# Patient Record
Sex: Male | Born: 1950
Health system: Southern US, Community
[De-identification: ages and names within clinical notes are randomized; demographics above are authoritative.]

## PROBLEM LIST (undated history)

## (undated) DIAGNOSIS — G709 Myoneural disorder, unspecified: Secondary | ICD-10-CM

## (undated) DIAGNOSIS — I493 Ventricular premature depolarization: Secondary | ICD-10-CM

## (undated) DIAGNOSIS — M1712 Unilateral primary osteoarthritis, left knee: Secondary | ICD-10-CM

## (undated) DIAGNOSIS — I10 Essential (primary) hypertension: Secondary | ICD-10-CM

## (undated) DIAGNOSIS — G43109 Migraine with aura, not intractable, without status migrainosus: Secondary | ICD-10-CM

## (undated) DIAGNOSIS — E785 Hyperlipidemia, unspecified: Secondary | ICD-10-CM

## (undated) DIAGNOSIS — G4730 Sleep apnea, unspecified: Secondary | ICD-10-CM

## (undated) DIAGNOSIS — M1990 Unspecified osteoarthritis, unspecified site: Secondary | ICD-10-CM

## (undated) DIAGNOSIS — D649 Anemia, unspecified: Secondary | ICD-10-CM

## (undated) DIAGNOSIS — I639 Cerebral infarction, unspecified: Secondary | ICD-10-CM

## (undated) DIAGNOSIS — M1612 Unilateral primary osteoarthritis, left hip: Secondary | ICD-10-CM

## (undated) DIAGNOSIS — J189 Pneumonia, unspecified organism: Secondary | ICD-10-CM

## (undated) DIAGNOSIS — M109 Gout, unspecified: Secondary | ICD-10-CM

## (undated) DIAGNOSIS — T7840XA Allergy, unspecified, initial encounter: Secondary | ICD-10-CM

## (undated) HISTORY — DX: Allergy, unspecified, initial encounter: T78.40XA

## (undated) HISTORY — PX: OTHER SURGICAL HISTORY: SHX169

## (undated) HISTORY — PX: LOOP RECORDER REMOVAL: EP1215

## (undated) HISTORY — DX: Essential (primary) hypertension: I10

## (undated) HISTORY — DX: Hyperlipidemia, unspecified: E78.5

## (undated) HISTORY — DX: Anemia, unspecified: D64.9

## (undated) HISTORY — PX: JOINT REPLACEMENT: SHX530

## (undated) HISTORY — PX: TONSILLECTOMY AND ADENOIDECTOMY: SUR1326

## (undated) HISTORY — DX: Cerebral infarction, unspecified: I63.9

## (undated) HISTORY — DX: Unspecified osteoarthritis, unspecified site: M19.90

## (undated) HISTORY — PX: WISDOM TOOTH EXTRACTION: SHX21

---

## 1995-05-03 HISTORY — PX: CHOLECYSTECTOMY: SHX55

## 2004-05-02 HISTORY — PX: OTHER SURGICAL HISTORY: SHX169

## 2007-09-28 ENCOUNTER — Ambulatory Visit (HOSPITAL_COMMUNITY): Admission: RE | Admit: 2007-09-28 | Discharge: 2007-09-28 | Payer: Self-pay | Admitting: Cardiology

## 2010-09-14 NOTE — Cardiovascular Report (Signed)
NAME:  Phillip [name], Phillip [name]                ACCOUNT NO.:  [id number]   MEDICAL RECORD NO.:  [id number]          PATIENT TYPE:  OIB   LOCATION:  2899                         FACILITY:  [facility]   PHYSICIAN:  Georga Hacking, M.D.DATE OF BIRTH:  Apr 10, 1951   DATE OF PROCEDURE:  09/28/2007  DATE OF DISCHARGE:                            CARDIAC CATHETERIZATION   HISTORY:  This is a 60 year old male with cardiac risk factors who has a  Cardiolite stress test without ischemia, but with some wide complex  rhythm at peak exercise.   PROCEDURES:  Left heart catheterization with coronary angiograms and  left ventriculogram.   PROCEDURE:  The patient tolerated the procedure well without  complications.  The right femoral artery was entered using a single  anterior needle wall stick and a 6-French sheath was placed.  The 6-  French catheters were used and a 30 mL ventriculogram was performed.  He  tolerated the procedure well and was returned to the holding area for  sheath removal following the procedure.   HEMODYNAMIC DATA:  Aorta postcontrast 116/72 and LV postcontrast 116/9-  16.   ANGIOGRAPHIC DATA:  Left ventriculogram:  Performed in the 30 degrees  RAO projection.  The aortic valve is normal.  The mitral valve is  normal.  The left ventricle appears normal in size.  Estimated ejection  fraction is 60%.  Coronary arteries arise and distribute normally.  No  significant coronary calcification noted, right dominant.  Left main  coronary artery normal.  Left anterior descending:  Mild aneurysmal dilation proximally with mild  10-20% proximal narrowing.  There are scattered narrowings proximally.  The distal vessel tapers somewhat, but contains no significant  obstructive disease.  Supplies a small diagonal branch.  Circumflex coronary artery:  Supplies several marginal branches and is  free of disease.  Right coronary artery:  Dominant vessel with no significant disease.  There are scattered  irregularities noted.   IMPRESSION:  1. Normal left ventricular function.  2. Mild proximal left anterior descending disease.  Scattered      irregularities in the right coronary artery.  No significant      obstructive coronary disease.   PLAN:  Continue risk factor modification followup and also placed on  beta-blocker therapy.      Georga Hacking, M.D.  Electronically Signed     WST/MEDQ  D:  09/28/2007  T:  09/28/2007  Job:  [id number]   cc:   Brett Canales A. Cleta Alberts, M.D.

## 2011-04-12 ENCOUNTER — Ambulatory Visit (INDEPENDENT_AMBULATORY_CARE_PROVIDER_SITE_OTHER): Payer: Commercial Managed Care - PPO

## 2011-04-12 DIAGNOSIS — J111 Influenza due to unidentified influenza virus with other respiratory manifestations: Secondary | ICD-10-CM

## 2011-06-01 ENCOUNTER — Ambulatory Visit (INDEPENDENT_AMBULATORY_CARE_PROVIDER_SITE_OTHER): Payer: Commercial Managed Care - PPO | Admitting: Physician Assistant

## 2011-06-01 VITALS — BP 162/92 | HR 73 | Temp 97.0°F | Resp 16 | Ht 74.0 in | Wt 242.0 lb

## 2011-06-01 DIAGNOSIS — S61409A Unspecified open wound of unspecified hand, initial encounter: Secondary | ICD-10-CM

## 2011-06-01 DIAGNOSIS — E1140 Type 2 diabetes mellitus with diabetic neuropathy, unspecified: Secondary | ICD-10-CM | POA: Insufficient documentation

## 2011-06-01 DIAGNOSIS — G4733 Obstructive sleep apnea (adult) (pediatric): Secondary | ICD-10-CM

## 2011-06-01 DIAGNOSIS — E119 Type 2 diabetes mellitus without complications: Secondary | ICD-10-CM

## 2011-06-01 DIAGNOSIS — M79609 Pain in unspecified limb: Secondary | ICD-10-CM

## 2011-06-01 DIAGNOSIS — I10 Essential (primary) hypertension: Secondary | ICD-10-CM

## 2011-06-01 MED ORDER — AMLODIPINE BESYLATE 5 MG PO TABS: 5.0000 mg | ORAL_TABLET | Freq: Every day | ORAL | Status: DC

## 2011-06-01 NOTE — Progress Notes (Signed)
  Subjective:    Patient ID: Phillip [name], male    DOB: 02-22-1951, 61 y.o.   MRN: [id number]  HPI 61 yo WM cut his left hand this morning cutting a piece of plastic.  He believes he has cut into the joint capsule, but notes he has full range of motion, good strength.  He irrigated the wound well with running water and notes it bled freely.  He presents with the wound wrapped in a clean washcloth.  He is right hand dominant.  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  Additionally, he notes that his BP has been running a little high and thinks his meds should be adjusted.   Review of Systems Wound and pain of the right hand, dorsal surface, over index finger MCP.    Objective:   Physical Exam  Constitutional: He appears well-developed and well-nourished.  HENT:  Head: Normocephalic and atraumatic.  Skin: Laceration noted.       Verbal consent obtained from the patient.  Local anesthesia with 1cc Lidocaine 2% without epinephrine.  Wound scrubbed with soap and water and rinsed.  Wound closed with 55-0 Ethilon SI sutures.  Wound cleansed and dressed.      Assessment & Plan:   1. Pain in limb   2. Open wound, hand   3. Essential hypertension, benign    Local wound care.  See patient instructions.  Continue current medications.  Will not increase beta blocker due to fatigue.  Start amlodipine 5 mg daily.  Monitor readings at home and RTC if BP consistently >140/90.  Patient aware of the beneficial effects of weight loss on hypertension.

## 2011-06-01 NOTE — Patient Instructions (Signed)

## 2011-06-24 ENCOUNTER — Telehealth: Payer: Self-pay

## 2011-06-24 MED ORDER — METOPROLOL TARTRATE 50 MG PO TABS: 75.0000 mg | ORAL_TABLET | Freq: Two times a day (BID) | ORAL | Status: DC

## 2011-06-24 NOTE — Telephone Encounter (Signed)
Called in attached Rx for Dr Alwyn Ren as written by Dr Cleta Alberts to [facility] outpt pharmacy. Dr Cleta Alberts, please sign order for Rx and close encounter

## 2011-06-29 ENCOUNTER — Other Ambulatory Visit: Payer: Self-pay | Admitting: Physician Assistant

## 2011-08-30 ENCOUNTER — Other Ambulatory Visit: Payer: Self-pay | Admitting: Emergency Medicine

## 2011-08-30 ENCOUNTER — Ambulatory Visit (INDEPENDENT_AMBULATORY_CARE_PROVIDER_SITE_OTHER): Payer: Commercial Managed Care - PPO | Admitting: Emergency Medicine

## 2011-08-30 DIAGNOSIS — I10 Essential (primary) hypertension: Secondary | ICD-10-CM

## 2011-08-30 DIAGNOSIS — E119 Type 2 diabetes mellitus without complications: Secondary | ICD-10-CM

## 2011-08-30 LAB — COMPREHENSIVE METABOLIC PANEL
ALT: 25 U/L (ref 0–53)
CO2: 28 mEq/L (ref 19–32)
Calcium: 9.8 mg/dL (ref 8.4–10.5)
Chloride: 100 mEq/L (ref 96–112)
Glucose, Bld: 132 mg/dL — ABNORMAL HIGH (ref 70–99)
Sodium: 138 mEq/L (ref 135–145)
Total Protein: 6.6 g/dL (ref 6.0–8.3)

## 2011-08-30 LAB — CBC WITH DIFFERENTIAL/PLATELET
Eosinophils Relative: 6 % — ABNORMAL HIGH (ref 0–5)
Hemoglobin: 12.8 g/dL — ABNORMAL LOW (ref 13.0–17.0)
Lymphocytes Relative: 34 % (ref 12–46)
Lymphs Abs: 2.7 10*3/uL (ref 0.7–4.0)
MCV: 92.4 fL (ref 78.0–100.0)
Monocytes Relative: 8 % (ref 3–12)
Platelets: 310 10*3/uL (ref 150–400)
RBC: 4.23 MIL/uL (ref 4.22–5.81)
WBC: 7.8 10*3/uL (ref 4.0–10.5)

## 2011-08-30 LAB — LIPID PANEL: Cholesterol: 184 mg/dL (ref 0–200)

## 2011-08-30 NOTE — Progress Notes (Signed)
  Subjective:    Patient ID: Phillip [name], male    DOB: [birth year], 61 y.o.   MRN: [id number]  HPI patient here for labs    Review of Systems     Objective:   Physical Exam        Assessment & Plan:

## 2011-09-05 ENCOUNTER — Ambulatory Visit: Payer: Commercial Managed Care - PPO

## 2011-09-05 ENCOUNTER — Ambulatory Visit (INDEPENDENT_AMBULATORY_CARE_PROVIDER_SITE_OTHER): Payer: Commercial Managed Care - PPO | Admitting: Emergency Medicine

## 2011-09-05 ENCOUNTER — Encounter: Payer: Self-pay | Admitting: Emergency Medicine

## 2011-09-05 VITALS — BP 150/90 | HR 55

## 2011-09-05 DIAGNOSIS — M549 Dorsalgia, unspecified: Secondary | ICD-10-CM

## 2011-09-05 DIAGNOSIS — Z0000 Encounter for general adult medical examination without abnormal findings: Secondary | ICD-10-CM

## 2011-09-05 DIAGNOSIS — M109 Gout, unspecified: Secondary | ICD-10-CM | POA: Insufficient documentation

## 2011-09-05 DIAGNOSIS — M25559 Pain in unspecified hip: Secondary | ICD-10-CM

## 2011-09-05 DIAGNOSIS — E782 Mixed hyperlipidemia: Secondary | ICD-10-CM

## 2011-09-05 DIAGNOSIS — M1990 Unspecified osteoarthritis, unspecified site: Secondary | ICD-10-CM | POA: Insufficient documentation

## 2011-09-05 DIAGNOSIS — M25569 Pain in unspecified knee: Secondary | ICD-10-CM

## 2011-09-05 DIAGNOSIS — M545 Low back pain: Secondary | ICD-10-CM

## 2011-09-05 DIAGNOSIS — E119 Type 2 diabetes mellitus without complications: Secondary | ICD-10-CM

## 2011-09-05 DIAGNOSIS — Z9989 Dependence on other enabling machines and devices: Secondary | ICD-10-CM

## 2011-09-05 DIAGNOSIS — R0602 Shortness of breath: Secondary | ICD-10-CM

## 2011-09-05 DIAGNOSIS — E781 Pure hyperglyceridemia: Secondary | ICD-10-CM | POA: Insufficient documentation

## 2011-09-05 DIAGNOSIS — R9431 Abnormal electrocardiogram [ECG] [EKG]: Secondary | ICD-10-CM

## 2011-09-05 DIAGNOSIS — E785 Hyperlipidemia, unspecified: Secondary | ICD-10-CM

## 2011-09-05 DIAGNOSIS — I10 Essential (primary) hypertension: Secondary | ICD-10-CM

## 2011-09-05 DIAGNOSIS — I493 Ventricular premature depolarization: Secondary | ICD-10-CM

## 2011-09-05 LAB — URIC ACID: Uric Acid, Serum: 5.5 mg/dL (ref 4.0–7.8)

## 2011-09-05 MED ORDER — FOLIC ACID 1 MG PO TABS: ORAL_TABLET | ORAL | Status: DC

## 2011-09-05 MED ORDER — ZOLPIDEM TARTRATE 5 MG PO TABS: 5.0000 mg | ORAL_TABLET | Freq: Every evening | ORAL | Status: DC | PRN

## 2011-09-05 MED ORDER — LOSARTAN POTASSIUM-HCTZ 100-12.5 MG PO TABS: 1.0000 | ORAL_TABLET | Freq: Every day | ORAL | Status: DC

## 2011-09-05 MED ORDER — ALLOPURINOL 300 MG PO TABS: 300.0000 mg | ORAL_TABLET | Freq: Every day | ORAL | Status: DC

## 2011-09-05 MED ORDER — METFORMIN HCL 1000 MG PO TABS: 1000.0000 mg | ORAL_TABLET | Freq: Two times a day (BID) | ORAL | Status: DC

## 2011-09-05 MED ORDER — METOPROLOL TARTRATE 50 MG PO TABS: 75.0000 mg | ORAL_TABLET | Freq: Two times a day (BID) | ORAL | Status: DC

## 2011-09-05 NOTE — Progress Notes (Signed)
  Subjective:    Patient ID: Phillip [name], male    DOB: August 14, 1950, 61 y.o.   MRN: [id number]  HPI patient enters with a number of issues he would like to address. Sugars have been ranging 120 to 130. To 190 he had an A1c done which was 6.4. He is working on weight loss but has been unable to exercise due to knee problems. His hypertension has been variable maximums 146/86. These have been worse since stopping amlodipine which was stopped due to gynecomastia. His gout has occasional flareups usually associated with dietary changes. He's had some major issues with his joints. He has persistent pain in his back as well as significant limited motion in his hips and left knee. Which has been giving him some discomfort. He has decreased sensation on the bottoms of both feet he is unsure whether this may be due to his back disease or whether this is due to his diabetes. He recently has had difficulty with gynecomastia. This has been presented to be secondary to medications.    Review of Systems are as listed in history of present illness     Objective:   Physical Exam  Constitutional: He is oriented to person, place, and time. He appears well-developed and well-nourished.  HENT:  Head: Normocephalic and atraumatic.  Right Ear: External ear normal.  Left Ear: External ear normal.  Eyes: Pupils are equal, round, and reactive to light.  Neck: Normal range of motion. Neck supple. No thyromegaly present.  Cardiovascular: Normal rate, regular rhythm, normal heart sounds and intact distal pulses.  Exam reveals no gallop and no friction rub.   No murmur heard. Pulmonary/Chest: No respiratory distress. He has no wheezes. He has no rales. He exhibits no tenderness.  Abdominal: Soft. Bowel sounds are normal. He exhibits no distension and no mass. There is no tenderness. There is no rebound and no guarding.  Musculoskeletal:       There is decreased range of motion of both hips. There is significant lack of  internal rotation. There are degenerative changes of the left knee. There is grinding present with McMurray testing but no locking and no instability is noted.  Neurological: He is alert and oriented to person, place, and time. He displays normal reflexes. No cranial nerve deficit. Coordination normal.       Decreased deep tendon reflexes both ankles no reflex right ankle trace left   Examination of feet revealed decreased sensation in the left foot compared to the right. There are hammertoe deformities bilaterally  EKG Q waves 3 and avf. No change [facility] reading (PRIMARY) by  Dr. Cleta Alberts Old comp l1 Deg changes. ? Mild spondylolisthesis L3 L4  Deg. Changes hips and knee. .     Assessment & Plan:  Assessment is diabetes under relatively good control. Blood pressure not under as good control as I would like to recent stoppage of amlodipine due to gynecomastia. We'll try folic acid to see if this helps with the neuropathy. Films ordered of the back hips

## 2011-10-03 ENCOUNTER — Encounter (INDEPENDENT_AMBULATORY_CARE_PROVIDER_SITE_OTHER): Payer: Self-pay | Admitting: General Surgery

## 2011-10-03 ENCOUNTER — Ambulatory Visit (INDEPENDENT_AMBULATORY_CARE_PROVIDER_SITE_OTHER): Payer: Commercial Managed Care - PPO | Admitting: General Surgery

## 2011-10-03 VITALS — BP 160/100 | HR 60 | Temp 97.6°F | Resp 14 | Ht 72.0 in | Wt 241.2 lb

## 2011-10-03 DIAGNOSIS — N644 Mastodynia: Secondary | ICD-10-CM

## 2011-10-03 NOTE — Progress Notes (Signed)
Subjective:     Patient ID: Phillip [name], male   DOB: July 14, 1950, 61 y.o.   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 He was on a trip to [place] where he wore his seatbelt and thought at that point we just related to that. He has a history of gynecomastia right side greater than left side. He was on amlodipine which he stopped about a month ago and there is been no improvement since then. He's had no discharge. He does not really feel a discrete mass. There is a point of tenderness just below his right nipple. This is been getting more tender lately. He comes in for evaluation.  Review of Systems  Constitutional: Negative for fever, chills and unexpected weight change.  HENT: Negative for hearing loss, congestion, sore throat, trouble swallowing and voice change.   Eyes: Negative for visual disturbance.  Respiratory: Negative for cough and wheezing.   Cardiovascular: Negative for chest pain, palpitations and leg swelling.  Gastrointestinal: Negative for nausea, vomiting, abdominal pain, diarrhea, constipation, blood in stool, abdominal distention, anal bleeding and rectal pain.  Genitourinary: Negative for hematuria and difficulty urinating.  Musculoskeletal: Negative for arthralgias.  Skin: Negative for rash and wound.  Neurological: Negative for seizures, syncope, weakness and headaches.  Hematological: Negative for adenopathy. Does not bruise/bleed easily.  Psychiatric/Behavioral: Negative for confusion.       Objective:   Physical Exam  Vitals reviewed. Constitutional: He appears well-developed and well-nourished.  Pulmonary/Chest: Right breast exhibits tenderness. Right breast exhibits no inverted nipple, no mass, no nipple discharge and no skin change. Left breast exhibits no inverted nipple, no mass, no nipple discharge, no skin change and no tenderness.    Lymphadenopathy:    He has no  axillary adenopathy.       Assessment:     Likely gynecomastia    Plan:        I think this is likely gynecomastia. He has however developed some symptoms referable to the right breast recently. There is a point of tenderness as well as a fair amount of breast tissue that is present and is not a subareolar in nature. I think with his symptoms the next best step is going to proceed with an ultrasound and I will follow up with him after that.

## 2011-10-06 ENCOUNTER — Other Ambulatory Visit: Payer: Self-pay | Admitting: Family Medicine

## 2011-10-06 MED ORDER — METOPROLOL TARTRATE 100 MG PO TABS: 100.0000 mg | ORAL_TABLET | Freq: Two times a day (BID) | ORAL | Status: DC

## 2011-10-10 ENCOUNTER — Ambulatory Visit
Admission: RE | Admit: 2011-10-10 | Discharge: 2011-10-10 | Disposition: A | Discharge status: Home / Self Care | Payer: 59 | Source: Ambulatory Visit | Attending: General Surgery | Admitting: General Surgery

## 2011-10-10 DIAGNOSIS — N644 Mastodynia: Secondary | ICD-10-CM

## 2011-10-11 ENCOUNTER — Other Ambulatory Visit: Payer: Self-pay | Admitting: Cardiology

## 2011-10-11 NOTE — Progress Notes (Signed)
Phillip [name]    Date of visit:  10/11/2011 DOB:  1950-12-06    Age:  60 yrs. Medical record number:  [id number]     Account number:  [id number] Primary Care Provider: Lesle Chris A ____________________________ CURRENT DIAGNOSES  1. CAD,Native  2. Obesity(BMI30-40)  3. Hypertension,Essential (Benign)  4. Arrhythmia-Ventricular Tachycardia  5. Sleep apnea  6. Diabetes Mellitus-NIDD ____________________________ ALLERGIES  Sulfonamides ____________________________ MEDICATIONS  1. aspirin 81 mg tablet, effervescent, 1 p.o. daily  2. ibuprofen 200 mg tablet, PRN  3. allopurinol 300 mg tablet, 1 p.o. daily  4. metformin 1,000 mg tablet, BID  5. metoprolol tartrate 100 mg tablet, BID  6. losartan-hydrochlorothiazide 100-12.5 mg tablet, 1 p.o. daily ____________________________ CHIEF COMPLAINTS  Followup of CAD,Native ____________________________ HISTORY OF PRESENT ILLNESS  Patient seen for cardiac followup. He returns after a long absence. When I last saw him he had a run of nonsustained ventricular tachycardia on treadmill testing and his beta blocker was increased. He never returned for a treadmill but has been doing well. 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 He denies angina and tries to walk about 2 miles per day. He denies PND, orthopnea, syncope, or claudication. He has had some mild edema as well as some burning in his feet and was taken off of amlodipine. He also complains of aching all over particularly with his neck as well as his knee and is no longer taking statin therapy because of the aching which he says is definitely worse. His diabetes has been under fair control. Blood pressure is borderline.  He continues to wear CPAP for sleep apnea. He uses ibuprofen intermittently for arthritis. ____________________________ PAST HISTORY  Past Medical Illnesses:  hypertension, DM-non-insulin dependent, obesity, history of migraine headaches, E. Coli  sepsis 1998 with acute cholecystitis, sleep apnea, gout;  Cardiovascular Illnesses:  RBBB, CAD;  Surgical Procedures:  cholecystectomy (lap), knee surgery, left, tonsillectomy, vasectomy;  Cardiology Procedures-Invasive:  no history of prior cardiac procedures;  Cardiology Procedures-Noninvasive:  stress echocardiogram, treadmill cardiolite May 2009, treadmill January 2010;  Cardiac Cath Results:  normal Left main, ecctatic proximal LAD, 20 % stenosis proximal LAD, normal CFX, scattered irregularities RCA;  ____________________________ CARDIO-PULMONARY TEST DATES EKG Date:  10/11/2011;   Cardiac Cath Date:  09/30/2007;  Nuclear Study Date:  09/12/2007;  Chest Xray Date: 09/20/2007;   ____________________________ SOCIAL HISTORY Alcohol Use:  no alcohol use;  Smoking:  never smoked;  Diet:  regular diet;  Lifestyle:  married and 4 children;  Exercise:  walking for approximately 45 minutes 6 days per week;  Occupation:  physician;  Residence:  lives with wife;   ____________________________ REVIEW OF SYSTEMS General:  obesity  Eyes:  wears eye glasses/contact lenses  Respiratory:  denies dyspnea  Cardiovascular:  please review HPI  Abdominal:  denies dyspepsia, GI bleeding, constipation, or diarrhea  Musculoskeletal:  mild arthritis of the knees, aching, neck pain  Neurological:  occasional migraine headaches ____________________________ PHYSICAL EXAMINATION VITAL SIGNS  Blood Pressure:  140/80 Sitting, Right arm, regular cuff  , 136/80 Standing, Right arm and regular cuff   Pulse:  74/min. Weight:  241.00 lbs. Height:  73"BMI: 32  Constitutional:  pleasant white male in no acute distress, mildly obese Skin:  warm and dry to touch, no apparent skin lesions, or masses noted. Head:  normocephalic, normal hair pattern, no masses or tenderness ENT:  ears, nose and throat reveal no gross abnormalities.  Dentition good. Neck:  supple, without massess. No JVD,  thyromegaly or carotid bruits. Carotid  upstroke normal. Chest:  normal symmetry, clear to auscultation and percussion. Cardiac:  regular rhythm, normal S1 and S2, No S3 or S4, no murmurs, gallops or rubs detected. Peripheral Pulses:  the femoral,dorsalis pedis, and posterior tibial pulses are full and equal bilaterally with no bruits auscultated. Extremities & Back:  no deformities, clubbing, cyanosis, erythema or edema observed. Normal muscle strength and tone. Neurological:  no gross motor or sensory deficits noted, affect appropriate, oriented x3. ____________________________ MOST RECENT LIPID PANEL 08/31/11  CHOL TOTL 184 mg/dl, LDL 85 calc, HDL 36 mg/dl, TRIGLYCER 161 mg/dl and CHOL/HDL 5.1 (Calc) ____________________________ IMPRESSIONS/PLAN  1. Mild coronary artery disease 2. History of nonsustained ventricular tachycardia suppressed on beta blocker therapy 3. Hypertension borderline control 4. Obesity with need to lose weight 5. Hyperlipidemia with statin intolerance  Recommendations:  We discussed need for carbohydrate restriction and additional weight loss. Suggested a repeat treadmill test to see whether he still has any evidence of wide complex tachycardia on an increased dose of beta blockers. Also gave samples of Trilipix to see if he would be effective control and hyperlipidemia without causing aching. He will have this followed up after he has beeno n it 6 weeks.EKG was normal today except for a left axis deviation. ____________________________ Christianne Dolin  1. 12 Lead EKG: Today  2. treadmill:  Regular TM At [facility]                       ____________________________ Cardiology Physician:  Darden Palmer MD [facility]

## 2011-10-12 ENCOUNTER — Telehealth (INDEPENDENT_AMBULATORY_CARE_PROVIDER_SITE_OTHER): Payer: Self-pay

## 2011-10-12 NOTE — Telephone Encounter (Signed)
LMOM for pt to call me so I can go over mgm results.

## 2011-10-13 ENCOUNTER — Telehealth (INDEPENDENT_AMBULATORY_CARE_PROVIDER_SITE_OTHER): Payer: Self-pay | Admitting: General Surgery

## 2011-10-13 NOTE — Telephone Encounter (Signed)
Patient aware test showed benign gynecomastia. Requesting a call from Dr Dwain Sarna to discuss. Declined appt.

## 2011-10-13 NOTE — Telephone Encounter (Signed)
Message copied by Liliana Cline on Thu Oct 13, 2011 11:43 AM ------      Message from: [place], [place]      Created: Tue Oct 11, 2011  9:56 PM       Elease Hashimoto,      I am happy to see him back if he wants to go over this or can just all him.  Let me know.  This all looks like benign gynecomastia.      Matt

## 2011-10-17 ENCOUNTER — Ambulatory Visit (INDEPENDENT_AMBULATORY_CARE_PROVIDER_SITE_OTHER): Payer: Commercial Managed Care - PPO | Admitting: Physician Assistant

## 2011-10-17 DIAGNOSIS — J029 Acute pharyngitis, unspecified: Secondary | ICD-10-CM

## 2011-10-17 NOTE — Progress Notes (Signed)
Patient ID: Phillip [name] MRN: [id number], DOB: 10/17/1950, 61 y.o. Date of Encounter: 10/17/2011, 2:29 PM  Primary Physician: Lucilla Edin, MD  Chief Complaint: No chief complaint on file.   HPI: 61 y.o. year old male presents with a one day history of sore throat. Sore throat has developed since being at work today. Feels that there may be some post nasal drip as well contributing to his sore throat. No congestion or cough. Multiple sick contacts at work. Able to swallow saliva, but hurts to do so. Decreased appetite secondary to sore throat.   Past Medical History  Diagnosis Date  . Anemia   . Arthritis   . Diabetes mellitus   . Hyperlipidemia   . Hypertension      Home Meds: Prior to Admission medications   Medication Sig Start Date End Date Taking? Authorizing Provider  acetaminophen (TYLENOL) 500 MG tablet Take 500 mg by mouth 1 day or 1 dose. 06/01/11   Historical Provider, MD  allopurinol (ZYLOPRIM) 300 MG tablet Take 1 tablet (300 mg total) by mouth daily. 09/05/11   Collene Gobble, MD  amLODipine (NORVASC) 5 MG tablet Take 1 tablet (5 mg total) by mouth daily. 06/01/11 05/31/12  Chelle S Jeffery, PA-C  aspirin 81 MG tablet Take 160 mg by mouth daily. 06/01/11   Historical Provider, MD  folic acid (FOLVITE) 1 MG tablet Take 2 tablets daily 09/05/11   Collene Gobble, MD  losartan-hydrochlorothiazide (HYZAAR) 100-12.5 MG per tablet Take 1 tablet by mouth daily. 09/05/11   Collene Gobble, MD  metFORMIN (GLUCOPHAGE) 1000 MG tablet Take 1 tablet (1,000 mg total) by mouth 2 (two) times daily with a meal. 09/05/11   Collene Gobble, MD  metoprolol (LOPRESSOR) 100 MG tablet Take 1 tablet (100 mg total) by mouth 2 (two) times daily. 10/06/11 10/05/12  Pattricia Boss, PA-C  zolpidem (AMBIEN) 5 MG tablet Take 1 tablet (5 mg total) by mouth at bedtime as needed. 09/05/11   Collene Gobble, MD    Allergies:  Allergies  Allergen Reactions  . Sulfa Antibiotics     History   Social History  .  Marital Status: Married    Spouse Name: N/A    Number of Children: N/A  . Years of Education: N/A   Occupational History  . Not on file.   Social History Main Topics  . Smoking status: Never Smoker   . Smokeless tobacco: Not on file  . Alcohol Use: No  . Drug Use: No  . Sexually Active: Not on file   Other Topics Concern  . Not on file   Social History Narrative  . No narrative on file      Physical Exam: There were no vitals taken for this visit., There is no height or weight on file to calculate BMI. General: Well developed, well nourished, in no acute distress. Head: Normocephalic, atraumatic, eyes without discharge, sclera non-icteric, nares are patent. Oral cavity moist, dentition normal. Posterior pharynx with post nasal drip and mild erythema. No peritonsillar abscess or tonsillar exudate. Neck: Supple. No thyromegaly. Full ROM. <2cm submandibular. Lungs: Clear bilaterally to auscultation without wheezes, rales, or rhonchi. Breathing is unlabored. Heart: RRR with S1 S2. No murmurs, rubs, or gallops appreciated. Msk:  Strength and tone normal for age. Extremities: No clubbing or cyanosis. No edema. Neuro: Alert and oriented X 3. Moves all extremities spontaneously. CNII-XII grossly in tact. Psych:  Responds to questions appropriately with a normal affect.  Labs: Results for orders placed in visit on 10/17/11  POCT RAPID STREP A (OFFICE)      Component Value Range   Rapid Strep A Screen Negative  Negative     ASSESSMENT AND PLAN:  61 y.o. year old male with viral pharyngitis -Supportive care -Tylenol/Motrin prn -Rest/fluids -RTC precautions -RTC 3-5 days if no improvement  SignedEula Listen, PA-C 10/17/2011 2:29 PM

## 2011-12-29 ENCOUNTER — Other Ambulatory Visit: Payer: Self-pay | Admitting: Family Medicine

## 2011-12-29 MED ORDER — METFORMIN HCL 1000 MG PO TABS: 1000.0000 mg | ORAL_TABLET | Freq: Two times a day (BID) | ORAL | Status: DC

## 2012-02-01 ENCOUNTER — Ambulatory Visit (INDEPENDENT_AMBULATORY_CARE_PROVIDER_SITE_OTHER): Payer: 59 | Admitting: Emergency Medicine

## 2012-02-01 VITALS — BP 156/93 | HR 64 | Temp 97.8°F | Resp 16

## 2012-02-01 DIAGNOSIS — I10 Essential (primary) hypertension: Secondary | ICD-10-CM

## 2012-02-01 DIAGNOSIS — G43909 Migraine, unspecified, not intractable, without status migrainosus: Secondary | ICD-10-CM

## 2012-02-01 DIAGNOSIS — Z23 Encounter for immunization: Secondary | ICD-10-CM

## 2012-02-01 DIAGNOSIS — E785 Hyperlipidemia, unspecified: Secondary | ICD-10-CM

## 2012-02-01 DIAGNOSIS — N529 Male erectile dysfunction, unspecified: Secondary | ICD-10-CM

## 2012-02-01 DIAGNOSIS — E119 Type 2 diabetes mellitus without complications: Secondary | ICD-10-CM

## 2012-02-01 LAB — COMPREHENSIVE METABOLIC PANEL
CO2: 28 mEq/L (ref 19–32)
Calcium: 10.1 mg/dL (ref 8.4–10.5)
Chloride: 98 mEq/L (ref 96–112)
Creat: 1.14 mg/dL (ref 0.50–1.35)
Glucose, Bld: 129 mg/dL — ABNORMAL HIGH (ref 70–99)
Sodium: 135 mEq/L (ref 135–145)
Total Bilirubin: 0.5 mg/dL (ref 0.3–1.2)
Total Protein: 7.4 g/dL (ref 6.0–8.3)

## 2012-02-01 LAB — LIPID PANEL
HDL: 42 mg/dL (ref >39)
Triglycerides: 171 mg/dL — ABNORMAL HIGH (ref <150)

## 2012-02-01 MED ORDER — CEFDINIR 300 MG PO CAPS: 600.0000 mg | ORAL_CAPSULE | Freq: Every day | ORAL | Status: DC

## 2012-02-01 MED ORDER — FLUTICASONE PROPIONATE 50 MCG/ACT NA SUSP: 2.0000 | Freq: Every day | NASAL | Status: DC

## 2012-02-01 MED ORDER — VARDENAFIL HCL 20 MG PO TABS: 20.0000 mg | ORAL_TABLET | Freq: Every day | ORAL | Status: DC | PRN

## 2012-02-01 NOTE — Progress Notes (Signed)
  Subjective:    Patient ID: Phillip [name], male    DOB: 08-17-1950, 61 y.o.   MRN: [id number]  HPI patient enters with chief complaint of followup of his diabetes. His sugars have been pretty reasonable in the morning. He has not been exercising much but has been trying to stick to his diet. He'll then see Dr. Carmelina Dane undergone a repeat stress test which did not reveal any tachycardia or PVCs when his heart rate increased . He has a history of migraines and has had intermittent visual migraines with all but no severe headaches. He's tried Maxalt with improvement recently. He recently has had some issues with ED and would like to try medications to see if that would help. He has had only recent rare flares of gout and takes an occasional nonsteroidal for this. He has had a dry cough but has had no fevers, no chills, no hemoptysis, no weight loss, no chills.    Review of Systems     Objective:   Physical Exam HEENT exam is unremarkable. Neck is supple. Chest clear to auscultation and percussion. Cardiac exam is unremarkable. Examination of feet revealed normal dorsalis pedis and posterior tibial.. sensation is intact        Results for orders placed in visit on 02/01/12  POCT GLYCOSYLATED HEMOGLOBIN (HGB A1C)      Component Value Range   Hemoglobin A1C 7.1     Assessment & Plan:  Hemoglobin A1c is slightly up from previous. We'll then check his labs including uric acid as well as testosterone levels. He was given his prescription for #3  20 mg Levitra to use as a trial if he continues to have cough with production we'll treat with Omnicef 300 twice a day.

## 2012-02-02 ENCOUNTER — Other Ambulatory Visit: Payer: Self-pay | Admitting: Emergency Medicine

## 2012-02-02 DIAGNOSIS — E781 Pure hyperglyceridemia: Secondary | ICD-10-CM

## 2012-02-02 LAB — MICROALBUMIN, URINE: Microalb, Ur: 0.5 mg/dL (ref 0.00–1.89)

## 2012-02-02 LAB — TESTOSTERONE, FREE, TOTAL, SHBG
Testosterone-% Free: 2.3 % (ref 1.6–2.9)
Testosterone: 225.99 ng/dL — ABNORMAL LOW (ref 300–890)

## 2012-02-02 MED ORDER — CHOLINE FENOFIBRATE 135 MG PO CPDR: 135.0000 mg | DELAYED_RELEASE_CAPSULE | Freq: Every day | ORAL | Status: DC

## 2012-03-08 ENCOUNTER — Telehealth: Payer: Self-pay | Admitting: Family Medicine

## 2012-03-08 DIAGNOSIS — B029 Zoster without complications: Secondary | ICD-10-CM

## 2012-03-08 MED ORDER — VALACYCLOVIR HCL 1 G PO TABS: 1000.0000 mg | ORAL_TABLET | Freq: Three times a day (TID) | ORAL | Status: DC

## 2012-03-08 NOTE — Telephone Encounter (Signed)
Phillip [name] developed shingles while in [place] a few days ago.  He had started an antiviral which was on hand but asked me to call in some valtrex.  Called in valtrex 1 gm TID for one week.

## 2012-04-06 ENCOUNTER — Other Ambulatory Visit: Payer: Self-pay | Admitting: Emergency Medicine

## 2012-04-06 ENCOUNTER — Other Ambulatory Visit: Payer: Self-pay | Admitting: Physician Assistant

## 2012-04-07 NOTE — Telephone Encounter (Signed)
Okay to refill Ambien 5 mg #90 with one refill.

## 2012-04-16 ENCOUNTER — Other Ambulatory Visit: Payer: Self-pay | Admitting: Radiology

## 2012-04-16 NOTE — Telephone Encounter (Signed)
Please advise on patients Ambien refill. Pended Rx

## 2012-04-17 ENCOUNTER — Other Ambulatory Visit: Payer: Self-pay | Admitting: Radiology

## 2012-04-17 MED ORDER — ZOLPIDEM TARTRATE 5 MG PO TABS: 5.0000 mg | ORAL_TABLET | Freq: Every evening | ORAL | Status: DC | PRN

## 2012-04-19 ENCOUNTER — Telehealth: Payer: Self-pay | Admitting: *Deleted

## 2012-04-19 NOTE — Telephone Encounter (Signed)
Pharmacy requesting refill on Ambien 5mg . Last refill on 01/26/2012.

## 2012-04-19 NOTE — Telephone Encounter (Signed)
Okay to refill Ambien for 6 months. We can give him a prescription for #90 with one refill

## 2012-04-19 NOTE — Telephone Encounter (Signed)
This has already been done.

## 2012-04-24 ENCOUNTER — Telehealth: Payer: Self-pay | Admitting: Physician Assistant

## 2012-04-24 MED ORDER — VARDENAFIL HCL 20 MG PO TABS: 20.0000 mg | ORAL_TABLET | Freq: Every day | ORAL | Status: DC | PRN

## 2012-04-24 NOTE — Telephone Encounter (Signed)
Has tried samples of Viagra and would like to try Levitra.  Dr. Cleta Alberts had given him an Rx previously, but he's lost it.  Sent Rx for Levitra 20 mg 1/2-1 PO QD prn, #6, RF x 3 (quantity specified by the patient).

## 2012-05-04 ENCOUNTER — Other Ambulatory Visit: Payer: Self-pay | Admitting: Physician Assistant

## 2012-05-04 MED ORDER — ALLOPURINOL 300 MG PO TABS: 300.0000 mg | ORAL_TABLET | Freq: Every day | ORAL | Status: DC

## 2012-05-25 ENCOUNTER — Ambulatory Visit (INDEPENDENT_AMBULATORY_CARE_PROVIDER_SITE_OTHER): Payer: 59 | Admitting: Emergency Medicine

## 2012-05-25 VITALS — BP 187/98 | HR 60 | Temp 97.7°F | Resp 16 | Ht 72.0 in | Wt 238.0 lb

## 2012-05-25 DIAGNOSIS — M1610 Unilateral primary osteoarthritis, unspecified hip: Secondary | ICD-10-CM

## 2012-05-25 DIAGNOSIS — E7800 Pure hypercholesterolemia, unspecified: Secondary | ICD-10-CM

## 2012-05-25 DIAGNOSIS — E119 Type 2 diabetes mellitus without complications: Secondary | ICD-10-CM

## 2012-05-25 DIAGNOSIS — I10 Essential (primary) hypertension: Secondary | ICD-10-CM

## 2012-05-25 DIAGNOSIS — M25569 Pain in unspecified knee: Secondary | ICD-10-CM

## 2012-05-25 LAB — COMPREHENSIVE METABOLIC PANEL
ALT: 23 U/L (ref 0–53)
AST: 18 U/L (ref 0–37)
BUN: 28 mg/dL — ABNORMAL HIGH (ref 6–23)
CO2: 26 mEq/L (ref 19–32)
Creat: 1.13 mg/dL (ref 0.50–1.35)
Total Bilirubin: 0.5 mg/dL (ref 0.3–1.2)

## 2012-05-25 LAB — POCT CBC
MCH, POC: 29.8 pg (ref 27–31.2)
MID (cbc): 0.5 (ref 0–0.9)
MPV: 8.6 fL (ref 0–99.8)
POC LYMPH PERCENT: 35.3 %L (ref 10–50)
POC MID %: 8.3 %M (ref 0–12)
Platelet Count, POC: 374 10*3/uL (ref 142–424)
RBC: 4.57 M/uL — AB (ref 4.69–6.13)
RDW, POC: 12.7 %
WBC: 6 10*3/uL (ref 4.6–10.2)

## 2012-05-25 LAB — LIPID PANEL
HDL: 40 mg/dL (ref >39)
LDL Cholesterol: 74 mg/dL (ref 0–99)
Triglycerides: 130 mg/dL (ref <150)
VLDL: 26 mg/dL (ref 0–40)

## 2012-05-25 MED ORDER — SAXAGLIPTIN HCL 5 MG PO TABS: 5.0000 mg | ORAL_TABLET | Freq: Every day | ORAL | Status: DC

## 2012-05-25 MED ORDER — LOSARTAN POTASSIUM-HCTZ 100-25 MG PO TABS: 1.0000 | ORAL_TABLET | Freq: Every day | ORAL | Status: DC

## 2012-05-25 NOTE — Progress Notes (Signed)
  Subjective:    Patient ID: NHIA HEAPHY, male    DOB: [birth year], 62 y.o.   MRN: [id number]  HPI patient in for followup of his diabetes. His blood pressure has also been elevated recently. He has had significant pain in his left hip and left knee. He has not followed through with his colonoscopy yet. He has not had any recent cardiac evaluations in the is a current patient of Dr. Tawana Scale    Review of Systems     Objective:   Physical Exam HEENT exam is unremarkable. Neck is supple. There are no carotid bruits. There are prominent bilateral submandibular glands. His chest is clear. Cardiac exam  no murmurs rubs or gallops. He does have 2-3 skip beats per minute. Abdomen is soft nontender. Extremity exam reveals mild decreased sensation to the toes dorsalis pedis posterior tibial pulses are 2+. Musculoskeletal exam reveals limited internal rotation right hip but extremely limited internal rotation of the left hip. There are also significant degenerative changes around the left knee.   Results for orders placed in visit on 05/25/12  POCT GLYCOSYLATED HEMOGLOBIN (HGB A1C)      Component Value Range   Hemoglobin A1C 7.6    POCT CBC      Component Value Range   WBC 6.0  4.6 - 10.2 K/uL   Lymph, poc 2.1  0.6 - 3.4   POC LYMPH PERCENT 35.3  10 - 50 %L   MID (cbc) 0.5  0 - 0.9   POC MID % 8.3  0 - 12 %M   POC Granulocyte 3.4  2 - 6.9   Granulocyte percent 56.4  37 - 80 %G   RBC 4.57 (*) 4.69 - 6.13 M/uL   Hemoglobin 13.6 (*) 14.1 - 18.1 g/dL   HCT, POC 40.9 (*) 81.1 - 53.7 %   MCV 94.1  80 - 97 fL   MCH, POC 29.8  27 - 31.2 pg   MCHC 31.6 (*) 31.8 - 35.4 g/dL   RDW, POC 91.4     Platelet Count, POC 374  142 - 424 K/uL   MPV 8.6  0 - 99.8 fL  GLUCOSE, POCT (MANUAL RESULT ENTRY)      Component Value Range   POC Glucose 164 (*) 70 - 99 mg/dl   Repeat blood pressure 154/92 left arm 156/90 right arm seated    Assessment & Plan:  The patient is not at goal we'll check with Dr. Arlyn Leak  about next step. Referral made to Dr. Thomasena Edis regarding a very limited motion in his left hip with pain in his left knee. He is going to schedule his own appointment for colonoscopy. Head and angularis 05 mg one a day to his metformin to see if we can get better diabetes control.

## 2012-06-24 ENCOUNTER — Other Ambulatory Visit: Payer: Self-pay | Admitting: *Deleted

## 2012-06-24 MED ORDER — SAXAGLIPTIN HCL 5 MG PO TABS: 5.0000 mg | ORAL_TABLET | Freq: Every day | ORAL | Status: DC

## 2012-07-18 ENCOUNTER — Encounter: Payer: Self-pay | Admitting: Emergency Medicine

## 2012-09-12 ENCOUNTER — Telehealth: Payer: Self-pay | Admitting: Family Medicine

## 2012-09-12 MED ORDER — TYPHOID VACCINE PO CPDR: DELAYED_RELEASE_CAPSULE | ORAL | Status: DC

## 2012-09-12 NOTE — Telephone Encounter (Signed)
Per Dr. Milus Glazier send in RX for Vivotif for overseas travel.

## 2012-09-18 ENCOUNTER — Other Ambulatory Visit: Payer: Self-pay | Admitting: Physician Assistant

## 2012-09-18 MED ORDER — DOXYCYCLINE HYCLATE 100 MG PO TABS: 100.0000 mg | ORAL_TABLET | Freq: Two times a day (BID) | ORAL | Status: DC

## 2012-09-18 NOTE — Telephone Encounter (Signed)
Pt called for refill on Metoprolol and Doxycycline for Malaria. Ok to send in per Dr Perrin Maltese.

## 2012-10-15 ENCOUNTER — Other Ambulatory Visit: Payer: Self-pay | Admitting: Family Medicine

## 2012-10-15 DIAGNOSIS — E785 Hyperlipidemia, unspecified: Secondary | ICD-10-CM

## 2012-10-15 DIAGNOSIS — Z79899 Other long term (current) drug therapy: Secondary | ICD-10-CM

## 2012-10-15 DIAGNOSIS — E119 Type 2 diabetes mellitus without complications: Secondary | ICD-10-CM

## 2012-10-16 ENCOUNTER — Encounter: Payer: Self-pay | Admitting: Cardiology

## 2012-10-16 NOTE — Progress Notes (Signed)
Patient ID: Phillip [name], male   DOB: [birth year], 62 y.o.   MRN: [id number]   Farhan, Jean    Date of visit:  10/15/2012 DOB:  [birth year]    Age:  62 yrs. Medical record number:  [id number]     Account number:  [id number] Primary Care Provider: Lesle Chris A ____________________________ CURRENT DIAGNOSES  1. CAD,Native  2. Obesity(BMI30-40)  3. Hypertension,Essential (Benign)  4. Diabetes Mellitus-NIDD  5. Arrhythmia-Ventricular Tachycardia  6. Palpitations  7. Sleep apnea ____________________________ ALLERGIES  Sulfa (Sulfonamides), Intolerance-unknown ____________________________ MEDICATIONS  1. ibuprofen 200 mg tablet, PRN  2. allopurinol 300 mg tablet, 1 p.o. daily  3. metformin 1,000 mg tablet, BID  4. metoprolol tartrate 100 mg tablet, BID  5. losartan-hydrochlorothiazide 100-12.5 mg tablet, 1 p.o. daily  6. Onglyza 5 mg tablet, 1 p.o. daily  7. doxycycline hyclate 100 mg tablet, 1 p.o. daily  8. fenofibrate 160 mg tablet, 1 p.o. daily  9. aspirin 81 mg tablet,chewable, 1 p.o. daily ____________________________ HISTORY OF PRESENT ILLNESS  Patient seen for cardiac followup. He is due to have left hip surgery as well as partial left knee replacement coming up. He has difficulty with range of movement in his hipas well as significant knee pain. He has noted more frequent PVCs recently and is aware of palpitations. He does not have any angina and denies PND, orthopnea, or claudication. He has not had lab work checked recently. He has been able to lose some weight since he was here. He wanted to have preoperative cardiac evaluation. He had a treadmill test last August and went 7 minutes and 40 seconds into Bruce stage III without ischemic changes and occasional PVCs and couplets were noted but no ventricular tachycardia was noted. ____________________________ PAST HISTORY  Past Medical Illnesses:  hypertension, DM-non-insulin dependent, obesity, history of migraine headaches, E. Coli  sepsis 1998 with acute cholecystitis, sleep apnea, gout;  Cardiovascular Illnesses:  RBBB, CAD;  Surgical Procedures:  cholecystectomy (lap), knee surgery, left, tonsillectomy, vasectomy;  Cardiology Procedures-Invasive:  no history of prior cardiac procedures;  Cardiology Procedures-Noninvasive:  stress echocardiogram, treadmill cardiolite May 2009, treadmill January 2010, treadmill August 2013, event monitor June 2014;  Cardiac Cath Results:  normal Left main, ecctatic proximal LAD, 20 % stenosis proximal LAD, normal CFX, scattered irregularities RCA;  60,   ____________________________ CARDIO-PULMONARY TEST DATES EKG Date:  10/15/2012;   Cardiac Cath Date:  09/30/2007;  Holter/Event Monitor Date: 10/15/2012;  Nuclear Study Date:  09/12/2007;  Chest Xray Date: 09/20/2007;   ____________________________ SOCIAL HISTORY Alcohol Use:  no alcohol use;  Smoking:  never smoked;  Diet:  regular diet;  Lifestyle:  married and 4 children;  Exercise:  walking for approximately 45 minutes 6 days per week;  Occupation:  physician;  Residence:  lives with wife;   ____________________________ REVIEW OF SYSTEMS General:  obesity Eyes: wears eye glasses/contact lenses Respiratory: denies dyspnea, cough, wheezing or hemoptysis. Cardiovascular:  please review HPI Abdominal: denies dyspepsia, GI bleeding, constipation, or diarrhea Musculoskeletal:  mild arthritis of the knees, aching, neck pain Neurological:  occasional migraine headaches  ____________________________ PHYSICAL EXAMINATION VITAL SIGNS  Blood Pressure:  132/80 Sitting, Left arm, regular cuff   Pulse:  76/min. Weight:  235.00 lbs. Height:  73"BMI: 31  Constitutional:  pleasant white male in no acute distress, mildly obese Skin:  warm and dry to touch, no apparent skin lesions, or masses noted. Head:  normocephalic, normal hair pattern, no masses or tenderness ENT:  ears, nose and throat reveal no  gross abnormalities.  Dentition good. Neck:  supple,  without massess. No JVD, thyromegaly or carotid bruits. Carotid upstroke normal. Chest:  normal symmetry, clear to auscultation and percussion. Cardiac:  regular rhythm, normal S1 and S2, No S3 or S4, no murmurs, gallops or rubs detected. Peripheral Pulses:  the femoral,dorsalis pedis, and posterior tibial pulses are full and equal bilaterally with no bruits auscultated. Extremities & Back:  no deformities, clubbing, cyanosis, erythema or edema observed. Normal muscle strength and tone. Neurological:  no gross motor or sensory deficits noted, affect appropriate, oriented x3. ____________________________ MOST RECENT LIPID PANEL 05/25/12  CHOL TOTL 140 mg/dl, LDL 74 calc, HDL 40 mg/dl, TRIGLYCER 366 mg/dl and CHOL/HDL 3.5 (Calc) ____________________________ IMPRESSIONS/PLAN  1. History of mild coronary artery disease 2. Premature ventricular contractions and history of mild nonsustained ventricular tachycardia with increased palpitations recently 3. Hypertension 4. Diabetes mellitus type 2 5. Hyperlipidemia with statin intolerance  Recommendations:  From a cardiac viewpoint I think it would be acceptable to proceed with the planned hip and knee surgery. Prior to this I would like him to wear a cardiac event monitor to evaluate the palpitations and the prior arrhythmia. He has stopped drinking ice tea and there are not any other real precipitating issues here. I would also like him to have an echocardiogram to evaluate his left ventricular function. He should continue to have his beta blockers administered during the perioperative period and following surgery. He should have prophylaxis for DVT following the surgery. I would be happy to see him with you at the time of surgery should the need arise. ____________________________ TODAYS ORDERS  1. 2D, color flow, doppler: First Available  2. [facility]: Today  3. 12 Lead EKG: Today                        ____________________________ Cardiology Physician:  Darden Palmer MD [facility]

## 2012-10-24 ENCOUNTER — Other Ambulatory Visit (INDEPENDENT_AMBULATORY_CARE_PROVIDER_SITE_OTHER): Payer: 59

## 2012-10-24 ENCOUNTER — Ambulatory Visit (INDEPENDENT_AMBULATORY_CARE_PROVIDER_SITE_OTHER): Payer: 59 | Admitting: Emergency Medicine

## 2012-10-24 ENCOUNTER — Other Ambulatory Visit: Payer: Self-pay | Admitting: Emergency Medicine

## 2012-10-24 VITALS — BP 159/93 | HR 62 | Temp 97.6°F | Resp 17 | Ht 72.5 in | Wt 238.0 lb

## 2012-10-24 DIAGNOSIS — I4949 Other premature depolarization: Secondary | ICD-10-CM

## 2012-10-24 DIAGNOSIS — G589 Mononeuropathy, unspecified: Secondary | ICD-10-CM

## 2012-10-24 DIAGNOSIS — G629 Polyneuropathy, unspecified: Secondary | ICD-10-CM

## 2012-10-24 DIAGNOSIS — I493 Ventricular premature depolarization: Secondary | ICD-10-CM

## 2012-10-24 DIAGNOSIS — E119 Type 2 diabetes mellitus without complications: Secondary | ICD-10-CM

## 2012-10-24 DIAGNOSIS — N529 Male erectile dysfunction, unspecified: Secondary | ICD-10-CM

## 2012-10-24 DIAGNOSIS — E785 Hyperlipidemia, unspecified: Secondary | ICD-10-CM

## 2012-10-24 DIAGNOSIS — Z79899 Other long term (current) drug therapy: Secondary | ICD-10-CM

## 2012-10-24 LAB — POCT CBC
Hemoglobin: 12.5 g/dL — AB (ref 14.1–18.1)
MCHC: 31.4 g/dL — AB (ref 31.8–35.4)
MID (cbc): 0.4 (ref 0–0.9)
MPV: 8.8 fL (ref 0–99.8)
POC Granulocyte: 3.8 (ref 2–6.9)
POC MID %: 7.6 %M (ref 0–12)
Platelet Count, POC: 320 10*3/uL (ref 142–424)
RBC: 4.19 M/uL — AB (ref 4.69–6.13)

## 2012-10-24 LAB — COMPREHENSIVE METABOLIC PANEL
AST: 17 U/L (ref 0–37)
Albumin: 4.1 g/dL (ref 3.5–5.2)
Alkaline Phosphatase: 33 U/L — ABNORMAL LOW (ref 39–117)
BUN: 27 mg/dL — ABNORMAL HIGH (ref 6–23)
Potassium: 4.1 mEq/L (ref 3.5–5.3)
Sodium: 140 mEq/L (ref 135–145)
Total Bilirubin: 0.4 mg/dL (ref 0.3–1.2)

## 2012-10-24 LAB — LIPID PANEL
HDL: 43 mg/dL (ref >39)
LDL Cholesterol: 63 mg/dL (ref 0–99)
VLDL: 23 mg/dL (ref 0–40)

## 2012-10-24 LAB — GLUCOSE, POCT (MANUAL RESULT ENTRY): POC Glucose: 160 mg/dl — AB (ref 70–99)

## 2012-10-24 MED ORDER — SILDENAFIL CITRATE 100 MG PO TABS: 50.0000 mg | ORAL_TABLET | Freq: Every day | ORAL | Status: DC | PRN

## 2012-10-24 NOTE — Progress Notes (Signed)
PATIENT HERE FOR LABS ONLY.

## 2012-10-24 NOTE — Progress Notes (Signed)
Subjective:    Patient ID: Phillip [name], male    DOB: 1950-06-12, 62 y.o.   MRN: [id number]  HPI patient enters for followup of his diabetes. He also is under the care of Dr. Donnie Aho  for his ventricular arrhythmia. He denies chest pain shortness of breath or any GI symptoms he currently uses binaural her ED. He is currently wearing a Holter monitor because of his problems with PVCs. In July he is scheduled for left total hip replacement as well as partial left knee replacement with Dr. Dion Saucier. Overall he is doing well he is trying to watch his diet. He is limited with exercise 2 to his knee and hip pain.    Review of Systems     Objective:   Physical Exam patient has a 1 by centimeter rough area on the top of the scalp. His pupils are equal and reactive to light. This are flat. Neck supple chest clear heart exam reveals frequent skipped beats runs of trigeminy and bigeminy. Soft liver spleen not enlarged. Extremities reveal 2+ swelling on the left 1+ on the right pulses are 2+ and symmetrical  Results for orders placed in visit on 10/24/12  COMPREHENSIVE METABOLIC PANEL      Result Value Range   Sodium 140  135 - 145 mEq/L   Potassium 4.1  3.5 - 5.3 mEq/L   Chloride 102  96 - 112 mEq/L   CO2 27  19 - 32 mEq/L   Glucose, Bld 142 (*) 70 - 99 mg/dL   BUN 27 (*) 6 - 23 mg/dL   Creat 2.84  1.32 - 4.40 mg/dL   Total Bilirubin 0.4  0.3 - 1.2 mg/dL   Alkaline Phosphatase 33 (*) 39 - 117 U/L   AST 17  0 - 37 U/L   ALT 24  0 - 53 U/L   Total Protein 6.8  6.0 - 8.3 g/dL   Albumin 4.1  3.5 - 5.2 g/dL   Calcium 9.9  8.4 - 10.2 mg/dL  LIPID PANEL      Result Value Range   Cholesterol 129  0 - 200 mg/dL   Triglycerides 725  <366 mg/dL   HDL 43  >44 mg/dL   Total CHOL/HDL Ratio 3.0     VLDL 23  0 - 40 mg/dL   LDL Cholesterol 63  0 - 99 mg/dL  POCT CBC      Result Value Range   WBC 5.9  4.6 - 10.2 K/uL   Lymph, poc 1.7  0.6 - 3.4   POC LYMPH PERCENT 28.7  10 - 50 %L   MID (cbc) 0.4  0 -  0.9   POC MID % 7.6  0 - 12 %M   POC Granulocyte 3.8  2 - 6.9   Granulocyte percent 63.7  37 - 80 %G   RBC 4.19 (*) 4.69 - 6.13 M/uL   Hemoglobin 12.5 (*) 14.1 - 18.1 g/dL   HCT, POC 03.4 (*) 74.2 - 53.7 %   MCV 94.9  80 - 97 fL   MCH, POC 29.8  27 - 31.2 pg   MCHC 31.4 (*) 31.8 - 35.4 g/dL   RDW, POC 59.5     Platelet Count, POC 320  142 - 424 K/uL   MPV 8.8  0 - 99.8 fL  GLUCOSE, POCT (MANUAL RESULT ENTRY)      Result Value Range   POC Glucose 160 (*) 70 - 99 mg/dl  POCT GLYCOSYLATED HEMOGLOBIN (HGB A1C)  Result Value Range   Hemoglobin A1C 7.0          Assessment & Plan:  Patient stable at present. He will need cardiac clearance prior to surgery. He has significant arthritis in the left hip and left knee. I refilled his Viagra other medications to remain the same.He is slightly low possibly from his xyloprim

## 2012-10-25 LAB — URIC ACID: Uric Acid, Serum: 4.7 mg/dL (ref 4.0–7.8)

## 2012-11-06 ENCOUNTER — Ambulatory Visit (HOSPITAL_COMMUNITY)
Admission: RE | Admit: 2012-11-06 | Discharge: 2012-11-06 | Disposition: A | Discharge status: Home / Self Care | Payer: 59 | Source: Ambulatory Visit | Attending: Anesthesiology | Admitting: Anesthesiology

## 2012-11-06 ENCOUNTER — Other Ambulatory Visit: Payer: Self-pay | Admitting: Orthopedic Surgery

## 2012-11-06 ENCOUNTER — Encounter (HOSPITAL_COMMUNITY): Payer: Self-pay | Admitting: Pharmacy Technician

## 2012-11-06 ENCOUNTER — Encounter (HOSPITAL_COMMUNITY): Payer: Self-pay

## 2012-11-06 ENCOUNTER — Encounter (HOSPITAL_COMMUNITY)
Admission: RE | Admit: 2012-11-06 | Discharge: 2012-11-06 | Disposition: A | Discharge status: Home / Self Care | Payer: 59 | Source: Ambulatory Visit | Attending: Orthopedic Surgery | Admitting: Orthopedic Surgery

## 2012-11-06 DIAGNOSIS — M4120 Other idiopathic scoliosis, site unspecified: Secondary | ICD-10-CM | POA: Insufficient documentation

## 2012-11-06 DIAGNOSIS — E119 Type 2 diabetes mellitus without complications: Secondary | ICD-10-CM | POA: Insufficient documentation

## 2012-11-06 DIAGNOSIS — Z01818 Encounter for other preprocedural examination: Secondary | ICD-10-CM | POA: Insufficient documentation

## 2012-11-06 DIAGNOSIS — Z01812 Encounter for preprocedural laboratory examination: Secondary | ICD-10-CM | POA: Insufficient documentation

## 2012-11-06 DIAGNOSIS — I10 Essential (primary) hypertension: Secondary | ICD-10-CM | POA: Insufficient documentation

## 2012-11-06 HISTORY — DX: Sleep apnea, unspecified: G47.30

## 2012-11-06 HISTORY — DX: Gout, unspecified: M10.9

## 2012-11-06 LAB — CBC
MCV: 88 fL (ref 78.0–100.0)
Platelets: 293 10*3/uL (ref 150–400)
RBC: 4.24 MIL/uL (ref 4.22–5.81)
WBC: 6.4 10*3/uL (ref 4.0–10.5)

## 2012-11-06 LAB — SURGICAL PCR SCREEN
MRSA, PCR: NEGATIVE
Staphylococcus aureus: NEGATIVE

## 2012-11-06 LAB — BASIC METABOLIC PANEL
CO2: 26 mEq/L (ref 19–32)
Calcium: 9.9 mg/dL (ref 8.4–10.5)
GFR calc Af Amer: 69 mL/min — ABNORMAL LOW (ref >90)
Sodium: 136 mEq/L (ref 135–145)

## 2012-11-06 LAB — PROTIME-INR
INR: 1.02 (ref 0.00–1.49)
Prothrombin Time: 13.2 seconds (ref 11.6–15.2)

## 2012-11-06 LAB — APTT: aPTT: 28 seconds (ref 24–37)

## 2012-11-06 NOTE — Progress Notes (Signed)
Requested EKG, Stress test from Dr. York Spaniel office.

## 2012-11-06 NOTE — Progress Notes (Signed)
Orders requested from Dr. Shelba Flake office.

## 2012-11-06 NOTE — Progress Notes (Signed)
Pt. Requested that T&S be done the DOS,he is going out of town to a conference and prefers not to wear the blood bracelet.

## 2012-11-06 NOTE — Pre-Procedure Instructions (Signed)
Phillip [name]  11/06/2012   Your procedure is scheduled on:  11-20-2012 @7 :30   Report to [facility] at 5:30 AM.  Call this number if you have problems the morning of surgery: [phone]   Remember:   Do not eat food or drink liquids after midnight.    Take these medicines the morning of surgery with A SIP OF WATER: allopurinol,flonase,fenofibrate,metoprolol              No diabetic medications the morning of surgery   Do not wear jewelry,  Do not wear lotions, powders, or perfumes.   Do not shave 48 hours prior to surgery. Men may shave face and neck.  Do not bring valuables to the hospital.  [facility] is not responsible for any belongings or valuables.  Contacts, dentures or bridgework may not be worn into surgery.  Leave suitcase in the car. After surgery it may be brought to your room.   For patients admitted to the hospital, checkout time is 11:00 AM the day of discharge.   Special Instructions: Shower using CHG 2 nights before surgery and the night before surgery.  If you shower the day of surgery use CHG.  Use special wash - you have one bottle of CHG for all showers.  You should use approximately 1/3 of the bottle for each shower.     Please read over the following fact sheets that you were given: Pain Booklet, Coughing and Deep Breathing, Blood Transfusion Information and Surgical Site Infection Prevention

## 2012-11-09 ENCOUNTER — Other Ambulatory Visit (HOSPITAL_COMMUNITY): Payer: Self-pay

## 2012-11-19 MED ORDER — CEFAZOLIN SODIUM-DEXTROSE 2-3 GM-% IV SOLR
2.0000 g | INTRAVENOUS | Status: AC
  Administered 2012-11-20 (×2): 2 g via INTRAVENOUS
  Filled 2012-11-19: qty 50

## 2012-11-20 ENCOUNTER — Encounter (HOSPITAL_COMMUNITY): Payer: Self-pay | Admitting: *Deleted

## 2012-11-20 ENCOUNTER — Encounter (HOSPITAL_COMMUNITY): Payer: Self-pay | Admitting: Anesthesiology

## 2012-11-20 ENCOUNTER — Encounter (HOSPITAL_COMMUNITY)
Admission: RE | Disposition: A | Discharge status: Home Health | Payer: Self-pay | Source: Ambulatory Visit | Attending: Orthopedic Surgery

## 2012-11-20 ENCOUNTER — Inpatient Hospital Stay (HOSPITAL_COMMUNITY): Payer: 59

## 2012-11-20 ENCOUNTER — Inpatient Hospital Stay (HOSPITAL_COMMUNITY)
Admission: RE | Admit: 2012-11-20 | Discharge: 2012-11-23 | DRG: 462 | Disposition: A | Discharge status: Home Health | Payer: 59 | Source: Ambulatory Visit | Attending: Orthopedic Surgery | Admitting: Orthopedic Surgery

## 2012-11-20 ENCOUNTER — Inpatient Hospital Stay (HOSPITAL_COMMUNITY): Payer: 59 | Admitting: Anesthesiology

## 2012-11-20 DIAGNOSIS — M109 Gout, unspecified: Secondary | ICD-10-CM | POA: Diagnosis present

## 2012-11-20 DIAGNOSIS — G4733 Obstructive sleep apnea (adult) (pediatric): Secondary | ICD-10-CM | POA: Diagnosis present

## 2012-11-20 DIAGNOSIS — I493 Ventricular premature depolarization: Secondary | ICD-10-CM | POA: Diagnosis present

## 2012-11-20 DIAGNOSIS — M1610 Unilateral primary osteoarthritis, unspecified hip: Principal | ICD-10-CM | POA: Diagnosis present

## 2012-11-20 DIAGNOSIS — M1710 Unilateral primary osteoarthritis, unspecified knee: Secondary | ICD-10-CM | POA: Diagnosis present

## 2012-11-20 DIAGNOSIS — I10 Essential (primary) hypertension: Secondary | ICD-10-CM | POA: Diagnosis present

## 2012-11-20 DIAGNOSIS — M169 Osteoarthritis of hip, unspecified: Principal | ICD-10-CM | POA: Diagnosis present

## 2012-11-20 DIAGNOSIS — Z79899 Other long term (current) drug therapy: Secondary | ICD-10-CM

## 2012-11-20 DIAGNOSIS — M1712 Unilateral primary osteoarthritis, left knee: Secondary | ICD-10-CM | POA: Diagnosis present

## 2012-11-20 DIAGNOSIS — E1140 Type 2 diabetes mellitus with diabetic neuropathy, unspecified: Secondary | ICD-10-CM | POA: Diagnosis present

## 2012-11-20 DIAGNOSIS — E119 Type 2 diabetes mellitus without complications: Secondary | ICD-10-CM | POA: Diagnosis present

## 2012-11-20 DIAGNOSIS — Z7982 Long term (current) use of aspirin: Secondary | ICD-10-CM

## 2012-11-20 DIAGNOSIS — M1612 Unilateral primary osteoarthritis, left hip: Secondary | ICD-10-CM

## 2012-11-20 DIAGNOSIS — E785 Hyperlipidemia, unspecified: Secondary | ICD-10-CM | POA: Diagnosis present

## 2012-11-20 HISTORY — DX: Unilateral primary osteoarthritis, left knee: M17.12

## 2012-11-20 HISTORY — PX: PARTIAL KNEE ARTHROPLASTY: SHX2174

## 2012-11-20 HISTORY — DX: Myoneural disorder, unspecified: G70.9

## 2012-11-20 HISTORY — DX: Unilateral primary osteoarthritis, left hip: M16.12

## 2012-11-20 HISTORY — PX: TOTAL HIP ARTHROPLASTY: SHX124

## 2012-11-20 HISTORY — PX: REPLACEMENT UNICONDYLAR JOINT KNEE: SUR1227

## 2012-11-20 LAB — GLUCOSE, CAPILLARY
Glucose-Capillary: 192 mg/dL — ABNORMAL HIGH (ref 70–99)
Glucose-Capillary: 183 mg/dL — ABNORMAL HIGH (ref 70–99)

## 2012-11-20 LAB — CBC
HCT: 33.5 % — ABNORMAL LOW (ref 39.0–52.0)
Hemoglobin: 11.3 g/dL — ABNORMAL LOW (ref 13.0–17.0)
MCHC: 33.7 g/dL (ref 30.0–36.0)
MCV: 89.8 fL (ref 78.0–100.0)
RDW: 12.6 % (ref 11.5–15.5)

## 2012-11-20 LAB — ABO/RH: ABO/RH(D): A POS

## 2012-11-20 SURGERY — ARTHROPLASTY, KNEE, UNICOMPARTMENTAL
Anesthesia: General | Site: Knee | Laterality: Left | Wound class: Clean

## 2012-11-20 MED ORDER — MEPERIDINE HCL 25 MG/ML IJ SOLN: 6.2500 mg | INTRAMUSCULAR | Status: DC | PRN

## 2012-11-20 MED ORDER — ACETAMINOPHEN 650 MG RE SUPP: 650.0000 mg | Freq: Four times a day (QID) | RECTAL | Status: DC | PRN

## 2012-11-20 MED ORDER — PHENOL 1.4 % MT LIQD: 1.0000 | OROMUCOSAL | Status: DC | PRN

## 2012-11-20 MED ORDER — FENTANYL CITRATE 0.05 MG/ML IJ SOLN
25.0000 ug | INTRAMUSCULAR | Status: DC | PRN
  Administered 2012-11-20: 50 ug via INTRAVENOUS

## 2012-11-20 MED ORDER — MIDAZOLAM HCL 5 MG/5ML IJ SOLN
INTRAMUSCULAR | Status: DC | PRN
  Administered 2012-11-20: 2 mg via INTRAVENOUS

## 2012-11-20 MED ORDER — FENTANYL CITRATE 0.05 MG/ML IJ SOLN
INTRAMUSCULAR | Status: AC
  Filled 2012-11-20: qty 2

## 2012-11-20 MED ORDER — LIDOCAINE HCL (CARDIAC) 20 MG/ML IV SOLN
INTRAVENOUS | Status: DC | PRN
  Administered 2012-11-20: 100 mg via INTRAVENOUS

## 2012-11-20 MED ORDER — DOCUSATE SODIUM 100 MG PO CAPS
100.0000 mg | ORAL_CAPSULE | Freq: Two times a day (BID) | ORAL | Status: DC
  Administered 2012-11-20 – 2012-11-23 (×7): 100 mg via ORAL
  Filled 2012-11-20 (×9): qty 1

## 2012-11-20 MED ORDER — FLUTICASONE PROPIONATE 50 MCG/ACT NA SUSP: 2.0000 | Freq: Every day | NASAL | Status: DC | PRN

## 2012-11-20 MED ORDER — POTASSIUM CHLORIDE IN NACL 20-0.45 MEQ/L-% IV SOLN
INTRAVENOUS | Status: DC
  Administered 2012-11-20 – 2012-11-22 (×2): via INTRAVENOUS
  Filled 2012-11-20 (×5): qty 1000

## 2012-11-20 MED ORDER — BUPIVACAINE HCL (PF) 0.25 % IJ SOLN
INTRAMUSCULAR | Status: DC | PRN
  Administered 2012-11-20: 10 mL

## 2012-11-20 MED ORDER — EPHEDRINE SULFATE 50 MG/ML IJ SOLN
INTRAMUSCULAR | Status: DC | PRN
  Administered 2012-11-20 (×3): 5 mg via INTRAVENOUS

## 2012-11-20 MED ORDER — GLYCOPYRROLATE 0.2 MG/ML IJ SOLN
INTRAMUSCULAR | Status: DC | PRN
  Administered 2012-11-20: 0.2 mg via INTRAVENOUS
  Administered 2012-11-20: .4 mg via INTRAVENOUS

## 2012-11-20 MED ORDER — MIDAZOLAM HCL 2 MG/2ML IJ SOLN: 0.5000 mg | Freq: Once | INTRAMUSCULAR | Status: DC | PRN

## 2012-11-20 MED ORDER — MAGNESIUM CITRATE PO SOLN
1.0000 | Freq: Once | ORAL | Status: AC | PRN
  Filled 2012-11-20: qty 296

## 2012-11-20 MED ORDER — ALLOPURINOL 300 MG PO TABS
300.0000 mg | ORAL_TABLET | Freq: Every day | ORAL | Status: DC
  Administered 2012-11-21 – 2012-11-23 (×3): 300 mg via ORAL
  Filled 2012-11-20 (×3): qty 1

## 2012-11-20 MED ORDER — ONDANSETRON HCL 4 MG PO TABS: 4.0000 mg | ORAL_TABLET | Freq: Four times a day (QID) | ORAL | Status: DC | PRN

## 2012-11-20 MED ORDER — OXYCODONE HCL 5 MG PO TABS
5.0000 mg | ORAL_TABLET | ORAL | Status: DC | PRN
  Administered 2012-11-20 – 2012-11-23 (×12): 10 mg via ORAL
  Administered 2012-11-23 (×2): 5 mg via ORAL
  Filled 2012-11-20 (×9): qty 2
  Filled 2012-11-20: qty 1
  Filled 2012-11-20 (×4): qty 2

## 2012-11-20 MED ORDER — BUPIVACAINE HCL (PF) 0.25 % IJ SOLN
INTRAMUSCULAR | Status: AC
  Filled 2012-11-20: qty 60

## 2012-11-20 MED ORDER — SENNA 8.6 MG PO TABS
1.0000 | ORAL_TABLET | Freq: Two times a day (BID) | ORAL | Status: DC
  Administered 2012-11-20 – 2012-11-23 (×5): 8.6 mg via ORAL
  Filled 2012-11-20 (×9): qty 1

## 2012-11-20 MED ORDER — METHOCARBAMOL 500 MG PO TABS
500.0000 mg | ORAL_TABLET | Freq: Four times a day (QID) | ORAL | Status: DC | PRN
  Administered 2012-11-20 – 2012-11-23 (×8): 500 mg via ORAL
  Filled 2012-11-20 (×7): qty 1

## 2012-11-20 MED ORDER — MENTHOL 3 MG MT LOZG: 1.0000 | LOZENGE | OROMUCOSAL | Status: DC | PRN

## 2012-11-20 MED ORDER — NEOSTIGMINE METHYLSULFATE 1 MG/ML IJ SOLN
INTRAMUSCULAR | Status: DC | PRN
  Administered 2012-11-20: 2 mg via INTRAVENOUS

## 2012-11-20 MED ORDER — FENTANYL CITRATE 0.05 MG/ML IJ SOLN
INTRAMUSCULAR | Status: DC | PRN
  Administered 2012-11-20 (×5): 50 ug via INTRAVENOUS
  Administered 2012-11-20 (×2): 100 ug via INTRAVENOUS
  Administered 2012-11-20 (×3): 50 ug via INTRAVENOUS

## 2012-11-20 MED ORDER — HYDROMORPHONE HCL PF 1 MG/ML IJ SOLN
1.0000 mg | INTRAMUSCULAR | Status: DC | PRN
  Administered 2012-11-20 (×3): 1 mg via INTRAVENOUS
  Filled 2012-11-20 (×3): qty 1

## 2012-11-20 MED ORDER — METHOCARBAMOL 500 MG PO TABS
ORAL_TABLET | ORAL | Status: AC
  Administered 2012-11-20: 500 mg
  Filled 2012-11-20: qty 1

## 2012-11-20 MED ORDER — LOSARTAN POTASSIUM 50 MG PO TABS
100.0000 mg | ORAL_TABLET | Freq: Every day | ORAL | Status: DC
  Administered 2012-11-20 – 2012-11-23 (×4): 100 mg via ORAL
  Filled 2012-11-20 (×4): qty 2

## 2012-11-20 MED ORDER — FENOFIBRATE 160 MG PO TABS
160.0000 mg | ORAL_TABLET | Freq: Every day | ORAL | Status: DC
  Administered 2012-11-21 – 2012-11-23 (×3): 160 mg via ORAL
  Filled 2012-11-20 (×3): qty 1

## 2012-11-20 MED ORDER — ENOXAPARIN SODIUM 40 MG/0.4ML ~~LOC~~ SOLN: 40.0000 mg | SUBCUTANEOUS | Status: DC

## 2012-11-20 MED ORDER — INSULIN ASPART 100 UNIT/ML ~~LOC~~ SOLN
0.0000 [IU] | Freq: Three times a day (TID) | SUBCUTANEOUS | Status: DC
  Administered 2012-11-20 – 2012-11-21 (×4): 3 [IU] via SUBCUTANEOUS
  Administered 2012-11-22: 5 [IU] via SUBCUTANEOUS
  Administered 2012-11-22: 2 [IU] via SUBCUTANEOUS
  Administered 2012-11-22: 3 [IU] via SUBCUTANEOUS
  Administered 2012-11-23: 5 [IU] via SUBCUTANEOUS

## 2012-11-20 MED ORDER — LOSARTAN POTASSIUM-HCTZ 100-25 MG PO TABS: 1.0000 | ORAL_TABLET | Freq: Every day | ORAL | Status: DC

## 2012-11-20 MED ORDER — KETOROLAC TROMETHAMINE 30 MG/ML IJ SOLN
INTRAMUSCULAR | Status: AC
  Filled 2012-11-20: qty 1

## 2012-11-20 MED ORDER — PROMETHAZINE HCL 25 MG/ML IJ SOLN: 6.2500 mg | INTRAMUSCULAR | Status: DC | PRN

## 2012-11-20 MED ORDER — BISACODYL 10 MG RE SUPP: 10.0000 mg | Freq: Every day | RECTAL | Status: DC | PRN

## 2012-11-20 MED ORDER — METHOCARBAMOL 100 MG/ML IJ SOLN
500.0000 mg | Freq: Four times a day (QID) | INTRAVENOUS | Status: DC | PRN
  Filled 2012-11-20: qty 5

## 2012-11-20 MED ORDER — ZOLPIDEM TARTRATE 5 MG PO TABS: 5.0000 mg | ORAL_TABLET | Freq: Every evening | ORAL | Status: DC | PRN

## 2012-11-20 MED ORDER — OXYCODONE-ACETAMINOPHEN 10-325 MG PO TABS: 1.0000 | ORAL_TABLET | Freq: Four times a day (QID) | ORAL | Status: DC | PRN

## 2012-11-20 MED ORDER — LINAGLIPTIN 5 MG PO TABS
5.0000 mg | ORAL_TABLET | Freq: Every day | ORAL | Status: DC
  Administered 2012-11-21 – 2012-11-23 (×3): 5 mg via ORAL
  Filled 2012-11-20 (×3): qty 1

## 2012-11-20 MED ORDER — ROCURONIUM BROMIDE 100 MG/10ML IV SOLN
INTRAVENOUS | Status: DC | PRN
  Administered 2012-11-20: 50 mg via INTRAVENOUS

## 2012-11-20 MED ORDER — METHOCARBAMOL 500 MG PO TABS: 500.0000 mg | ORAL_TABLET | Freq: Four times a day (QID) | ORAL | Status: DC

## 2012-11-20 MED ORDER — DIPHENHYDRAMINE HCL 12.5 MG/5ML PO ELIX: 12.5000 mg | ORAL_SOLUTION | ORAL | Status: DC | PRN

## 2012-11-20 MED ORDER — PROPOFOL 10 MG/ML IV BOLUS
INTRAVENOUS | Status: DC | PRN
  Administered 2012-11-20: 200 mg via INTRAVENOUS

## 2012-11-20 MED ORDER — ACETAMINOPHEN 325 MG PO TABS: 650.0000 mg | ORAL_TABLET | Freq: Four times a day (QID) | ORAL | Status: DC | PRN

## 2012-11-20 MED ORDER — KETOROLAC TROMETHAMINE 15 MG/ML IJ SOLN
15.0000 mg | Freq: Four times a day (QID) | INTRAMUSCULAR | Status: AC
  Administered 2012-11-20 – 2012-11-21 (×4): 15 mg via INTRAVENOUS
  Filled 2012-11-20 (×4): qty 1

## 2012-11-20 MED ORDER — 0.9 % SODIUM CHLORIDE (POUR BTL) OPTIME
TOPICAL | Status: DC | PRN
  Administered 2012-11-20: 1000 mL

## 2012-11-20 MED ORDER — ONDANSETRON HCL 4 MG/2ML IJ SOLN
INTRAMUSCULAR | Status: DC | PRN
  Administered 2012-11-20: 4 mg via INTRAVENOUS

## 2012-11-20 MED ORDER — METOCLOPRAMIDE HCL 5 MG/ML IJ SOLN
5.0000 mg | Freq: Three times a day (TID) | INTRAMUSCULAR | Status: DC | PRN
  Administered 2012-11-20: 5 mg via INTRAVENOUS
  Filled 2012-11-20: qty 2

## 2012-11-20 MED ORDER — ALUM & MAG HYDROXIDE-SIMETH 200-200-20 MG/5ML PO SUSP: 30.0000 mL | ORAL | Status: DC | PRN

## 2012-11-20 MED ORDER — ONDANSETRON HCL 4 MG/2ML IJ SOLN
4.0000 mg | Freq: Four times a day (QID) | INTRAMUSCULAR | Status: DC | PRN
  Administered 2012-11-22: 4 mg via INTRAVENOUS
  Filled 2012-11-20: qty 2

## 2012-11-20 MED ORDER — HYDROCHLOROTHIAZIDE 25 MG PO TABS
25.0000 mg | ORAL_TABLET | Freq: Every day | ORAL | Status: DC
  Administered 2012-11-20 – 2012-11-23 (×4): 25 mg via ORAL
  Filled 2012-11-20 (×4): qty 1

## 2012-11-20 MED ORDER — KETOROLAC TROMETHAMINE 30 MG/ML IJ SOLN
15.0000 mg | Freq: Once | INTRAMUSCULAR | Status: AC | PRN
  Administered 2012-11-20: 30 mg via INTRAVENOUS

## 2012-11-20 MED ORDER — METOPROLOL TARTRATE 100 MG PO TABS
100.0000 mg | ORAL_TABLET | Freq: Two times a day (BID) | ORAL | Status: DC
  Administered 2012-11-20 – 2012-11-23 (×6): 100 mg via ORAL
  Filled 2012-11-20 (×7): qty 1

## 2012-11-20 MED ORDER — METOCLOPRAMIDE HCL 10 MG PO TABS: 5.0000 mg | ORAL_TABLET | Freq: Three times a day (TID) | ORAL | Status: DC | PRN

## 2012-11-20 MED ORDER — PROMETHAZINE HCL 25 MG PO TABS: 25.0000 mg | ORAL_TABLET | Freq: Four times a day (QID) | ORAL | Status: DC | PRN

## 2012-11-20 MED ORDER — SODIUM CHLORIDE 0.9 % IR SOLN
Status: DC | PRN
  Administered 2012-11-20: 1000 mL

## 2012-11-20 MED ORDER — ASPIRIN 81 MG PO TABS: 81.0000 mg | ORAL_TABLET | Freq: Every day | ORAL | Status: DC

## 2012-11-20 MED ORDER — CEFAZOLIN SODIUM-DEXTROSE 2-3 GM-% IV SOLR
2.0000 g | Freq: Four times a day (QID) | INTRAVENOUS | Status: AC
  Administered 2012-11-20 (×2): 2 g via INTRAVENOUS
  Filled 2012-11-20 (×2): qty 50

## 2012-11-20 MED ORDER — ASPIRIN EC 81 MG PO TBEC
81.0000 mg | DELAYED_RELEASE_TABLET | Freq: Every day | ORAL | Status: DC
  Administered 2012-11-20 – 2012-11-23 (×4): 81 mg via ORAL
  Filled 2012-11-20 (×4): qty 1

## 2012-11-20 MED ORDER — ONDANSETRON HCL 4 MG/2ML IJ SOLN
INTRAMUSCULAR | Status: AC
  Filled 2012-11-20: qty 2

## 2012-11-20 MED ORDER — ENOXAPARIN SODIUM 40 MG/0.4ML ~~LOC~~ SOLN
40.0000 mg | SUBCUTANEOUS | Status: DC
  Administered 2012-11-21 – 2012-11-23 (×3): 40 mg via SUBCUTANEOUS
  Filled 2012-11-20 (×5): qty 0.4

## 2012-11-20 MED ORDER — ARTIFICIAL TEARS OP OINT
TOPICAL_OINTMENT | OPHTHALMIC | Status: DC | PRN
  Administered 2012-11-20: 1 via OPHTHALMIC

## 2012-11-20 MED ORDER — LACTATED RINGERS IV SOLN
INTRAVENOUS | Status: DC | PRN
  Administered 2012-11-20 (×3): via INTRAVENOUS

## 2012-11-20 MED ORDER — POLYETHYLENE GLYCOL 3350 17 G PO PACK: 17.0000 g | PACK | Freq: Every day | ORAL | Status: DC | PRN

## 2012-11-20 SURGICAL SUPPLY — 90 items
BANDAGE ELASTIC 6 VELCRO ST LF (GAUZE/BANDAGES/DRESSINGS) ×3 IMPLANT
BANDAGE ESMARK 6X9 LF (GAUZE/BANDAGES/DRESSINGS) ×2 IMPLANT
BENZOIN TINCTURE PRP APPL 2/3 (GAUZE/BANDAGES/DRESSINGS) ×3 IMPLANT
BLADE SAW RECIP 87.9 MT (BLADE) ×3 IMPLANT
BLADE SAW SAG 73X25 THK (BLADE) ×1
BLADE SAW SGTL 73X25 THK (BLADE) ×2 IMPLANT
BNDG ESMARK 6X9 LF (GAUZE/BANDAGES/DRESSINGS) ×3
BOOTCOVER CLEANROOM LRG (PROTECTIVE WEAR) ×6 IMPLANT
BOWL SMART MIX CTS (DISPOSABLE) ×6 IMPLANT
BRUSH FEMORAL CANAL (MISCELLANEOUS) IMPLANT
CAPT HIP PF MOP ×3 IMPLANT
CAPT KNEE OXFORD ×3 IMPLANT
CEMENT HV SMART SET (Cement) ×6 IMPLANT
CLOSURE STERI-STRIP 1/4X4 (GAUZE/BANDAGES/DRESSINGS) ×3 IMPLANT
CLOTH BEACON ORANGE TIMEOUT ST (SAFETY) ×3 IMPLANT
CLSR STERI-STRIP ANTIMIC 1/2X4 (GAUZE/BANDAGES/DRESSINGS) ×3 IMPLANT
COVER BACK TABLE 24X17X13 BIG (DRAPES) IMPLANT
COVER SURGICAL LIGHT HANDLE (MISCELLANEOUS) ×3 IMPLANT
CUFF TOURNIQUET SINGLE 34IN LL (TOURNIQUET CUFF) ×3 IMPLANT
DRAPE EXTREMITY T 121X128X90 (DRAPE) ×3 IMPLANT
DRAPE INCISE IOBAN 66X45 STRL (DRAPES) ×6 IMPLANT
DRAPE ORTHO SPLIT 77X108 STRL (DRAPES) ×2
DRAPE PROXIMA HALF (DRAPES) ×3 IMPLANT
DRAPE SURG ORHT 6 SPLT 77X108 (DRAPES) ×4 IMPLANT
DRAPE U-SHAPE 47X51 STRL (DRAPES) ×6 IMPLANT
DRILL BIT 5/64 (BIT) ×3 IMPLANT
DRSG MEPILEX BORDER 4X12 (GAUZE/BANDAGES/DRESSINGS) IMPLANT
DRSG MEPILEX BORDER 4X8 (GAUZE/BANDAGES/DRESSINGS) ×3 IMPLANT
DRSG PAD ABDOMINAL 8X10 ST (GAUZE/BANDAGES/DRESSINGS) ×3 IMPLANT
DURAPREP 26ML APPLICATOR (WOUND CARE) ×6 IMPLANT
ELECT BLADE 4.0 EZ CLEAN MEGAD (MISCELLANEOUS) ×3
ELECT CAUTERY BLADE 6.4 (BLADE) ×3 IMPLANT
ELECT REM PT RETURN 9FT ADLT (ELECTROSURGICAL)
ELECTRODE BLDE 4.0 EZ CLN MEGD (MISCELLANEOUS) ×2 IMPLANT
ELECTRODE REM PT RTRN 9FT ADLT (ELECTROSURGICAL) IMPLANT
EVACUATOR 1/8 PVC DRAIN (DRAIN) IMPLANT
FACESHIELD LNG OPTICON STERILE (SAFETY) ×3 IMPLANT
GLOVE BIOGEL M 7.0 STRL (GLOVE) ×6 IMPLANT
GLOVE BIOGEL PI IND STRL 7.5 (GLOVE) ×4 IMPLANT
GLOVE BIOGEL PI INDICATOR 7.5 (GLOVE) ×2
GLOVE BIOGEL PI ORTHO PRO SZ8 (GLOVE) ×2
GLOVE ORTHO TXT STRL SZ7.5 (GLOVE) ×6 IMPLANT
GLOVE PI ORTHO PRO STRL SZ8 (GLOVE) ×4 IMPLANT
GLOVE SURG ORTHO 8.0 STRL STRW (GLOVE) ×6 IMPLANT
GOWN STRL NON-REIN LRG LVL3 (GOWN DISPOSABLE) IMPLANT
HANDPIECE INTERPULSE COAX TIP (DISPOSABLE) ×1
HOOD PEEL AWAY FACE SHEILD DIS (HOOD) ×6 IMPLANT
KIT BASIN OR (CUSTOM PROCEDURE TRAY) ×3 IMPLANT
KIT ROOM TURNOVER OR (KITS) ×3 IMPLANT
KIT SAW BLADE (KITS) ×3 IMPLANT
MANIFOLD NEPTUNE II (INSTRUMENTS) ×3 IMPLANT
MARKER SKIN DUAL TIP RULER LAB (MISCELLANEOUS) ×3 IMPLANT
NDL SUT 2 .5 CRC MAYO 1.732X (NEEDLE) ×2 IMPLANT
NEEDLE HYPO 25GX1X1/2 BEV (NEEDLE) ×3 IMPLANT
NEEDLE MAYO TAPER (NEEDLE) ×1
NS IRRIG 1000ML POUR BTL (IV SOLUTION) ×3 IMPLANT
PACK TOTAL JOINT (CUSTOM PROCEDURE TRAY) ×3 IMPLANT
PAD ARMBOARD 7.5X6 YLW CONV (MISCELLANEOUS) ×6 IMPLANT
PAD CAST 4YDX4 CTTN HI CHSV (CAST SUPPLIES) IMPLANT
PAD SHARPS MAGNETIC DISPOSAL (MISCELLANEOUS) ×3 IMPLANT
PADDING CAST COTTON 4X4 STRL (CAST SUPPLIES)
PADDING CAST COTTON 6X4 STRL (CAST SUPPLIES) IMPLANT
PILLOW ABDUCTION HIP (SOFTGOODS) ×3 IMPLANT
PRESSURIZER FEMORAL UNIV (MISCELLANEOUS) IMPLANT
RETRIEVER SUT HEWSON (MISCELLANEOUS) ×3 IMPLANT
RUBBERBAND STERILE (MISCELLANEOUS) ×3 IMPLANT
SET HNDPC FAN SPRY TIP SCT (DISPOSABLE) ×2 IMPLANT
SPONGE GAUZE 4X4 12PLY (GAUZE/BANDAGES/DRESSINGS) ×3 IMPLANT
SPONGE LAP 4X18 X RAY DECT (DISPOSABLE) IMPLANT
STAPLER VISISTAT 35W (STAPLE) ×3 IMPLANT
STRIP CLOSURE SKIN 1/2X4 (GAUZE/BANDAGES/DRESSINGS) IMPLANT
SUCTION FRAZIER TIP 10 FR DISP (SUCTIONS) ×3 IMPLANT
SUT FIBERWIRE #2 38 REV NDL BL (SUTURE) ×6
SUT MNCRL AB 4-0 PS2 18 (SUTURE) ×3 IMPLANT
SUT VIC AB 0 CT1 27 (SUTURE) ×1
SUT VIC AB 0 CT1 27XBRD ANBCTR (SUTURE) ×2 IMPLANT
SUT VIC AB 1 CT1 27 (SUTURE) ×1
SUT VIC AB 1 CT1 27XBRD ANBCTR (SUTURE) ×2 IMPLANT
SUT VIC AB 2-0 CT1 18 (SUTURE) IMPLANT
SUT VIC AB 2-0 CT1 27 (SUTURE) ×2
SUT VIC AB 2-0 CT1 TAPERPNT 27 (SUTURE) ×4 IMPLANT
SUT VIC AB 3-0 SH 18 (SUTURE) ×3 IMPLANT
SUTURE FIBERWR#2 38 REV NDL BL (SUTURE) ×4 IMPLANT
SYR 30ML LL (SYRINGE) ×3 IMPLANT
SYR CONTROL 10ML LL (SYRINGE) ×3 IMPLANT
TOWEL OR 17X24 6PK STRL BLUE (TOWEL DISPOSABLE) ×3 IMPLANT
TOWEL OR 17X26 10 PK STRL BLUE (TOWEL DISPOSABLE) ×3 IMPLANT
TOWER CARTRIDGE SMART MIX (DISPOSABLE) IMPLANT
TRAY FOLEY CATH 16FRSI W/METER (SET/KITS/TRAYS/PACK) ×3 IMPLANT
WATER STERILE IRR 1000ML POUR (IV SOLUTION) ×6 IMPLANT

## 2012-11-20 NOTE — Transfer of Care (Signed)
Immediate Anesthesia Transfer of Care Note  Patient: Phillip [name]  Procedure(s) Performed: Procedure(s): UNICOMPARTMENTAL KNEE (Left) TOTAL HIP ARTHROPLASTY (Left)  Patient Location: PACU  Anesthesia Type:General  Level of Consciousness: awake, alert  and oriented  Airway & Oxygen Therapy: Patient Spontanous Breathing and Patient connected to nasal cannula oxygen  Post-op Assessment: Report given to PACU RN, Post -op Vital signs reviewed and stable and Patient moving all extremities X 4  Post vital signs: Reviewed and stable  Complications: No apparent anesthesia complications

## 2012-11-20 NOTE — Anesthesia Preprocedure Evaluation (Signed)
Anesthesia Evaluation  Patient identified by MRN, date of birth, ID band Patient awake    Reviewed: Allergy & Precautions, H&P , Patient's Chart, lab work & pertinent test results, reviewed documented beta blocker date and time   History of Anesthesia Complications Negative for: history of anesthetic complications  Airway Mallampati: II TM Distance: >3 FB Neck ROM: full    Dental no notable dental hx.    Pulmonary neg pulmonary ROS, sleep apnea ,  breath sounds clear to auscultation  Pulmonary exam normal       Cardiovascular Exercise Tolerance: Good hypertension, negative cardio ROS  + dysrhythmias Rhythm:regular Rate:Normal     Neuro/Psych negative neurological ROS  negative psych ROS   GI/Hepatic negative GI ROS, Neg liver ROS,   Endo/Other  negative endocrine ROSdiabetes  Renal/GU negative Renal ROS     Musculoskeletal   Abdominal   Peds  Hematology negative hematology ROS (+) anemia ,   Anesthesia Other Findings Anemia     Arthritis        Diabetes mellitus     Hyperlipidemia        Hypertension     Dysrhythmia        Sleep apnea   cpap Gout    Reproductive/Obstetrics negative OB ROS                           Anesthesia Physical Anesthesia Plan  ASA: III  Anesthesia Plan: General ETT   Post-op Pain Management:    Induction:   Airway Management Planned:   Additional Equipment:   Intra-op Plan:   Post-operative Plan:   Informed Consent: I have reviewed the patients History and Physical, chart, labs and discussed the procedure including the risks, benefits and alternatives for the proposed anesthesia with the patient or authorized representative who has indicated his/her understanding and acceptance.   Dental Advisory Given  Plan Discussed with: CRNA and Surgeon  Anesthesia Plan Comments:         Anesthesia Quick Evaluation

## 2012-11-20 NOTE — Progress Notes (Signed)
Patient now O2 bled in . 2lpm oxygen bled into cpap unit. O2 96% HR68

## 2012-11-20 NOTE — Plan of Care (Signed)
Problem: Consults Goal: Diagnosis- Total Joint Replacement Primary Total Hip Left     

## 2012-11-20 NOTE — Progress Notes (Signed)
Patient set-up with nasal cpap aflex 4-20cm. Does not want O2 bleed in. Patient has his own prongs but is using our machine and tubing. Will place himself on when he feels like napping

## 2012-11-20 NOTE — Progress Notes (Signed)
Received report from Nonah Mattes for patient care

## 2012-11-20 NOTE — Progress Notes (Signed)
Orthopedic Tech Progress Note Patient Details:  Phillip [name] 1950/06/11 [id number]  Patient ID: Phillip [name], male   DOB: 1950/10/04, 62 y.o.   MRN: [id number]   Phillip [name] 11/20/2012, 4:48 PMTrapeze bar

## 2012-11-20 NOTE — Op Note (Signed)
11/20/2012  9:49 AM  PATIENT:  Phillip [name]   MRN: [id number]  PRE-OPERATIVE DIAGNOSIS:  DJD LEFT MEDIAL COMPARTMENT KNEE ARTHRITIS, DJD LEFT HIP  POST-OPERATIVE DIAGNOSIS:  DJD LEFT MEDIAL COMPARTMENT KNEE ARTHRITIS, DJD LEFT HIP  PROCEDURE:  Procedure(s): UNICOMPARTMENTAL KNEE TOTAL HIP ARTHROPLASTY  PREOPERATIVE INDICATIONS:    Phillip [name] is an 62 y.o. male who has a diagnosis of Osteoarthritis of left hip and and medial knee who elected for surgical management after failing conservative treatment.  The risks benefits and alternatives were discussed with the patient including but not limited to the risks of nonoperative treatment, versus surgical intervention including infection, bleeding, nerve injury, periprosthetic fracture, the need for revision surgery, dislocation, leg length discrepancy, blood clots, cardiopulmonary complications, morbidity, mortality, among others, and they were willing to proceed.     OPERATIVE REPORT     SURGEON:  Teryl Lucy, MD    ASSISTANT:  Janace Litten, OPA-C  (Present throughout the entire procedure,  necessary for completion of procedure in a timely manner, assisting with retraction, instrumentation, and closure)     ANESTHESIA:  General    COMPLICATIONS:  None.     COMPONENTS:  [employer] fit femur size 5 high offset with a 36 mm +8.5 head ball and a gription acetabular shell size 54 with a 10 degree lipped polyethylene liner and an apex hole eliminator.  [employer] mobile bearing medial compartment arthroplasty size medium for the femur, D for the tibia, with a 4 mm mobile bearing polyethylene insert.  OPERATIVE FINDINGS: Severe degenerative changes on both the acetabulum and the femoral head on the left hip. Extensive grade 3 and grade 4 medial compartment osteoarthritis. No significant changes in the lateral or patellofemoral joint      PROCEDURE IN DETAIL:   The patient was met in the holding area and  identified.   The appropriate hip was identified and marked at the operative site.  The patient was then transported to the OR  and  placed under general anesthesia.  At that point, the patient was  placed in the lateral decubitus position with the operative side up and  secured to the operating room table and all bony prominences padded.     The operative lower extremity was prepped from the iliac crest to the toes.  Sterile draping was performed.  Time out was performed prior to incision.      A routine posterolateral approach was utilized via sharp dissection  carried down to the subcutaneous tissue.  Gross bleeders were Bovie coagulated.  The iliotibial band was identified and incised along the length of the skin incision.  Self-retaining retractors were  inserted.  With the hip internally rotated, the short external rotators  were identified. The piriformis and capsule was tagged with FiberWire, and the hip capsule released in a T-type fashion.  The femoral neck was exposed, and I resected the femoral neck using the appropriate jig. The head was very large and the neck quite long.  This was performed at approximately a thumb's breadth above the lesser trochanter.    I then exposed the deep acetabulum, cleared out any tissue including the ligamentum teres.  A wing retractor was placed.  After adequate visualization, I excised the labrum, and then sequentially reamed.  I placed the trial acetabulum, which seated nicely, and then impacted the real cup into place.  Appropriate version and inclination was confirmed clinically matching their bony anatomy, and also with the use of the jig.  A trial polyethylene liner was placed and the wing retractor removed.    I then prepared the proximal femur using the cookie-cutter, the lateralizing reamer, and then sequentially reamed and broached.  A trial broach, neck, and head was utilized, and I reduced the hip and it was found to have excellent stability with functional  range of motion. The trial components were then removed, and the real polyethylene liner was placed with the lip directed posteriorly.  I then impacted the real femoral prosthesis into place into the appropriate version, slightly anteverted to the normal anatomy, and I impacted the real head ball into place. The hip was then reduced and taken through functional range of motion and found to have excellent stability. Leg lengths were restored.  I then used a 2 mm drill bits to pass the FiberWire suture from the capsule and piriformis through the greater trochanter, and secured this. Excellent posterior capsular repair was achieved. I also closed the T in the capsule.  I then irrigated the hip copiously again with pulse lavage, and repaired the fascia with Vicryl, followed by Vicryl for the subcutaneous tissue, Monocryl for the skin, Steri-Strips and sterile gauze. The wounds were injected.  We then turned the patient supine, after applying sterile dressings, and positioned him for the partial knee replacement.  The left leg was placed in a leg holder, and the leg was well padded. Care was taken to position the hip in appropriate position. The leg was elevated and exsanguinated and the tourniquet was inflated. Anteromedial incision was performed, and I took care to preserve the MCL. Parapatellar incision was carried out, and the osteophytes were excised, along with the medial meniscus and a small portion of the fat pad.  The extra medullary tibial cutting jig was applied, using the spoon and the 4mm G-Clamp, and I took care to protect the anterior cruciate ligament insertion and the tibial spine. The medial collateral ligament was also protected, and I resected my proximal tibia, matching his anatomic slope.  The proximal tibial bony cut was removed in one piece, and I turned my attention to the femur.  The intramedullary femoral rod was placed using the drill, and then using the appropriate reference, I  assembled the femoral jig, setting my posterior cutting block. I resected my posterior femur, and then measured my gap.   I then used the mill to match the extension gap to the flexion gap. Initially, the flexion gap measured 4 mm, and the extension gap measured 0. I used a 4 mill. I then tested again, and the extension was still tight, so I milled with a 5 The gaps were then measured again with the appropriate feeler gauges. Once I had balanced flexion and extension gaps, I then completed the preparation of the femur.  I milled off the anterior aspect of the distal femur to prevent impingement. I also exposed the tibia, and selected the above-named component, and then used the cutting jig to prepare the keel slot on the tibia. I also used the awl to curette out the bone to complete the preparation of the keel. The back wall was intact.  I then placed trial components, and it was found to have excellent motion, and appropriate balance.  I then cemented the components into place, cementing the tibia first, removing all excess cement, and then cementing the femur.  All loose cement was removed.  The real polyethylene insert was applied manually, and the knee was taken through functional range of motion, and  found to have excellent stability and restoration of joint motion, with excellent balance.  The wounds were irrigated copiously, and the parapatellar tissue closed with Vicryl, followed by Vicryl for the subcutaneous tissue, with routine closure with Steri-Strips and sterile gauze.  The tourniquet was released, and the patient was awakened and extubated and returned to PACU in stable and satisfactory condition. There were no complications.   Teryl Lucy, MD Orthopedic Surgeon [phone]   11/20/2012 9:49 AM

## 2012-11-20 NOTE — Plan of Care (Signed)
Problem: Consults Goal: Diagnosis- Total Joint Replacement Partial/Uniknee: Left     

## 2012-11-20 NOTE — Progress Notes (Signed)
UR COMPLETED  

## 2012-11-20 NOTE — Preoperative (Signed)
Beta Blockers   Reason not to administer Beta Blockers:B blocker taken 11/20/12 @0500 

## 2012-11-20 NOTE — H&P (Signed)
PREOPERATIVE H&P  Chief Complaint: DJD LEFT KNEE, DJD LEFT HIP  HPI: Phillip [name] is a 62 y.o. male who presents for preoperative history and physical with a diagnosis of DJD LEFT KNEE, DJD LEFT HIP. Symptoms are rated as moderate to severe, and have been worsening.  This is significantly impairing activities of daily living.  He has elected for surgical management. He has failed injections, activity modification, nsaids, etc.   Past Medical History  Diagnosis Date  . Anemia   . Arthritis   . Diabetes mellitus   . Hyperlipidemia   . Hypertension   . Dysrhythmia   . Sleep apnea     cpap  . Gout    Past Surgical History  Procedure Laterality Date  . Cholecystectomy  1997  . Knee arthoscopy  2006    Left Knee  . Tonsillectomy and adenoidectomy      Childhood   History   Social History  . Marital Status: Married    Spouse Name: N/A    Number of Children: N/A  . Years of Education: N/A   Social History Main Topics  . Smoking status: Never Smoker   . Smokeless tobacco: None  . Alcohol Use: No  . Drug Use: No  . Sexually Active: None   Other Topics Concern  . None   Social History Narrative  . None   Family History  Problem Relation Age of Onset  . Cancer Mother     Breast   Allergies  Allergen Reactions  . Sulfa Antibiotics Other (See Comments)    Unknown reaction   Prior to Admission medications   Medication Sig Start Date End Date Taking? Authorizing Provider  allopurinol (ZYLOPRIM) 300 MG tablet Take 1 tablet (300 mg total) by mouth daily. 05/04/12  Yes Chelle S Jeffery, PA-C  aspirin 81 MG tablet Take 81 mg by mouth daily.  06/01/11  Yes Historical Provider, MD  Choline Fenofibrate (TRILIPIX) 135 MG capsule Take 1 capsule (135 mg total) by mouth daily. 02/02/12  Yes Collene Gobble, MD  losartan-hydrochlorothiazide (HYZAAR) 100-25 MG per tablet Take 1 tablet by mouth daily. 05/25/12  Yes Collene Gobble, MD  metFORMIN (GLUCOPHAGE) 1000 MG tablet Take 1 tablet  (1,000 mg total) by mouth 2 (two) times daily with a meal. 12/29/11  Yes Collene Gobble, MD  metoprolol (LOPRESSOR) 100 MG tablet Take 100 mg by mouth 2 (two) times daily.   Yes Historical Provider, MD  saxagliptin HCl (ONGLYZA) 5 MG TABS tablet Take 1 tablet (5 mg total) by mouth daily. 06/24/12  Yes Collene Gobble, MD  zolpidem (AMBIEN) 5 MG tablet Take 5 mg by mouth at bedtime as needed. For sleep 04/16/12  Yes Collene Gobble, MD  fluticasone (FLONASE) 50 MCG/ACT nasal spray Place 2 sprays into the nose daily as needed. For nasal congestion 02/01/12   Collene Gobble, MD  typhoid (VIVOTIF BERNA VACCINE) DR capsule Use as directed 09/12/12   Elvina Sidle, MD     Positive ROS: All other systems have been reviewed and were otherwise negative with the exception of those mentioned in the HPI and as above.  Physical Exam: General: Alert, no acute distress Cardiovascular: No pedal edema Respiratory: No cyanosis, no use of accessory musculature GI: No organomegaly, abdomen is soft and non-tender Skin: No lesions in the area of chief complaint Neurologic: Sensation intact distally Psychiatric: Patient is competent for consent with normal mood and affect Lymphatic: No axillary or cervical lymphadenopathy  MUSCULOSKELETAL:  left knee with medial pain, and positive stiffness and loss of motion of the hip.  EHL, [facility] firing.  Assessment: DJD LEFT KNEE, DJD LEFT HIP  Plan: Plan for Procedure(s): UNICOMPARTMENTAL KNEE TOTAL HIP ARTHROPLASTY  The risks benefits and alternatives were discussed with the patient including but not limited to the risks of nonoperative treatment, versus surgical intervention including infection, bleeding, nerve injury,  blood clots, cardiopulmonary complications, morbidity, mortality, among others, and they were willing to proceed.   Eulas Post, MD Cell [phone]   11/20/2012 7:23 AM

## 2012-11-21 ENCOUNTER — Encounter (HOSPITAL_COMMUNITY): Payer: Self-pay | Admitting: Orthopedic Surgery

## 2012-11-21 LAB — CBC
HCT: 31.6 % — ABNORMAL LOW (ref 39.0–52.0)
Hemoglobin: 10.4 g/dL — ABNORMAL LOW (ref 13.0–17.0)
MCHC: 32.9 g/dL (ref 30.0–36.0)
RDW: 12.6 % (ref 11.5–15.5)
WBC: 6.5 10*3/uL (ref 4.0–10.5)

## 2012-11-21 LAB — BASIC METABOLIC PANEL
BUN: 16 mg/dL (ref 6–23)
Chloride: 96 mEq/L (ref 96–112)
GFR calc Af Amer: 80 mL/min — ABNORMAL LOW (ref >90)
GFR calc non Af Amer: 69 mL/min — ABNORMAL LOW (ref >90)
Glucose, Bld: 201 mg/dL — ABNORMAL HIGH (ref 70–99)
Potassium: 3.6 mEq/L (ref 3.5–5.1)
Sodium: 132 mEq/L — ABNORMAL LOW (ref 135–145)

## 2012-11-21 LAB — GLUCOSE, CAPILLARY
Glucose-Capillary: 176 mg/dL — ABNORMAL HIGH (ref 70–99)
Glucose-Capillary: 164 mg/dL — ABNORMAL HIGH (ref 70–99)
Glucose-Capillary: 183 mg/dL — ABNORMAL HIGH (ref 70–99)

## 2012-11-21 NOTE — Progress Notes (Signed)
Met with patient at bedside to explain Link to Wellness program for employees with [facility] insurance. Explained the benefits of the program. Left Link to Wellness packet as well as brochure and contact information. Confirmed best contact number post discharge to call to see if interested in being in the Link to Wellness program. Appreciative of visit.   Raiford Noble, MSN- Ed, RN,BSN - [facility] [phone]

## 2012-11-21 NOTE — Progress Notes (Signed)
Physical Therapy Treatment Patient Details Name: Phillip [name] MRN: [id number] DOB: Jun 19, 1950 Today's Date: 11/21/2012 Time: [phone] PT Time Calculation (min): 30 min  PT Assessment / Plan / Recommendation  History of Present Illness s/p elective L uniknee and L THA posterior approach   Clinical Impression Pt making steady progress with therapy. Able to increase amb distance and participate in theraex this session. Will plan to address stair goal during session tomorrow. Anticipate steady progress. Pt given cues and instruction on HEP to reduce risk of becoming "sore or stiff" tonight; pt verbalized and demo understanding of HEP that was reviewed.    PT Comments   Pt to continue to benefit from skilled PT.   Follow Up Recommendations  Home health PT;Supervision/Assistance - 24 hour     Does the patient have the potential to tolerate intense rehabilitation     Barriers to Discharge        Equipment Recommendations  Rolling walker with 5" wheels;3in1 (PT)    Recommendations for Other Services OT consult  Frequency 7X/week   Progress towards PT Goals Progress towards PT goals: Progressing toward goals  Plan Current plan remains appropriate    Precautions / Restrictions Precautions Precautions: Posterior Hip;Fall Precaution Comments: reviewed posterior hip precautions; pt able to independently recall 2/3  Restrictions Weight Bearing Restrictions: Yes LLE Weight Bearing: Weight bearing as tolerated   Pertinent Vitals/Pain 6/10 primarily in knee; c/o hip pain with amb.     Mobility  Bed Mobility Bed Mobility: Not assessed (pt sitting in chair and returned to chair) Transfers Transfers: Sit to Stand;Stand to Sit Sit to Stand: 4: Min assist;From chair/3-in-1;With armrests;With upper extremity assist Stand to Sit: 4: Min assist;To chair/3-in-1;With armrests Details for Transfer Assistance: (A) to achieve sit to stand from lower surface while maintaining hip precautions;  required min cues fto demo correct form and adhere to hip precautions; pt required (A) to ext Rt LE while sitting to reduce risk of fwd flex more than 90degrees Ambulation/Gait Ambulation/Gait Assistance: 5: Supervision Ambulation Distance (Feet): 100 Feet Assistive device: Rolling walker Ambulation/Gait Assistance Details: vc's for gt sequencing; pt able to progress to step through gt; encouraged to turn Lt to increase adherence to hip precautions; pt required 2 standing rest breaks due to fatigue in UEs; pt given min cues for RW management Gait Pattern: Step-through pattern;Decreased step length - right;Decreased stance time - left Gait velocity: decreased  Stairs: No Wheelchair Mobility Wheelchair Mobility: No    Exercises Total Joint Exercises Ankle Circles/Pumps: 10 reps;Seated Gluteal Sets: 10 reps;Seated Heel Slides: AROM;10 reps;Left;Seated Long Arc Quad: AAROM;Left;10 reps;Seated   PT Diagnosis:    PT Problem List:   PT Treatment Interventions:     PT Goals (current goals can now be found in the care plan section) Acute Rehab PT Goals Patient Stated Goal: to go home  PT Goal Formulation: With patient Time For Goal Achievement: 11/28/12 Potential to Achieve Goals: Good  Visit Information  Last PT Received On: 11/21/12 Assistance Needed: +1 History of Present Illness: s/p elective L uniknee and L THA posterior approach    Subjective Data  Subjective: pt sitting in chair; agreeable to therapy. states "we can walk, i was just loosening up my back so id be ready for you" Patient Stated Goal: to go home    Cognition  Cognition Arousal/Alertness: Awake/alert Behavior During Therapy: WFL for tasks assessed/performed Overall Cognitive Status: Within Functional Limits for tasks assessed    Balance  Balance Balance Assessed: Yes Static Standing  Balance Static Standing - Balance Support: Bilateral upper extremity supported;During functional activity Static Standing -  Level of Assistance: 5: Stand by assistance  End of Session PT - End of Session Equipment Utilized During Treatment: Gait belt Activity Tolerance: Patient tolerated treatment well Patient left: in chair;with bed alarm set;with family/visitor present Nurse Communication: Mobility status   GP     [name], [place] [phone] 11/21/2012, 2:39 PM

## 2012-11-21 NOTE — Evaluation (Addendum)
Physical Therapy Evaluation Patient Details Name: Phillip [name] MRN: [id number] DOB: November 22, 1950 Today's Date: 11/21/2012 Time: 4098-1191 PT Time Calculation (min): 29 min  PT Assessment / Plan / Recommendation History of Present Illness  s/p elective L uniknee and L THA posterior approach  Clinical Impression  Pt is s/p L uniknee and L posterior THA resulting in the deficits listed below (see PT Problem List). Pt will benefit from skilled PT to increase their independence and safety with mobility to allow discharge to the venue listed below. Anticpate steady progress from highly motivated pt and D/C home with HHPT when medically ready and all PT goals have been met.      PT Assessment  Patient needs continued PT services    Follow Up Recommendations  Home health PT;Supervision/Assistance - 24 hour    Does the patient have the potential to tolerate intense rehabilitation      Barriers to Discharge        Equipment Recommendations  Rolling walker with 5" wheels;3in1 (PT)    Recommendations for Other Services OT consult   Frequency 7X/week    Precautions / Restrictions Precautions Precautions: Posterior Hip;Fall Precaution Booklet Issued: Yes (comment) pt given and reviewed handout of posterior hip precautions Restrictions Weight Bearing Restrictions: Yes LLE Weight Bearing: Weight bearing as tolerated   Pertinent Vitals/Pain 6/10; knee pain > hip      Mobility  Bed Mobility Bed Mobility: Supine to Sit;Sitting - Scoot to Edge of Bed Supine to Sit: 4: Min assist;HOB elevated;With rails Sitting - Scoot to Edge of Bed: 4: Min guard Details for Bed Mobility Assistance: (A) to advance L LE to EOB; pt requires increased time; vc's for hand placement and sequencing Transfers Transfers: Sit to Stand;Stand to Sit Sit to Stand: 4: Min assist;From bed;With upper extremity assist Stand to Sit: 4: Min assist;To chair/3-in-1;With armrests Details for Transfer Assistance: (A) to  achieve upright position while maintaining balance; pt with increased pain with activity; verbal cues for hand placement and to maintain hip precautions; pt has difficulty sliding L LE out when sitting to adhere to hip preacutions, requires facilitation Ambulation/Gait Ambulation/Gait Assistance: 4: Min guard Ambulation Distance (Feet): 60 Feet Assistive device: Rolling walker Ambulation/Gait Assistance Details: vc's for gt sequencing and to increase heel strike on L LE; pt has tendency to pick up RW; verbal cues for management of RW and to maintain hip precautions with turns  Gait Pattern: Step-to pattern;Decreased stance time - left;Decreased step length - right Gait velocity: decreased  Stairs: No Wheelchair Mobility Wheelchair Mobility: No    Exercises Total Joint Exercises Ankle Circles/Pumps: AROM;10 reps;Both;Supine Quad Sets: AROM;Left;10 reps;Seated Hip ABduction/ADduction: AAROM;Left;10 reps;Seated Long Arc Quad: AAROM;Left;10 reps;Seated Goniometric ROM: 2 to 50 degrees AAROM in sitting    PT Diagnosis: Difficulty walking;Acute pain  PT Problem List: Decreased strength;Decreased range of motion;Decreased activity tolerance;Decreased balance;Decreased mobility;Decreased knowledge of use of DME;Pain;Decreased knowledge of precautions PT Treatment Interventions: DME instruction;Gait training;Stair training;Functional mobility training;Therapeutic activities;Therapeutic exercise;Balance training;Neuromuscular re-education;Patient/family education     PT Goals(Current goals can be found in the care plan section) Acute Rehab PT Goals Patient Stated Goal: to go home  PT Goal Formulation: With patient Time For Goal Achievement: 11/28/12 Potential to Achieve Goals: Good  Visit Information  Last PT Received On: 11/21/12 Assistance Needed: +1 PT/OT Co-Evaluation/Treatment: Yes History of Present Illness: s/p elective L uniknee and L THA posterior approach       Prior  Functioning  Home Living Family/patient expects to be discharged to::  Private residence Living Arrangements: Spouse/significant other Available Help at Discharge: Available 24 hours/day;Family Type of Home: House Home Access: Stairs to enter [employer] of Steps: 2 Entrance Stairs-Rails: None Home Layout: Two level;Able to live on main level with bedroom/bathroom;Full bath on main level Home Equipment: Shower seat;Hand held shower head (has reacher) Additional Comments: pt will be able to live on main floor; wife is "somewhat" able to (A) pt when he returns home Prior Function Level of Independence: Independent Communication Communication: No difficulties Dominant Hand: Right    Cognition  Cognition Arousal/Alertness: Awake/alert Behavior During Therapy: WFL for tasks assessed/performed Overall Cognitive Status: Within Functional Limits for tasks assessed    Extremity/Trunk Assessment Upper Extremity Assessment Upper Extremity Assessment: Defer to OT evaluation Lower Extremity Assessment Lower Extremity Assessment: LLE deficits/detail LLE Deficits / Details: unable to perform L LAQ;  grossly 2/5; limited by pain LLE: Unable to fully assess due to pain LLE Sensation:  (WFL to light touch) Cervical / Trunk Assessment Cervical / Trunk Assessment: Normal   Balance Balance Balance Assessed: Yes Static Sitting Balance Static Sitting - Balance Support: Bilateral upper extremity supported;Feet supported Static Sitting - Level of Assistance: 5: Stand by assistance  End of Session PT - End of Session Equipment Utilized During Treatment: Gait belt Activity Tolerance: Patient tolerated treatment well Patient left: in chair;with call bell/phone within reach;with family/visitor present Nurse Communication: Mobility status  GP     [name], [place] [phone] 11/21/2012, 10:26 AM

## 2012-11-21 NOTE — Progress Notes (Signed)
Patient ID: Phillip [name], male   DOB: 07/09/1950, 62 y.o.   MRN: [id number]     Subjective:  Patient reports pain as mild to moderate.  Patient denies any CP or SOB sitting up in bed with breakfast.  Objective:   VITALS:   Filed Vitals:   11/20/12 1600 11/20/12 2100 11/21/12 0120 11/21/12 0655  BP:  142/73 115/59 119/78  Pulse:  71 69 62  Temp:  98.2 F (36.8 C) 98.2 F (36.8 C) 99 F (37.2 C)  TempSrc:  Oral Oral Oral  Resp: 18 18 18 18   SpO2:  98% 98% 98%    ABD soft Sensation intact distally Dorsiflexion/Plantar flexion intact Incision: dressing C/D/I and no drainage on either the hip ar the knee wounds.   Lab Results  Component Value Date   WBC 6.5 11/21/2012   HGB 10.4* 11/21/2012   HCT 31.6* 11/21/2012   MCV 89.5 11/21/2012   PLT 247 11/21/2012     Assessment/Plan: 1 Day Post-Op   Principal Problem:   Osteoarthritis of left hip Active Problems:   Localized osteoarthritis of left knee   Advance diet Up with therapy Plan for use of PO pain meds. Okay to DC abduction pillow Plan dressing change tomorrow WBAT left lower ext.    Haskel Khan 11/21/2012, 7:28 AM   Teryl Lucy, MD Cell [phone]

## 2012-11-21 NOTE — Progress Notes (Signed)
11/21/12 Patient set up with HHRN, HHPT and HHOT with [facility] by MD office. Equipment set up with [employer]. Spoke with patient and confirmed no changes in d/c plan. Patient's wife will be availbale to assist patient after d/c. Will continue to follow for d/c needs. Jacquelynn Cree RN,BSN, CCM

## 2012-11-21 NOTE — Evaluation (Signed)
Occupational Therapy Evaluation Patient Details Name: Phillip [name] MRN: [id number] DOB: 09-14-1950 Today's Date: 11/21/2012 Time: 4098-1191 OT Time Calculation (min): 18 min  OT Assessment / Plan / Recommendation History of present illness s/p elective L uniknee and L THA posterior approach   Clinical Impression   Pt s/p L uniknee and L THA posterior approach. Pt will benefit from continued acute OT services to address below problem list in prep for safe return home.    OT Assessment  Patient needs continued OT Services    Follow Up Recommendations  Home health OT;Supervision/Assistance - 24 hour    Barriers to Discharge      Equipment Recommendations  None recommended by OT    Recommendations for Other Services    Frequency  Min 2X/week    Precautions / Restrictions Precautions Precautions: Posterior Hip;Fall Precaution Comments: Pt able to recall 2/3 posterior hip precautions from earlier PT session. Restrictions Weight Bearing Restrictions: Yes LLE Weight Bearing: Weight bearing as tolerated   Pertinent Vitals/Pain See vitals    ADL  Eating/Feeding: Performed;Independent Where Assessed - Eating/Feeding: Chair Grooming: Performed;Wash/dry face;Set up Where Assessed - Grooming: Unsupported sitting Upper Body Bathing: Simulated;Set up Where Assessed - Upper Body Bathing: Unsupported sitting Lower Body Bathing: Simulated;Moderate assistance (without AE) Where Assessed - Lower Body Bathing: Supported sit to stand Upper Body Dressing: Simulated;Set up Where Assessed - Upper Body Dressing: Unsupported sit to stand Lower Body Dressing: Simulated;Maximal assistance (without AE) Where Assessed - Lower Body Dressing: Supported sit to stand Toilet Transfer: Mining engineer Method: Sit to Barista:  (chair) Equipment Used: Rolling walker;Gait belt Transfers/Ambulation Related to ADLs: min assist for sit<>stand. ADL  Comments: Pt fatigued from recently finishing with second PT session.    OT Diagnosis: Generalized weakness;Acute pain  OT Problem List: Decreased strength;Decreased activity tolerance;Impaired balance (sitting and/or standing);Decreased knowledge of use of DME or AE;Decreased knowledge of precautions;Pain OT Treatment Interventions: Self-care/ADL training;DME and/or AE instruction;Therapeutic activities;Patient/family education;Balance training   OT Goals(Current goals can be found in the care plan section) Acute Rehab OT Goals Patient Stated Goal: to go home  OT Goal Formulation: With patient Time For Goal Achievement: 11/28/12 Potential to Achieve Goals: Good  Visit Information  Last OT Received On: 11/21/12 Assistance Needed: +1 History of Present Illness: s/p elective L uniknee and L THA posterior approach       Prior Functioning     Home Living Family/patient expects to be discharged to:: Private residence Living Arrangements: Spouse/significant other Available Help at Discharge: Available 24 hours/day;Family Type of Home: House Home Access: Stairs to enter [employer] of Steps: 2 Entrance Stairs-Rails: None Home Layout: Two level;Able to live on main level with bedroom/bathroom;Full bath on main level Home Equipment: Bedside commode;Shower seat;Hand held Brewing technologist) Additional Comments: pt will be able to live on main floor; wife is "somewhat" able to (A) pt when he returns home Prior Function Level of Independence: Independent Communication Communication: No difficulties Dominant Hand: Right         Vision/Perception     Cognition  Cognition Arousal/Alertness: Awake/alert Behavior During Therapy: WFL for tasks assessed/performed Overall Cognitive Status: Within Functional Limits for tasks assessed    Extremity/Trunk Assessment Upper Extremity Assessment Upper Extremity Assessment: Overall WFL for tasks assessed     Mobility Bed  Mobility Bed Mobility: Not assessed Details for Bed Mobility Assistance: pt up in chair Transfers Transfers: Sit to Stand;Stand to Sit Sit to Stand: 4: Min assist;From chair/3-in-1;With upper  extremity assist;With armrests Stand to Sit: 4: Min assist;To chair/3-in-1;To elevated surface;With upper extremity assist Details for Transfer Assistance: Assist for steadying while transitioning UEs from armrests to RW.  VCs for technique and hand placement.     Exercise    Balance    End of Session OT - End of Session Equipment Utilized During Treatment: Gait belt;Rolling walker Activity Tolerance: Patient limited by fatigue Patient left: in chair;with call bell/phone within reach;with family/visitor present Nurse Communication: Mobility status  GO    11/21/2012 Cipriano Mile OTR/L Pager [phone] Office [phone]  Cipriano Mile 11/21/2012, 4:07 PM

## 2012-11-22 LAB — CBC
HCT: 29.4 % — ABNORMAL LOW (ref 39.0–52.0)
MCH: 30.2 pg (ref 26.0–34.0)
MCHC: 34.4 g/dL (ref 30.0–36.0)
RDW: 12.5 % (ref 11.5–15.5)

## 2012-11-22 LAB — BASIC METABOLIC PANEL
BUN: 13 mg/dL (ref 6–23)
Calcium: 8.9 mg/dL (ref 8.4–10.5)
GFR calc Af Amer: 87 mL/min — ABNORMAL LOW (ref >90)
GFR calc non Af Amer: 75 mL/min — ABNORMAL LOW (ref >90)
Potassium: 4.1 mEq/L (ref 3.5–5.1)
Sodium: 129 mEq/L — ABNORMAL LOW (ref 135–145)

## 2012-11-22 LAB — GLUCOSE, CAPILLARY: Glucose-Capillary: 205 mg/dL — ABNORMAL HIGH (ref 70–99)

## 2012-11-22 NOTE — Progress Notes (Signed)
Physical Therapy Treatment Patient Details Name: Phillip [name] MRN: [id number] DOB: 11-Dec-1950 Today's Date: 11/22/2012 Time: 1478-2956 PT Time Calculation (min): 27 min  PT Assessment / Plan / Recommendation  History of Present Illness s/p elective L uniknee and L THA posterior approach   Clinical Impression Pt making steady progress with PT. Able to attempt step today with min guard and vc's for sequencing. Pt given handout and wife educated on stair amb sequencing. Will need to practice in next session prior to D/C to ensure carryover and safety. Anticipate D/C tomorrow if medically ready.    PT Comments   Pt progressing well; continues to benefit from skilled PT.   Follow Up Recommendations  Home health PT;Supervision/Assistance - 24 hour     Does the patient have the potential to tolerate intense rehabilitation     Barriers to Discharge        Equipment Recommendations  Rolling walker with 5" wheels;3in1 (PT)    Recommendations for Other Services    Frequency 7X/week   Progress towards PT Goals Progress towards PT goals: Progressing toward goals  Plan Current plan remains appropriate    Precautions / Restrictions Precautions Precautions: Posterior Hip;Fall Precaution Comments: reviewed hip precautions; pt able to recall 2/3 independently Restrictions Weight Bearing Restrictions: Yes LLE Weight Bearing: Weight bearing as tolerated   Pertinent Vitals/Pain Pain primarily in knee. Did not rate pain this afternoon.     Mobility  Bed Mobility Bed Mobility: Not assessed Details for Bed Mobility Assistance: pt sitting in chair and returned to chair; states "the bed hurts me" Transfers Transfers: Sit to Stand;Stand to Sit Sit to Stand: 4: Min guard;From chair/3-in-1;With armrests Stand to Sit: 4: Min guard;To chair/3-in-1;With armrests Details for Transfer Assistance: min guard for safety and balance; pt has difficulty standing from lower surfaced recliner chair; vc's to  adhere to hip precautions with stand to sit; pt has difficulty extending L knee sit safely; requires min guard for sequencing and safety  Ambulation/Gait Ambulation/Gait Assistance: 5: Supervision Ambulation Distance (Feet): 150 Feet Assistive device: Rolling walker Ambulation/Gait Assistance Details: min cues for upright posture and gt sequencing; pt more consistent with step through gt sequencing; at times picks RW up, correctable with cues; pt c/o LBP with amb which make him require standing rest breaks   Gait Pattern: Step-to pattern;Decreased stance time - left;Decreased step length - right;Narrow base of support;Step-through pattern Gait velocity: decreased  Stairs: Yes Stairs Assistance: 4: Min guard;5: Supervision Stairs Assistance Details (indicate cue type and reason): verbal cues and demo for proper gt sequencing and safety; gave pt handout of instructions  Stair Management Technique: No rails;Step to pattern;Backwards;With walker Number of Stairs: 1    Exercises Total Joint Exercises Knee Flexion: Standing;10 reps;Left   PT Diagnosis:    PT Problem List:   PT Treatment Interventions:     PT Goals (current goals can now be found in the care plan section) Acute Rehab PT Goals Patient Stated Goal: to go home  PT Goal Formulation: With patient Time For Goal Achievement: 11/28/12 Potential to Achieve Goals: Good  Visit Information  Last PT Received On: 11/22/12 Assistance Needed: +1 History of Present Illness: s/p elective L uniknee and L THA posterior approach    Subjective Data  Subjective: pt sitting in chair; wife present; agreeable to therapy. states "i just got in the chair but i need this. Im going to have to go home to get sleep" Patient Stated Goal: to go home    Cognition  Cognition Arousal/Alertness: Awake/alert Behavior During Therapy: WFL for tasks assessed/performed Overall Cognitive Status: Within Functional Limits for tasks assessed    Balance   Balance Balance Assessed: No  End of Session PT - End of Session Equipment Utilized During Treatment: Gait belt Activity Tolerance: Patient tolerated treatment well Patient left: in chair;with call bell/phone within reach;with family/visitor present Nurse Communication: Mobility status   GP     Phillip [name] , [place] [phone]  11/22/2012, 2:37 PM

## 2012-11-22 NOTE — Progress Notes (Signed)
Patient ID: Phillip [name], male   DOB: [birth year], 62 y.o.   MRN: [id number]     Subjective:  Patient reports pain as mild to moderate.  Patient states that he is having more pain today but over all is doing well  Objective:   VITALS:   Filed Vitals:   11/21/12 1321 11/21/12 1600 11/21/12 2119 11/21/12 2255  BP: 122/75  128/65   Pulse: 72  77 75  Temp: 98.7 F (37.1 C)  99.2 F (37.3 C)   TempSrc: Oral  Oral   Resp: 18 18 18 18   SpO2: 96%  98% 98%    ABD soft Sensation intact distally Dorsiflexion/Plantar flexion intact Incision: dressing C/D/I and no drainage Both hip and knee wounds are clean and dry no sign of infection.    Lab Results  Component Value Date   WBC 8.0 11/22/2012   HGB 10.1* 11/22/2012   HCT 29.4* 11/22/2012   MCV 88.0 11/22/2012   PLT 216 11/22/2012     Assessment/Plan: 2 Days Post-Op   Principal Problem:   Osteoarthritis of left hip Active Problems:   Localized osteoarthritis of left knee   Advance diet Up with therapy Plan for discharge tomorrow WBAT  Dry dressing PRN   Haskel Khan 11/22/2012, 8:18 AM   Teryl Lucy, MD Cell [phone]

## 2012-11-22 NOTE — Progress Notes (Signed)
Physical Therapy Treatment Patient Details Name: Phillip [name] MRN: [id number] DOB: 10/16/1950 Today's Date: 11/22/2012 Time: 7829-5621 PT Time Calculation (min): 30 min  PT Assessment / Plan / Recommendation  History of Present Illness s/p elective L uniknee and L THA posterior approach   Clinical Impression Pt progressing in therapy. Limited today mildly due to pain and nausea but still able to progress amb distance and participate in theraex. Pt highly motivated. Will attempt stair amb during next session while progressing amb distance and independence with mobility.    PT Comments   Pt continues to benefit from Skilled PT.   Follow Up Recommendations  Home health PT;Supervision/Assistance - 24 hour     Does the patient have the potential to tolerate intense rehabilitation     Barriers to Discharge        Equipment Recommendations  Rolling walker with 5" wheels;3in1 (PT)    Recommendations for Other Services    Frequency 7X/week   Progress towards PT Goals Progress towards PT goals: Progressing toward goals  Plan Current plan remains appropriate    Precautions / Restrictions Precautions Precautions: Posterior Hip;Fall Precaution Comments: pt able to recall 2/3 precautions  Restrictions Weight Bearing Restrictions: Yes LLE Weight Bearing: Weight bearing as tolerated   Pertinent Vitals/Pain 6/10 primarily in knee today     Mobility  Bed Mobility Bed Mobility: Not assessed Details for Bed Mobility Assistance: pt sitting in chair and returned to chair Transfers Transfers: Sit to Stand;Stand to Sit Sit to Stand: 4: Min assist;From chair/3-in-1;With armrests;With upper extremity assist Stand to Sit: 4: Min assist;To chair/3-in-1;With armrests;Without upper extremity assist Details for Transfer Assistance: (A) to achieve standing from lower level surfaces; requires (A) to adhere to hip precautions with sitting; min vc's for hand placement and  sequencing Ambulation/Gait Ambulation/Gait Assistance: 5: Supervision Ambulation Distance (Feet): 120 Feet Assistive device: Rolling walker Ambulation/Gait Assistance Details: pt with difficulty maintaining step through gt today; states he is "more sore". verbal cues to increase heel strike on L LE ; pt amb with decreased knee flexion; min cues to maintain hip precautions with turns  Gait Pattern: Step-to pattern;Decreased stance time - left;Decreased step length - right;Narrow base of support Gait velocity: decreased  Stairs: No Wheelchair Mobility Wheelchair Mobility: No    Exercises Total Joint Exercises Ankle Circles/Pumps: 10 reps;Seated Quad Sets: AROM;Left;10 reps;Seated Gluteal Sets: 10 reps;Seated Heel Slides: AROM;10 reps;Left;Seated Hip ABduction/ADduction: AAROM;Left;10 reps;Seated Long Arc Quad: AAROM;Left;10 reps;Seated Goniometric ROM: knee flexion PROM to 70 degrees   PT Diagnosis:    PT Problem List:   PT Treatment Interventions:     PT Goals (current goals can now be found in the care plan section) Acute Rehab PT Goals Patient Stated Goal: to go home  PT Goal Formulation: With patient Time For Goal Achievement: 11/28/12 Potential to Achieve Goals: Good  Visit Information  Last PT Received On: 11/22/12 Assistance Needed: +1 History of Present Illness: s/p elective L uniknee and L THA posterior approach    Subjective Data  Subjective: pt sitting in chair; agreeable to therapy. reports he has been nauseous and very sore this morning.  Patient Stated Goal: to go home    Cognition  Cognition Arousal/Alertness: Awake/alert Behavior During Therapy: WFL for tasks assessed/performed Overall Cognitive Status: Within Functional Limits for tasks assessed    Balance  Balance Balance Assessed: No  End of Session PT - End of Session Equipment Utilized During Treatment: Gait belt Activity Tolerance: Patient tolerated treatment well Patient left: in chair;with  call bell/phone within reach Nurse Communication: Mobility status   GP     [name], [place] [phone] 11/22/2012, 10:11 AM

## 2012-11-22 NOTE — Progress Notes (Signed)
Occupational Therapy Treatment Patient Details Name: Phillip [name] MRN: [id number] DOB: 27-Dec-1950 Today's Date: 11/22/2012 Time: 4098-1191 OT Time Calculation (min): 19 min  OT Assessment / Plan / Recommendation  History of present illness s/p elective L uniknee and L THA posterior approach   Clinical Impression Pt making progress with ADLs and ADL mobility and required decreased assist with these tasks. Pt should continue with acute OT services to maximize level of function and safety to return home   OT comments  Pt pleasant and cooperative  Follow Up Recommendations  Home health OT;Supervision/Assistance - 24 hour    Barriers to Discharge       Equipment Recommendations  None recommended by OT    Recommendations for Other Services    Frequency Min 2X/week   Progress towards OT Goals Progress towards OT goals: Progressing toward goals  Plan Discharge plan remains appropriate    Precautions / Restrictions Precautions Precautions: Posterior Hip;Fall Precaution Comments: pt able to recall 3/3 hip precautions Restrictions Weight Bearing Restrictions: Yes LLE Weight Bearing: Weight bearing as tolerated   Pertinent Vitals/Pain 6/10 L knee/hip surgical areas    ADL  Grooming: Wash/dry hands;Wash/dry face;Set up;Supervision/safety Where Assessed - Grooming: Supported standing Lower Body Dressing: Performed;Moderate assistance Toilet Transfer: Performed;Minimal assistance Toilet Transfer Method: Sit to stand Toilet Transfer Equipment: Raised toilet seat with arms (or 3-in-1 over toilet) Equipment Used: Rolling walker;Gait belt ADL Comments: Pt fatigued from ADLs with nurse tech upon entering room but was able to complete with OT. Pt has A/E, 3 in 1 and shower chair at home from wife's previous knee surgery    OT Diagnosis:    OT Problem List:   OT Treatment Interventions:     OT Goals(current goals can now be found in the care plan section) Acute Rehab OT Goals Patient  Stated Goal: to go home   Visit Information  Last OT Received On: 11/22/12 Assistance Needed: +1 History of Present Illness: s/p elective L uniknee and L THA posterior approach    Subjective Data      Prior Functioning       Cognition  Cognition Arousal/Alertness: Awake/alert Behavior During Therapy: WFL for tasks assessed/performed Overall Cognitive Status: Within Functional Limits for tasks assessed    Mobility  Bed Mobility Bed Mobility: Not assessed Details for Bed Mobility Assistance: pt sitting in chair and returned to chair Transfers Sit to Stand: 4: Min assist;From chair/3-in-1;With armrests;With upper extremity assist Stand to Sit: 4: Min assist;To chair/3-in-1;With armrests;Without upper extremity assist Details for Transfer Assistance: (A) to achieve standing from lower level surfaces; requires (A) to adhere to hip precautions with sitting; min vc's for hand placement and sequencing       Balance Balance Balance Assessed: No   End of Session OT - End of Session Equipment Utilized During Treatment: Gait belt;Rolling walker Activity Tolerance: Patient limited by fatigue Patient left: in chair;with call bell/phone within reach;with family/visitor present  GO     Galen Manila 11/22/2012, 11:59 AM

## 2012-11-22 NOTE — Anesthesia Postprocedure Evaluation (Signed)
  Anesthesia Post-op Note  Patient: Phillip [name]  Procedure(s) Performed: Procedure(s): UNICOMPARTMENTAL KNEE (Left) TOTAL HIP ARTHROPLASTY (Left)  Patient Location: Nursing Unit  Anesthesia Type:General  Level of Consciousness: awake, alert  and oriented  Airway and Oxygen Therapy: Patient Spontanous Breathing  Post-op Pain: mild  Post-op Assessment: Post-op Vital signs reviewed and Patient's Cardiovascular Status Stable  Post-op Vital Signs: Reviewed and stable  Complications: No apparent anesthesia complications

## 2012-11-22 NOTE — Progress Notes (Signed)
Pt. Currently on CPAP via nasal prongs, auto titrate settings (4-20) with 2 lpm O2 bleed in. Pt. Is using mask/tubing from home and hospital machine.  Pt. Is able to place himself on/off when desired.

## 2012-11-23 LAB — CBC
HCT: 28.8 % — ABNORMAL LOW (ref 39.0–52.0)
Hemoglobin: 10 g/dL — ABNORMAL LOW (ref 13.0–17.0)
MCH: 30.6 pg (ref 26.0–34.0)
MCV: 88.1 fL (ref 78.0–100.0)
Platelets: 246 10*3/uL (ref 150–400)
RBC: 3.27 MIL/uL — ABNORMAL LOW (ref 4.22–5.81)
WBC: 7.4 10*3/uL (ref 4.0–10.5)

## 2012-11-23 MED ORDER — METAXALONE 800 MG PO TABS: 800.0000 mg | ORAL_TABLET | Freq: Four times a day (QID) | ORAL | Status: DC | PRN

## 2012-11-23 MED ORDER — METAXALONE 800 MG PO TABS
800.0000 mg | ORAL_TABLET | Freq: Four times a day (QID) | ORAL | Status: DC | PRN
  Filled 2012-11-23: qty 1

## 2012-11-23 NOTE — Progress Notes (Signed)
Physical Therapy Treatment Patient Details Name: Phillip [name] MRN: [id number] DOB: Jul 13, 1950 Today's Date: 11/23/2012 Time: 9147-8295 PT Time Calculation (min): 28 min  PT Assessment / Plan / Recommendation  History of Present Illness s/p elective L uniknee and L THA posterior approach   Clinical Impression Pt is supervision to Mod i for mobility. Requries min guard (A) for stair transfer to steady RW. Pt given handouts and educated on HEP and proper stair amb technique. Pt with questions regarding side sleeping technique and HEP. All questions answered. Pt verbalized understanding and feels comfortable and ready for D/C. Pt safe from PT standpoint to D/C today.    PT Comments   Pt progressing well and ready for D/C from mobility standpoint.   Follow Up Recommendations  Home health PT;Supervision/Assistance - 24 hour     Does the patient have the potential to tolerate intense rehabilitation     Barriers to Discharge        Equipment Recommendations  Rolling walker with 5" wheels;3in1 (PT)    Recommendations for Other Services    Frequency 7X/week   Progress towards PT Goals Progress towards PT goals: Progressing toward goals  Plan Current plan remains appropriate    Precautions / Restrictions Precautions Precautions: Posterior Hip Precaution Comments: reviewed posterior hip precautions; pt able to recall 3/3 precautions Restrictions Weight Bearing Restrictions: Yes LLE Weight Bearing: Weight bearing as tolerated   Pertinent Vitals/Pain "its easing off. Given ice packs to reduce pain"    Mobility  Bed Mobility Bed Mobility: Not assessed Transfers Transfers: Sit to Stand;Stand to Sit Sit to Stand: 5: Supervision;From chair/3-in-1 Stand to Sit: 5: Supervision;To chair/3-in-1 Details for Transfer Assistance: supervision for safety; requires increased time due to pain; able to recall safety and proper technique Ambulation/Gait Ambulation/Gait Assistance: 5:  Supervision Ambulation Distance (Feet): 200 Feet Assistive device: Rolling walker Ambulation/Gait Assistance Details: min cues for upright posture; pt with LBP which prevents upright posture at times; more consistent step through gt; cont to demo decreased knee flex in L LE with gt due to "soreness"; min cues for hip precautions due limit IR with turns   Gait Pattern: Step-through pattern;Decreased weight shift to left;Decreased hip/knee flexion - left;Trunk flexed Gait velocity: decreased  Stairs: Yes Stairs Assistance: 4: Min guard Stairs Assistance Details (indicate cue type and reason): pt able to independently recall gt sequencing; requires min cues for safety and (A) to steady RW Stair Management Technique: No rails;Step to pattern;Backwards;With walker Number of Stairs: 3 Wheelchair Mobility Wheelchair Mobility: No    Exercises Total Joint Exercises Ankle Circles/Pumps: 10 reps;Seated Quad Sets: AROM;Left;10 reps;Standing Heel Slides: AROM;10 reps;Left;Seated Hip ABduction/ADduction: AAROM;Left;10 reps;Seated Long Arc Quad: AAROM;10 reps;Seated;Other (comment) (taught self (A) technique for HEP) Marching in Standing: AROM;5 reps;Standing;Other (comment) (pt limited)   PT Diagnosis:    PT Problem List:   PT Treatment Interventions:     PT Goals (current goals can now be found in the care plan section) Acute Rehab PT Goals Patient Stated Goal: to go home  PT Goal Formulation: With patient Time For Goal Achievement: 11/28/12 Potential to Achieve Goals: Good  Visit Information  Last PT Received On: 11/23/12 Assistance Needed: +1 History of Present Illness: s/p elective L uniknee and L THA posterior approach    Subjective Data  Subjective: pt amb in hallway with PA; agreeable to therapy; "i hope to go home today. i am feeling and moving good" Patient Stated Goal: to go home    Cognition  Cognition Arousal/Alertness: Awake/alert  Behavior During Therapy: WFL for tasks  assessed/performed Overall Cognitive Status: Within Functional Limits for tasks assessed    Balance  Balance Balance Assessed: No  End of Session PT - End of Session Equipment Utilized During Treatment: Gait belt Activity Tolerance: Patient tolerated treatment well Patient left: in chair;with call bell/phone within reach;with family/visitor present Nurse Communication: Mobility status   GP     [name], [place] [phone] 11/23/2012, 9:17 AM

## 2012-11-23 NOTE — Discharge Summary (Signed)
Patient ID: CALIN FANTROY MRN: [id number] DOB/AGE: 64-16-1952 64 y.o.  Admit date: 11/20/2012 Discharge date: 11/23/2012  Admission Diagnoses:  Principal Problem:   Osteoarthritis of left hip Active Problems:   Type II or unspecified type diabetes mellitus without mention of complication, not stated as uncontrolled   Essential hypertension, benign   OSA (obstructive sleep apnea)   Gout   Hyperlipidemia   PVC (premature ventricular contraction)   Localized osteoarthritis of left knee   Discharge Diagnoses:  Same  Past Medical History  Diagnosis Date  . Anemia   . Arthritis   . Diabetes mellitus   . Hyperlipidemia   . Hypertension   . Sleep apnea     cpap  . Gout   . Osteoarthritis of left hip 11/20/2012  . Localized osteoarthritis of left knee 11/20/2012  . Dysrhythmia     pvc's  . Neuromuscular disorder     " mild periferal neauropathy    Surgeries: Procedure(s): UNICOMPARTMENTAL KNEE TOTAL HIP ARTHROPLASTY on 11/20/2012   Consultants:    Discharged Condition: Improved  Hospital Course: JERROLD HASKELL is an 62 y.o. male who was admitted 11/20/2012 for operative treatment ofOsteoarthritis of left hip. Patient has severe unremitting pain that affects sleep, daily activities, and work/hobbies. After pre-op clearance the patient was taken to the operating room on 11/20/2012 and underwent  Procedure(s): UNICOMPARTMENTAL KNEE TOTAL HIP ARTHROPLASTY.    Patient was given perioperative antibiotics: Anti-infectives   Start     Dose/Rate Route Frequency Ordered Stop   11/20/12 1600  ceFAZolin (ANCEF) IVPB 2 g/50 mL premix     2 g 100 mL/hr over 30 Minutes Intravenous Every 6 hours 11/20/12 1500 11/20/12 2235   11/20/12 0600  ceFAZolin (ANCEF) IVPB 2 g/50 mL premix     2 g 100 mL/hr over 30 Minutes Intravenous On call to O.R. 11/19/12 1404 11/20/12 1027       Patient was given sequential compression devices, early ambulation, and chemoprophylaxis to prevent  DVT.  Patient benefited maximally from hospital stay and there were no complications.    Recent vital signs: Patient Vitals for the past 24 hrs:  BP Temp Temp src Pulse Resp SpO2  11/23/12 0559 116/77 mmHg 99.1 F (37.3 C) - 68 18 96 %  11/22/12 2129 132/81 mmHg 98.1 F (36.7 C) - 83 18 93 %  11/22/12 1600 - - - - 17 97 %  11/22/12 1428 137/78 mmHg 99.4 F (37.4 C) Oral 71 18 97 %  11/22/12 1115 - - - - 18 99 %     Recent laboratory studies:  Recent Labs  11/21/12 0500 11/22/12 0510 11/23/12 0620  WBC 6.5 8.0 7.4  HGB 10.4* 10.1* 10.0*  HCT 31.6* 29.4* 28.8*  PLT 247 216 246  NA 132* 129*  --   K 3.6 4.1  --   CL 96 94*  --   CO2 27 27  --   BUN 16 13  --   CREATININE 1.11 1.03  --   GLUCOSE 201* 187*  --   CALCIUM 8.7 8.9  --      Discharge Medications:     Medication List         allopurinol 300 MG tablet  Commonly known as:  ZYLOPRIM  Take 1 tablet (300 mg total) by mouth daily.     aspirin 81 MG tablet  Take 81 mg by mouth daily.     Choline Fenofibrate 135 MG capsule  Commonly known as:  TRILIPIX  Take 1 capsule (135 mg total) by mouth daily.     enoxaparin 40 MG/0.4ML injection  Commonly known as:  LOVENOX  Inject 0.4 mLs (40 mg total) into the skin daily.     fluticasone 50 MCG/ACT nasal spray  Commonly known as:  FLONASE  Place 2 sprays into the nose daily as needed. For nasal congestion     losartan-hydrochlorothiazide 100-25 MG per tablet  Commonly known as:  HYZAAR  Take 1 tablet by mouth daily.     metaxalone 800 MG tablet  Commonly known as:  SKELAXIN  Take 1 tablet (800 mg total) by mouth every 6 (six) hours as needed.     metFORMIN 1000 MG tablet  Commonly known as:  GLUCOPHAGE  Take 1 tablet (1,000 mg total) by mouth 2 (two) times daily with a meal.     methocarbamol 500 MG tablet  Commonly known as:  ROBAXIN  Take 1 tablet (500 mg total) by mouth 4 (four) times daily.     metoprolol 100 MG tablet  Commonly known as:   LOPRESSOR  Take 100 mg by mouth 2 (two) times daily.     oxyCODONE-acetaminophen 10-325 MG per tablet  Commonly known as:  PERCOCET  Take 1-2 tablets by mouth every 6 (six) hours as needed for pain. MAXIMUM TOTAL ACETAMINOPHEN DOSE IS 4000 MG PER DAY     promethazine 25 MG tablet  Commonly known as:  PHENERGAN  Take 1 tablet (25 mg total) by mouth every 6 (six) hours as needed for nausea.     saxagliptin HCl 5 MG Tabs tablet  Commonly known as:  ONGLYZA  Take 1 tablet (5 mg total) by mouth daily.     typhoid DR capsule  Commonly known as:  VIVOTIF BERNA VACCINE  Use as directed     zolpidem 5 MG tablet  Commonly known as:  AMBIEN  Take 5 mg by mouth at bedtime as needed. For sleep        Diagnostic Studies: Dg Chest 2 View  11/06/2012   *RADIOLOGY REPORT*  Clinical Data: Preoperative evaluation for left total hip replacement, history diabetes, hypertension  CHEST - 2 VIEW  Comparison: None  Findings: Normal heart size, mediastinal contours, and pulmonary vascularity. Calcified granuloma left upper lobe. Question vague nodular density 5 mm diameter at lateral left lung base, potentially nipple shadow. No acute infiltrate, pleural effusion or pneumothorax. Thoracolumbar scoliosis with scattered mild degenerative disc disease changes.  IMPRESSION: Questionable left lung base nodule versus nipple shadow; repeat PA chest radiograph with nipple markers recommended to exclude pulmonary nodule.   Original Report Authenticated By: Ulyses Southward, M.D.   Dg Pelvis Portable  11/20/2012   *RADIOLOGY REPORT*  Clinical Data: Postop left hip arthroplasty  PORTABLE PELVIS  Comparison: 09/05/2011  Findings: There are immediate postoperative changes of left total hip arthroplasty with expected gas adjacent to the proximal femur. No hardware complication is identified.  No acute bony abnormality is seen.  IMPRESSION: Left total hip arthroplasty without complicating feature.   Original Report Authenticated By:  Britta Mccreedy, M.D.   Dg Hip Portable 1 View Left  11/20/2012   *RADIOLOGY REPORT*  Clinical Data: Hip replacement.  PORTABLE LEFT HIP - 1 VIEW  Comparison: Plain films of the hips 09/05/2011.  Findings: Study is limited by the patient's body habitus.  Left total hip arthroplasty is in place.  The device appears located and no fracture is identified.  IMPRESSION: Left hip replacement without evidence of complication.  Original Report Authenticated By: Holley Dexter, M.D.   Dg Knee Left Port  11/20/2012   *RADIOLOGY REPORT*  Clinical Data: Status post arthroplasty  PORTABLE LEFT KNEE - 1-2 VIEW  Comparison:  Sep 05, 2011  Findings: Frontal and lateral views were obtained.  The patient has had a medial hemiarthroplasty with prosthetic components appearing well seated.  There is no fracture or dislocation.  Air within the joint is an expected postoperative finding.  IMPRESSION: Postoperative change with prosthetic components well seated.  No fracture or dislocation.   Original Report Authenticated By: Bretta Bang, M.D.    Disposition:       Discharge Orders   Future Orders Complete By Expires     Call MD / Call 911  As directed     Comments:      If you experience chest pain or shortness of breath, CALL 911 and be transported to the hospital emergency room.  If you develope a fever above 101 F, pus (white drainage) or increased drainage or redness at the wound, or calf pain, call your surgeon's office.    Change dressing  As directed     Comments:      Change the dressing daily with sterile 4 x 4 inch gauze dressing and apply TED hose.  You may clean the incision with alcohol prior to redressing.    Constipation Prevention  As directed     Comments:      Drink plenty of fluids.  Prune juice may be helpful.  You may use a stool softener, such as Colace (over the counter) 100 mg twice a day.  Use MiraLax (over the counter) for constipation as needed.    Diet - low sodium heart healthy  As  directed     Discharge instructions  As directed     Comments:      Total hip and Partial Knee Replacement Care After Refer to this sheet in the next few weeks. These discharge instructions provide you with general information on caring for yourself after you leave the hospital. Your caregiver may also give you specific instructions. Your treatment has been planned according to the most current medical practices available, but unavoidable complications sometimes occur. If you have any problems or questions after discharge, please call your caregiver. Regaining a near full range of motion of your knee within the first 3 to 6 weeks after surgery is critical. HOME CARE INSTRUCTIONS  Follow hip precautions as taught by your physical therapist You may resume a normal diet and activities as directed.  Perform exercises as directed.  You will receive physical therapy daily  Take showers instead of baths until informed otherwise.  Change bandages (dressings)daily Do not take over-the-counter or prescription medicines for pain, discomfort, or fever. Eat a well-balanced diet.  Avoid lifting or driving until you are instructed otherwise.  Make an appointment to see your caregiver for stitches (suture) or staple removal as directed.  If you have been sent home with a continuous passive motion machine (CPM machine), 0-90 degrees 6 hrs a day   2 hrs a shift SEEK MEDICAL CARE IF: You have swelling of your calf or leg.  You develop shortness of breath or chest pain.  You have redness, swelling, or increasing pain in the wound.  There is pus or any unusual drainage coming from the surgical site.  You notice a bad smell coming from the surgical site or dressing.  The surgical site breaks open after sutures or staples have  been removed.  There is persistent bleeding from the suture or staple line.  You are getting worse or are not improving.  You have any other questions or concerns.  SEEK IMMEDIATE MEDICAL  CARE IF:  You have a fever.  You develop a rash.  You have difficulty breathing.  You develop any reaction or side effects to medicines given.  Your knee motion is decreasing rather than improving.  MAKE SURE YOU:  Understand these instructions.  Will watch your condition.  Will get help right away if you are not doing well or get worse. .    Do not put a pillow under the knee. Place it under the heel.  As directed     Follow the hip precautions as taught in Physical Therapy  As directed     Increase activity slowly as tolerated  As directed     Posterior total hip precautions  As directed     TED hose  As directed     Comments:      Use stockings (TED hose) for 2 weeks on both leg(s).  You may remove them at night for sleeping.    Weight bearing as tolerated  As directed        Follow-up Information   Follow up with Eulas Post, MD On 12/05/2012. (appt time 9:30 am)    Contact information:   [address] [place] [address] [phone]        Signed: Pascal Lux 11/23/2012, 8:46 AM

## 2012-11-23 NOTE — Progress Notes (Signed)
Occupational Therapy Treatment Patient Details Name: Phillip [name] MRN: [id number] DOB: Apr 27, 1951 Today's Date: 11/23/2012 Time: 7829-5621 OT Time Calculation (min): 27 min  OT Assessment / Plan / Recommendation  History of present illness s/p elective L uniknee and L THA posterior approach      OT comments  Pt. With likely d/c home today.  Deferred shower transfer for hhot and will sponge bathe initially.  States wife available to help with lb dressing as needed.    Follow Up Recommendations  Home health OT;Supervision/Assistance - 24 hour                      Frequency Min 2X/week   Progress towards OT Goals Progress towards OT goals: Progressing toward goals  Plan Discharge plan remains appropriate    Precautions / Restrictions Precautions Precautions: Posterior Hip Precaution Booklet Issued: Yes (comment) Precaution Comments: reviewed posterior hip precautions; pt able to recall 3/3 precautions Restrictions Weight Bearing Restrictions: Yes LLE Weight Bearing: Weight bearing as tolerated   Pertinent Vitals/Pain 2/3 resting, 4 with movement.      ADL  Transfers/Ambulation Related to ADLs: pt. declined ADL Comments: fatigued from previous p.t. session.  declined out of chair.  discussed and provided demo of shower stall transfer.  pt. declined stating he does not think he can get le we for 2 weeks.  declined lb dressing tech. stating wife able to assist at d/c.  had questions regarding a leg lifter.  discussed using a sheet as needed .  pt. concerned with toileting due to difficult for positioning of genitals with 3-n-1.  is standing for urinating and feels he will likely not use 3n1 over commode at home se[place] to discomfort and also has a higher toilet at home that he feels he can sit/stand from easier        OT Goals(current goals can now be found in the care plan section) Acute Rehab OT Goals Patient Stated Goal: to go home   Visit Information  Last OT Received  On: 11/23/12 Assistance Needed: +1 History of Present Illness: s/p elective L uniknee and L THA posterior approach                 Cognition  Cognition Arousal/Alertness: Awake/alert Behavior During Therapy: WFL for tasks assessed/performed Overall Cognitive Status: Within Functional Limits for tasks assessed    Mobility  Bed Mobility Bed Mobility: Not assessed Transfers Sit to Stand: 5: Supervision;From chair/3-in-1 Stand to Sit: 5: Supervision;To chair/3-in-1 Details for Transfer Assistance: supervision for safety; requires increased time due to pain; able to recall safety and proper technique    Exercises  Total Joint Exercises Ankle Circles/Pumps: 10 reps;Seated Quad Sets: AROM;Left;10 reps;Standing Heel Slides: AROM;10 reps;Left;Seated Hip ABduction/ADduction: AAROM;Left;10 reps;Seated Long Arc Quad: AAROM;10 reps;Seated;Other (comment) (taught self (A) technique for HEP) Marching in Standing: AROM;5 reps;Standing;Other (comment) (pt limited)   Balance Balance Balance Assessed: No   End of Session OT - End of Session Activity Tolerance: Patient limited by pain Patient left: in chair;with call bell/phone within reach       Robet Leu, COTA/L 11/23/2012, 9:55 AM

## 2012-11-24 NOTE — Care Management Note (Signed)
    Page 1 of 2   11/24/2012     6:08:09 AM   CARE MANAGEMENT NOTE 11/24/2012  Patient:  Phillip [name], Phillip [name]   Account Number:  [id number]  Date Initiated:  11/20/2012  Documentation initiated by:  [facility]  Subjective/Objective Assessment:   admitted postop left total hip arthroplasty     Action/Plan:   plan return home with HHPT   Anticipated DC Date:  11/23/2012   Anticipated DC Plan:  HOME W HOME HEALTH SERVICES      DC Planning Services  CM consult      Choice offered to / List presented to:  C-1 Patient   DME arranged  3-N-1  [facility]      DME agency  [employer]     HH arranged  HH-1 RN  HH-2 PT  HH-3 OT      [facility] agency  [facility]   Status of service:  Completed, signed off Medicare Important Message given?   (If response is "NO", the following Medicare IM given date fields will be blank) Date Medicare IM given:   Date Additional Medicare IM given:    Discharge Disposition:  HOME W HOME HEALTH SERVICES  Per UR Regulation:  Reviewed for med. necessity/level of care/duration of stay  If discussed at Long Length of Stay Meetings, dates discussed:    Comments:  11/21/12 Patient set up with HHRN, HHPT and HHOT with [facility] by MD office. Equipment set up with [employer]. Spoke with patient and confirmed no changes in d/c plan. Patient's wife will be availbale to assist patient after d/c. Will continue to follow for d/c needs. Jacquelynn Cree RN,BSN, CCM

## 2012-12-06 ENCOUNTER — Other Ambulatory Visit: Payer: Self-pay | Admitting: Radiology

## 2012-12-06 NOTE — Telephone Encounter (Signed)
Please advise on refill request for patients Ambien.

## 2012-12-07 MED ORDER — ZOLPIDEM TARTRATE 5 MG PO TABS: 5.0000 mg | ORAL_TABLET | Freq: Every evening | ORAL | Status: DC | PRN

## 2012-12-07 NOTE — Telephone Encounter (Signed)
Forward to Dr. Daub 

## 2012-12-10 ENCOUNTER — Ambulatory Visit: Payer: 59 | Attending: Orthopedic Surgery | Admitting: Physical Therapy

## 2012-12-10 DIAGNOSIS — M25569 Pain in unspecified knee: Secondary | ICD-10-CM | POA: Insufficient documentation

## 2012-12-10 DIAGNOSIS — M7989 Other specified soft tissue disorders: Secondary | ICD-10-CM | POA: Insufficient documentation

## 2012-12-10 DIAGNOSIS — IMO0001 Reserved for inherently not codable concepts without codable children: Secondary | ICD-10-CM | POA: Insufficient documentation

## 2012-12-10 DIAGNOSIS — M25559 Pain in unspecified hip: Secondary | ICD-10-CM | POA: Insufficient documentation

## 2012-12-10 DIAGNOSIS — M25669 Stiffness of unspecified knee, not elsewhere classified: Secondary | ICD-10-CM | POA: Insufficient documentation

## 2012-12-11 ENCOUNTER — Ambulatory Visit: Payer: 59 | Admitting: Physical Therapy

## 2012-12-12 ENCOUNTER — Ambulatory Visit: Payer: 59 | Admitting: Physical Therapy

## 2012-12-14 ENCOUNTER — Ambulatory Visit: Payer: 59 | Admitting: Physical Therapy

## 2012-12-17 ENCOUNTER — Ambulatory Visit: Payer: 59 | Admitting: Physical Therapy

## 2012-12-20 ENCOUNTER — Ambulatory Visit (INDEPENDENT_AMBULATORY_CARE_PROVIDER_SITE_OTHER): Payer: 59 | Admitting: Internal Medicine

## 2012-12-20 ENCOUNTER — Ambulatory Visit: Payer: 59

## 2012-12-20 ENCOUNTER — Other Ambulatory Visit: Payer: Self-pay | Admitting: Emergency Medicine

## 2012-12-20 VITALS — BP 140/80 | HR 79 | Temp 97.8°F | Resp 16 | Ht 72.5 in | Wt 230.0 lb

## 2012-12-20 DIAGNOSIS — R9389 Abnormal findings on diagnostic imaging of other specified body structures: Secondary | ICD-10-CM

## 2012-12-20 DIAGNOSIS — R918 Other nonspecific abnormal finding of lung field: Secondary | ICD-10-CM

## 2012-12-20 NOTE — Patient Instructions (Signed)
[place] Spotted Fever [place] Sp[place] Fever (RMSF) is the oldest known tick-borne disease of people in the [place]. This disease was named because it was first described among people in the [place] area who had an illness characterized by a rash with red-purple-black spots. This disease is caused by a rickettsia (Rickettsia rickettsii), a bacteria carried by the tick. The [place] wood tick and the [place] dog tick, acquire and transmit the RMSF bacteria (pictures NOT actual size). When a larval, nymphal or adult tick feeds on an infected rodent or larger animal, the tick can become infected. Infected adult ticks then feed on people who may then get RMSF. The tick transmits the disease to humans during a prolonged period of feeding that lasts many hours, days or even a couple weeks. The bite is painless and frequently goes unnoticed. An infected male tick may also pass the rickettsial bacteria to her eggs that then may mature to be infected adult ticks. The rickettsia that causes RMSF can also get into a person's body through damaged skin. A tick bite is not necessary. People can get RMSF if they crush a tick and get it's blood or body fluids on their skin through a small cut or sore.  DIAGNOSIS Diagnosis is made by laboratory tests.  TREATMENT Treatment is with antibiotics (medications that kill rickettsia and other bacteria). Immediate treatment usually prevents death. GEOGRAPHIC RANGE This disease was reported only in the [place] until 1931. RMSF has more recently been described among individuals in all states except [place], [place] and [place]. The highest reported incidences of RMSF now occur among residents of [place], [place], [place] and the [place]. TIME OF YEAR  Most cases are diagnosed during late spring and summer when ticks are most active. However, especially in the warmer southern states, a few cases occur during the winter. SYMPTOMS    Symptoms of RMSF begin from 2 to 14 days after a tick bite. The most common early symptoms are fever, muscle aches and headache followed by nausea (feeling sick to your stomach) or vomiting.  The RMSF rash is typically delayed until 3 or more days after symptom onset, and eventually develops in 9 of 10 infected patients by the 5th day of illness. If the disease is not treated it can cause death. If you get a fever, headache, muscle aches, rash, nausea or vomiting within 2 weeks of a possible tick bite or exposure you should see your caregiver immediately. PREVENTION Ticks prefer to hide in shady, moist ground litter. They can often be found above the ground clinging to tall grass, brush, shrubs and low tree branches. They also inhabit lawns and gardens, especially at the edges of woodlands and around old stone walls. Within the areas where ticks generally live, no naturally vegetated area can be considered completely free of infected ticks. The best precaution against RMSF is to avoid contact with soil, leaf litter and vegetation as much as possible in tick infested areas. For those who enjoy gardening or walking in their yards, clear brush and mow tall grass around houses and at the edges of gardens. This may help reduce the tick population in the immediate area. Applications of chemical insecticides by a licensed professional in the spring (late May) and Fall (September) will also control ticks, especially in heavily infested areas. Treatment will never get rid of all the ticks. Getting rid of small animal populations that host ticks will also decrease the tick population. When working in the garden,   pruning shrubs, or handling soil and vegetation, wear light-colored protective clothing and gloves. Spot-check often to prevent ticks from reaching the skin. Ticks cannot jump or fly. They will not drop from an above-ground perch onto a passing animal. Once a tick gains access to human skin it climbs upward  until it reaches a more protected area. For example, the back of the knee, groin, navel, armpit, ears or nape of the neck. It then begins the slow process of embedding itself in the skin. Campers, hikers, field workers, and others who spend time in wooded, brushy or tall grassy areas can avoid exposure to ticks by using the following precautions:  Wear light-colored clothing with a tight weave to spot ticks more easily and prevent contact with the skin.  Wear long pants tucked into socks, long-sleeved shirts tucked into pants and enclosed shoes or boots along with insect repellent.  Spray clothes with insect repellent containing either DEET or Permethrin. Only DEET can be used on exposed skin. Follow the manufacturer's directions carefully.  Wear a hat and keep long hair pulled back.  Stay on cleared, well-worn trails whenever possible.  Spot-check yourself and others often for the presence of ticks on clothes. If you find one, there are likely to be others. Check thoroughly.  Remove clothes after leaving tick-infested areas. If possible, wash them to eliminate any unseen ticks. Check yourself, your children and any pets from head to toe for the presence of ticks.  Shower and shampoo. You can greatly reduce your chances of contracting RMSF if you remove attached ticks as soon as possible. Regular checks of the body, including all body sites covered by hair (head, armpits, genitals), allow removal of the tick before rickettsial transmission. To remove an attached tick, use a forceps or tweezers to detach the intact tick without leaving mouth parts in the skin. The tick bite wound should be cleansed after tick removal. Remember the most common symptoms of RMSF are fever, muscle aches, headache and nausea or vomiting with a later onset of rash. If you get these symptoms after a tick bite and while living in an area where RMSF is found, RMSF should be suspected. If the disease is not treated, it can  cause death. See your caregiver immediately if you get these symptoms. Do this even if not aware of a tick bite. Document Released: 07/31/2000 Document Revised: 07/11/2011 Document Reviewed: 03/23/2009 [facility] Patient Information 2014 [place], [place]. Ehrlichiosis and Anaplasmosis Ehrlichiosis and anaplasmosis are diseases caused by bacteria and carried by ticks. Other names for these infections are:  Human monocytic ehrlichiosis (HME).  Human granulocytotropic anaplasmosis (HGA). HME mostly occurs in the [place] and [place], where the [place] star tick lives. However, infections have occurred in 47 states. HGA infections are limited to fewer geographic locations. Most cases are reported from [place], [place], [place], [place], and [place]. This distribution is almost identical to that of Lyme disease because of the shared species of ixodid ticks (wood ticks, deer ticks). CAUSES   HME is caused by Ehrlichia chaffeensis and other closely related ehrlichia bacteria.  HGA is caused by the bacteria Anaplasma phagocytophilum. An infected adult tick transmits the infection by biting a human. Once a tick gains access to human skin, it generally climbs upward until it reaches a more protected area. This is often the back of the knee, groin, navel, armpit, ears, or nape of the neck. It then begins the slow process of embedding itself in the skin.  Adult ticks are active during warmer times of the year. For this reason, most infections occur between late spring and early fall. SYMPTOMS  Many infected people have no symptoms. For those with symptoms, HME and HGA cause similar illnesses. Symptoms typically begin 1 week or more after a tick bite and may include:  Fever.  Headache.  Chills or shaking.  Fatigue.  Muscle pain.  Nausea.  Loss of appetite.  Vomiting.  Diarrhea. Symptoms commonly last for 1 to 3 weeks if a patient is not diagnosed or not  treated with an antibiotic. Extremely severe disease is rare, but occasional deaths from infection have been reported. DIAGNOSIS  Diagnosis is suggested by a history of tick bites or potential exposure to ticks. Blood tests may show abnormalities of liver function and low counts of white blood cells and platelets. To confirm the diagnosis, the bacteria must be found in a smear of blood on a microscope slide or during testing of the liquid part of your blood (serum). TREATMENT  Treatment with an antibiotic is almost always effective in eliminating symptoms within a couple days and curing the infection.  PREVENTION Ticks prefer to hide in shady, moist ground. However, they can often be found above the ground clinging to tall grass, brush, shrubs, and low tree branches. They also inhabit lawns and gardens, especially at the edges of woodlands and around old stone walls. Within the normal geographic areas where HME and HGA occur, no vegetated area can be considered completely free of infected ticks. In tick-infested areas, the best precaution against infection is to avoid contact with soil, leaf litter, and vegetation as much as possible. Campers, hikers, field workers, and others who spend time in wooded, brushy, or tall grassy areas can avoid exposure to ticks by using the following precautions:  Wear light-colored clothing with a tight weave to spot ticks more easily and prevent contact with the skin.  Wear long pants tucked into socks, long sleeve shirts tucked into pants, and enclosed shoes or boots.  Use insect repellent. Spray clothes with insect repellent containing either DEET or permethrin. Only DEET can be used on exposed skin. Make sure to follow the manufacturer's directions carefully.  Wear a hat and keep long hair pulled back.  Stay on cleared, well-worn trails whenever possible.  Check yourself and others frequently for the presence of ticks on clothes. If you find one tick, there may  be more. Check thoroughly.  Remove clothes after leaving tick-infested areas and, if possible, wash them to eliminate any unseen ticks. Check yourself, your children, and any pets from head to toe for the presence of ticks.  When attached ticks are found, you can greatly reduce your chances of getting HME and HGA if you remove them as soon as possible. Use a tweezer to grab hold of the tick by its mouth parts and pull it off.  Shower and shampoo after possible exposure to ticks. HOME CARE INSTRUCTIONS Take your antibiotics as directed. Finish them even if you start to feel better. SEEK MEDICAL CARE IF:   You have a fever.  You develop a headache.  You develop fatigue.  You develop muscle pain.  You develop nausea, vomiting, or diarrhea. MAKE SURE YOU:  Understand these instructions.  Will watch your condition.  Will get help right away if you are not doing well or get worse. Document Released: 04/15/2000 Document Revised: 07/11/2011 Document Reviewed: 10/22/2010 [facility] Patient Information 2014 [place], [place].

## 2012-12-20 NOTE — Progress Notes (Signed)
  Subjective:    Patient ID: Phillip [name], male    DOB: 06/06/1950, 62 y.o.   MRN: [id number]  HPI Recovering well from hip and knee replacement left. Had abnormal cxr and needs repeat today with nipple marker. No chest sxs.   Review of Systems See list    Objective:   Physical Exam  Vitals reviewed. Constitutional: He is oriented to person, place, and time. He appears well-developed and well-nourished. No distress.  Eyes: EOM are normal. No scleral icterus.  Pulmonary/Chest: Effort normal.  Musculoskeletal: He exhibits edema and tenderness.  Neurological: He is alert and oriented to person, place, and time. No cranial nerve deficit. Gait abnormal. Coordination normal.  Skin: No rash noted.  Psychiatric: He has a normal mood and affect.    [facility] reading (PRIMARY) by  Dr Raelene Bott nodule seen, appears normal.        Assessment & Plan:  Recovering nicely

## 2012-12-21 ENCOUNTER — Ambulatory Visit: Payer: 59 | Admitting: Physical Therapy

## 2012-12-24 ENCOUNTER — Ambulatory Visit: Payer: 59 | Admitting: Physical Therapy

## 2012-12-27 ENCOUNTER — Ambulatory Visit: Payer: 59 | Admitting: Physical Therapy

## 2013-01-01 ENCOUNTER — Ambulatory Visit: Payer: 59 | Attending: Orthopedic Surgery | Admitting: Physical Therapy

## 2013-01-01 DIAGNOSIS — IMO0001 Reserved for inherently not codable concepts without codable children: Secondary | ICD-10-CM | POA: Insufficient documentation

## 2013-01-01 DIAGNOSIS — M25569 Pain in unspecified knee: Secondary | ICD-10-CM | POA: Insufficient documentation

## 2013-01-01 DIAGNOSIS — M25559 Pain in unspecified hip: Secondary | ICD-10-CM | POA: Insufficient documentation

## 2013-01-01 DIAGNOSIS — M7989 Other specified soft tissue disorders: Secondary | ICD-10-CM | POA: Insufficient documentation

## 2013-01-01 DIAGNOSIS — M25669 Stiffness of unspecified knee, not elsewhere classified: Secondary | ICD-10-CM | POA: Insufficient documentation

## 2013-01-04 ENCOUNTER — Ambulatory Visit: Payer: 59 | Admitting: Physical Therapy

## 2013-01-07 ENCOUNTER — Ambulatory Visit: Payer: 59 | Admitting: Physical Therapy

## 2013-01-10 ENCOUNTER — Ambulatory Visit: Payer: 59 | Admitting: Physical Therapy

## 2013-01-14 ENCOUNTER — Ambulatory Visit: Payer: 59 | Admitting: Physical Therapy

## 2013-01-21 ENCOUNTER — Ambulatory Visit: Payer: 59 | Admitting: Physical Therapy

## 2013-01-24 ENCOUNTER — Other Ambulatory Visit: Payer: Self-pay | Admitting: Emergency Medicine

## 2013-01-30 ENCOUNTER — Ambulatory Visit: Payer: 59 | Attending: Orthopedic Surgery | Admitting: Physical Therapy

## 2013-01-30 DIAGNOSIS — IMO0001 Reserved for inherently not codable concepts without codable children: Secondary | ICD-10-CM | POA: Insufficient documentation

## 2013-01-30 DIAGNOSIS — M7989 Other specified soft tissue disorders: Secondary | ICD-10-CM | POA: Insufficient documentation

## 2013-01-30 DIAGNOSIS — M25559 Pain in unspecified hip: Secondary | ICD-10-CM | POA: Insufficient documentation

## 2013-01-30 DIAGNOSIS — M25569 Pain in unspecified knee: Secondary | ICD-10-CM | POA: Insufficient documentation

## 2013-01-30 DIAGNOSIS — M25669 Stiffness of unspecified knee, not elsewhere classified: Secondary | ICD-10-CM | POA: Insufficient documentation

## 2013-01-31 ENCOUNTER — Ambulatory Visit: Payer: 59 | Admitting: Physical Therapy

## 2013-02-01 ENCOUNTER — Other Ambulatory Visit: Payer: Self-pay | Admitting: Family Medicine

## 2013-02-01 DIAGNOSIS — D62 Acute posthemorrhagic anemia: Secondary | ICD-10-CM

## 2013-02-01 DIAGNOSIS — E119 Type 2 diabetes mellitus without complications: Secondary | ICD-10-CM

## 2013-02-01 DIAGNOSIS — E785 Hyperlipidemia, unspecified: Secondary | ICD-10-CM

## 2013-02-02 ENCOUNTER — Other Ambulatory Visit (INDEPENDENT_AMBULATORY_CARE_PROVIDER_SITE_OTHER): Payer: 59 | Admitting: Family Medicine

## 2013-02-02 DIAGNOSIS — E785 Hyperlipidemia, unspecified: Secondary | ICD-10-CM

## 2013-02-02 DIAGNOSIS — E119 Type 2 diabetes mellitus without complications: Secondary | ICD-10-CM

## 2013-02-02 DIAGNOSIS — D62 Acute posthemorrhagic anemia: Secondary | ICD-10-CM

## 2013-02-02 LAB — POCT CBC
Granulocyte percent: 64.1 %G (ref 37–80)
MCH, POC: 29.8 pg (ref 27–31.2)
MID (cbc): 0.5 (ref 0–0.9)
MPV: 8.2 fL (ref 0–99.8)
POC LYMPH PERCENT: 27.5 %L (ref 10–50)
POC MID %: 8.4 %M (ref 0–12)
Platelet Count, POC: 373 10*3/uL (ref 142–424)
RBC: 4.33 M/uL — AB (ref 4.69–6.13)
RDW, POC: 13.6 %

## 2013-02-02 LAB — COMPREHENSIVE METABOLIC PANEL
Albumin: 4.2 g/dL (ref 3.5–5.2)
BUN: 21 mg/dL (ref 6–23)
CO2: 28 mEq/L (ref 19–32)
Calcium: 10.2 mg/dL (ref 8.4–10.5)
Chloride: 101 mEq/L (ref 96–112)
Glucose, Bld: 134 mg/dL — ABNORMAL HIGH (ref 70–99)
Potassium: 4.1 mEq/L (ref 3.5–5.3)
Total Protein: 6.8 g/dL (ref 6.0–8.3)

## 2013-02-02 LAB — LIPID PANEL
HDL: 39 mg/dL — ABNORMAL LOW (ref >39)
Triglycerides: 133 mg/dL (ref <150)

## 2013-02-03 ENCOUNTER — Encounter: Payer: Self-pay | Admitting: Family Medicine

## 2013-02-05 ENCOUNTER — Ambulatory Visit: Payer: 59 | Admitting: Physical Therapy

## 2013-02-22 ENCOUNTER — Other Ambulatory Visit: Payer: Self-pay

## 2013-02-22 DIAGNOSIS — E781 Pure hyperglyceridemia: Secondary | ICD-10-CM

## 2013-02-22 MED ORDER — CHOLINE FENOFIBRATE 135 MG PO CPDR: 135.0000 mg | DELAYED_RELEASE_CAPSULE | Freq: Every day | ORAL | Status: DC

## 2013-03-01 ENCOUNTER — Ambulatory Visit: Payer: 59 | Admitting: Physical Therapy

## 2013-03-07 ENCOUNTER — Other Ambulatory Visit: Payer: Self-pay

## 2013-03-08 ENCOUNTER — Ambulatory Visit: Payer: 59 | Attending: Orthopedic Surgery | Admitting: Physical Therapy

## 2013-03-08 DIAGNOSIS — M25559 Pain in unspecified hip: Secondary | ICD-10-CM | POA: Insufficient documentation

## 2013-03-08 DIAGNOSIS — M7989 Other specified soft tissue disorders: Secondary | ICD-10-CM | POA: Insufficient documentation

## 2013-03-08 DIAGNOSIS — IMO0001 Reserved for inherently not codable concepts without codable children: Secondary | ICD-10-CM | POA: Insufficient documentation

## 2013-03-08 DIAGNOSIS — M25669 Stiffness of unspecified knee, not elsewhere classified: Secondary | ICD-10-CM | POA: Insufficient documentation

## 2013-03-08 DIAGNOSIS — M25569 Pain in unspecified knee: Secondary | ICD-10-CM | POA: Insufficient documentation

## 2013-04-10 ENCOUNTER — Encounter: Payer: Self-pay | Admitting: Emergency Medicine

## 2013-04-10 ENCOUNTER — Ambulatory Visit (INDEPENDENT_AMBULATORY_CARE_PROVIDER_SITE_OTHER): Payer: 59 | Admitting: Emergency Medicine

## 2013-04-10 VITALS — BP 147/98 | HR 67 | Temp 97.5°F | Resp 18

## 2013-04-10 DIAGNOSIS — J029 Acute pharyngitis, unspecified: Secondary | ICD-10-CM

## 2013-04-10 NOTE — Progress Notes (Signed)
   Subjective:    Patient ID: Phillip [name], male    DOB: Jun 14, 1950, 62 y.o.   MRN: [id number]  HPI there is mild redness the left side of the throat. He has postnasal drip. He was concerned about possible strep because of his strep exposure this week. Patient also states he was having increasing difficulty with numbness in his hands and feet so he decreased his Lopressor to 50 mg twice a day    Review of Systems     Objective:   Physical Exam there is redness of the posterior pharynx. TMs are clear. Nose is normal. Chest is clear. Cardiac reveals occasional skipped beat  Results for orders placed in visit on 04/10/13  POCT RAPID STREP A (OFFICE)      Result Value Range   Rapid Strep A Screen Negative  Negative        Assessment & Plan:  Patient states he will contact Dr. Arlyn Leak about his change in medications. Throat culture was sent since his strep screen was negative

## 2013-04-12 LAB — CULTURE, GROUP A STREP: Organism ID, Bacteria: NORMAL

## 2013-04-17 ENCOUNTER — Inpatient Hospital Stay (HOSPITAL_COMMUNITY): Payer: 59

## 2013-04-17 ENCOUNTER — Telehealth: Payer: Self-pay | Admitting: Emergency Medicine

## 2013-04-17 ENCOUNTER — Encounter (HOSPITAL_COMMUNITY): Payer: Self-pay | Admitting: Emergency Medicine

## 2013-04-17 ENCOUNTER — Ambulatory Visit (HOSPITAL_COMMUNITY)
Admission: RE | Admit: 2013-04-17 | Discharge: 2013-04-17 | Disposition: A | Discharge status: Home / Self Care | Payer: 59 | Source: Ambulatory Visit | Attending: Emergency Medicine | Admitting: Emergency Medicine

## 2013-04-17 ENCOUNTER — Other Ambulatory Visit: Payer: Self-pay | Admitting: Emergency Medicine

## 2013-04-17 ENCOUNTER — Inpatient Hospital Stay (HOSPITAL_COMMUNITY)
Admission: EM | Admit: 2013-04-17 | Discharge: 2013-04-19 | DRG: 041 | Disposition: A | Discharge status: Home / Self Care | Payer: 59 | Attending: Family Medicine | Admitting: Family Medicine

## 2013-04-17 DIAGNOSIS — I493 Ventricular premature depolarization: Secondary | ICD-10-CM

## 2013-04-17 DIAGNOSIS — Z7982 Long term (current) use of aspirin: Secondary | ICD-10-CM

## 2013-04-17 DIAGNOSIS — Z79899 Other long term (current) drug therapy: Secondary | ICD-10-CM

## 2013-04-17 DIAGNOSIS — I472 Ventricular tachycardia, unspecified: Secondary | ICD-10-CM | POA: Diagnosis present

## 2013-04-17 DIAGNOSIS — M109 Gout, unspecified: Secondary | ICD-10-CM | POA: Diagnosis present

## 2013-04-17 DIAGNOSIS — E119 Type 2 diabetes mellitus without complications: Secondary | ICD-10-CM

## 2013-04-17 DIAGNOSIS — H5347 Heteronymous bilateral field defects: Secondary | ICD-10-CM

## 2013-04-17 DIAGNOSIS — Z96649 Presence of unspecified artificial hip joint: Secondary | ICD-10-CM

## 2013-04-17 DIAGNOSIS — I10 Essential (primary) hypertension: Secondary | ICD-10-CM | POA: Diagnosis present

## 2013-04-17 DIAGNOSIS — E781 Pure hyperglyceridemia: Secondary | ICD-10-CM | POA: Diagnosis present

## 2013-04-17 DIAGNOSIS — G4733 Obstructive sleep apnea (adult) (pediatric): Secondary | ICD-10-CM | POA: Diagnosis present

## 2013-04-17 DIAGNOSIS — I4949 Other premature depolarization: Secondary | ICD-10-CM | POA: Diagnosis present

## 2013-04-17 DIAGNOSIS — I2510 Atherosclerotic heart disease of native coronary artery without angina pectoris: Secondary | ICD-10-CM | POA: Diagnosis present

## 2013-04-17 DIAGNOSIS — I4729 Other ventricular tachycardia: Secondary | ICD-10-CM | POA: Diagnosis present

## 2013-04-17 DIAGNOSIS — Z7902 Long term (current) use of antithrombotics/antiplatelets: Secondary | ICD-10-CM

## 2013-04-17 DIAGNOSIS — Z8673 Personal history of transient ischemic attack (TIA), and cerebral infarction without residual deficits: Secondary | ICD-10-CM | POA: Diagnosis present

## 2013-04-17 DIAGNOSIS — E785 Hyperlipidemia, unspecified: Secondary | ICD-10-CM | POA: Diagnosis present

## 2013-04-17 DIAGNOSIS — G43109 Migraine with aura, not intractable, without status migrainosus: Secondary | ICD-10-CM

## 2013-04-17 DIAGNOSIS — I6340 Cerebral infarction due to embolism of unspecified cerebral artery: Principal | ICD-10-CM | POA: Diagnosis present

## 2013-04-17 DIAGNOSIS — I639 Cerebral infarction, unspecified: Secondary | ICD-10-CM | POA: Diagnosis present

## 2013-04-17 HISTORY — DX: Ventricular premature depolarization: I49.3

## 2013-04-17 HISTORY — DX: Migraine with aura, not intractable, without status migrainosus: G43.109

## 2013-04-17 LAB — COMPREHENSIVE METABOLIC PANEL
ALT: 17 U/L (ref 0–53)
Albumin: 4.4 g/dL (ref 3.5–5.2)
Alkaline Phosphatase: 44 U/L (ref 39–117)
CO2: 25 mEq/L (ref 19–32)
Calcium: 10.1 mg/dL (ref 8.4–10.5)
Chloride: 101 mEq/L (ref 96–112)
GFR calc Af Amer: 78 mL/min — ABNORMAL LOW (ref >90)
GFR calc non Af Amer: 68 mL/min — ABNORMAL LOW (ref >90)
Glucose, Bld: 124 mg/dL — ABNORMAL HIGH (ref 70–99)
Potassium: 4 mEq/L (ref 3.5–5.1)
Sodium: 136 mEq/L (ref 135–145)
Total Bilirubin: 0.2 mg/dL — ABNORMAL LOW (ref 0.3–1.2)
Total Protein: 7.1 g/dL (ref 6.0–8.3)

## 2013-04-17 LAB — RAPID URINE DRUG SCREEN, HOSP PERFORMED
Barbiturates: NOT DETECTED
Benzodiazepines: NOT DETECTED
Cocaine: NOT DETECTED
Tetrahydrocannabinol: NOT DETECTED

## 2013-04-17 LAB — DIFFERENTIAL
Basophils Absolute: 0 10*3/uL (ref 0.0–0.1)
Eosinophils Absolute: 0.1 10*3/uL (ref 0.0–0.7)
Lymphocytes Relative: 24 % (ref 12–46)
Lymphs Abs: 1.4 10*3/uL (ref 0.7–4.0)
Neutrophils Relative %: 67 % (ref 43–77)

## 2013-04-17 LAB — CREATININE, SERUM
GFR calc Af Amer: 78 mL/min — ABNORMAL LOW (ref >90)
GFR calc non Af Amer: 67 mL/min — ABNORMAL LOW (ref >90)

## 2013-04-17 LAB — POCT I-STAT, CHEM 8
BUN: 25 mg/dL — ABNORMAL HIGH (ref 6–23)
Calcium, Ion: 1.26 mmol/L (ref 1.13–1.30)
Chloride: 102 mEq/L (ref 96–112)
Creatinine, Ser: 1.2 mg/dL (ref 0.50–1.35)
Glucose, Bld: 121 mg/dL — ABNORMAL HIGH (ref 70–99)
Hemoglobin: 14.6 g/dL (ref 13.0–17.0)
Potassium: 4 mEq/L (ref 3.5–5.1)
TCO2: 25 mmol/L (ref 0–100)

## 2013-04-17 LAB — URINALYSIS, ROUTINE W REFLEX MICROSCOPIC
Ketones, ur: NEGATIVE mg/dL
Leukocytes, UA: NEGATIVE
Nitrite: NEGATIVE
Specific Gravity, Urine: 1.014 (ref 1.005–1.030)
pH: 6.5 (ref 5.0–8.0)

## 2013-04-17 LAB — GLUCOSE, CAPILLARY: Glucose-Capillary: 126 mg/dL — ABNORMAL HIGH (ref 70–99)

## 2013-04-17 LAB — CBC
HCT: 40.3 % (ref 39.0–52.0)
Hemoglobin: 13.2 g/dL (ref 13.0–17.0)
Hemoglobin: 13.6 g/dL (ref 13.0–17.0)
MCH: 30 pg (ref 26.0–34.0)
MCV: 89 fL (ref 78.0–100.0)
Platelets: 325 10*3/uL (ref 150–400)
Platelets: 334 10*3/uL (ref 150–400)
RBC: 4.47 MIL/uL (ref 4.22–5.81)
RBC: 4.53 MIL/uL (ref 4.22–5.81)
RDW: 12.5 % (ref 11.5–15.5)
RDW: 12.6 % (ref 11.5–15.5)
WBC: 5.6 10*3/uL (ref 4.0–10.5)
WBC: 6.8 10*3/uL (ref 4.0–10.5)

## 2013-04-17 LAB — PROTIME-INR
INR: 0.92 (ref 0.00–1.49)
Prothrombin Time: 12.2 seconds (ref 11.6–15.2)

## 2013-04-17 MED ORDER — ZOLPIDEM TARTRATE 5 MG PO TABS
5.0000 mg | ORAL_TABLET | Freq: Every evening | ORAL | Status: DC | PRN
  Administered 2013-04-17 – 2013-04-18 (×2): 5 mg via ORAL
  Filled 2013-04-17 (×2): qty 1

## 2013-04-17 MED ORDER — ACETAMINOPHEN 325 MG PO TABS
650.0000 mg | ORAL_TABLET | Freq: Four times a day (QID) | ORAL | Status: DC | PRN
  Administered 2013-04-17: 650 mg via ORAL
  Filled 2013-04-17: qty 2

## 2013-04-17 MED ORDER — ALLOPURINOL 300 MG PO TABS
300.0000 mg | ORAL_TABLET | Freq: Every day | ORAL | Status: DC
  Administered 2013-04-17 – 2013-04-19 (×3): 300 mg via ORAL
  Filled 2013-04-17 (×3): qty 1

## 2013-04-17 MED ORDER — INSULIN ASPART 100 UNIT/ML ~~LOC~~ SOLN
0.0000 [IU] | Freq: Three times a day (TID) | SUBCUTANEOUS | Status: DC
  Administered 2013-04-18 (×2): 3 [IU] via SUBCUTANEOUS
  Administered 2013-04-18: 2 [IU] via SUBCUTANEOUS
  Administered 2013-04-19: 3 [IU] via SUBCUTANEOUS

## 2013-04-17 MED ORDER — SODIUM CHLORIDE 0.9 % IJ SOLN: 3.0000 mL | INTRAMUSCULAR | Status: DC | PRN

## 2013-04-17 MED ORDER — SENNOSIDES-DOCUSATE SODIUM 8.6-50 MG PO TABS: 1.0000 | ORAL_TABLET | Freq: Every evening | ORAL | Status: DC | PRN

## 2013-04-17 MED ORDER — ASPIRIN EC 81 MG PO TBEC
81.0000 mg | DELAYED_RELEASE_TABLET | Freq: Every day | ORAL | Status: DC
  Administered 2013-04-17 – 2013-04-19 (×3): 81 mg via ORAL
  Filled 2013-04-17 (×4): qty 1

## 2013-04-17 MED ORDER — ENOXAPARIN SODIUM 40 MG/0.4ML ~~LOC~~ SOLN
40.0000 mg | SUBCUTANEOUS | Status: DC
  Administered 2013-04-17 – 2013-04-18 (×2): 40 mg via SUBCUTANEOUS
  Filled 2013-04-17 (×3): qty 0.4

## 2013-04-17 MED ORDER — SODIUM CHLORIDE 0.9 % IV SOLN: 250.0000 mL | INTRAVENOUS | Status: DC | PRN

## 2013-04-17 MED ORDER — CLOPIDOGREL BISULFATE 75 MG PO TABS
75.0000 mg | ORAL_TABLET | Freq: Every day | ORAL | Status: DC
  Administered 2013-04-18 – 2013-04-19 (×2): 75 mg via ORAL
  Filled 2013-04-17 (×3): qty 1

## 2013-04-17 MED ORDER — SODIUM CHLORIDE 0.9 % IJ SOLN
3.0000 mL | Freq: Two times a day (BID) | INTRAMUSCULAR | Status: DC
  Administered 2013-04-17 – 2013-04-19 (×4): 3 mL via INTRAVENOUS

## 2013-04-17 NOTE — ED Provider Notes (Signed)
CSN: [id number]     Arrival date & time 04/17/13  1144 History   First MD Initiated Contact with Patient 04/17/13 1148     Chief Complaint  Patient presents with  . Cerebrovascular Accident   HPI Pt was seen at 1155. Per pt, c/o gradual onset and persistence of constant visual changes that began yesterday approx 1515. Pt states he was seeing "squiggly lines" and right sided visual deficit and thought he was starting to get one of his usual migraine headaches. Pt took his triptan without relief. Pt states approx 4 to 5 hours later the "squiggly lines" resolved but he continued to have right sided visual field cut. Pt was seen by his PMD and Eye MD this morning, and was then sent to [facility] for an outpatient MRI. Pt was then told to come to the ED for further evaluation and admission for acute stroke seen on MRI. Pt denies any new symptoms. Pt feels his visual deficit is still present, but improved since onset yesterday. Denies focal motor weakness, no tingling/numbness in extremities, no facial droop, no slurred speech, no dysphagia, no CP/palpitations, no SOB/cough, no abd pain, no N/V/D, no fevers.    Past Medical History  Diagnosis Date  . Anemia   . Arthritis   . Diabetes mellitus   . Hyperlipidemia   . Hypertension   . Sleep apnea     cpap  . Gout   . Osteoarthritis of left hip 11/20/2012  . Localized osteoarthritis of left knee 11/20/2012  . Neuromuscular disorder     " mild periferal neauropathy  . PVC (premature ventricular contraction)   . Migraine headache with aura    Past Surgical History  Procedure Laterality Date  . Cholecystectomy  1997  . Knee arthoscopy  2006    Left Knee  . Tonsillectomy and adenoidectomy      Childhood  . Total hip arthroplasty Left 11/20/2012    Dr Dion Saucier  . Replacement unicondylar joint knee Left 11/20/2012    Dr Dion Saucier  . Partial knee arthroplasty Left 11/20/2012    Procedure: UNICOMPARTMENTAL KNEE;  Surgeon: Eulas Post, MD;  Location: [facility]  OR;  Service: Orthopedics;  Laterality: Left;  . Total hip arthroplasty Left 11/20/2012    Procedure: TOTAL HIP ARTHROPLASTY;  Surgeon: Eulas Post, MD;  Location: [facility] OR;  Service: Orthopedics;  Laterality: Left;   Family History  Problem Relation Age of Onset  . Cancer Mother     Breast   History  Substance Use Topics  . Smoking status: Never Smoker   . Smokeless tobacco: Never Used  . Alcohol Use: No    Review of Systems ROS: Statement: All systems negative except as marked or noted in the HPI; Constitutional: Negative for fever and chills. ; ; Eyes: +visual changes. Negative for eye pain, redness and discharge. ; ; ENMT: Negative for ear pain, hoarseness, nasal congestion, sinus pressure and sore throat. ; ; Cardiovascular: Negative for chest pain, palpitations, diaphoresis, dyspnea and peripheral edema. ; ; Respiratory: Negative for cough, wheezing and stridor. ; ; Gastrointestinal: Negative for nausea, vomiting, diarrhea, abdominal pain, blood in stool, hematemesis, jaundice and rectal bleeding. . ; ; Genitourinary: Negative for dysuria, flank pain and hematuria. ; ; Musculoskeletal: Negative for back pain and neck pain. Negative for swelling and trauma.; ; Skin: Negative for pruritus, rash, abrasions, blisters, bruising and skin lesion.; ; Neuro: Negative for lightheadedness and neck stiffness. Negative for weakness, altered level of consciousness , altered mental status, extremity  weakness, paresthesias, involuntary movement, seizure and syncope.     Allergies  Sulfa antibiotics  Home Medications   Current Outpatient Rx  Name  Route  Sig  Dispense  Refill  . acetaminophen (TYLENOL) 650 MG CR tablet   Oral   Take 1,300 mg by mouth every 8 (eight) hours as needed for pain.         [name] allopurinol (ZYLOPRIM) 300 MG tablet   Oral   Take 1 tablet (300 mg total) by mouth daily.   90 tablet   3   . aspirin 81 MG tablet   Oral   Take 81 mg by mouth daily.          [name]  losartan-hydrochlorothiazide (HYZAAR) 100-25 MG per tablet   Oral   Take 1 tablet by mouth daily.   90 tablet   3   . metaxalone (SKELAXIN) 800 MG tablet   Oral   Take 1 tablet (800 mg total) by mouth every 6 (six) hours as needed.   60 tablet   1   . metFORMIN (GLUMETZA) 1000 MG (MOD) 24 hr tablet   Oral   Take 1,000 mg by mouth 2 (two) times daily with a meal.         . metoprolol succinate (TOPROL-XL) 50 MG 24 hr tablet   Oral   Take 50 mg by mouth daily. Take with or immediately following a meal.         . naproxen sodium (ANAPROX) 220 MG tablet   Oral   Take 440 mg by mouth daily as needed (pain).         . saxagliptin HCl (ONGLYZA) 5 MG TABS tablet   Oral   Take 1 tablet (5 mg total) by mouth daily.   90 tablet   3   . SM LANCETS 33G MISC      USE TO TEST BLOOD SUGAR TWICE DAILY   400 each   PRN     Dx code 250.00   . TRUETEST TEST test strip      USE TO TEST TWICE DAILY   400 each   PRN     Dispense as written.    Dx code 250.00   . zolpidem (AMBIEN) 5 MG tablet   Oral   Take 5 mg by mouth at bedtime as needed for sleep.          BP 158/89  Pulse 69  Temp(Src) 97.9 F (36.6 C) (Oral)  Resp 23  SpO2 99% Physical Exam 1200: Physical examination:  Nursing notes reviewed; Vital signs and O2 SAT reviewed;  Constitutional: Well developed, Well nourished, Well hydrated, In no acute distress; Head:  Normocephalic, atraumatic; Eyes: EOMI, PERRL, No scleral icterus; ENMT: TM's clear bilat. +edemetous nasal turbinates bilat with clear rhinorrhea. Mouth and pharynx normal, Mucous membranes moist; Neck: Supple, Full range of motion, No lymphadenopathy; Cardiovascular: Regular rate and rhythm, No murmur, rub, or gallop; Respiratory: Breath sounds clear & equal bilaterally, No rales, rhonchi, wheezes.  Speaking full sentences with ease, Normal respiratory effort/excursion; Chest: Nontender, Movement normal; Abdomen: Soft, Nontender, Nondistended, Normal  bowel sounds; Genitourinary: No CVA tenderness; Extremities: Pulses normal, No tenderness, No edema, No calf edema or asymmetry.; Neuro: AA&Ox3, Major CN grossly intact.Speech clear.  No facial droop.  No nystagmus. Grips equal. Strength 5/5 equal bilat UE's and LE's.  DTR 1/4 equal bilat UE's and LE's.  No gross sensory deficits.  Normal cerebellar testing bilat UE's (finger-nose) and LE's (heel-shin).;; Skin: Color  normal, Warm, Dry.   ED Course  Procedures   EKG Interpretation   None       MDM  MDM Reviewed: previous chart, vitals and nursing note Reviewed previous: labs and ECG Interpretation: labs, ECG and MRI    Date: 04/17/2013  Rate: 63  Rhythm: normal sinus rhythm  QRS Axis: left  Intervals: normal  ST/T Wave abnormalities: normal  Conduction Disutrbances:nonspecific intraventricular conduction delay  Narrative Interpretation:   Old EKG Reviewed: unchanged; no significant changes from previous EKG dated 09/05/2011.   Results for orders placed during the hospital encounter of 04/17/13  ETHANOL      Result Value Range   Alcohol, Ethyl (B) <11  0 - 11 mg/dL  PROTIME-INR      Result Value Range   Prothrombin Time 12.2  11.6 - 15.2 seconds   INR 0.92  0.00 - 1.49  APTT      Result Value Range   aPTT 26  24 - 37 seconds  CBC      Result Value Range   WBC 5.6  4.0 - 10.5 K/uL   RBC 4.47  4.22 - 5.81 MIL/uL   Hemoglobin 13.2  13.0 - 17.0 g/dL   HCT 16.1  09.6 - 04.5 %   MCV 88.6  78.0 - 100.0 fL   MCH 29.5  26.0 - 34.0 pg   MCHC 33.3  30.0 - 36.0 g/dL   RDW 40.9  81.1 - 91.4 %   Platelets 325  150 - 400 K/uL  DIFFERENTIAL      Result Value Range   Neutrophils Relative % 67  43 - 77 %   Neutro Abs 3.7  1.7 - 7.7 K/uL   Lymphocytes Relative 24  12 - 46 %   Lymphs Abs 1.4  0.7 - 4.0 K/uL   Monocytes Relative 8  3 - 12 %   Monocytes Absolute 0.4  0.1 - 1.0 K/uL   Eosinophils Relative 1  0 - 5 %   Eosinophils Absolute 0.1  0.0 - 0.7 K/uL   Basophils Relative 0   0 - 1 %   Basophils Absolute 0.0  0.0 - 0.1 K/uL  COMPREHENSIVE METABOLIC PANEL      Result Value Range   Sodium 136  135 - 145 mEq/L   Potassium 4.0  3.5 - 5.1 mEq/L   Chloride 101  96 - 112 mEq/L   CO2 25  19 - 32 mEq/L   Glucose, Bld 124 (*) 70 - 99 mg/dL   BUN 25 (*) 6 - 23 mg/dL   Creatinine, Ser 7.82  0.50 - 1.35 mg/dL   Calcium 95.6  8.4 - 21.3 mg/dL   Total Protein 7.1  6.0 - 8.3 g/dL   Albumin 4.4  3.5 - 5.2 g/dL   AST 14  0 - 37 U/L   ALT 17  0 - 53 U/L   Alkaline Phosphatase 44  39 - 117 U/L   Total Bilirubin 0.2 (*) 0.3 - 1.2 mg/dL   GFR calc non Af Amer 68 (*) >90 mL/min   GFR calc Af Amer 78 (*) >90 mL/min  TROPONIN I      Result Value Range   Troponin I <0.30  <0.30 ng/mL  URINE RAPID DRUG SCREEN (HOSP PERFORMED)      Result Value Range   Opiates NONE DETECTED  NONE DETECTED   Cocaine NONE DETECTED  NONE DETECTED   Benzodiazepines NONE DETECTED  NONE DETECTED   Amphetamines NONE DETECTED  NONE DETECTED   Tetrahydrocannabinol NONE DETECTED  NONE DETECTED   Barbiturates NONE DETECTED  NONE DETECTED  URINALYSIS, ROUTINE W REFLEX MICROSCOPIC      Result Value Range   Color, Urine YELLOW  YELLOW   APPearance CLEAR  CLEAR   Specific Gravity, Urine 1.014  1.005 - 1.030   pH 6.5  5.0 - 8.0   Glucose, UA NEGATIVE  NEGATIVE mg/dL   Hgb urine dipstick NEGATIVE  NEGATIVE   Bilirubin Urine NEGATIVE  NEGATIVE   Ketones, ur NEGATIVE  NEGATIVE mg/dL   Protein, ur NEGATIVE  NEGATIVE mg/dL   Urobilinogen, UA 1.0  0.0 - 1.0 mg/dL   Nitrite NEGATIVE  NEGATIVE   Leukocytes, UA NEGATIVE  NEGATIVE  POCT I-STAT TROPONIN I      Result Value Range   Troponin i, poc 0.00  0.00 - 0.08 ng/mL   Comment 3           POCT I-STAT, CHEM 8      Result Value Range   Sodium 140  135 - 145 mEq/L   Potassium 4.0  3.5 - 5.1 mEq/L   Chloride 102  96 - 112 mEq/L   BUN 25 (*) 6 - 23 mg/dL   Creatinine, Ser 1.61  0.50 - 1.35 mg/dL   Glucose, Bld 096 (*) 70 - 99 mg/dL   Calcium, Ion 0.45   4.09 - 1.30 mmol/L   TCO2 25  0 - 100 mmol/L   Hemoglobin 14.6  13.0 - 17.0 g/dL   HCT 81.1  91.4 - 78.2 %   Mr Brain Wo Contrast 04/17/2013   CLINICAL DATA:  Headache with visual disturbance.  EXAM: MRI HEAD WITHOUT CONTRAST  TECHNIQUE: Multiplanar, multiecho pulse sequences of the brain and surrounding structures were obtained without intravenous contrast.  COMPARISON:  None.  FINDINGS: Diffusion imaging shows a 1.5 cm acute infarction in the left occipital lobe with an adjacent 3 mm focus of infarction. No evidence of swelling or hemorrhage. No other acute insult.  The brainstem is normal. No cerebellar abnormality. Cerebral hemispheres are otherwise normal except for a very few punctate white matter foci in the frontal white matter, not likely significant. There is not a pattern of widespread small vessel disease. No mass lesion, hydrocephalus or extra-axial collection. Flow appears to be present in the major vessels at the base of the brain. No pituitary mass. No inflammatory sinus disease. No skull or skullbase lesion.  IMPRESSION: 1.5 cm acute infarction in the left occipital cortical and subcortical brain with an adjacent 3 mm infarction. Findings are consistent with embolic infarction. No hemorrhage or mass effect.  These results were called by telephone at the time of interpretation on 04/17/2013 at 11:35 AM to Dr. Lesle Chris , who verbally acknowledged these results. I also discussed the case directly with the patient.   Electronically Signed   By: Paulina Fusi M.D.   On: 04/17/2013 11:36    1230:  Pt not TPA or code stroke due to delayed presentation (symptom onset yesterday). T/C to Neuro Dr. Thad Ranger, case discussed, including:  HPI, pertinent PM/SHx, VS/PE, dx testing, ED course and treatment:  Agreeable to consult, requests to transfer pt to [facility] to medicine service.  1425:  Neuro exam without change. Dx and testing d/w pt and family.  Questions answered.  Verb understanding, agreeable to  transfer/admit.  T/C to [facility] Resident, case discussed, including:  HPI, pertinent PM/SHx, VS/PE, dx testing, ED course and treatment:  Agreeable to accept transfer/admit,  requests to write temporary orders, obtain tele bed to Dr. Marinell Blight service.       Laray Anger, DO 04/18/13 [phone]

## 2013-04-17 NOTE — H&P (Signed)
Family Medicine Teaching [facility] Admission History and Physical Service Pager: [phone]  Patient name: Phillip [name] Medical record number: [id number] Date of birth: [birth year] Age: 62 y.o. Gender: male  Primary Care Provider: Lucilla Edin, MD Consultants: Neurology Stroke Service Code Status: Full  Chief Complaint: Vision Problems  Assessment and Plan: Phillip [name] is a 62 y.o. male presenting with acute occipital infarct. PMH is significant for diabetes (well controlled), hypertension and gout.   # Stroke, Acute - Presenting symptom was visual field deficit. Located in left occipital cortex and subcortical area, 1.5 cm; presented too late for Code Stroke;  - Start plavix at 75 mg daily with daily aspirin for 30 days, followed by daily plavix - Encourage starting statin, pt previously intolerant due to myalgias  - Standard work up with MRA, carotid doppler, echo, lipid panel, TSH, A1C - Allow permissive hypertension to 220/110 in acute stroke setting - Appreciate the recommendations from the neuro stroke team  # HTN - Hold home arb, HCTZ, and beta blocker  # Diabetes - Last A1C 6.6 - Check A1C in AM - Hold metformin and onlgyza in inpatient setting, substitute moderate sliding scale novolog  # Obstructive Seep Apnea - Cont CPAP  # Gout - Cont home allopurinol  # Osteoarthritis - S/p left hip replacement and partial left knee - Avoid NSAIDs given pro-thrombotic characters     FEN/GI: diabetic diet, SLIV Prophylaxis: Lovenox  Disposition: Admit to inpatient  History of Present Illness: Phillip [name] is a 62 y.o. male presenting with MRI findings consistent with embolic stroke of left occipital lobe. This is in the setting of a visual field deficit that started yesterday afternoon. It was similar to previous migraines with aura. Therefore, the patient took a triptan, ibuprofen, and tylenol. Symptoms were persistent overnight. Therefore, he presented to this  ophthalmologist, Dr. Dione Booze, who did not feel that this was an ophthalmologic problem, but rather a possible stroke. The patient then had an MRI performed at [facility] that demonstrated the above findings. Currently, the patient denies any visual field deficits. He denies weakness, dysphagia, aphasia, or other deficits. He denies a history of stroke.    Review Of Systems: Per HPI with the following additions: denies chest pain and SOB Otherwise 12 point review of systems was performed and was unremarkable.  Patient Active Problem List   Diagnosis Date Noted  . Acute embolic stroke 04/17/2013  . Osteoarthritis of left hip 11/20/2012  . Localized osteoarthritis of left knee 11/20/2012  . ED (erectile dysfunction) 02/01/2012  . Gout 09/05/2011  . Hyperlipidemia 09/05/2011  . Hypertriglyceridemia 09/05/2011  . Arthritis 09/05/2011  . PVC (premature ventricular contraction) 09/05/2011  . Type II or unspecified type diabetes mellitus without mention of complication, not stated as uncontrolled 06/01/2011  . Essential hypertension, benign 06/01/2011  . OSA (obstructive sleep apnea) 06/01/2011   Past Medical History: Past Medical History  Diagnosis Date  . Anemia   . Arthritis   . Diabetes mellitus   . Hyperlipidemia   . Hypertension   . Sleep apnea     cpap  . Gout   . Osteoarthritis of left hip 11/20/2012  . Localized osteoarthritis of left knee 11/20/2012  . Neuromuscular disorder     " mild periferal neauropathy  . PVC (premature ventricular contraction)   . Migraine headache with aura    Past Surgical History: Past Surgical History  Procedure Laterality Date  . Cholecystectomy  1997  . Knee arthoscopy  2006  Left Knee  . Tonsillectomy and adenoidectomy      Childhood  . Total hip arthroplasty Left 11/20/2012    Dr Dion Saucier  . Replacement unicondylar joint knee Left 11/20/2012    Dr Dion Saucier  . Partial knee arthroplasty Left 11/20/2012    Procedure: UNICOMPARTMENTAL KNEE;   Surgeon: Eulas Post, MD;  Location: [facility] OR;  Service: Orthopedics;  Laterality: Left;  . Total hip arthroplasty Left 11/20/2012    Procedure: TOTAL HIP ARTHROPLASTY;  Surgeon: Eulas Post, MD;  Location: [facility] OR;  Service: Orthopedics;  Laterality: Left;   Social History: History  Substance Use Topics  . Smoking status: Never Smoker   . Smokeless tobacco: Never Used  . 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  Family History: Family History  Problem Relation Age of Onset  . Cancer Mother     Breast   Allergies and Medications: Allergies  Allergen Reactions  . Sulfa Antibiotics Other (See Comments)    Unknown reaction   No current facility-administered medications on file prior to encounter.   Current Outpatient Prescriptions on File Prior to Encounter  Medication Sig Dispense Refill  . allopurinol (ZYLOPRIM) 300 MG tablet Take 1 tablet (300 mg total) by mouth daily.  90 tablet  3  . aspirin 81 MG tablet Take 81 mg by mouth daily.       [name] losartan-hydrochlorothiazide (HYZAAR) 100-25 MG per tablet Take 1 tablet by mouth daily.  90 tablet  3  . metaxalone (SKELAXIN) 800 MG tablet Take 1 tablet (800 mg total) by mouth every 6 (six) hours as needed.  60 tablet  1  . saxagliptin HCl (ONGLYZA) 5 MG TABS tablet Take 1 tablet (5 mg total) by mouth daily.  90 tablet  3  . SM LANCETS 33G MISC USE TO TEST BLOOD SUGAR TWICE DAILY  400 each  PRN  . TRUETEST TEST test strip USE TO TEST TWICE DAILY  400 each  PRN    Objective: BP 134/90  Pulse 57  Temp(Src) 97.9 F (36.6 C) (Oral)  Resp 16  Ht 6\' 1"  (1.854 m)  Wt 234 lb 11.2 oz (106.459 kg)  BMI 30.97 kg/m2  SpO2 98% Exam: General: middle age WM, normal appearing, obese HEENT: NCAT, PERRLA, EOMI, OP clear and moist Cardiovascular: RRR, no murmurs Respiratory: CTA-B, normal WOB Abdomen: soft, NDNT Extremities: no tenderness or  swelling Skin: warm, dry, no rashes  Neuro Exam MENTAL STATUS Orientation: intact Memory: Recent  memory is intact and remote memory is intact Attention: Attention span is normal Language: speech is normal Fund of Knowledge: normal  CRANIAL NERVES: 2 (Optic): visual fields are intact to confrontation; funduscopic exam demonstrates 3,4,6 (EOM): pupils equal round and reactive; extra ocular movement intact 5 (Trigeminal):  no facial numbness; muscles of mastication are intact 7 (Facial): no facial weakness or asymmetry  8 (Auditory): no asymmetry  9,10 (Glossopharyngeal/Vagus): uvula is midline; palate elevate symmetrically 11 (Spinal Accessory): normal sternocleidomastoid and trapezius strength 12 (Hypoglossal) tongue is midline; no fasciculations or atrophy  MOTOR: Muscle Strength: Upper Extremity: 5/5, Lower Extremity: 5/5, Grip: 5/5 Muscle Tone: normal  COORDINATION:  intact rapid alternating movement, intact heel-to-shin,   SENSATION: Romberg intact; temperature/crude touch: intact  GAIT: normal   Labs and Imaging: CBC BMET   Recent Labs Lab 04/17/13 1240 04/17/13 1256  WBC 5.6  --   HGB 13.2 14.6  HCT 39.6 43.0  PLT 325  --     Recent Labs Lab 04/17/13 1240 04/17/13 1256  NA 136 140  K 4.0 4.0  CL 101 102  CO2 25  --   BUN 25* 25*  CREATININE 1.12 1.20  GLUCOSE 124* 121*  CALCIUM 10.1  --      Mr Brain Wo Contrast  04/17/2013   IMPRESSION: 1.5 cm acute infarction in the left occipital cortical and subcortical brain with an adjacent 3 mm infarction. Findings are consistent with embolic infarction. No hemorrhage or mass effect.      Garnetta Buddy, MD 04/17/2013, 6:40 PM PGY-3, [facility] FPTS Intern pager: [phone], text pages welcome

## 2013-04-17 NOTE — Consult Note (Addendum)
NEURO HOSPITALIST CONSULT NOTE    Reason for Consult: Acute onset HA and visual disturbances, left occipital infarct on MRI brain.  HPI:                                                                                                                                          Phillip [name] is an 62 y.o. 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 He stated that for the past 5 years he had been getting mainly visual auras, rather sporadically, but yesterday after 3 pm he was in his office seeing a patient and suddenly noticed that he was not able to see half of the face of his patient, then everything started going " squiggly and whiteness in my visual fields".  Dr. Alwyn Ren indicated that this was subsequently followed by a left sided HA mainly around the periorbital region, but the HA subsided and the visual disturbances lasted for several hours which is quite unusual. Of importance, he tells me that he took Relpax at the time of this migraine attack.  Decided to go to bed but when he woke up this morning he was still having trouble with his vision and then was asked by his colleague to have a brain MRI and evaluation by opthalmology. 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 MRI brain revealed an acute 1.5 cm area of acute infarction left occipital lobe. Takes aspirin 81 mg daily. At this moment, he complains of " a tiny impairment of my central vision".  Past Medical History  Diagnosis Date  . Anemia   . Arthritis   . Diabetes mellitus   . Hyperlipidemia   . Hypertension   . Sleep apnea     cpap  . Gout   .  Osteoarthritis of left hip 11/20/2012  . Localized osteoarthritis of left knee 11/20/2012  . Neuromuscular disorder     " mild periferal neauropathy  . PVC (premature ventricular contraction)   . Migraine headache with aura     Past Surgical History  Procedure Laterality Date  . Cholecystectomy  1997  . Knee arthoscopy  2006    Left Knee  . Tonsillectomy and adenoidectomy      Childhood  . Total hip arthroplasty Left 11/20/2012    Dr Dion Saucier  . Replacement unicondylar joint knee Left 11/20/2012    Dr Dion Saucier  . Partial knee arthroplasty Left 11/20/2012    Procedure: UNICOMPARTMENTAL KNEE;  Surgeon: Eulas Post, MD;  Location: [facility] OR;  Service: Orthopedics;  Laterality: Left;  . Total hip arthroplasty Left 11/20/2012    Procedure: TOTAL HIP ARTHROPLASTY;  Surgeon: Eulas Post, MD;  Location: [facility] OR;  Service: Orthopedics;  Laterality: Left;    Family History  Problem Relation Age of Onset  . Cancer Mother     Breast    Social History:  reports that he has never smoked. He has never used smokeless tobacco. He reports that he does not drink alcohol or use illicit drugs.  Allergies  Allergen Reactions  . Sulfa Antibiotics Other (See Comments)    Unknown reaction    MEDICATIONS:                                                                                                                     I have reviewed the patient's current medications.   ROS:                                                                                                                                       History obtained from the patient and chart review.  General ROS: negative for - chills, fatigue, fever, night sweats, weight gain or weight loss Psychological ROS: negative for - behavioral disorder, hallucinations, memory difficulties, mood swings or suicidal ideation Ophthalmic ROS: negative for - double vision or eye pain  ENT ROS: negative for - epistaxis, nasal discharge, oral lesions, sore  throat, tinnitus or vertigo Allergy and Immunology ROS: negative for - hives or itchy/watery eyes Hematological and Lymphatic ROS: negative for - bleeding problems, bruising or swollen lymph nodes Endocrine ROS: negative for - galactorrhea, hair pattern changes, polydipsia/polyuria or temperature intolerance Respiratory ROS: negative for - cough, hemoptysis, shortness of breath or wheezing Cardiovascular ROS: negative for - chest pain, dyspnea on exertion. Frequent palpitations. Gastrointestinal ROS: negative for - abdominal pain, diarrhea, hematemesis, nausea/vomiting or stool incontinence Genito-Urinary ROS: negative for - dysuria, hematuria, incontinence or urinary frequency/urgency Musculoskeletal ROS: negative for - joint swelling or muscular weakness Neurological ROS: as noted in HPI Dermatological ROS: negative for rash and skin lesion changes   Physical exam: pleasant male in no apparent distress. Blood pressure 134/90, pulse 57, temperature 97.9 F (36.6 C), temperature source Oral, resp. rate 16, height 6\' 1"  (1.854 m), weight 106.459 kg (234 lb 11.2 oz), SpO2 98.00%. Head: normocephalic. Neck: supple, no bruits, no JVD. Cardiac: no murmurs. Lungs: clear. Abdomen: soft, no tender, no mass. Extremities:  no edema.  Neurologic Examination:                                                                                                      Mental Status: Alert, oriented, thought content appropriate.  Speech fluent without evidence of aphasia.  Able to follow 3 step commands without difficulty. Cranial Nerves: II: Discs flat bilaterally; Visual fields grossly normal, pupils equal, round, reactive to light and accommodation III,IV, VI: ptosis not present, extra-ocular motions intact bilaterally V,VII: smile symmetric, facial light touch sensation normal bilaterally VIII: hearing normal bilaterally IX,X: gag reflex present XI: bilateral shoulder shrug XII: midline tongue extension  without atrophy or fasciculations  Motor: Right : Upper extremity   5/5    Left:     Upper extremity   5/5  Lower extremity   5/5     Lower extremity   5/5 Tone and bulk:normal tone throughout; no atrophy noted Sensory: Pinprick and light touch intact throughout, bilaterally Deep Tendon Reflexes:  Right: Upper Extremity   Left: Upper extremity   biceps (C-5 to C-6) 2/4   biceps (C-5 to C-6) 2/4 tricep (C7) 2/4    triceps (C7) 2/4 Brachioradialis (C6) 2/4  Brachioradialis (C6) 2/4  Lower Extremity Lower Extremity  quadriceps (L-2 to L-4) 2/4   quadriceps (L-2 to L-4) 2/4 Achilles (S1) 2/4   Achilles (S1) 2/4  Plantars: Right: downgoing   Left: downgoing Cerebellar: normal finger-to-nose,  normal heel-to-shin test Gait:  No ataxia. CV: pulses palpable throughout      Lab Results  Component Value Date/Time   CHOL 135 02/02/2013  8:00 AM    Results for orders placed during the hospital encounter of 04/17/13 (from the past 48 hour(s))  ETHANOL     Status: None   Collection Time    04/17/13 12:40 PM      Result Value Range   Alcohol, Ethyl (B) <11  0 - 11 mg/dL   Comment:            LOWEST DETECTABLE LIMIT FOR     SERUM ALCOHOL IS 11 mg/dL     FOR MEDICAL PURPOSES ONLY  PROTIME-INR     Status: None   Collection Time    04/17/13 12:40 PM      Result Value Range   Prothrombin Time 12.2  11.6 - 15.2 seconds   INR 0.92  0.00 - 1.49  APTT     Status: None   Collection Time    04/17/13 12:40 PM      Result Value Range   aPTT 26  24 - 37 seconds  CBC     Status: None   Collection Time    04/17/13 12:40 PM      Result Value Range   WBC 5.6  4.0 - 10.5 K/uL   RBC 4.47  4.22 - 5.81 MIL/uL   Hemoglobin 13.2  13.0 - 17.0 g/dL   HCT 45.4  09.8 - 11.9 %   MCV 88.6  78.0 - 100.0 fL   MCH 29.5  26.0 - 34.0 pg  MCHC 33.3  30.0 - 36.0 g/dL   RDW 16.1  09.6 - 04.5 %   Platelets 325  150 - 400 K/uL  DIFFERENTIAL     Status: None   Collection Time    04/17/13 12:40 PM       Result Value Range   Neutrophils Relative % 67  43 - 77 %   Neutro Abs 3.7  1.7 - 7.7 K/uL   Lymphocytes Relative 24  12 - 46 %   Lymphs Abs 1.4  0.7 - 4.0 K/uL   Monocytes Relative 8  3 - 12 %   Monocytes Absolute 0.4  0.1 - 1.0 K/uL   Eosinophils Relative 1  0 - 5 %   Eosinophils Absolute 0.1  0.0 - 0.7 K/uL   Basophils Relative 0  0 - 1 %   Basophils Absolute 0.0  0.0 - 0.1 K/uL  COMPREHENSIVE METABOLIC PANEL     Status: Abnormal   Collection Time    04/17/13 12:40 PM      Result Value Range   Sodium 136  135 - 145 mEq/L   Potassium 4.0  3.5 - 5.1 mEq/L   Chloride 101  96 - 112 mEq/L   CO2 25  19 - 32 mEq/L   Glucose, Bld 124 (*) 70 - 99 mg/dL   BUN 25 (*) 6 - 23 mg/dL   Creatinine, Ser 4.09  0.50 - 1.35 mg/dL   Calcium 81.1  8.4 - 91.4 mg/dL   Total Protein 7.1  6.0 - 8.3 g/dL   Albumin 4.4  3.5 - 5.2 g/dL   AST 14  0 - 37 U/L   ALT 17  0 - 53 U/L   Alkaline Phosphatase 44  39 - 117 U/L   Total Bilirubin 0.2 (*) 0.3 - 1.2 mg/dL   GFR calc non Af Amer 68 (*) >90 mL/min   GFR calc Af Amer 78 (*) >90 mL/min   Comment: (NOTE)     The eGFR has been calculated using the CKD EPI equation.     This calculation has not been validated in all clinical situations.     eGFR's persistently <90 mL/min signify possible Chronic Kidney     Disease.  TROPONIN I     Status: None   Collection Time    04/17/13 12:40 PM      Result Value Range   Troponin I <0.30  <0.30 ng/mL   Comment:            Due to the release kinetics of cTnI,     a negative result within the first hours     of the onset of symptoms does not rule out     myocardial infarction with certainty.     If myocardial infarction is still suspected,     repeat the test at appropriate intervals.  POCT I-STAT TROPONIN I     Status: None   Collection Time    04/17/13 12:55 PM      Result Value Range   Troponin i, poc 0.00  0.00 - 0.08 ng/mL   Comment 3            Comment: Due to the release kinetics of cTnI,     a  negative result within the first hours     of the onset of symptoms does not rule out     myocardial infarction with certainty.     If myocardial infarction is still suspected,  repeat the test at appropriate intervals.  POCT I-STAT, CHEM 8     Status: Abnormal   Collection Time    04/17/13 12:56 PM      Result Value Range   Sodium 140  135 - 145 mEq/L   Potassium 4.0  3.5 - 5.1 mEq/L   Chloride 102  96 - 112 mEq/L   BUN 25 (*) 6 - 23 mg/dL   Creatinine, Ser 7.84  0.50 - 1.35 mg/dL   Glucose, Bld 696 (*) 70 - 99 mg/dL   Calcium, Ion 2.95  2.84 - 1.30 mmol/L   TCO2 25  0 - 100 mmol/L   Hemoglobin 14.6  13.0 - 17.0 g/dL   HCT 13.2  44.0 - 10.2 %  URINE RAPID DRUG SCREEN (HOSP PERFORMED)     Status: None   Collection Time    04/17/13  1:06 PM      Result Value Range   Opiates NONE DETECTED  NONE DETECTED   Cocaine NONE DETECTED  NONE DETECTED   Benzodiazepines NONE DETECTED  NONE DETECTED   Amphetamines NONE DETECTED  NONE DETECTED   Tetrahydrocannabinol NONE DETECTED  NONE DETECTED   Barbiturates NONE DETECTED  NONE DETECTED   Comment:            DRUG SCREEN FOR MEDICAL PURPOSES     ONLY.  IF CONFIRMATION IS NEEDED     FOR ANY PURPOSE, NOTIFY LAB     WITHIN 5 DAYS.                LOWEST DETECTABLE LIMITS     FOR URINE DRUG SCREEN     Drug Class       Cutoff (ng/mL)     Amphetamine      1000     Barbiturate      200     Benzodiazepine   200     Tricyclics       300     Opiates          300     Cocaine          300     THC              50  URINALYSIS, ROUTINE W REFLEX MICROSCOPIC     Status: None   Collection Time    04/17/13  1:06 PM      Result Value Range   Color, Urine YELLOW  YELLOW   APPearance CLEAR  CLEAR   Specific Gravity, Urine 1.014  1.005 - 1.030   pH 6.5  5.0 - 8.0   Glucose, UA NEGATIVE  NEGATIVE mg/dL   Hgb urine dipstick NEGATIVE  NEGATIVE   Bilirubin Urine NEGATIVE  NEGATIVE   Ketones, ur NEGATIVE  NEGATIVE mg/dL   Protein, ur NEGATIVE   NEGATIVE mg/dL   Urobilinogen, UA 1.0  0.0 - 1.0 mg/dL   Nitrite NEGATIVE  NEGATIVE   Leukocytes, UA NEGATIVE  NEGATIVE   Comment: MICROSCOPIC NOT DONE ON URINES WITH NEGATIVE PROTEIN, BLOOD, LEUKOCYTES, NITRITE, OR GLUCOSE <1000 mg/dL.  GLUCOSE, CAPILLARY     Status: Abnormal   Collection Time    04/17/13  1:36 PM      Result Value Range   Glucose-Capillary 126 (*) 70 - 99 mg/dL   Comment 1 Notify RN      Mr Brain Wo Contrast  04/17/2013   CLINICAL DATA:  Headache with visual disturbance.  EXAM: MRI HEAD WITHOUT CONTRAST  TECHNIQUE: Multiplanar, multiecho  pulse sequences of the brain and surrounding structures were obtained without intravenous contrast.  COMPARISON:  None.  FINDINGS: Diffusion imaging shows a 1.5 cm acute infarction in the left occipital lobe with an adjacent 3 mm focus of infarction. No evidence of swelling or hemorrhage. No other acute insult.  The brainstem is normal. No cerebellar abnormality. Cerebral hemispheres are otherwise normal except for a very few punctate white matter foci in the frontal white matter, not likely significant. There is not a pattern of widespread small vessel disease. No mass lesion, hydrocephalus or extra-axial collection. Flow appears to be present in the major vessels at the base of the brain. No pituitary mass. No inflammatory sinus disease. No skull or skullbase lesion.  IMPRESSION: 1.5 cm acute infarction in the left occipital cortical and subcortical brain with an adjacent 3 mm infarction. Findings are consistent with embolic infarction. No hemorrhage or mass effect.  These results were called by telephone at the time of interpretation on 04/17/2013 at 11:35 AM to Dr. Lesle Chris , who verbally acknowledged these results. I also discussed the case directly with the patient.   Electronically Signed   By: Paulina Fusi M.D.   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 He has history of episodic migraine with and without aura but also other risks factors for stroke and it is unclear if his current stroke is migraine with aura-related or embolism to the left PCA territory arising from an unknown source associat He said that he had had several unremarkable TTEs and will let the stroke team decide regarding the need for TEE (migraine and PFO association)  He will need to complete stroke work. Plavix was already added to aspirin 81 mg daily. Patient can not take Triptans for abortive migraine treatment due to history of migraine with aura and new stroke. Stroke team will resume care in am.  Wyatt Portela, MD 04/17/2013, 8:44 PM [facility].

## 2013-04-17 NOTE — ED Notes (Addendum)
Pt brought to ED after receiving MRI. Pstates he felt like he was having a migraine yesterday at 3:15, stating that he felt a migraine aura with "everything going squiggly and whiteness in his visual fields". Pt came in for MRI at 10:30am, where pt states they saw a "1.5cm infarction in his occipital lobe". Pt's pupils are dilated, due to him getting his eyes dilated at the opthalmologic this morning.

## 2013-04-17 NOTE — Telephone Encounter (Signed)
Phone call last night with patient having a severe left periorbital headache. He had an aura prior to the headache. He has a persistent visual field defect which involves both thighs. We'll schedule an MRI of the brain emergently. We'll also see if Dr. Dione Booze can do a visual field test on him.

## 2013-04-17 NOTE — ED Notes (Signed)
Bed: WA09 Expected date:  Expected time:  Means of arrival:  Comments: Stroke- D. Alwyn Ren

## 2013-04-18 ENCOUNTER — Inpatient Hospital Stay (HOSPITAL_COMMUNITY): Payer: 59

## 2013-04-18 ENCOUNTER — Encounter (HOSPITAL_COMMUNITY): Payer: Self-pay | Admitting: Nurse Practitioner

## 2013-04-18 DIAGNOSIS — E119 Type 2 diabetes mellitus without complications: Secondary | ICD-10-CM

## 2013-04-18 DIAGNOSIS — G43109 Migraine with aura, not intractable, without status migrainosus: Secondary | ICD-10-CM

## 2013-04-18 DIAGNOSIS — E781 Pure hyperglyceridemia: Secondary | ICD-10-CM

## 2013-04-18 DIAGNOSIS — I10 Essential (primary) hypertension: Secondary | ICD-10-CM

## 2013-04-18 DIAGNOSIS — I4949 Other premature depolarization: Secondary | ICD-10-CM

## 2013-04-18 DIAGNOSIS — E785 Hyperlipidemia, unspecified: Secondary | ICD-10-CM

## 2013-04-18 DIAGNOSIS — G4733 Obstructive sleep apnea (adult) (pediatric): Secondary | ICD-10-CM

## 2013-04-18 LAB — LIPID PANEL
Cholesterol: 121 mg/dL (ref 0–200)
HDL: 37 mg/dL — ABNORMAL LOW (ref >39)
Total CHOL/HDL Ratio: 3.3 RATIO
Triglycerides: 179 mg/dL — ABNORMAL HIGH (ref <150)
VLDL: 36 mg/dL (ref 0–40)

## 2013-04-18 LAB — GLUCOSE, CAPILLARY
Glucose-Capillary: 183 mg/dL — ABNORMAL HIGH (ref 70–99)
Glucose-Capillary: 172 mg/dL — ABNORMAL HIGH (ref 70–99)

## 2013-04-18 LAB — HEMOGLOBIN A1C
Hgb A1c MFr Bld: 7.7 % — ABNORMAL HIGH
Mean Plasma Glucose: 174 mg/dL — ABNORMAL HIGH

## 2013-04-18 MED ORDER — HYDRALAZINE HCL 10 MG PO TABS
10.0000 mg | ORAL_TABLET | Freq: Four times a day (QID) | ORAL | Status: DC | PRN
Start: 2013-04-18 — End: 2013-04-19
  Filled 2013-04-18: qty 1

## 2013-04-18 MED ORDER — HYDROCHLOROTHIAZIDE 25 MG PO TABS
25.0000 mg | ORAL_TABLET | Freq: Every day | ORAL | Status: DC
  Administered 2013-04-19: 25 mg via ORAL
  Filled 2013-04-18 (×2): qty 1

## 2013-04-18 MED ORDER — METOPROLOL SUCCINATE ER 50 MG PO TB24
50.0000 mg | ORAL_TABLET | Freq: Every day | ORAL | Status: DC
  Administered 2013-04-18 – 2013-04-19 (×2): 50 mg via ORAL
  Filled 2013-04-18 (×2): qty 1

## 2013-04-18 MED ORDER — LOSARTAN POTASSIUM 50 MG PO TABS
100.0000 mg | ORAL_TABLET | Freq: Every day | ORAL | Status: DC
  Administered 2013-04-18 – 2013-04-19 (×2): 100 mg via ORAL
  Filled 2013-04-18 (×2): qty 2

## 2013-04-18 NOTE — Progress Notes (Signed)
FMTS Attending Note Patient seen and examined by me this morning; discussed with resident team and I agree with Dr Nyra Capes assessment and plan.  Appreciate Stroke Team's consultation and plan.  I would appreciate their weighing in on benefit of initiating statin therapy for secondary stroke prevention; patient expresses reservations about this.  Paula Compton, MD

## 2013-04-18 NOTE — Progress Notes (Signed)
Family Medicine Teaching Service Daily Progress Note Intern Pager: [phone]  Patient name: Phillip [name] Medical record number: [id number] Date of birth: Aug 06, 1950 Age: 62 y.o. Gender: male  Primary Care Provider: Lucilla Edin, MD Consultants: Neuro Code Status: Full  Pt Overview and Major Events to Date:   Assessment and Plan: Phillip [name] is a 62 y.o. male presenting with acute occipital infarct. PMH is significant for diabetes (well controlled), hypertension and gout.   # Stroke, Acute - Presenting symptom was visual field deficit. Located in left occipital cortex and subcortical area, 1.5 cm; presented too late for Code Stroke; has long standing hx of migraine with/w/o aura. Has hx of several unremarkable TTEs. Question of whether this could be migrainous? (am unaware of relationship btw migraine and persistent mri findings)  - Plavix at 75 mg daily with daily aspirin for 30 days, followed by daily plavix  - Encourage starting statin, pt previously intolerant due to myalgias, although LDL 48 currently, would not treat to goal - Standard work up with MRA, carotid doppler, echo, lipid panel, TSH, A1C  - Allow permissive hypertension to 220/110 in acute stroke setting  - neuro stroke team to assess this morning  # HTN - BP 110-120s this morning - Hold home arb, HCTZ, and beta blocker   # Diabetes - Last A1C 6.6  - Check A1C in AM  - Hold metformin and onlgyza in inpatient setting, substitute moderate sliding scale novolog   # Obstructive Sleep Apnea  - Cont CPAP   # Gout  - Cont home allopurinol   # Osteoarthritis - S/p left hip replacement and partial left knee  - Avoid NSAIDs given pro-thrombotic characters   FEN/GI: DD, SLIV PPx: lovenox  Disposition: completion of stroke w/up  Subjective: Feels mildly improved this morning, small changes in visual field, attests to some dragging sensation when reading from left to right  Objective: Temp:  [97.4 F (36.3 C)-98.1  F (36.7 C)] 97.4 F (36.3 C) (12/18 0538) Pulse Rate:  [55-69] 55 (12/18 0538) Resp:  [16-23] 18 (12/18 0538) BP: (115-158)/(70-90) 115/74 mmHg (12/18 0538) SpO2:  [94 %-100 %] 100 % (12/18 0538) Weight:  [234 lb 11.2 oz (106.459 kg)] 234 lb 11.2 oz (106.459 kg) (12/17 1800) Physical Exam: General: NAD, well appearing, sitting up in chair Cardiovascular: RRR, no m/r/g Respiratory: CTAB, no wheezing Abdomen: soft, nt/nd Extremities: WWP, trace edema in left leg (chronic per pt report) Neuro: CNII-XII intact apart from some deficits with visual field testing by confrontation in right field, sensation equal and symmetric, strength 5/5 in bilat hand grip and hip flexion  Laboratory:  Recent Labs Lab 04/17/13 1240 04/17/13 1256 04/17/13 2135  WBC 5.6  --  6.8  HGB 13.2 14.6 13.6  HCT 39.6 43.0 40.3  PLT 325  --  334    Recent Labs Lab 04/17/13 1240 04/17/13 1256 04/17/13 2135  NA 136 140  --   K 4.0 4.0  --   CL 101 102  --   CO2 25  --   --   BUN 25* 25*  --   CREATININE 1.12 1.20 1.13  CALCIUM 10.1  --   --   PROT 7.1  --   --   BILITOT 0.2*  --   --   ALKPHOS 44  --   --   ALT 17  --   --   AST 14  --   --   GLUCOSE 124* 121*  --  Imaging/Diagnostic Tests: Mr [name] Contrast  04/17/2013 IMPRESSION: 1.5 cm acute infarction in the left occipital cortical and subcortical brain with an adjacent 3 mm infarction. Findings are consistent with embolic infarction. No hemorrhage or mass effect.    Anselm Lis, MD 04/18/2013, 7:20 AM PGY-1, [facility] Family Medicine FPTS Intern pager: [phone], text pages welcome

## 2013-04-18 NOTE — H&P (Signed)
FMTS Attending Admission Note: Renold Don MD Personal pager:  [phone] FPTS Service Pager:  [phone]  I  have seen and examined this patient, reviewed their chart. I have discussed this patient with the resident. I agree with the resident's findings, assessment and care plan.  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  Aura described as visual impairment (inability to see Left visual field) and "squiggly lines" which is his usual aura.  Accompanied by significant headache which did not resolve as day progressed.  Due to concern, called PCP who ordered stat MRI, which took place yesterday morning.  Revealed left sided cortical lesion.  Patient diagnosed with cortical stroke and admitted for further workup.  Patient on ASA and not on statin due to myalgias.   Exam: Gen:  Caucasian male sitting on side of bed, typing on his computer.  Conversant and cooperative Heart:  RRR Lungs:  Clear Ext:  No edema/redness Neuro:  Alert and oriented.  No focal deficits noted, although patient does look ever so slightly to the left when focusing on something.    Imp/Plan: 1.  Cortical lesion on MRI: - Appears to be stroke, would likely be embolic in nature. - sudden onset of visual symptoms rather than gradual development favors stroke over persistent migraine - however, unclear etiology of stroke.  No palpitations, no history of a fib or CAD.  No PFO seen on prior TTE's.   - Appreciate neuro involvement.  Question for stroke team would be whether truly a stroke or if prolonged migraine mightshow similar cortical lesion on MRI.  Would change management drastically if not stroke - Otherwise continue stroke wu  2.  Other chronic medical issues: - as per resident note.    Tobey Grim, MD 04/18/2013 8:32 AM

## 2013-04-18 NOTE — Evaluation (Signed)
Physical Therapy Evaluation Patient Details Name: Phillip [name] MRN: [id number] DOB: January 08, 1951 Today's Date: 04/18/2013 Time: 1191-4782 PT Time Calculation (min): 15 min  PT Assessment / Plan / Recommendation History of Present Illness  Phillip [name] is a 62 y.o. male presenting with acute occipital infarct.  Clinical Impression  Patient is independent with all aspects of mobility. No acute needs, PT will sign off.    PT Assessment  Patent does not need any further PT services    Follow Up Recommendations  No PT follow up    Does the patient have the potential to tolerate intense rehabilitation      Barriers to Discharge        Equipment Recommendations  None recommended by PT    Recommendations for Other Services     Frequency      Precautions / Restrictions Precautions Precautions: None   Pertinent Vitals/Pain No pain at this time      Mobility  Bed Mobility Bed Mobility: Not assessed Transfers Transfers: Sit to Stand;Stand to Sit Sit to Stand: 7: Independent Stand to Sit: 7: Independent Ambulation/Gait Ambulation/Gait Assistance: 7: Independent Assistive device: None Ambulation/Gait Assistance Details: WFL Modified Rankin (Stroke Patients Only) Pre-Morbid Rankin Score: No symptoms Modified Rankin: No significant disability       PT Goals(Current goals can be found in the care plan section) Acute Rehab PT Goals PT Goal Formulation: No goals set, d/c therapy  Visit Information  Last PT Received On: 04/18/13 Assistance Needed: +1 History of Present Illness: Phillip [name] is a 62 y.o. male presenting with acute occipital infarct.       Prior Functioning  Home Living Family/patient expects to be discharged to:: Private residence Living Arrangements: Spouse/significant other Available Help at Discharge: Available 24 hours/day;Family Type of Home: House Home Access: Stairs to enter [employer] of Steps: 3 Entrance Stairs-Rails:  Right Home Layout: Two level;Able to live on main level with bedroom/bathroom;Full bath on main level Home Equipment: Bedside commode;Shower seat;Hand held shower head Prior Function Level of Independence: Independent Communication Communication: No difficulties Dominant Hand: Right    Cognition  Cognition Arousal/Alertness: Awake/alert Behavior During Therapy: WFL for tasks assessed/performed Overall Cognitive Status: Within Functional Limits for tasks assessed    Extremity/Trunk Assessment Upper Extremity Assessment Upper Extremity Assessment: Overall WFL for tasks assessed Lower Extremity Assessment Lower Extremity Assessment: Overall WFL for tasks assessed   Balance Balance Balance Assessed: Yes Standardized Balance Assessment Standardized Balance Assessment: Dynamic Gait Index Dynamic Gait Index Level Surface: Normal Change in Gait Speed: Normal Gait with Horizontal Head Turns: Normal Gait with Vertical Head Turns: Normal Gait and Pivot Turn: Normal Step Over Obstacle: Normal Step Around Obstacles: Normal Steps: Normal Total Score: 24 High Level Balance High Level Balance Activites: Side stepping;Backward walking;Direction changes;Turns;Sudden stops;Head turns High Level Balance Comments: independent  End of Session PT - End of Session Activity Tolerance: Patient tolerated treatment well Patient left: in chair Nurse Communication: Mobility status  GP     Fabio Asa 04/18/2013, 4:04 PM Charlotte Crumb, PT DPT  [phone]

## 2013-04-18 NOTE — Progress Notes (Signed)
Utilization review completed. Jamarcus Laduke, RN, BSN. 

## 2013-04-18 NOTE — Progress Notes (Signed)
VASCULAR LAB PRELIMINARY  PRELIMINARY  PRELIMINARY  PRELIMINARY  Carotid duplex completed.    Preliminary report:  Bilateral:  1-39% ICA stenosis lowest end of scale.  Vertebral artery flow is antegrade.     Najmo Pardue, RVS 04/18/2013, 1:36 PM

## 2013-04-18 NOTE — Progress Notes (Signed)
Stroke Team Progress Note  HISTORY Phillip [name] is an 62 y.o. 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  He stated that for the past 5 years he had been getting mainly visual auras, rather sporadically, but yesterday 04/16/2013 after 3 pm he was in his office seeing a patient and suddenly noticed that he was not able to see half of the face of his patient, then everything started going " squiggly and whiteness in my visual fields". Dr. Alwyn Ren indicated that this was subsequently followed by a left sided HA mainly around the periorbital region, but the HA subsided and the visual disturbances lasted for several hours which is quite unusual. Of importance, he tells me that he took Relpax at the time of this migraine attack. Decided to go to bed but when he woke up this morning 04/17/2013 he was still having trouble with his vision and then was asked by his colleague to have a brain MRI and evaluation by opthalmology. 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 MRI brain revealed an acute 1.5 cm area of acute infarction left occipital lobe. Takes aspirin 81 mg daily.  At this moment, he complains of " a tiny impairment of my central vision".  Patient was not a TPA candidate secondary to delay in arrival. He was admitted for further evaluation and treatment.  SUBJECTIVE His wife is at the bedside (she is/was a PICU RN).  Overall he feels his condition is gradually improving, but not resolved.   OBJECTIVE Most recent Vital Signs: Filed Vitals:   04/18/13 0132 04/18/13 0401 04/18/13 0538 04/18/13 0927   BP: 130/74 115/73 115/74 146/97  Pulse: 59 60 55 78  Temp:  97.8 F (36.6 C) 97.4 F (36.3 C) 98 F (36.7 C)  TempSrc:  Oral Oral Oral  Resp: 16 18 18 20   Height:      Weight:      SpO2: 95% 99% 100% 96%   CBG (last 3)   Recent Labs  04/17/13 1336 04/17/13 2234 04/18/13 0647  GLUCAP 126* 185* 144*    IV Fluid Intake:     MEDICATIONS  . allopurinol  300 mg Oral Daily  . aspirin EC  81 mg Oral Daily  . clopidogrel  75 mg Oral Q breakfast  . enoxaparin (LOVENOX) injection  40 mg Subcutaneous Q24H  . insulin aspart  0-15 Units Subcutaneous TID WC  . sodium chloride  3 mL Intravenous Q12H   PRN:  sodium chloride, acetaminophen, senna-docusate, sodium chloride, zolpidem  Diet:  Carb Control thin liquids Activity:  As tolerated DVT Prophylaxis:  Lovenox 40 mg sq daily   CLINICALLY SIGNIFICANT STUDIES Basic Metabolic Panel:   Recent Labs Lab 04/17/13 1240 04/17/13 1256 04/17/13 2135  NA 136 140  --   K 4.0 4.0  --   CL 101 102  --   CO2 25  --   --   GLUCOSE 124* 121*  --   BUN 25* 25*  --   CREATININE 1.12 1.20 1.13  CALCIUM 10.1  --   --    Liver Function Tests:   Recent Labs Lab 04/17/13 1240  AST 14  ALT 17  ALKPHOS  44  BILITOT 0.2*  PROT 7.1  ALBUMIN 4.4   CBC:   Recent Labs Lab 04/17/13 1240 04/17/13 1256 04/17/13 2135  WBC 5.6  --  6.8  NEUTROABS 3.7  --   --   HGB 13.2 14.6 13.6  HCT 39.6 43.0 40.3  MCV 88.6  --  89.0  PLT 325  --  334   Coagulation:   Recent Labs Lab 04/17/13 1240  LABPROT 12.2  INR 0.92   Cardiac Enzymes:   Recent Labs Lab 04/17/13 1240  TROPONINI <0.30   Urinalysis:   Recent Labs Lab 04/17/13 1306  COLORURINE YELLOW  LABSPEC 1.014  PHURINE 6.5  GLUCOSEU NEGATIVE  HGBUR NEGATIVE  BILIRUBINUR NEGATIVE  KETONESUR NEGATIVE  PROTEINUR NEGATIVE  UROBILINOGEN 1.0  NITRITE NEGATIVE  LEUKOCYTESUR NEGATIVE   Lipid Panel    Component Value Date/Time   CHOL 121 04/18/2013 0533   TRIG 179*  04/18/2013 0533   HDL 37* 04/18/2013 0533   CHOLHDL 3.3 04/18/2013 0533   VLDL 36 04/18/2013 0533   LDLCALC 48 04/18/2013 0533   HgbA1C  Lab Results  Component Value Date   HGBA1C 6.6 02/02/2013    Urine Drug Screen:     Component Value Date/Time   LABOPIA NONE DETECTED 04/17/2013 1306   COCAINSCRNUR NONE DETECTED 04/17/2013 1306   LABBENZ NONE DETECTED 04/17/2013 1306   AMPHETMU NONE DETECTED 04/17/2013 1306   THCU NONE DETECTED 04/17/2013 1306   LABBARB NONE DETECTED 04/17/2013 1306    Alcohol Level:   Recent Labs Lab 04/17/13 1240  ETH <11    CT of the brain  04/18/2013    Old granulomatous disease.  Minimal left basilar atelectasis.   MRI of the brain  04/17/2013   1.5 cm acute infarction in the left occipital cortical and subcortical brain with an adjacent 3 mm infarction. Findings are consistent with embolic infarction. No hemorrhage or mass effect.   MRA of the brain    2D Echocardiogram    Carotid Doppler    CXR    EKG  normal sinus rhythm, IVCD, consider atypical RBBB, old inferior infarct.   Therapy Recommendations   Physical Exam   Pleasant middle aged Caucasian male not in distress.Awake alert. Afebrile. Head is nontraumatic. Neck is supple without bruit. Hearing is normal. Cardiac exam no murmur or gallop. Lungs are clear to auscultation. Distal pulses are well felt. Neurological Exam ;  Awake  Alert oriented x 3. Normal speech and language.eye movements full without nystagmus.fundi were not visualized. Vision acuity and fields appear normal. Hearing is normal. Palatal movements are normal. Face symmetric. Tongue midline. Normal strength, tone, reflexes and coordination. Normal sensation. Gait deferred. ASSESSMENT [name] is a 62 y.o. male presenting with acute onset HA and visual disturbances. Headache was atypical with visual disturbances not resolving. Imaging confirms a left occipital infarct. Infarct felt to be embolic secondary to   unknown etiology.  It could be associated with migraine, however, rare to see a migrainous infarct, but it has been reported in migraines with extended aura. On aspirin 81 mg orally every day prior to admission. Now on clopidogrel 75 mg orally every day for secondary stroke prevention. Patient with resultant right homonymous hemianopsia, do not expect long-term vision loss given stroke location. Work up underway.  Hypertension Hyperlipidemia, LDL 48, on no statin PTA, now on no statin, at goal LDL < 70 for diabetics Diabetes, HgbA1c 6.04 Feb 2013, at goal < 7.0 OSA, uses CPAP at home Hx  migraines with aura Family history of stoke (grandmother, mother, aunt, uncle)  Hospital day # 1  TREATMENT/PLAN  Continue clopidogrel 75 mg orally every day for secondary stroke prevention.  Complete stroke workup:  MRA, 2D, carotid doppler  Therapy evals TEE to look for embolic source. Arranged with [facility] for tomorrow.  If positive for PFO (patent foramen ovale), check bilateral lower extremity venous dopplers to rule out DVT as possible source of stroke. (I have made patient NPO after midnight tonight). If TEE negative, a [facility] electrophysiologist will consult and place implantable loop recorder to evaluate for atrial fibrillation as etiology of stroke. This has been explained to patient/family by Dr. Pearlean Brownie and they are agreeable.  Will discontinue 2D as one done July 2014.  Annie Main, MSN, RN, ANVP-BC, ANP-BC, [facility] Pager: [phone] 04/18/2013 10:21 AM  I have personally obtained a history, examined the patient, evaluated imaging results, and formulated the assessment and plan of care. I agree with the above. Delia Heady, MD

## 2013-04-19 ENCOUNTER — Encounter (HOSPITAL_COMMUNITY): Payer: Self-pay | Admitting: *Deleted

## 2013-04-19 ENCOUNTER — Encounter (HOSPITAL_COMMUNITY)
Admission: EM | Disposition: A | Discharge status: Home / Self Care | Payer: Self-pay | Source: Home / Self Care | Attending: Family Medicine

## 2013-04-19 DIAGNOSIS — I6789 Other cerebrovascular disease: Secondary | ICD-10-CM

## 2013-04-19 DIAGNOSIS — I6350 Cerebral infarction due to unspecified occlusion or stenosis of unspecified cerebral artery: Secondary | ICD-10-CM

## 2013-04-19 HISTORY — PX: LOOP RECORDER IMPLANT: SHX5477

## 2013-04-19 HISTORY — PX: TEE WITHOUT CARDIOVERSION: SHX5443

## 2013-04-19 LAB — CREATININE, SERUM
Creatinine, Ser: 0.91 mg/dL (ref 0.50–1.35)
GFR calc Af Amer: >90 mL/min (ref >90)

## 2013-04-19 LAB — CBC
Hemoglobin: 11.9 g/dL — ABNORMAL LOW (ref 13.0–17.0)
MCH: 30.3 pg (ref 26.0–34.0)
MCHC: 34.3 g/dL (ref 30.0–36.0)
RDW: 12.5 % (ref 11.5–15.5)

## 2013-04-19 LAB — GLUCOSE, CAPILLARY: Glucose-Capillary: 150 mg/dL — ABNORMAL HIGH (ref 70–99)

## 2013-04-19 SURGERY — ECHOCARDIOGRAM, TRANSESOPHAGEAL
Anesthesia: Moderate Sedation

## 2013-04-19 SURGERY — LOOP RECORDER IMPLANT
Anesthesia: LOCAL

## 2013-04-19 MED ORDER — SODIUM CHLORIDE 0.9 % IV SOLN
INTRAVENOUS | Status: DC
  Administered 2013-04-19: 09:00:00 via INTRAVENOUS

## 2013-04-19 MED ORDER — MIDAZOLAM HCL 10 MG/2ML IJ SOLN
INTRAMUSCULAR | Status: DC | PRN
  Administered 2013-04-19 (×2): 2 mg via INTRAVENOUS

## 2013-04-19 MED ORDER — FENTANYL CITRATE 0.05 MG/ML IJ SOLN
INTRAMUSCULAR | Status: DC | PRN
  Administered 2013-04-19 (×2): 25 ug via INTRAVENOUS

## 2013-04-19 MED ORDER — ATORVASTATIN CALCIUM 40 MG PO TABS: 40.0000 mg | ORAL_TABLET | Freq: Every day | ORAL | Status: DC

## 2013-04-19 MED ORDER — ATORVASTATIN CALCIUM 20 MG PO TABS: 20.0000 mg | ORAL_TABLET | Freq: Every day | ORAL | Status: DC

## 2013-04-19 MED ORDER — HEPARIN SODIUM (PORCINE) 5000 UNIT/ML IJ SOLN
5000.0000 [IU] | Freq: Three times a day (TID) | INTRAMUSCULAR | Status: DC
  Filled 2013-04-19 (×2): qty 1

## 2013-04-19 MED ORDER — LIDOCAINE-EPINEPHRINE 1 %-1:100000 IJ SOLN
INTRAMUSCULAR | Status: AC
  Filled 2013-04-19: qty 1

## 2013-04-19 MED ORDER — FENTANYL CITRATE 0.05 MG/ML IJ SOLN
INTRAMUSCULAR | Status: AC
  Filled 2013-04-19: qty 2

## 2013-04-19 MED ORDER — ONDANSETRON HCL 4 MG/2ML IJ SOLN: 4.0000 mg | Freq: Four times a day (QID) | INTRAMUSCULAR | Status: DC | PRN

## 2013-04-19 MED ORDER — BUTAMBEN-TETRACAINE-BENZOCAINE 2-2-14 % EX AERO
INHALATION_SPRAY | CUTANEOUS | Status: DC | PRN
  Administered 2013-04-19: 2 via TOPICAL

## 2013-04-19 MED ORDER — CARVEDILOL 6.25 MG PO TABS: 6.2500 mg | ORAL_TABLET | Freq: Two times a day (BID) | ORAL | Status: DC

## 2013-04-19 MED ORDER — MIDAZOLAM HCL 5 MG/ML IJ SOLN
INTRAMUSCULAR | Status: AC
  Filled 2013-04-19: qty 2

## 2013-04-19 MED ORDER — ACETAMINOPHEN 325 MG PO TABS: 325.0000 mg | ORAL_TABLET | ORAL | Status: DC | PRN

## 2013-04-19 MED ORDER — CLOPIDOGREL BISULFATE 75 MG PO TABS: 75.0000 mg | ORAL_TABLET | Freq: Every day | ORAL | Status: DC

## 2013-04-19 MED ORDER — CHLORHEXIDINE GLUCONATE 4 % EX LIQD
60.0000 mL | Freq: Once | CUTANEOUS | Status: DC
  Filled 2013-04-19: qty 60

## 2013-04-19 NOTE — CV Procedure (Signed)
Procedure: TEE  Indication: CVA  Sedation: Versed 4 mg IV, Fentanyl 50 mcg IV  Findings: Please see echo section for full report.  Normal LV size and systolic function, EF 55%.  Normal RV size and systolic function.  Normal atrial sizes.  No significant valvular abnormalities.  No LAA thrombus.  Mild plaque in descending thoracic aorta.  Bubble study negative for PFO.   No source of embolus noted.   Marca Ancona 04/19/2013 11:02 AM

## 2013-04-19 NOTE — Progress Notes (Signed)
Came to visit patient at bedside on behalf of [facility] Care Management/ Link to Wellness program for [facility] employees with [facility] insurance. Patient declines having any Link to Wellness needs at this time. States that he has plenty of close follow up. Appreciative of the visit. Left contact information to call in future if needed.  Raiford Noble, MSN- Ed, RN,BSN- [facility] Liaison-[phone]

## 2013-04-19 NOTE — Progress Notes (Signed)
Discharge instructions given. Pt verbalized understanding

## 2013-04-19 NOTE — Progress Notes (Signed)
Family Medicine Teaching Service Daily Progress Note Intern Pager: [phone]  Patient name: Phillip [name] Medical record number: [id number] Date of birth: 1950/07/09 Age: 62 y.o. Gender: male  Primary Care Provider: Lucilla Edin, MD Consultants: Neuro Code Status: Full  Pt Overview and Major Events to Date:   Assessment and Plan: Phillip [name] is a 62 y.o. male presenting with acute occipital infarct. PMH is significant for diabetes (well controlled), hypertension and gout.   # Stroke, Acute - Presenting symptom was visual field deficit. Located in left occipital cortex and subcortical area, 1.5 cm; presented too late for Code Stroke; has long standing hx of migraine with/w/o aura. Has hx of several unremarkable TTEs. Question of whether this could be migrainous? (am unaware of relationship btw migraine and persistent mri findings)  - Plavix at 75 mg daily with daily aspirin for 30 days, followed by daily plavix  - Encourage starting statin, pt previously intolerant due to myalgias, although LDL 48 currently, would not treat to goal - Standard work up with MRA, carotid doppler, echo, lipid panel, TSH, A1C  - Allow permissive hypertension to 220/110 in acute stroke setting  - neuro stroke team consulting, appreciate recs - TEE today, LE dopplers if PFO found, if negative will get loop recorder to evaluate for afib  # HTN - BP 110-120s this morning - Hold home arb, HCTZ, and beta blocker   # Diabetes - Last A1C 6.6  - Check A1C in AM  - Hold metformin and onlgyza in inpatient setting, substitute moderate sliding scale novolog   # Obstructive Sleep Apnea  - Cont CPAP   # Gout  - Cont home allopurinol   # Osteoarthritis - S/p left hip replacement and partial left knee  - Avoid NSAIDs given pro-thrombotic characters   FEN/GI: DD, SLIV PPx: lovenox  Disposition: home after completion of stroke w/up, this pm  Subjective: No new complaints, NAEON  Objective: Temp:  [97.6 F  (36.4 C)-98.8 F (37.1 C)] 97.6 F (36.4 C) (12/19 0500) Pulse Rate:  [54-78] 59 (12/19 0500) Resp:  [18-20] 20 (12/19 0500) BP: (133-154)/(76-104) 141/88 mmHg (12/19 0500) SpO2:  [94 %-100 %] 94 % (12/19 0500) Physical Exam: General: NAD, well appearing, sitting up in chair Cardiovascular: RRR, no m/r/g Respiratory: CTAB, no wheezing Abdomen: soft, nt/nd Extremities: WWP, trace edema in left leg (chronic per pt report) Neuro: CNII-XII intact apart from some deficits with visual field testing by confrontation in right field, sensation equal and symmetric, strength 5/5 in bilat hand grip and hip flexion  Laboratory:  Recent Labs Lab 04/17/13 1240 04/17/13 1256 04/17/13 2135  WBC 5.6  --  6.8  HGB 13.2 14.6 13.6  HCT 39.6 43.0 40.3  PLT 325  --  334    Recent Labs Lab 04/17/13 1240 04/17/13 1256 04/17/13 2135  NA 136 140  --   K 4.0 4.0  --   CL 101 102  --   CO2 25  --   --   BUN 25* 25*  --   CREATININE 1.12 1.20 1.13  CALCIUM 10.1  --   --   PROT 7.1  --   --   BILITOT 0.2*  --   --   ALKPHOS 44  --   --   ALT 17  --   --   AST 14  --   --   GLUCOSE 124* 121*  --    TSH 2.534 A1C 7.7  Imaging/Diagnostic Tests: Mr Brain Ilda Basset  Contrast: 1.5 cm acute infarction in the left occipital cortical and subcortical brain with an adjacent 3 mm infarction. Findings are consistent with embolic infarction. No hemorrhage or mass effect.   MRA brain: Negative except for arterial dolichoectasia, most pronounced at the left ICA.  Carotid dopplers: Bilateral: 1-39% ICA stenosis lowest end of scale. Vertebral artery flow is antegrade.  TEE: Normal LV size and systolic function, EF 55%. Normal RV size and systolic function. Normal atrial sizes. No significant valvular abnormalities. No LAA thrombus. Mild plaque in descending thoracic aorta. Bubble study negative for PFO.    Beverely Low, MD 04/19/2013, 7:28 AM PGY-1, [facility] Family Medicine FPTS Intern pager: [phone], text  pages welcome

## 2013-04-19 NOTE — Progress Notes (Signed)
  Echocardiogram Echocardiogram Transesophageal has been performed.  Phillip [name] 04/19/2013, 3:03 PM

## 2013-04-19 NOTE — Interval H&P Note (Signed)
History and Physical Interval Note:  04/19/2013 12:28 PM  Phillip [name]  has presented today for surgery, with the diagnosis of Syncope  The various methods of treatment have been discussed with the patient and family. After consideration of risks, benefits and other options for treatment, the patient has consented to  Procedure(s): LOOP RECORDER IMPLANT (N/A) as a surgical intervention .  The patient's history has been reviewed, patient examined, no change in status, stable for surgery.  I have reviewed the patient's chart and labs.  Questions were answered to the patient's satisfaction.     Lewayne Bunting ,M.D.

## 2013-04-19 NOTE — Progress Notes (Signed)
OT NOTE  Spoke directly with PT Phillip [name] and patient is near baseline for mobility and function. Pt working on computer and recognizes some visual changes. Pt with resolving visual changes and completing compensatory strategies at this time.  OT to sign off acutely   [name]   OTR/L Pager: [phone] Office: [phone] .

## 2013-04-19 NOTE — Interval H&P Note (Signed)
History and Physical Interval Note:  04/19/2013 10:47 AM  Phillip [name]  has presented today for surgery, with the diagnosis of STROKE  The various methods of treatment have been discussed with the patient and family. After consideration of risks, benefits and other options for treatment, the patient has consented to  Procedure(s): TRANSESOPHAGEAL ECHOCARDIOGRAM (TEE) (N/A) as a surgical intervention .  The patient's history has been reviewed, patient examined, no change in status, stable for surgery.  I have reviewed the patient's chart and labs.  Questions were answered to the patient's satisfaction.     Oveda Dadamo [employer]

## 2013-04-19 NOTE — Progress Notes (Signed)
Stroke Team Progress Note  HISTORY Phillip [name] is an 62 y.o. 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  He stated that for the past 5 years he had been getting mainly visual auras, rather sporadically, but yesterday 04/16/2013 after 3 pm he was in his office seeing a patient and suddenly noticed that he was not able to see half of the face of his patient, then everything started going " squiggly and whiteness in my visual fields". Dr. Alwyn Ren indicated that this was subsequently followed by a left sided HA mainly around the periorbital region, but the HA subsided and the visual disturbances lasted for several hours which is quite unusual. Of importance, he tells me that he took Relpax at the time of this migraine attack. Decided to go to bed but when he woke up this morning 04/17/2013 he was still having trouble with his vision and then was asked by his colleague to have a brain MRI and evaluation by opthalmology. 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 MRI brain revealed an acute 1.5 cm area of acute infarction left occipital lobe. Takes aspirin 81 mg daily.  At this moment, he complains of " a tiny impairment of my central vision".  Patient was not a TPA candidate secondary to delay in arrival. He was admitted for further evaluation and treatment.  SUBJECTIVE No family members present. The patient was seen in endoscopy awaiting TEE. He feels he is improving. He still has some mild reading difficulties. Dr. Pearlean Brownie discussed the location and significance of the patient's stroke as well as the possible  risks of further strokes in the future.  OBJECTIVE Most recent Vital Signs: Filed Vitals:   04/19/13 1055 04/19/13 1100 04/19/13 1109 04/19/13 1110  BP: 146/73 135/82  144/88  Pulse: 55 54 57 58  Temp:    97.8 F (36.6 C)  TempSrc:    Oral  Resp: 12 14 16 12   Height:      Weight:      SpO2: 96% 97% 93% 96%   CBG (last 3)   Recent Labs  04/18/13 1640 04/18/13 2211 04/19/13 0647  GLUCAP 183* 172* 150*    IV Fluid Intake:   . sodium chloride 20 mL/hr at 04/19/13 0908    MEDICATIONS  . allopurinol  300 mg Oral Daily  . aspirin EC  81 mg Oral Daily  . chlorhexidine  60 mL Topical Once  . clopidogrel  75 mg Oral Q breakfast  . enoxaparin (LOVENOX) injection  40 mg Subcutaneous Q24H  . hydrochlorothiazide  25 mg Oral Daily  . insulin aspart  0-15 Units Subcutaneous TID WC  . losartan  100 mg Oral Daily  . metoprolol succinate  50 mg Oral Daily  . sodium chloride  3 mL Intravenous Q12H   PRN:  sodium chloride, acetaminophen, hydrALAZINE, senna-docusate, sodium chloride, zolpidem  Diet:  NPO thin liquids Activity:  As tolerated DVT Prophylaxis:  Lovenox 40 mg sq daily   CLINICALLY SIGNIFICANT STUDIES Basic Metabolic Panel:   Recent Labs Lab 04/17/13 1240 04/17/13 1256 04/17/13 2135  NA 136 140  --   K 4.0 4.0  --   CL 101 102  --  CO2 25  --   --   GLUCOSE 124* 121*  --   BUN 25* 25*  --   CREATININE 1.12 1.20 1.13  CALCIUM 10.1  --   --    Liver Function Tests:   Recent Labs Lab 04/17/13 1240  AST 14  ALT 17  ALKPHOS 44  BILITOT 0.2*  PROT 7.1  ALBUMIN 4.4   CBC:   Recent Labs Lab 04/17/13 1240 04/17/13 1256 04/17/13 2135  WBC 5.6  --  6.8  NEUTROABS 3.7  --   --   HGB 13.2 14.6 13.6  HCT 39.6 43.0 40.3  MCV 88.6  --  89.0  PLT 325  --  334   Coagulation:   Recent Labs Lab 04/17/13 1240  LABPROT 12.2  INR 0.92   Cardiac Enzymes:   Recent Labs Lab 04/17/13 1240  TROPONINI <0.30   Urinalysis:   Recent Labs Lab  04/17/13 1306  COLORURINE YELLOW  LABSPEC 1.014  PHURINE 6.5  GLUCOSEU NEGATIVE  HGBUR NEGATIVE  BILIRUBINUR NEGATIVE  KETONESUR NEGATIVE  PROTEINUR NEGATIVE  UROBILINOGEN 1.0  NITRITE NEGATIVE  LEUKOCYTESUR NEGATIVE   Lipid Panel    Component Value Date/Time   CHOL 121 04/18/2013 0533   TRIG 179* 04/18/2013 0533   HDL 37* 04/18/2013 0533   CHOLHDL 3.3 04/18/2013 0533   VLDL 36 04/18/2013 0533   LDLCALC 48 04/18/2013 0533   HgbA1C  Lab Results  Component Value Date   HGBA1C 7.7* 04/18/2013    Urine Drug Screen:     Component Value Date/Time   LABOPIA NONE DETECTED 04/17/2013 1306   COCAINSCRNUR NONE DETECTED 04/17/2013 1306   LABBENZ NONE DETECTED 04/17/2013 1306   AMPHETMU NONE DETECTED 04/17/2013 1306   THCU NONE DETECTED 04/17/2013 1306   LABBARB NONE DETECTED 04/17/2013 1306    Alcohol Level:   Recent Labs Lab 04/17/13 1240  ETH <11    CT of the brain  04/18/2013    Old granulomatous disease.  Minimal left basilar atelectasis.   MRI of the brain  04/17/2013   1.5 cm acute infarction in the left occipital cortical and subcortical brain with an adjacent 3 mm infarction. Findings are consistent with embolic infarction. No hemorrhage or mass effect.   MRA of the brain  - Negative except for arterial dolichoectasia, most pronounced at the left ICA.  2D Echocardiogram  - canceled - TEE ordered instead. Patient had a 2-D echo in July 2014.  TEE 04/19/2013- results pending  Carotid Doppler  Mild technical difficulty in the right mid to distal ICA due to tortuosity - Bilateral - 1% to 39% ICA stenosis lowest end of scale. Vertebral artery flow is antegrade.   CXR  Old granulomatous disease. Minimal left basilar atelectasis.   EKG  normal sinus rhythm, IVCD, consider atypical RBBB, old inferior infarct.   Therapy Recommendations - no followup therapies.  Physical Exam   Pleasant middle aged Caucasian male not in distress.Awake alert. Afebrile. Head is  nontraumatic. Neck is supple without bruit. Hearing is normal. Cardiac exam no murmur or gallop. Lungs are clear to auscultation. Distal pulses are well felt. Neurological Exam ;  Awake  Alert oriented x 3. Normal speech and language.eye movements full without nystagmus.fundi were not visualized. Vision acuity and fields appear normal. Hearing is normal. Palatal movements are normal. Face symmetric. Tongue midline. Normal strength, tone, reflexes and coordination. Normal sensation. Gait deferred.  ASSESSMENT [name] is a 62 y.o. male presenting with acute onset HA  and visual disturbances. Headache was atypical with visual disturbances not resolving. Imaging confirms a left occipital infarct. Infarct felt to be embolic secondary to  unknown etiology.  It could be associated with migraine, however, rare to see a migrainous infarct, but it has been reported in migraines with extended aura. On aspirin 81 mg orally every day prior to admission. Now on clopidogrel 75 mg orally every day and aspirin 81 mg daily for secondary stroke prevention. Patient with resultant right homonymous hemianopsia, do not expect long-term vision loss given stroke location. Work up underway.  Hypertension Hyperlipidemia, LDL 48, on no statin PTA, now on no statin, at goal LDL < 70 for diabetics Diabetes, HgbA1c 6.04 Feb 2013, at goal < 7.0 OSA, uses CPAP at home Hx migraines with aura Family history of stoke (grandmother, mother, aunt, uncle)  Hospital day # 2  TREATMENT/PLAN  Continue clopidogrel 75 mg orally every day for secondary stroke prevention. Also on aspirin 81 mg daily. Plan to D/C.  Complete stroke workup:  TEE pending  Therapy evals - no followup therapy recommended. TEE to look for embolic source. Arranged with [facility] for today.  If positive for PFO (patent foramen ovale), check bilateral lower extremity venous dopplers to rule out DVT as possible source of  stroke. If TEE negative, a [facility] electrophysiologist will consult and place implantable loop recorder to evaluate for atrial fibrillation as etiology of stroke. This has been explained to patient/family by Dr. Pearlean Brownie and they are agreeable.   2D was not performed as one done July 2014.  Delton See PA-C [facility] Pager [phone] 04/19/2013, 11:10 AM  I have personally obtained a history, examined the patient, evaluated imaging results, and formulated the assessment and plan of care. I agree with the above. Delia Heady, MD

## 2013-04-19 NOTE — Consult Note (Signed)
 ELECTROPHYSIOLOGY CONSULT NOTE  Patient ID: Phillip [name] MRN: [id number], DOB/AGE: 07/02/1950   Admit date: 04/17/2013 Date of Consult: 04/19/2013  Primary Physician: Steve Daub, MD Primary Cardiologist: Spencer Tilley, MD Reason for Consultation: Cryptogenic stroke; recommendations regarding Implantable Loop Recorder  History of Present Illness Dr. Solorio is a 62 year old man with nonob CAD s/p cath May 2009, normal LV function, NSVT / freq PVCs, HTN, DM and OSA was admitted on 04/17/2013 with acute left occipital CVA. He has been monitored on telemetry which has demonstrated no arrhythmias. No cause has been identified. Inpatient stroke work-up is to be completed with a TEE. EP has been asked to evaluate for placement of an implantable loop recorder to monitor for atrial fibrillation.  Past Medical History Past Medical History  Diagnosis Date  . Anemia   . Arthritis   . Diabetes mellitus   . Hyperlipidemia   . Hypertension   . Sleep apnea     cpap  . Gout   . Osteoarthritis of left hip 11/20/2012  . Localized osteoarthritis of left knee 11/20/2012  . Neuromuscular disorder     " mild periferal neauropathy  . PVC (premature ventricular contraction)   . Migraine headache with aura     Past Surgical History Past Surgical History  Procedure Laterality Date  . Cholecystectomy  1997  . Knee arthoscopy  2006    Left Knee  . Tonsillectomy and adenoidectomy      Childhood  . Total hip arthroplasty Left 11/20/2012    Dr Landau  . Replacement unicondylar joint knee Left 11/20/2012    Dr Landau  . Partial knee arthroplasty Left 11/20/2012    Procedure: UNICOMPARTMENTAL KNEE;  Surgeon: Joshua P Landau, MD;  Location: [facility] OR;  Service: Orthopedics;  Laterality: Left;  . Total hip arthroplasty Left 11/20/2012    Procedure: TOTAL HIP ARTHROPLASTY;  Surgeon: Joshua P Landau, MD;  Location: [facility] OR;  Service: Orthopedics;  Laterality: Left;    Allergies/Intolerances Allergies    Allergen Reactions  . Sulfa Antibiotics Other (See Comments)    Unknown reaction    Inpatient Medications . allopurinol  300 mg Oral Daily  . aspirin EC  81 mg Oral Daily  . clopidogrel  75 mg Oral Q breakfast  . enoxaparin (LOVENOX) injection  40 mg Subcutaneous Q24H  . hydrochlorothiazide  25 mg Oral Daily  . insulin aspart  0-15 Units Subcutaneous TID WC  . losartan  100 mg Oral Daily  . metoprolol succinate  50 mg Oral Daily  . sodium chloride  3 mL Intravenous Q12H    Social History History   Social History  . Marital Status: Married    Spouse Name: N/A    Number of Children: N/A  . Years of Education: N/A   Occupational History  . Not on file.   Social History Main Topics  . Smoking status: Never Smoker   . Smokeless tobacco: Never Used  . Alcohol Use: No  . Drug Use: No  . Sexual Activity: Not on file   Other Topics Concern  . Not on file   Social History Narrative  . No narrative on file    Review of Systems General: No chills, fever, night sweats or weight changes  Cardiovascular:  No chest pain, dyspnea on exertion, edema, orthopnea, palpitations, paroxysmal nocturnal dyspnea Dermatological: No rash, lesions or masses Respiratory: No cough, dyspnea Urologic: No hematuria, dysuria Abdominal: No nausea, vomiting, diarrhea, bright red blood per rectum, melena, or hematemesis   Neurologic: No visual changes, weakness, changes in mental status All other systems reviewed and are otherwise negative except as noted above.  Physical Exam Blood pressure 141/88, pulse 59, temperature 97.6 F (36.4 C), temperature source Oral, resp. rate 20, height 6' 1" (1.854 m), weight 234 lb 11.2 oz (106.459 kg), SpO2 94.00%.  General: Well developed, well appearing 62 y.o. male in no acute distress. HEENT: Normocephalic, atraumatic. EOMs intact. Sclera nonicteric. Oropharynx clear.  Neck: Supple. No JVD. Lungs: Respirations regular and unlabored, CTA bilaterally. No  wheezes, rales or rhonchi. Heart: RRR. S1, S2 present. No murmurs, rub, S3 or S4. Abdomen: Soft, non-distended.  Extremities: No clubbing, cyanosis or edema. DP/PT/Radials 2+ and equal bilaterally. Psych: Normal affect. Neuro: Alert and oriented X 3. Moves all extremities spontaneously. Musculoskeletal: No kyphosis. Skin: Intact. Warm and dry. No rashes or petechiae in exposed areas.   Labs Lab Results  Component Value Date   WBC 6.8 04/17/2013   HGB 13.6 04/17/2013   HCT 40.3 04/17/2013   MCV 89.0 04/17/2013   PLT 334 04/17/2013    Recent Labs Lab 04/17/13 1240 04/17/13 1256 04/17/13 2135  NA 136 140  --   K 4.0 4.0  --   CL 101 102  --   CO2 25  --   --   BUN 25* 25*  --   CREATININE 1.12 1.20 1.13  CALCIUM 10.1  --   --   PROT 7.1  --   --   BILITOT 0.2*  --   --   ALKPHOS 44  --   --   ALT 17  --   --   AST 14  --   --   GLUCOSE 124* 121*  --     Recent Labs  04/17/13 1240  INR 0.92    Radiology/Studies Dg Chest 2 View 04/18/2013   FINDINGS: Upper normal heart size.  Normal mediastinal contours and pulmonary vascularity.  Calcified granuloma right upper lobe with tiny calcified lymph nodes at AP window.  Minimal atelectasis left base.  Lungs otherwise clear.  No pleural effusion or pneumothorax.  No acute osseous findings.  IMPRESSION: Old granulomatous disease.  Minimal left basilar atelectasis.   Electronically Signed   By: Mark  Boles M.D.   On: 04/18/2013 01:38   Mr Brain Wo Contrast 04/17/2013    IMPRESSION: 1.5 cm acute infarction in the left occipital cortical and subcortical brain with an adjacent 3 mm infarction. Findings are consistent with embolic infarction. No hemorrhage or mass effect.   Electronically Signed   By: Mark  Shogry M.D.   On: 04/17/2013 11:36   Mr Mra Head/brain Wo Cm 04/18/2013   IMPRESSION: Negative except for arterial dolichoectasia, most pronounced at the left ICA.    Electronically Signed   By: Lee  Hall M.D.   On: 04/18/2013  11:48   Echocardiogram  Mild concentric LVH. Normal LVEF, 60%. Grade 1 diastolic dysfunction. Mild LAE (3.9 cm) with mild aneurysmal atrial septal motion.  12-lead ECG on admission - SR with IVCD; QRS 125 msec Telemetry reviewed - SR with occasional PVCs  Assessment and Plan 1. Cryptogenic stroke 2. Nonob CAD s/p cath 2009 3. NSVT / frequent PVCs 4. Normal LV function 5. OSA  If the TEE is negative, we recommend loop recorder insertion to monitor for AF. The indication for loop recorder insertion / monitoring for AF in setting of cryptogenic stroke was discussed with Dr. Emry. The loop recorder insertion procedure was reviewed in detail including risks   and benefits. These risks include but are not limited to bleeding and infection. Dr. Schiffman expressed verbal understanding and agrees to proceed. He was also counseled regarding wound care and device follow-up.  Dr. Emilie Carp to see Signed, EDMISTEN, BROOKE, PA-C 04/19/2013, 8:11 AM    EP Attending  Patient seen and examined. Agree with above exam, assessment and plan. For ILR insertion today.  Shamara Soza,M.D. 

## 2013-04-19 NOTE — H&P (View-Only) (Signed)
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  He stated that for the past 5 years he had been getting mainly visual auras, rather sporadically, but yesterday 04/16/2013 after 3 pm he was in his office seeing a patient and suddenly noticed that he was not able to see half of the face of his patient, then everything started going " squiggly and whiteness in my visual fields". Dr. Wurzel indicated that this was subsequently followed by a left sided HA mainly around the periorbital region, but the HA subsided and the visual disturbances lasted for several hours which is quite unusual. Of importance, he tells me that he took Relpax at the time of this migraine attack. Decided to go to bed but when he woke up this morning 04/17/2013 he was still having trouble with his vision and then was asked by his colleague to have a brain MRI and evaluation by opthalmology. Dr. Hooper denies associated vertigo, double vision, difficulty swallowing, focal weakness or numbness, unsteadiness, slurred speech, confusion, or language impairment. MRI brain revealed an acute 1.5 cm area of acute infarction left occipital lobe. Takes aspirin 81 mg daily.  At this moment, he complains of " a tiny impairment of my central vision".  Patient was not a TPA candidate secondary to delay in arrival. He was admitted for further evaluation and treatment.  SUBJECTIVE His wife is at the bedside (she is/was a PICU RN).  Overall he feels his condition is gradually improving, but not resolved.   OBJECTIVE Most recent Vital Signs: Filed Vitals:   04/18/13 0132 04/18/13 0401 04/18/13 0538 04/18/13 0927   BP: 130/74 115/73 115/74 146/97  Pulse: 59 60 55 78  Temp:  97.8 F (36.6 C) 97.4 F (36.3 C) 98 F (36.7 C)  TempSrc:  Oral Oral Oral  Resp: 16 18 18 20  Height:      Weight:      SpO2: 95% 99% 100% 96%   CBG (last 3)   Recent Labs  04/17/13 1336 04/17/13 2234 04/18/13 0647  GLUCAP 126* 185* 144*    IV Fluid Intake:     MEDICATIONS  . allopurinol  300 mg Oral Daily  . aspirin EC  81 mg Oral Daily  . clopidogrel  75 mg Oral Q breakfast  . enoxaparin (LOVENOX) injection  40 mg Subcutaneous Q24H  . insulin aspart  0-15 Units Subcutaneous TID WC  . sodium chloride  3 mL Intravenous Q12H   PRN:  sodium chloride, acetaminophen, senna-docusate, sodium chloride, zolpidem  Diet:  Carb Control thin liquids Activity:  As tolerated DVT Prophylaxis:  Lovenox 40 mg sq daily   CLINICALLY SIGNIFICANT STUDIES Basic Metabolic Panel:   Recent Labs Lab 04/17/13 1240 04/17/13 1256 04/17/13 2135  NA 136 140  --   K 4.0 4.0  --   CL 101 102  --   CO2 25  --   --   GLUCOSE 124* 121*  --   BUN 25* 25*  --   CREATININE 1.12 1.20 1.13  CALCIUM 10.1  --   --    Liver Function Tests:   Recent Labs Lab 04/17/13 1240  AST 14  ALT 17  ALKPHOS   44  BILITOT 0.2*  PROT 7.1  ALBUMIN 4.4   CBC:   Recent Labs Lab 04/17/13 1240 04/17/13 1256 04/17/13 2135  WBC 5.6  --  6.8  NEUTROABS 3.7  --   --   HGB 13.2 14.6 13.6  HCT 39.6 43.0 40.3  MCV 88.6  --  89.0  PLT 325  --  334   Coagulation:   Recent Labs Lab 04/17/13 1240  LABPROT 12.2  INR 0.92   Cardiac Enzymes:   Recent Labs Lab 04/17/13 1240  TROPONINI <0.30   Urinalysis:   Recent Labs Lab 04/17/13 1306  COLORURINE YELLOW  LABSPEC 1.014  PHURINE 6.5  GLUCOSEU NEGATIVE  HGBUR NEGATIVE  BILIRUBINUR NEGATIVE  KETONESUR NEGATIVE  PROTEINUR NEGATIVE  UROBILINOGEN 1.0  NITRITE NEGATIVE  LEUKOCYTESUR NEGATIVE   Lipid Panel    Component Value Date/Time   CHOL 121 04/18/2013 0533   TRIG 179*  04/18/2013 0533   HDL 37* 04/18/2013 0533   CHOLHDL 3.3 04/18/2013 0533   VLDL 36 04/18/2013 0533   LDLCALC 48 04/18/2013 0533   HgbA1C  Lab Results  Component Value Date   HGBA1C 6.6 02/02/2013    Urine Drug Screen:     Component Value Date/Time   LABOPIA NONE DETECTED 04/17/2013 1306   COCAINSCRNUR NONE DETECTED 04/17/2013 1306   LABBENZ NONE DETECTED 04/17/2013 1306   AMPHETMU NONE DETECTED 04/17/2013 1306   THCU NONE DETECTED 04/17/2013 1306   LABBARB NONE DETECTED 04/17/2013 1306    Alcohol Level:   Recent Labs Lab 04/17/13 1240  ETH <11    CT of the brain  04/18/2013    Old granulomatous disease.  Minimal left basilar atelectasis.   MRI of the brain  04/17/2013   1.5 cm acute infarction in the left occipital cortical and subcortical brain with an adjacent 3 mm infarction. Findings are consistent with embolic infarction. No hemorrhage or mass effect.   MRA of the brain    2D Echocardiogram    Carotid Doppler    CXR    EKG  normal sinus rhythm, IVCD, consider atypical RBBB, old inferior infarct.   Therapy Recommendations   Physical Exam   Pleasant middle aged Caucasian male not in distress.Awake alert. Afebrile. Head is nontraumatic. Neck is supple without bruit. Hearing is normal. Cardiac exam no murmur or gallop. Lungs are clear to auscultation. Distal pulses are well felt. Neurological Exam ;  Awake  Alert oriented x 3. Normal speech and language.eye movements full without nystagmus.fundi were not visualized. Vision acuity and fields appear normal. Hearing is normal. Palatal movements are normal. Face symmetric. Tongue midline. Normal strength, tone, reflexes and coordination. Normal sensation. Gait deferred. ASSESSMENT [name] is a 62 y.o. male presenting with acute onset HA and visual disturbances. Headache was atypical with visual disturbances not resolving. Imaging confirms a left occipital infarct. Infarct felt to be embolic secondary to   unknown etiology.  It could be associated with migraine, however, rare to see a migrainous infarct, but it has been reported in migraines with extended aura. On aspirin 81 mg orally every day prior to admission. Now on clopidogrel 75 mg orally every day for secondary stroke prevention. Patient with resultant right homonymous hemianopsia, do not expect long-term vision loss given stroke location. Work up underway.  Hypertension Hyperlipidemia, LDL 48, on no statin PTA, now on no statin, at goal LDL < 70 for diabetics Diabetes, HgbA1c 6.04 Feb 2013, at goal < 7.0 OSA, uses CPAP at home Hx   migraines with aura Family history of stoke (grandmother, mother, aunt, uncle)  Hospital day # 1  TREATMENT/PLAN  Continue clopidogrel 75 mg orally every day for secondary stroke prevention.  Complete stroke workup:  MRA, 2D, carotid doppler  Therapy evals TEE to look for embolic source. Arranged with [facility] for tomorrow.  If positive for PFO (patent foramen ovale), check bilateral lower extremity venous dopplers to rule out DVT as possible source of stroke. (I have made patient NPO after midnight tonight). If TEE negative, a [facility] electrophysiologist will consult and place implantable loop recorder to evaluate for atrial fibrillation as etiology of stroke. This has been explained to patient/family by Dr. Lucita Montoya and they are agreeable.  Will discontinue 2D as one done July 2014.  SHARON BIBY, MSN, RN, ANVP-BC, ANP-BC, GNP-BC [facility] Pager: [phone] 04/18/2013 10:21 AM  I have personally obtained a history, examined the patient, evaluated imaging results, and formulated the assessment and plan of care. I agree with the above. Hagop Mccollam, MD  

## 2013-04-19 NOTE — CV Procedure (Signed)
ILR insertion via the left pectoral region without immediate complication. [id number].

## 2013-04-19 NOTE — Discharge Summary (Signed)
Family Medicine Teaching [facility] Discharge Summary  Patient name: Phillip [name] Medical record number: [id number] Date of birth: [birth year] Age: 62 y.o. Gender: male Date of Admission: 04/17/2013  Date of Discharge: 04/19/2013  Admitting Physician: Barbaraann Barthel, MD  Primary Care Provider: Lucilla Edin, MD Consultants: neuro  Indication for Hospitalization: occipital stroke  Discharge Diagnoses/Problem List:  Patient Active Problem List   Diagnosis Date Noted  . Acute embolic stroke 04/17/2013  . Occipital stroke 04/17/2013  . Osteoarthritis of left hip 11/20/2012  . Localized osteoarthritis of left knee 11/20/2012  . ED (erectile dysfunction) 02/01/2012  . Gout 09/05/2011  . Hyperlipidemia 09/05/2011  . Hypertriglyceridemia 09/05/2011  . Arthritis 09/05/2011  . PVC (premature ventricular contraction) 09/05/2011  . Type II or unspecified type diabetes mellitus without mention of complication, not stated as uncontrolled 06/01/2011  . Essential hypertension, benign 06/01/2011  . OSA (obstructive sleep apnea) 06/01/2011    Disposition: home  Discharge Condition: stable  Discharge Exam:  General: NAD, well appearing, sitting up in chair  Cardiovascular: RRR, no m/r/g  Respiratory: CTAB, no wheezing  Abdomen: soft, nt/nd  Extremities: WWP, trace edema in left leg (chronic per pt report)  Neuro: CNII-XII intact apart from some deficits with visual field testing by confrontation in right field, sensation equal and symmetric, strength 5/5 in bilat hand grip and hip flexion  Brief Hospital Course: Pt was admitted with a new occipital infarct and a visual field defect and no other deficits. Carotid doppler and echo were normal. PT/OT evaluations recommended no further rehab. An implantable loop recorder was placed to evaluate for atrial fibrillation. He was taken off aspirin and placed on plavix per neurology recomendations  Issues for Follow Up:  # Patient resistant to  statin given low LDL, would encourage this for repeat stroke prevention and plaque stabilization regardless of lipid measurements.  Significant Procedures: implantation of loop recorder  Significant Labs and Imaging:   Recent Labs Lab 04/17/13 1240 04/17/13 1256 04/17/13 2135 04/19/13 1415  WBC 5.6  --  6.8 5.1  HGB 13.2 14.6 13.6 11.9*  HCT 39.6 43.0 40.3 34.7*  PLT 325  --  334 304    Recent Labs Lab 04/17/13 1240 04/17/13 1256 04/17/13 2135 04/19/13 1415  NA 136 140  --   --   K 4.0 4.0  --   --   CL 101 102  --   --   CO2 25  --   --   --   GLUCOSE 124* 121*  --   --   BUN 25* 25*  --   --   CREATININE 1.12 1.20 1.13 0.91  CALCIUM 10.1  --   --   --   ALKPHOS 44  --   --   --   AST 14  --   --   --   ALT 17  --   --   --   ALBUMIN 4.4  --   --   --    Mr Brain Wo Contrast: 1.5 cm acute infarction in the left occipital cortical and subcortical brain with an adjacent 3 mm infarction. Findings are consistent with embolic infarction. No hemorrhage or mass effect.   MRA brain: Negative except for arterial dolichoectasia, most pronounced at the left ICA.   Carotid dopplers: Bilateral: 1-39% ICA stenosis lowest end of scale. Vertebral artery flow is antegrade.   TEE: Normal LV size and systolic function, EF 55%. Normal RV size and systolic function. Normal  atrial sizes. No significant valvular abnormalities. No LAA thrombus. Mild plaque in descending thoracic aorta. Bubble study negative for PFO.   Results/Tests Pending at Time of Discharge: none  Discharge Medications:    Medication List    STOP taking these medications       aspirin 81 MG tablet      TAKE these medications       acetaminophen 650 MG CR tablet  Commonly known as:  TYLENOL  Take 1,300 mg by mouth every 8 (eight) hours as needed for pain.     allopurinol 300 MG tablet  Commonly known as:  ZYLOPRIM  Take 1 tablet (300 mg total) by mouth daily.     atorvastatin 40 MG tablet  Commonly known  as:  LIPITOR  Take 1 tablet (40 mg total) by mouth daily.     clopidogrel 75 MG tablet  Commonly known as:  PLAVIX  Take 1 tablet (75 mg total) by mouth daily with breakfast.     losartan-hydrochlorothiazide 100-25 MG per tablet  Commonly known as:  HYZAAR  Take 1 tablet by mouth daily.     metaxalone 800 MG tablet  Commonly known as:  SKELAXIN  Take 1 tablet (800 mg total) by mouth every 6 (six) hours as needed.     metFORMIN 1000 MG (MOD) 24 hr tablet  Commonly known as:  GLUMETZA  Take 1,000 mg by mouth 2 (two) times daily with a meal.     metoprolol succinate 50 MG 24 hr tablet  Commonly known as:  TOPROL-XL  Take 50 mg by mouth daily. Take with or immediately following a meal.     naproxen sodium 220 MG tablet  Commonly known as:  ANAPROX  Take 440 mg by mouth daily as needed (pain).     saxagliptin HCl 5 MG Tabs tablet  Commonly known as:  ONGLYZA  Take 1 tablet (5 mg total) by mouth daily.     SM LANCETS 33G Misc  USE TO TEST BLOOD SUGAR TWICE DAILY     TRUETEST TEST test strip  Generic drug:  glucose blood  USE TO TEST TWICE DAILY     zolpidem 5 MG tablet  Commonly known as:  AMBIEN  Take 5 mg by mouth at bedtime as needed for sleep.        Discharge Instructions: Please refer to Patient Instructions section of EMR for full details.  Patient was counseled important signs and symptoms that should prompt return to medical care, changes in medications, dietary instructions, activity restrictions, and follow up appointments.   Follow-Up Appointments:     Follow-up Information   Follow up with DAUB, STEVE A, MD In 1 week.   Specialty:  Family Medicine   Contact information:   [address] [place] [address] [phone]       Follow up with [facility] On 04/30/2013. (At 10:00 AM for wound check)    Specialty:  Cardiology   Contact information:   [address], [address] [place] [address] [phone]      Beverely Low, MD 04/19/2013, 6:22 PM PGY-1, [facility]

## 2013-04-19 NOTE — Progress Notes (Signed)
FMTS Attending Note Patient's care discussed with resident team, patient off the floor at time of my intended visit.  I agree with Dr Cherre Huger assessment and plan as documented in this note.  Paula Compton, MD

## 2013-04-19 NOTE — Progress Notes (Signed)
Pt education on loop recorder  Pt is watching video # 128, [facility] patient monitor.

## 2013-04-19 NOTE — H&P (View-Only) (Signed)
ELECTROPHYSIOLOGY CONSULT NOTE  Patient ID: Phillip [name] MRN: [id number], DOB/AGE: 12-10-1950   Admit date: 04/17/2013 Date of Consult: 04/19/2013  Primary Physician: Earl Lites, MD Primary Cardiologist: Viann Fish, MD Reason for Consultation: Cryptogenic stroke; recommendations regarding Implantable Loop Recorder  History of Present Illness Dr. Munyon is a 62 year old man with nonob CAD s/p cath May 2009, normal LV function, NSVT / freq PVCs, HTN, DM and OSA was admitted on 04/17/2013 with acute left occipital CVA. He has been monitored on telemetry which has demonstrated no arrhythmias. No cause has been identified. Inpatient stroke work-up is to be completed with a TEE. EP has been asked to evaluate for placement of an implantable loop recorder to monitor for atrial fibrillation.  Past Medical History Past Medical History  Diagnosis Date  . Anemia   . Arthritis   . Diabetes mellitus   . Hyperlipidemia   . Hypertension   . Sleep apnea     cpap  . Gout   . Osteoarthritis of left hip 11/20/2012  . Localized osteoarthritis of left knee 11/20/2012  . Neuromuscular disorder     " mild periferal neauropathy  . PVC (premature ventricular contraction)   . Migraine headache with aura     Past Surgical History Past Surgical History  Procedure Laterality Date  . Cholecystectomy  1997  . Knee arthoscopy  2006    Left Knee  . Tonsillectomy and adenoidectomy      Childhood  . Total hip arthroplasty Left 11/20/2012    Dr Dion Saucier  . Replacement unicondylar joint knee Left 11/20/2012    Dr Dion Saucier  . Partial knee arthroplasty Left 11/20/2012    Procedure: UNICOMPARTMENTAL KNEE;  Surgeon: Eulas Post, MD;  [facility];  Service: Orthopedics;  Laterality: Left;  . Total hip arthroplasty Left 11/20/2012    Procedure: TOTAL HIP ARTHROPLASTY;  Surgeon: Eulas Post, MD;  Location: [facility] OR;  Service: Orthopedics;  Laterality: Left;    Allergies/Intolerances Allergies    Allergen Reactions  . Sulfa Antibiotics Other (See Comments)    Unknown reaction    Inpatient Medications . allopurinol  300 mg Oral Daily  . aspirin EC  81 mg Oral Daily  . clopidogrel  75 mg Oral Q breakfast  . enoxaparin (LOVENOX) injection  40 mg Subcutaneous Q24H  . hydrochlorothiazide  25 mg Oral Daily  . insulin aspart  0-15 Units Subcutaneous TID WC  . losartan  100 mg Oral Daily  . metoprolol succinate  50 mg Oral Daily  . sodium chloride  3 mL Intravenous Q12H    Social History History   Social History  . Marital Status: Married    Spouse Name: N/A    Number of Children: N/A  . Years of Education: N/A   Occupational History  . Not on file.   Social History Main Topics  . Smoking status: Never Smoker   . Smokeless tobacco: Never Used  . Alcohol Use: No  . Drug Use: No  . Sexual Activity: Not on file   Other Topics Concern  . Not on file   Social History Narrative  . No narrative on file    Review of Systems General: No chills, fever, night sweats or weight changes  Cardiovascular:  No chest pain, dyspnea on exertion, edema, orthopnea, palpitations, paroxysmal nocturnal dyspnea Dermatological: No rash, lesions or masses Respiratory: No cough, dyspnea Urologic: No hematuria, dysuria Abdominal: No nausea, vomiting, diarrhea, bright red blood per rectum, melena, or hematemesis  Neurologic: No visual changes, weakness, changes in mental status All other systems reviewed and are otherwise negative except as noted above.  Physical Exam Blood pressure 141/88, pulse 59, temperature 97.6 F (36.4 C), temperature source Oral, resp. rate 20, height 6\' 1"  (1.854 m), weight 234 lb 11.2 oz (106.459 kg), SpO2 94.00%.  General: Well developed, well appearing 62 y.o. male in no acute distress. HEENT: Normocephalic, atraumatic. EOMs intact. Sclera nonicteric. Oropharynx clear.  Neck: Supple. No JVD. Lungs: Respirations regular and unlabored, CTA bilaterally. No  wheezes, rales or rhonchi. Heart: RRR. S1, S2 present. No murmurs, rub, S3 or S4. Abdomen: Soft, non-distended.  Extremities: No clubbing, cyanosis or edema. DP/PT/Radials 2+ and equal bilaterally. Psych: Normal affect. Neuro: Alert and oriented X 3. Moves all extremities spontaneously. Musculoskeletal: No kyphosis. Skin: Intact. Warm and dry. No rashes or petechiae in exposed areas.   Labs Lab Results  Component Value Date   WBC 6.8 04/17/2013   HGB 13.6 04/17/2013   HCT 40.3 04/17/2013   MCV 89.0 04/17/2013   PLT 334 04/17/2013    Recent Labs Lab 04/17/13 1240 04/17/13 1256 04/17/13 2135  NA 136 140  --   K 4.0 4.0  --   CL 101 102  --   CO2 25  --   --   BUN 25* 25*  --   CREATININE 1.12 1.20 1.13  CALCIUM 10.1  --   --   PROT 7.1  --   --   BILITOT 0.2*  --   --   ALKPHOS 44  --   --   ALT 17  --   --   AST 14  --   --   GLUCOSE 124* 121*  --     Recent Labs  04/17/13 1240  INR 0.92    Radiology/Studies Dg Chest 2 View 04/18/2013   FINDINGS: Upper normal heart size.  Normal mediastinal contours and pulmonary vascularity.  Calcified granuloma right upper lobe with tiny calcified lymph nodes at AP window.  Minimal atelectasis left base.  Lungs otherwise clear.  No pleural effusion or pneumothorax.  No acute osseous findings.  IMPRESSION: Old granulomatous disease.  Minimal left basilar atelectasis.   Electronically Signed   By: Ulyses Southward M.D.   On: 04/18/2013 01:38   Mr Brain Wo Contrast 04/17/2013    IMPRESSION: 1.5 cm acute infarction in the left occipital cortical and subcortical brain with an adjacent 3 mm infarction. Findings are consistent with embolic infarction. No hemorrhage or mass effect.   Electronically Signed   By: Paulina Fusi M.D.   On: 04/17/2013 11:36   Mr Maxine Glenn Head/brain Wo Cm 04/18/2013   IMPRESSION: Negative except for arterial dolichoectasia, most pronounced at the left ICA.    Electronically Signed   By: Augusto Gamble M.D.   On: 04/18/2013  11:48   Echocardiogram  Mild concentric LVH. Normal LVEF, 60%. Grade 1 diastolic dysfunction. Mild LAE (3.9 cm) with mild aneurysmal atrial septal motion.  12-lead ECG on admission - SR with IVCD; QRS 125 msec Telemetry reviewed - SR with occasional PVCs  Assessment and Plan 1. Cryptogenic stroke 2. Nonob CAD s/p cath 2009 3. NSVT / frequent PVCs 4. Normal LV function 5. OSA  If the TEE is negative, we recommend loop recorder insertion to monitor for AF. The indication for loop recorder insertion / monitoring for AF in setting of cryptogenic stroke was discussed with Dr. Alwyn Ren. The loop recorder insertion procedure was reviewed in detail including risks  and benefits. These risks include but are not limited to bleeding and infection. Dr. Alwyn Ren expressed verbal understanding and agrees to proceed. He was also counseled regarding wound care and device follow-up.  Dr. Ladona Ridgel to see Signed, Rick Duff, PA-C 04/19/2013, 8:11 AM    EP Attending  Patient seen and examined. Agree with above exam, assessment and plan. For ILR insertion today.  Leonia Reeves.D.

## 2013-04-20 NOTE — Discharge Summary (Signed)
Discussed patient's care with resident team, reviewed chart/notes and I agree with plan for discharge as documented.  Paula Compton, MD

## 2013-04-20 NOTE — Op Note (Signed)
NAME:  Phillip [name], Phillip [name]                ACCOUNT NO.:  [id number]  MEDICAL RECORD NO.:  [id number]  LOCATION:  [id number]                        FACILITY:  [facility]  PHYSICIAN:  Doylene Canning. Ladona Ridgel, MD    DATE OF BIRTH:  11-29-1950  DATE OF PROCEDURE:  04/19/2013 DATE OF DISCHARGE:  04/19/2013                              OPERATIVE REPORT   PROCEDURE PERFORMED:  Insertion of a Medtronic implantable loop recorder.  INDICATION:  Cryptogenic stroke.  INTRODUCTION:  The patient is a 62 year old man who is admitted to the hospital with a stroke of unclear etiology.  Subsequent evaluation was unrevealing.  He is now referred for insertion of an implantable loop recorder.  PROCEDURE:  After informed was obtained, the patient was taken to the GI endoscopy laboratory in the fasting state.  After usual preparation and draping, 30 mL of lidocaine was infiltrated into the left pectoral region.  A stab incision was made and the Medtronic implantable loop recorder (link) was inserted subcutaneously under the skin.  Benzoin and Steri-Strips were painted on the skin and Steri-Strips were applied and pressure was held.  The loop recorder was evaluated and found to have satisfactory R-waves at 0.3.  A bandage was placed over the small incision and the patient was returned to his room in satisfactory condition.  COMPLICATIONS:  There were no immediate procedure complications.  RESULTS:  This demonstrates successful insertion of a Medtronic implantable loop recorder without immediate procedure complication.     Doylene Canning. Ladona Ridgel, MD     GWT/MEDQ  D:  04/19/2013  T:  04/20/2013  Job:  [id number]

## 2013-04-22 ENCOUNTER — Encounter (HOSPITAL_COMMUNITY): Payer: Self-pay | Admitting: Cardiology

## 2013-04-26 ENCOUNTER — Other Ambulatory Visit: Payer: Self-pay | Admitting: *Deleted

## 2013-04-26 MED ORDER — CLOPIDOGREL BISULFATE 75 MG PO TABS: 75.0000 mg | ORAL_TABLET | Freq: Every day | ORAL | Status: DC

## 2013-04-26 NOTE — Telephone Encounter (Signed)
Confirmed Rx with Dr. Dareen Piano.

## 2013-04-26 NOTE — Telephone Encounter (Signed)
Pt called asking for a 90 day supply generic Plavix to be sent to [facility].

## 2013-04-27 ENCOUNTER — Ambulatory Visit: Payer: 59

## 2013-04-27 ENCOUNTER — Ambulatory Visit (INDEPENDENT_AMBULATORY_CARE_PROVIDER_SITE_OTHER): Payer: 59 | Admitting: Family Medicine

## 2013-04-27 VITALS — BP 130/84 | HR 70 | Temp 98.0°F | Resp 14 | Ht 72.0 in | Wt 233.0 lb

## 2013-04-27 DIAGNOSIS — M79642 Pain in left hand: Secondary | ICD-10-CM

## 2013-04-27 DIAGNOSIS — M79609 Pain in unspecified limb: Secondary | ICD-10-CM

## 2013-04-27 NOTE — Progress Notes (Signed)
Subjective:    Patient ID: Phillip [name], male    DOB: May 11, 1950, 62 y.o.   MRN: [id number]   Chief Complaint  Patient presents with  . Hand Pain    HPI  Phillip [name] fell yesterday - he was walking on a curb and tripped onto his left foot which is weaker due to his recent left hip replacement so he was unable to catch and stabilize himself.  He fell onto the ulnar aspect of his left hand and has some bruising and abrasions over his 5th metacarpal base and 5th MCP w/ come continuing pain there.  He has normal ROM in the hand but it is still quite ttp.  Is still having tenderness over his left greater trochanter w/ a slight limp today - where he had recent hip replacement.  Past Medical History  Diagnosis Date  . Anemia   . Arthritis   . Diabetes mellitus   . Hyperlipidemia   . Hypertension   . Sleep apnea     cpap  . Gout   . Osteoarthritis of left hip 11/20/2012  . Localized osteoarthritis of left knee 11/20/2012  . Neuromuscular disorder     " mild periferal neauropathy  . PVC (premature ventricular contraction)   . Migraine headache with aura    Current Outpatient Prescriptions on File Prior to Visit  Medication Sig Dispense Refill  . acetaminophen (TYLENOL) 650 MG CR tablet Take 1,300 mg by mouth every 8 (eight) hours as needed for pain.      [name] allopurinol (ZYLOPRIM) 300 MG tablet Take 1 tablet (300 mg total) by mouth daily.  90 tablet  3  . atorvastatin (LIPITOR) 40 MG tablet Take 1 tablet (40 mg total) by mouth daily.  30 tablet  0  . clopidogrel (PLAVIX) 75 MG tablet Take 1 tablet (75 mg total) by mouth daily with breakfast.  90 tablet  3  . losartan-hydrochlorothiazide (HYZAAR) 100-25 MG per tablet Take 1 tablet by mouth daily.  90 tablet  3  . metaxalone (SKELAXIN) 800 MG tablet Take 1 tablet (800 mg total) by mouth every 6 (six) hours as needed.  60 tablet  1  . metFORMIN (GLUMETZA) 1000 MG (MOD) 24 hr tablet Take 1,000 mg by mouth 2 (two) times daily with a meal.      .  metoprolol succinate (TOPROL-XL) 50 MG 24 hr tablet Take 50 mg by mouth daily. Take with or immediately following a meal.      . naproxen sodium (ANAPROX) 220 MG tablet Take 440 mg by mouth daily as needed (pain).      . saxagliptin HCl (ONGLYZA) 5 MG TABS tablet Take 1 tablet (5 mg total) by mouth daily.  90 tablet  3  . SM LANCETS 33G MISC USE TO TEST BLOOD SUGAR TWICE DAILY  400 each  PRN  . TRUETEST TEST test strip USE TO TEST TWICE DAILY  400 each  PRN  . zolpidem (AMBIEN) 5 MG tablet Take 5 mg by mouth at bedtime as needed for sleep.       No current facility-administered medications on file prior to visit.   Allergies  Allergen Reactions  . Sulfa Antibiotics Other (See Comments)    Unknown reaction    Review of Systems  Constitutional: Negative for fever, chills, diaphoresis, activity change and appetite change.  Musculoskeletal: Positive for arthralgias. Negative for gait problem.  Skin: Positive for color change. Negative for rash.  Neurological: Negative for weakness and numbness.  Hematological:  Negative for adenopathy. Does not bruise/bleed easily.      BP 130/84  Pulse 70  Temp(Src) 98 F (36.7 C)  Resp 14  Ht 6' (1.829 m)  Wt 233 lb (105.688 kg)  BMI 31.59 kg/m2  SpO2 97% Objective:   Physical Exam  Constitutional: He is oriented to person, place, and time. He appears well-developed and well-nourished. No distress.  HENT:  Head: Normocephalic and atraumatic.  Eyes: No scleral icterus.  Pulmonary/Chest: Effort normal.  Musculoskeletal:       Left hand: He exhibits tenderness and bony tenderness. He exhibits normal range of motion, no deformity and no swelling.  ttp over 5th proximal and distal metacarpal with subtle bruising at both areas and small abrasions over MCP joint. Norm ROM.  Neurological: He is alert and oriented to person, place, and time.  Skin: Skin is warm and dry. He is not diaphoretic.  Psychiatric: He has a normal mood and affect. His behavior  is normal.      [facility] reading (PRIMARY) by  Dr. Clelia Croft.  Lt 5th metacarpal and hand xray: No acute abnormality, no fracture seen. Assessment & Plan:  Left hand pain - Plan: DG Hand Complete Left No fracture seen, watchful waiting with freq icing.

## 2013-04-30 ENCOUNTER — Ambulatory Visit: Payer: 59

## 2013-05-03 ENCOUNTER — Encounter: Payer: Self-pay | Admitting: Internal Medicine

## 2013-05-16 ENCOUNTER — Ambulatory Visit: Payer: 59

## 2013-05-16 ENCOUNTER — Ambulatory Visit (INDEPENDENT_AMBULATORY_CARE_PROVIDER_SITE_OTHER): Payer: 59 | Admitting: Emergency Medicine

## 2013-05-16 VITALS — BP 142/84 | HR 66 | Temp 98.0°F | Resp 16 | Ht 72.0 in | Wt 238.0 lb

## 2013-05-16 DIAGNOSIS — I639 Cerebral infarction, unspecified: Secondary | ICD-10-CM

## 2013-05-16 DIAGNOSIS — E119 Type 2 diabetes mellitus without complications: Secondary | ICD-10-CM

## 2013-05-16 DIAGNOSIS — I10 Essential (primary) hypertension: Secondary | ICD-10-CM

## 2013-05-16 DIAGNOSIS — I6350 Cerebral infarction due to unspecified occlusion or stenosis of unspecified cerebral artery: Secondary | ICD-10-CM

## 2013-05-16 DIAGNOSIS — D582 Other hemoglobinopathies: Secondary | ICD-10-CM

## 2013-05-16 LAB — POCT CBC
Granulocyte percent: 74.3 % (ref 37–80)
HCT, POC: 42.3 % — AB (ref 43.5–53.7)
Hemoglobin: 12.9 g/dL — AB (ref 14.1–18.1)
Lymph, poc: 1.2 (ref 0.6–3.4)
MCH, POC: 29.2 pg (ref 27–31.2)
MCHC: 30.5 g/dL — AB (ref 31.8–35.4)
MCV: 95.7 fL (ref 80–97)
MID (cbc): 0.4 (ref 0–0.9)
MPV: 8.6 fL (ref 0–99.8)
POC Granulocyte: 4.6 (ref 2–6.9)
POC LYMPH PERCENT: 19.8 % (ref 10–50)
POC MID %: 5.9 % (ref 0–12)
Platelet Count, POC: 362 10*3/uL (ref 142–424)
RBC: 4.42 M/uL — AB (ref 4.69–6.13)
RDW, POC: 13.6 %
WBC: 6.2 10*3/uL (ref 4.6–10.2)

## 2013-05-16 LAB — RETICULOCYTES
ABS Retic: 72.8 10*3/uL (ref 19.0–186.0)
RBC.: 4.28 MIL/uL (ref 4.22–5.81)
Retic Ct Pct: 1.7 % (ref 0.4–2.3)

## 2013-05-16 MED ORDER — ZOLPIDEM TARTRATE 5 MG PO TABS: 5.0000 mg | ORAL_TABLET | Freq: Every evening | ORAL | Status: DC | PRN

## 2013-05-16 MED ORDER — CLOPIDOGREL BISULFATE 75 MG PO TABS: 75.0000 mg | ORAL_TABLET | Freq: Every day | ORAL | Status: DC

## 2013-05-16 MED ORDER — SAXAGLIPTIN HCL 5 MG PO TABS: 5.0000 mg | ORAL_TABLET | Freq: Every day | ORAL | Status: DC

## 2013-05-16 MED ORDER — LOSARTAN POTASSIUM-HCTZ 100-25 MG PO TABS: 1.0000 | ORAL_TABLET | Freq: Every day | ORAL | Status: DC

## 2013-05-16 MED ORDER — TRAMADOL HCL 50 MG PO TABS: 50.0000 mg | ORAL_TABLET | Freq: Three times a day (TID) | ORAL | Status: DC | PRN

## 2013-05-16 NOTE — Progress Notes (Signed)
   Subjective:    Patient ID: Phillip [name], male    DOB: 02/20/1951, 63 y.o.   MRN: [id number]  HPI patient here to followup his embolic stroke. He has done well since his hospitalization. No source of the emboli has been found. He is due to have his event monitor checked in the near future. He continues to have some visual disturbance but otherwise has no residual from his stroke.    Review of Systems     Objective:   Physical Exam patient is alert and cooperative. Chest was clear heart regular rate no murmurs carotids are without bruits. Neurological is without focal signs .  Results for orders placed in visit on 05/16/13  POCT CBC      Result Value Range   WBC 6.2  4.6 - 10.2 K/uL   Lymph, poc 1.2  0.6 - 3.4   POC LYMPH PERCENT 19.8  10 - 50 %L   MID (cbc) 0.4  0 - 0.9   POC MID % 5.9  0 - 12 %M   POC Granulocyte 4.6  2 - 6.9   Granulocyte percent 74.3  37 - 80 %G   RBC 4.42 (*) 4.69 - 6.13 M/uL   Hemoglobin 12.9 (*) 14.1 - 18.1 g/dL   HCT, POC 42.3 (*) 43.5 - 53.7 %   MCV 95.7  80 - 97 fL   MCH, POC 29.2  27 - 31.2 pg   MCHC 30.5 (*) 31.8 - 35.4 g/dL   RDW, POC 13.6     Platelet Count, POC 362  142 - 424 K/uL   MPV 8.6  0 - 99.8 fL        Assessment & Plan:  Appointment and will be made with Dr. Leonie Man for his input. Patient will need some form of imaging in the next couple months. Issues still unresolved as to whether this was an acute embolic stroke or related to migraine and migraine medications. I did write prescriptions for Ultram to have for pain. His hemoglobin is borderline low so I adde will follow this up in one month.

## 2013-05-17 ENCOUNTER — Ambulatory Visit: Payer: 59

## 2013-05-23 ENCOUNTER — Ambulatory Visit (INDEPENDENT_AMBULATORY_CARE_PROVIDER_SITE_OTHER): Payer: 59 | Admitting: *Deleted

## 2013-05-23 ENCOUNTER — Encounter: Payer: Self-pay | Admitting: Internal Medicine

## 2013-05-23 DIAGNOSIS — I6350 Cerebral infarction due to unspecified occlusion or stenosis of unspecified cerebral artery: Secondary | ICD-10-CM

## 2013-05-23 DIAGNOSIS — I639 Cerebral infarction, unspecified: Secondary | ICD-10-CM

## 2013-05-23 LAB — MDC_IDC_ENUM_SESS_TYPE_INCLINIC
MDC IDC SESS DTM: 20150122145608
Zone Setting Detection Interval: 2000 ms
Zone Setting Detection Interval: 3000 ms
Zone Setting Detection Interval: 360 ms

## 2013-05-23 NOTE — Progress Notes (Signed)
Loop check in clinic.  Pt with 0 tachy episodes; 3 brady episodes; 0 asystole.  Episodes were appropriate with rates of 45 and PVC's noted.  Wound well healed

## 2013-06-20 ENCOUNTER — Ambulatory Visit (INDEPENDENT_AMBULATORY_CARE_PROVIDER_SITE_OTHER): Payer: 59 | Admitting: *Deleted

## 2013-06-20 DIAGNOSIS — I639 Cerebral infarction, unspecified: Secondary | ICD-10-CM

## 2013-06-20 DIAGNOSIS — I6340 Cerebral infarction due to embolism of unspecified cerebral artery: Secondary | ICD-10-CM

## 2013-06-20 LAB — MDC_IDC_ENUM_SESS_TYPE_REMOTE

## 2013-06-27 ENCOUNTER — Other Ambulatory Visit: Payer: Self-pay | Admitting: Physician Assistant

## 2013-06-28 ENCOUNTER — Other Ambulatory Visit: Payer: Self-pay | Admitting: Internal Medicine

## 2013-06-28 DIAGNOSIS — M109 Gout, unspecified: Secondary | ICD-10-CM

## 2013-06-28 MED ORDER — ALLOPURINOL 300 MG PO TABS: 300.0000 mg | ORAL_TABLET | Freq: Every day | ORAL | Status: DC

## 2013-07-22 ENCOUNTER — Ambulatory Visit (INDEPENDENT_AMBULATORY_CARE_PROVIDER_SITE_OTHER): Payer: 59 | Admitting: *Deleted

## 2013-07-22 DIAGNOSIS — I6350 Cerebral infarction due to unspecified occlusion or stenosis of unspecified cerebral artery: Secondary | ICD-10-CM

## 2013-07-22 DIAGNOSIS — I639 Cerebral infarction, unspecified: Secondary | ICD-10-CM

## 2013-07-25 ENCOUNTER — Encounter: Payer: Self-pay | Admitting: Internal Medicine

## 2013-08-14 ENCOUNTER — Encounter: Payer: Self-pay | Admitting: Neurology

## 2013-08-14 ENCOUNTER — Ambulatory Visit (INDEPENDENT_AMBULATORY_CARE_PROVIDER_SITE_OTHER): Payer: 59 | Admitting: Neurology

## 2013-08-14 VITALS — BP 157/83 | HR 69 | Ht 72.5 in | Wt 237.0 lb

## 2013-08-14 DIAGNOSIS — G43809 Other migraine, not intractable, without status migrainosus: Secondary | ICD-10-CM | POA: Insufficient documentation

## 2013-08-14 DIAGNOSIS — H53412 Scotoma involving central area, left eye: Secondary | ICD-10-CM | POA: Insufficient documentation

## 2013-08-14 DIAGNOSIS — H53419 Scotoma involving central area, unspecified eye: Secondary | ICD-10-CM

## 2013-08-14 NOTE — Patient Instructions (Signed)
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 Continue Plavix for stroke prevention and strict control of hypertension with blood pressure goal below 130/90, lipids with LDL cholesterol goal below 70 mg percent, diabetes regimen with an A1c goal below 7. He was also advised to use CPAP every night. She was offered a chance to participate in the RESPECT ESUS stroke prevention trial but declined. She will return for followup in 6 months or call earlier if necessary.  Stroke Prevention Some medical conditions and behaviors are associated with an increased chance of having a stroke. You may prevent a stroke by making healthy choices and managing medical conditions. HOW CAN I REDUCE MY RISK OF HAVING A STROKE?   Stay physically active. Get at least 30 minutes of activity on most or all days.  Do not smoke. It may also be helpful to avoid exposure to secondhand smoke.  Limit alcohol use. Moderate alcohol use is considered to be:  No more than 2 drinks per day for men.  No more than 1 drink per day for nonpregnant women.  Eat healthy foods. This involves  Eating 5 or more servings of fruits and vegetables a day.  Following a diet that addresses high blood pressure (hypertension), high cholesterol, diabetes, or obesity.  Manage your cholesterol levels.  A diet low in saturated fat, trans fat, and cholesterol and high in fiber may control cholesterol levels.  Take any prescribed medicines to control cholesterol as directed by your health care provider.  Manage your diabetes.  A controlled-carbohydrate, controlled-sugar diet is recommended to manage diabetes.  Take any prescribed medicines to control diabetes as directed by your health care provider.  Control your hypertension.  A low-salt (sodium),  low-saturated fat, low-trans fat, and low-cholesterol diet is recommended to manage hypertension.  Take any prescribed medicines to control hypertension as directed by your health care provider.  Maintain a healthy weight.  A reduced-calorie, low-sodium, low-saturated fat, low-trans fat, low-cholesterol diet is recommended to manage weight.  Stop drug abuse.  Avoid taking birth control pills.  Talk to your health care provider about the risks of taking birth control pills if you are over 50 years old, smoke, get migraines, or have ever had a blood clot.  Get evaluated for sleep disorders (sleep apnea).  Talk to your health care provider about getting a sleep evaluation if you snore a lot or have excessive sleepiness.  Take medicines as directed by your health care provider.  For some people, aspirin or blood thinners (anticoagulants) are helpful in reducing the risk of forming abnormal blood clots that can lead to stroke. If you have the irregular heart rhythm of atrial fibrillation, you should be on a blood thinner unless there is a good reason you cannot take them.  Understand all your medicine instructions.  Make sure that other other conditions (such as anemia or atherosclerosis) are addressed. SEEK IMMEDIATE MEDICAL CARE IF:   You have sudden weakness or numbness of the face, arm, or leg, especially on one side of the body.  Your face or eyelid droops to one side.  You have sudden confusion.  You have trouble speaking (aphasia) or understanding.  You have sudden trouble seeing in one or both eyes.  You have sudden trouble walking.  You have dizziness.  You have a loss of balance or coordination.  You have a sudden, severe headache with no known cause.  You have new chest pain or an irregular heartbeat. Any of these symptoms may represent a serious problem that is an emergency. Do not wait to see if the symptoms will go away. Get medical help at once. Call your local  emergency services  (911 in [place] Do not drive yourself to the hospital. Document Released: 05/26/2004 Document Revised: 02/06/2013 Document Reviewed: 10/19/2012 [facility] Patient Information 2014 [place].

## 2013-08-14 NOTE — Progress Notes (Signed)
[facility] [address] [place] [address] [phone]       OFFICE FOLLOW-UP NOTE  Mr. 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 He developed vision difficulties on 04/16/13 in the afternoon while seeing the patient and noticed that he was not able to see half of the face of the patient and everything started going squiggly and whiteness in his vision field on the right. He also noticed a left hemicranial headache which began around the eyes but spread. The headache subsided but the vision disturbances lasted several hours which was quite unusual for him. She took Relpax at the time of his headache which seemed to help the headache. Next day when he woke up is still had vision difficulties and was referred directly to see the ophthalmologist. He had no associated vertigo, diplopia, difficulty swallowing, focal weakness, numbness, unsteady gait, slurred speech or confusion or language impairment. MRI scan of the brain done as  outpatient showed an acute 1.5 cm left occipital lobe infarct. The patient's vision symptoms were improving but he still had some vision loss in the central field of the right eye. Telemetry monitoring did not reveal atrial fibrillation. Transthoracic echo and transesophageal echo both unremarkable. Carotid ultrasound showed no significant extra 10 stenosis. MRI of the brain showed no large vessel occlusion. Lipid profile was normal hemoglobin A1c was elevated at 7.7. Urine drug screen was negative. Patient was changed from aspirin to Plavix for secondary stroke prevention and underwent a loop recorder implantation for protection of process vital fibrillation. He states his done well since discharge his total Plavix well without any significant side  effects. He has noted improvement in his vision difficulties though he still has some difficulty reading at times when her daughter was at the number the right side appears to be slightly tilted and he is to move his head to read clearly. Is back to work and working full-time without any restrictions or difficulties. He was unable to tolerate Lipitor which has been switched to Crestor 5 mg every other day. He states his blood pressure has been running well at home in the 140s but it is slightly elevated in office today. He does exercise regularly and watches his diet. He does use his CPAP every night. He has not had any recurrent episodes of migraine blood is anxious about it happening again. He has had multiple prior episodes of visual migraine symptoms with or without headaches but they usually last only 15-20 minutes and have never lasted longer than half an hour until this episode in December last year. He has not had any recurrent migraine episodes since then.  ROS:   14 system review of systems is positive for ringing in the ears, vision difficulty and all other systems negative PMH:  Past Medical History  Diagnosis Date  . Anemia   . Arthritis   . Diabetes mellitus   . Hyperlipidemia   . Hypertension   . Sleep apnea     cpap  . Gout   . Osteoarthritis of left hip 11/20/2012  . Localized osteoarthritis of left knee 11/20/2012  . Neuromuscular disorder     " mild periferal neauropathy  . PVC (premature ventricular contraction)   . Migraine headache with aura     Social History:  History   Social History  . Marital Status: Married    Spouse Name: Learta Codding    Number of Children: 4  . Years of Education: 23   Occupational History  . MD [employer]   Social History Main Topics  . Smoking status: Never Smoker   . Smokeless tobacco: Never Used  . Alcohol Use: No  . Drug Use: No  . Sexual Activity: Not on file   Other Topics Concern  . Not on file   Social History Narrative    Patient lives at home with family.   Caffeine Use: occasionally tea    Medications:   Current Outpatient Prescriptions on File Prior to Visit  Medication Sig Dispense Refill  . acetaminophen (TYLENOL) 650 MG CR tablet Take 1,300 mg by mouth every 8 (eight) hours as needed for pain.      [name] allopurinol (ZYLOPRIM) 300 MG tablet Take 1 tablet (300 mg total) by mouth daily.  90 tablet  3  . atorvastatin (LIPITOR) 40 MG tablet Take 1 tablet (40 mg total) by mouth daily.  30 tablet  0  . clopidogrel (PLAVIX) 75 MG tablet Take 1 tablet (75 mg total) by mouth daily with breakfast.  90 tablet  3  . fenofibrate micronized (LOFIBRA) 134 MG capsule Take 134 mg by mouth daily before breakfast.      . losartan-hydrochlorothiazide (HYZAAR) 100-25 MG per tablet Take 1 tablet by mouth daily.  90 tablet  3  . metaxalone (SKELAXIN) 800 MG tablet Take 1 tablet (800 mg total) by mouth every 6 (six) hours as needed.  60 tablet  1  . metFORMIN (GLUMETZA) 1000 MG (MOD) 24 hr tablet Take 1,000 mg by mouth 2 (two) times daily with a meal.      . metoprolol succinate (TOPROL-XL) 50 MG 24 hr tablet Take 50 mg by mouth 2 (two) times daily. Take with or immediately following a meal.      . naproxen sodium (ANAPROX) 220 MG tablet Take 440 mg by mouth daily as needed (pain).      . saxagliptin HCl (ONGLYZA) 5 MG TABS tablet Take 1 tablet (5 mg total) by mouth daily.  90 tablet  3  . SM LANCETS 33G MISC USE TO TEST BLOOD SUGAR TWICE DAILY  400 each  PRN  . traMADol (ULTRAM) 50 MG tablet Take 1 tablet (50 mg total) by mouth every 8 (eight) hours as needed.  90 tablet  0  . TRUETEST TEST test strip USE TO TEST TWICE DAILY  400 each  PRN  . zolpidem (AMBIEN) 5 MG tablet Take 1 tablet (5 mg total) by mouth at bedtime as needed for sleep.  90 tablet  1   No current facility-administered medications on file prior to visit.    Allergies:   Allergies  Allergen Reactions  . Sulfa Antibiotics Other (See Comments)    Unknown  reaction    Physical Exam General: well developed, well nourished, seated, in no evident distress Head: head normocephalic and atraumatic. Orohparynx benign   Musculoskeletal: no deformity Skin:  no rash/petichiae    Filed Vitals:   08/14/13 1533  BP: 157/83  Pulse: 69   Neurologic Exam Mental Status: Awake and fully alert. Oriented to place and time. Recent and remote memory intact. Attention span, concentration and fund of knowledge appropriate. Mood and affect appropriate.  Cranial Nerves: Fundoscopic exam not done  . Pupils equal, briskly reactive to light. Extraocular movements full without nystagmus. Visual fields full to  confrontation. Hearing intact. Facial sensation intact. Face, tongue, palate moves normally and symmetrically.  Motor: Normal bulk and tone. Normal strength in all tested extremity muscles. Sensory.: intact to touch and pinprick and vibratory sensation.  Coordination: Rapid alternating movements normal in all extremities. Finger-to-nose and heel-to-shin performed accurately bilaterally. Gait and Station: Arises from chair without difficulty. Stance is normal. Gait demonstrates normal stride length and balance . Able to heel, toe and tandem walk without difficulty.  Reflexes: 1+ and symmetric. Toes downgoing.   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 Vascular risk factors of hypertension,  diabetes, hyperlipidemia and sleep apnea.    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 Continue Plavix for stroke prevention and strict control of hypertension with blood pressure goal below 130/90, lipids with LDL cholesterol goal below  70 mg percent, diabetes regimen with an A1c goal below 7. He was also advised to use CPAP every night. She was offered a chance to participate in the RESPECT ESUS stroke prevention trial but declined. She will return for followup in 6 months or call earlier if necessary.     Note: This document was prepared with digital dictation and possible smart phrase technology. Any transcriptional errors that result from this process are unintentional

## 2013-08-20 LAB — MDC_IDC_ENUM_SESS_TYPE_REMOTE

## 2013-08-21 ENCOUNTER — Ambulatory Visit (INDEPENDENT_AMBULATORY_CARE_PROVIDER_SITE_OTHER): Payer: 59 | Admitting: *Deleted

## 2013-08-21 DIAGNOSIS — I6350 Cerebral infarction due to unspecified occlusion or stenosis of unspecified cerebral artery: Secondary | ICD-10-CM

## 2013-08-21 DIAGNOSIS — I639 Cerebral infarction, unspecified: Secondary | ICD-10-CM

## 2013-08-21 LAB — MDC_IDC_ENUM_SESS_TYPE_REMOTE

## 2013-08-27 ENCOUNTER — Other Ambulatory Visit: Payer: Self-pay | Admitting: Emergency Medicine

## 2013-08-28 NOTE — Telephone Encounter (Signed)
Dr Everlene Farrier, I don't see where you have Rxd this for pt in EPIC. Do you want to RX?

## 2013-09-03 ENCOUNTER — Ambulatory Visit (INDEPENDENT_AMBULATORY_CARE_PROVIDER_SITE_OTHER): Payer: 59

## 2013-09-03 DIAGNOSIS — I639 Cerebral infarction, unspecified: Secondary | ICD-10-CM

## 2013-09-03 DIAGNOSIS — I6350 Cerebral infarction due to unspecified occlusion or stenosis of unspecified cerebral artery: Secondary | ICD-10-CM

## 2013-09-03 LAB — MDC_IDC_ENUM_SESS_TYPE_REMOTE

## 2013-09-05 ENCOUNTER — Encounter: Payer: Self-pay | Admitting: Internal Medicine

## 2013-10-22 ENCOUNTER — Encounter: Payer: Self-pay | Admitting: Neurology

## 2013-10-22 ENCOUNTER — Ambulatory Visit (INDEPENDENT_AMBULATORY_CARE_PROVIDER_SITE_OTHER): Payer: 59 | Admitting: *Deleted

## 2013-10-22 DIAGNOSIS — I639 Cerebral infarction, unspecified: Secondary | ICD-10-CM

## 2013-10-22 DIAGNOSIS — I6350 Cerebral infarction due to unspecified occlusion or stenosis of unspecified cerebral artery: Secondary | ICD-10-CM

## 2013-10-22 LAB — MDC_IDC_ENUM_SESS_TYPE_REMOTE

## 2013-10-24 ENCOUNTER — Ambulatory Visit (INDEPENDENT_AMBULATORY_CARE_PROVIDER_SITE_OTHER): Payer: 59 | Admitting: Emergency Medicine

## 2013-10-24 ENCOUNTER — Encounter: Payer: Self-pay | Admitting: Emergency Medicine

## 2013-10-24 VITALS — BP 158/91 | HR 98 | Temp 97.6°F | Resp 16 | Ht 72.0 in | Wt 225.4 lb

## 2013-10-24 DIAGNOSIS — E119 Type 2 diabetes mellitus without complications: Secondary | ICD-10-CM

## 2013-10-24 DIAGNOSIS — I10 Essential (primary) hypertension: Secondary | ICD-10-CM

## 2013-10-24 DIAGNOSIS — E785 Hyperlipidemia, unspecified: Secondary | ICD-10-CM

## 2013-10-24 LAB — LIPID PANEL
Cholesterol: 175 mg/dL (ref 0–200)
HDL: 38 mg/dL — AB (ref >39)
LDL Cholesterol: 94 mg/dL (ref 0–99)
Total CHOL/HDL Ratio: 4.6 Ratio
Triglycerides: 216 mg/dL — ABNORMAL HIGH (ref <150)
VLDL: 43 mg/dL — ABNORMAL HIGH (ref 0–40)

## 2013-10-24 LAB — COMPLETE METABOLIC PANEL WITH GFR
ALT: 18 U/L (ref 0–53)
AST: 13 U/L (ref 0–37)
Albumin: 4.1 g/dL (ref 3.5–5.2)
Alkaline Phosphatase: 48 U/L (ref 39–117)
BILIRUBIN TOTAL: 0.6 mg/dL (ref 0.2–1.2)
BUN: 22 mg/dL (ref 6–23)
CO2: 24 mEq/L (ref 19–32)
CREATININE: 0.91 mg/dL (ref 0.50–1.35)
Calcium: 9.6 mg/dL (ref 8.4–10.5)
Chloride: 101 mEq/L (ref 96–112)
GFR, Est African American: >89 mL/min
GFR, Est Non African American: 89 mL/min
Glucose, Bld: 185 mg/dL — ABNORMAL HIGH (ref 70–99)
Potassium: 4 mEq/L (ref 3.5–5.3)
Sodium: 135 mEq/L (ref 135–145)
Total Protein: 6.5 g/dL (ref 6.0–8.3)

## 2013-10-24 LAB — CBC WITH DIFFERENTIAL/PLATELET
BASOS ABS: 0 10*3/uL (ref 0.0–0.1)
BASOS PCT: 0 % (ref 0–1)
Eosinophils Absolute: 0.1 10*3/uL (ref 0.0–0.7)
Eosinophils Relative: 2 % (ref 0–5)
HCT: 37.8 % — ABNORMAL LOW (ref 39.0–52.0)
Hemoglobin: 12.6 g/dL — ABNORMAL LOW (ref 13.0–17.0)
LYMPHS PCT: 30 % (ref 12–46)
Lymphs Abs: 1.5 10*3/uL (ref 0.7–4.0)
MCH: 30.1 pg (ref 26.0–34.0)
MCHC: 33.3 g/dL (ref 30.0–36.0)
MCV: 90.4 fL (ref 78.0–100.0)
Monocytes Absolute: 0.4 10*3/uL (ref 0.1–1.0)
Monocytes Relative: 8 % (ref 3–12)
Neutro Abs: 3 10*3/uL (ref 1.7–7.7)
Neutrophils Relative %: 60 % (ref 43–77)
PLATELETS: 285 10*3/uL (ref 150–400)
RBC: 4.18 MIL/uL — ABNORMAL LOW (ref 4.22–5.81)
RDW: 13.4 % (ref 11.5–15.5)
WBC: 5 10*3/uL (ref 4.0–10.5)

## 2013-10-24 LAB — POCT GLYCOSYLATED HEMOGLOBIN (HGB A1C): Hemoglobin A1C: 8.4

## 2013-10-24 MED ORDER — ROSUVASTATIN CALCIUM 5 MG PO TABS: ORAL_TABLET | ORAL | Status: DC

## 2013-10-24 NOTE — Progress Notes (Signed)
   Subjective:    Patient ID: Phillip [name], male    DOB: [birth year], 63 y.o.   MRN: [id number]  HPI patient enters for followup of diabetes and hypertension. He is concerned because his sugars have been running high. He is currently on max dose of metformin and onglyza. He is not having chest pain or shortness of breath he's been working in the garden without any difficulty.    Review of Systems     Objective:   Physical Exam blood pressure initially elevated on the left 150/86 better on the right 140/86. Chest was clear heart regular rate and no extra beats were heard        Assessment & Plan:  Patient is not having good control of his diabetes at the present time. His blood pressure is up slightly he is interested in possibly Hytrin or Cardura for this. We'll wait on his blood work results he will make an appointment to see Dr. Wynonia Lawman for evaluation. I also will discuss his situation with Dr. Chalmers Cater.

## 2013-10-25 NOTE — Progress Notes (Signed)
Loop recorder 

## 2013-11-11 ENCOUNTER — Encounter: Payer: Self-pay | Admitting: Cardiology

## 2013-11-11 NOTE — Progress Notes (Signed)
Patient ID: Phillip [name], male   DOB: 12-Oct-1950, 63 y.o.   MRN: [id number]   Phillip, [name]    Date of visit:  11/11/2013 DOB:  1951-03-09    Age:  63 yrs. Medical record number:  [id number]     Account number:  [id number] Primary Care Provider: Arlyss Queen A ____________________________ CURRENT DIAGNOSES  1. CAD,Native  2. Obesity(BMI30-40)  3. Hypertension,Essential (Benign)  4. Diabetes Mellitus-NIDD  5. Arrhythmia-Ventricular Tachycardia  6. Sleep apnea  7. Palpitations ____________________________ ALLERGIES  Sulfa (Sulfonamides), Intolerance-unknown ____________________________ MEDICATIONS  1. ibuprofen 200 mg tablet, PRN  2. allopurinol 300 mg tablet, 1 p.o. daily  3. metformin 1,000 mg tablet, BID  4. losartan-hydrochlorothiazide 100-12.5 mg tablet, 1 p.o. daily  5. Onglyza 5 mg tablet, 1 p.o. daily  6. clopidogrel 75 mg tablet, 1 p.o. daily  7. Crestor 5 mg tablet, 1 p.o. daily  8. Ambien 5 mg tablet, QHS  9. Bystolic 10 mg tablet, 1 p.o. 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 He has had an eventful year. He had Pap and knee surgery he tolerated well. In December patient had a stroke when he began to have some visual problems. At that time it was thought to possibly be a prolonged migraine and his workup was essentially negative. Pain loop monitor implanted that has not shown any atrial fibrillation. At that time he was placed on Plavix and was tried on Lipitor but did not tolerate this and was placed on Crestor 5 mg every other day. He has been able to take a mission's trip to the [place]. He denies angina and has not had a recurrence of ventricular tachycardia. He notes that his diabetes has not been as well-controlled as to whether he can change from metoprolol to Bystolic for blood pressure  control. He only has a little complaints reading numbers up to the right side but otherwise has no neurologic deficits and tolerated his overseas trip fine. He denies other cardiac symptoms. He has been able to titrate his Crestor up to 5 mg daily. ____________________________ PAST HISTORY  Past Medical Illnesses:  hypertension, DM-non-insulin dependent, obesity, history of migraine headaches, E. Coli sepsis 1998 with acute cholecystitis, sleep apnea, gout, prior CVA 12/14;  Cardiovascular Illnesses:  RBBB, CAD;  Surgical Procedures:  cholecystectomy (lap), knee surgery, left, tonsillectomy, vasectomy, hip surgery;  Cardiology Procedures-Invasive:  no history of prior cardiac procedures;  Cardiology Procedures-Noninvasive:  stress echocardiogram, treadmill cardiolite May 2009, treadmill January 2010, treadmill August 2013, event monitor June 2014, echocardiogram July 2014;  Cardiac Cath Results:  normal Left main, ecctatic proximal LAD, 20 % stenosis proximal LAD, normal CFX, scattered irregularities RCA;  60,   ____________________________ CARDIO-PULMONARY TEST DATES EKG Date:  10/15/2012;   Cardiac Cath Date:  09/30/2007;  Holter/Event Monitor Date: 10/15/2012;  Nuclear Study Date:  09/12/2007;  Echocardiography Date: 10/31/2012;  Chest Xray Date: 09/20/2007;   ____________________________ FAMILY HISTORY Brother -- Brother alive with problem, Diabetes mellitus, Hypertension Father -- Father dead, Pulmonary embolism Mother -- Mother alive with problem, Diabetes mellitus Sister -- Sister alive and well Sister -- Diabetes mellitus, Sister alive with problem ____________________________ SOCIAL HISTORY Alcohol Use:  no alcohol use;  Smoking:  never smoked;  Diet:  regular diet;  Lifestyle:  married and 4 children;  Exercise:  walking for approximately 45 minutes 6 days per week;  Occupation:  physician;  Residence:  lives with wife;   ____________________________ REVIEW OF SYSTEMS General:  obesity,  weight loss of 6 pounds Eyes: wears eye glasses/contact lenses mld visual field disturbance  Respiratory: denies dyspnea, cough, wheezing or hemoptysis. Cardiovascular:  please review HPI Abdominal: denies dyspepsia, GI bleeding, constipation, or diarrhea Musculoskeletal:  mild arthritis of the knees, aching, neck pain Neurological:  see HPI  ____________________________ PHYSICAL EXAMINATION VITAL SIGNS  Blood Pressure:  160/72 Sitting, Right arm, regular cuff  , 164/70 Standing, Right arm and regular cuff   Pulse:  66/min. Weight:  229.00 lbs. Height:  73"BMI: 30  Constitutional:  pleasant white male in no acute distress, mildly obese Skin:  warm and dry to touch, no apparent skin lesions, or masses noted. Head:  normocephalic, normal hair pattern, no masses or tenderness ENT:  ears, nose and throat reveal no gross abnormalities.  Dentition good. Neck:  supple, without massess. No JVD, thyromegaly or carotid bruits. Carotid upstroke normal. Chest:  normal symmetry, clear to auscultation and percussion. Cardiac:  regular rhythm, normal S1 and S2, No S3 or S4, no murmurs, gallops or rubs detected. Peripheral Pulses:  the femoral,dorsalis pedis, and posterior tibial pulses are full and equal bilaterally with no bruits auscultated. Extremities & Back:  no deformities, clubbing, cyanosis, erythema or edema observed. Normal muscle strength and tone. Neurological:  no gross motor or sensory deficits noted, affect appropriate, oriented x3. ____________________________ MOST RECENT LIPID PANEL 10/24/12  CHOL TOTL 129 mg/dl, LDL 63 calc, HDL 43 mg/dl, TRIGLYCER 115 mg/dl and CHOL/HDL 3.0 (Calc) ____________________________ IMPRESSIONS/PLAN  1. Coronary artery disease which is stable 2. Hypertension currently not well controlled patient's blood pressure was 140/80 when I retook it 3. Hyperlipidemia under treatment 4. Prior stroke versus migraine equivalent 5. Obesity with need to lose additional  weight  Recommendations:  We changed from metoprolol 50 twice a day to Bystolic 10 mg daily. He is point to monitor his blood pressure and we may increase the Bystolic to 10 mg twice a day. Recheck lipid panel. See back in 6 months but call if blood pressure remains elevated. Continue to monitor the loop monitor transtelephonic. ____________________________ TODAYS ORDERS  1. Return Visit: 6 months                       ____________________________ Cardiology Physician:  Kerry Hough MD [facility]

## 2013-11-26 ENCOUNTER — Other Ambulatory Visit: Payer: Self-pay | Admitting: Emergency Medicine

## 2013-11-26 MED ORDER — ZOLPIDEM TARTRATE 5 MG PO TABS: 5.0000 mg | ORAL_TABLET | Freq: Every evening | ORAL | Status: DC | PRN

## 2013-12-24 ENCOUNTER — Telehealth: Payer: Self-pay | Admitting: Radiology

## 2013-12-24 DIAGNOSIS — E119 Type 2 diabetes mellitus without complications: Secondary | ICD-10-CM

## 2013-12-24 NOTE — Telephone Encounter (Signed)
Have spoken to Dr Dockter/ Dr Everlene Farrier wants him to see Dr Chalmers Cater, am resending referral

## 2014-01-22 ENCOUNTER — Telehealth: Payer: Self-pay | Admitting: Family Medicine

## 2014-01-22 ENCOUNTER — Other Ambulatory Visit: Payer: Self-pay | Admitting: Family Medicine

## 2014-01-22 MED ORDER — METFORMIN HCL ER (MOD) 1000 MG PO TB24: 1000.0000 mg | ORAL_TABLET | Freq: Two times a day (BID) | ORAL | Status: DC

## 2014-01-22 NOTE — Telephone Encounter (Signed)
Spoke with pharm and verified that he does want the generic Metformin.

## 2014-01-22 NOTE — Telephone Encounter (Signed)
Phillip [name] states that they do not have the Glumetza, only the Metformin.

## 2014-01-23 ENCOUNTER — Encounter: Payer: Self-pay | Admitting: Internal Medicine

## 2014-01-24 ENCOUNTER — Encounter: Payer: Self-pay | Admitting: Internal Medicine

## 2014-01-30 ENCOUNTER — Other Ambulatory Visit: Payer: Self-pay | Admitting: Radiology

## 2014-01-30 DIAGNOSIS — Z0000 Encounter for general adult medical examination without abnormal findings: Secondary | ICD-10-CM

## 2014-01-31 ENCOUNTER — Ambulatory Visit (INDEPENDENT_AMBULATORY_CARE_PROVIDER_SITE_OTHER): Payer: 59 | Admitting: Emergency Medicine

## 2014-01-31 ENCOUNTER — Encounter: Payer: Self-pay | Admitting: Emergency Medicine

## 2014-01-31 VITALS — BP 152/90 | HR 58 | Temp 97.8°F | Resp 16 | Ht 72.0 in | Wt 226.0 lb

## 2014-01-31 DIAGNOSIS — E119 Type 2 diabetes mellitus without complications: Secondary | ICD-10-CM

## 2014-01-31 DIAGNOSIS — Z0000 Encounter for general adult medical examination without abnormal findings: Secondary | ICD-10-CM

## 2014-01-31 DIAGNOSIS — I10 Essential (primary) hypertension: Secondary | ICD-10-CM

## 2014-01-31 DIAGNOSIS — M1A1790 Lead-induced chronic gout, unspecified ankle and foot, without tophus (tophi): Secondary | ICD-10-CM

## 2014-01-31 DIAGNOSIS — Z111 Encounter for screening for respiratory tuberculosis: Secondary | ICD-10-CM

## 2014-01-31 DIAGNOSIS — T560X1D Toxic effect of lead and its compounds, accidental (unintentional), subsequent encounter: Secondary | ICD-10-CM

## 2014-01-31 LAB — POCT CBC
GRANULOCYTE PERCENT: 67.6 % (ref 37–80)
HEMATOCRIT: 47.1 % (ref 43.5–53.7)
HEMOGLOBIN: 15.2 g/dL (ref 14.1–18.1)
Lymph, poc: 1.5 (ref 0.6–3.4)
MCH, POC: 30.3 pg (ref 27–31.2)
MCHC: 32.3 g/dL (ref 31.8–35.4)
MCV: 93.6 fL (ref 80–97)
MID (cbc): 0.4 (ref 0–0.9)
MPV: 7.4 fL (ref 0–99.8)
POC GRANULOCYTE: 4 (ref 2–6.9)
POC LYMPH %: 25.1 % (ref 10–50)
POC MID %: 7.3 %M (ref 0–12)
Platelet Count, POC: 212 10*3/uL (ref 142–424)
RBC: 5.03 M/uL (ref 4.69–6.13)
RDW, POC: 13.5 %
WBC: 5.9 10*3/uL (ref 4.6–10.2)

## 2014-01-31 LAB — COMPLETE METABOLIC PANEL WITH GFR
ALK PHOS: 49 U/L (ref 39–117)
ALT: 17 U/L (ref 0–53)
AST: 13 U/L (ref 0–37)
Albumin: 4.5 g/dL (ref 3.5–5.2)
BILIRUBIN TOTAL: 0.6 mg/dL (ref 0.2–1.2)
BUN: 22 mg/dL (ref 6–23)
CO2: 29 meq/L (ref 19–32)
CREATININE: 0.94 mg/dL (ref 0.50–1.35)
Calcium: 9.9 mg/dL (ref 8.4–10.5)
Chloride: 100 mEq/L (ref 96–112)
GFR, Est African American: >89 mL/min
GFR, Est Non African American: 85 mL/min
GLUCOSE: 186 mg/dL — AB (ref 70–99)
Potassium: 4.5 mEq/L (ref 3.5–5.3)
Sodium: 137 mEq/L (ref 135–145)
TOTAL PROTEIN: 6.7 g/dL (ref 6.0–8.3)

## 2014-01-31 LAB — LIPID PANEL
CHOL/HDL RATIO: 2.5 ratio
CHOLESTEROL: 107 mg/dL (ref 0–200)
HDL: 43 mg/dL (ref >39)
LDL Cholesterol: 42 mg/dL (ref 0–99)
TRIGLYCERIDES: 109 mg/dL (ref <150)
VLDL: 22 mg/dL (ref 0–40)

## 2014-01-31 LAB — GLUCOSE, POCT (MANUAL RESULT ENTRY): POC GLUCOSE: 217 mg/dL — AB (ref 70–99)

## 2014-01-31 LAB — URIC ACID: Uric Acid, Serum: 5.4 mg/dL (ref 4.0–7.8)

## 2014-01-31 LAB — POCT GLYCOSYLATED HEMOGLOBIN (HGB A1C): HEMOGLOBIN A1C: 8.1

## 2014-01-31 MED ORDER — DOXYCYCLINE HYCLATE 100 MG PO TABS: ORAL_TABLET | ORAL | Status: DC

## 2014-01-31 NOTE — Progress Notes (Signed)
Subjective:    Patient ID: ISSIAC JAMAR, male    DOB: Sep 16, 1950, 63 y.o.   MRN: [id number]  HPI patient here for followup of diabetes hypertension hyperlipidemia. He has had no recurrent symptoms of his stroke. He is doing well regarding his headaches. He is in today for followup prior to going [place] on [place]. He was continued on all his medications. He is frustrated because sugars are. He is getting up 2 times per night. Patient has also not had appropriate followup of his tuberculosisexposure in the office to    Review of Systems     Objective:   Physical Exam  Constitutional: He appears well-developed and well-nourished.  HENT:  Head: Normocephalic.  Eyes: Pupils are equal, round, and reactive to light.  Neck: Normal range of motion. No thyromegaly present.  Cardiovascular: Normal rate and regular rhythm.  Exam reveals no gallop and no friction rub.   No murmur heard. Pulmonary/Chest: Effort normal and breath sounds normal. No respiratory distress. He has no wheezes.  Abdominal: Bowel sounds are normal. There is no tenderness. There is no rebound.  EXT patient has decreased sensation of the toes with significant hammertoe deformity.    Results for orders placed in visit on 01/31/14  POCT CBC      Result Value Ref Range   WBC 5.9  4.6 - 10.2 K/uL   Lymph, poc 1.5  0.6 - 3.4   POC LYMPH PERCENT 25.1  10 - 50 %L   MID (cbc) 0.4  0 - 0.9   POC MID % 7.3  0 - 12 %M   POC Granulocyte 4.0  2 - 6.9   Granulocyte percent 67.6  37 - 80 %G   RBC 5.03  4.69 - 6.13 M/uL   Hemoglobin 15.2  14.1 - 18.1 g/dL   HCT, POC 47.1  43.5 - 53.7 %   MCV 93.6  80 - 97 fL   MCH, POC 30.3  27 - 31.2 pg   MCHC 32.3  31.8 - 35.4 g/dL   RDW, POC 13.5     Platelet Count, POC 212  142 - 424 K/uL   MPV 7.4  0 - 99.8 fL  GLUCOSE, POCT (MANUAL RESULT ENTRY)      Result Value Ref Range   POC Glucose 217 (*) 70 - 99 mg/dl  POCT GLYCOSYLATED HEMOGLOBIN (HGB A1C)      Result Value Ref  Range   Hemoglobin A1C 8.1        Assessment & Plan:  Routine labs done today. On return from his mission trip we'll consider ivokana. Tuberculosis PPD screening was due to  recent exposure. I spoke to infectious disease. They follow exposures to the health Department .    Do you currently have any of the following symptoms?  1. Unexplained cough lasting more than 3 weeks? no  Unexplained fever lasting more than 3 weeks. No   Tuberculosis Risk Questionnaire  1. No Were you born outside the [place] in one of the following parts of the world: [place], [place], [place], [place] or [place]?    2. Yes  Have you traveled outside the [place] and lived for more than one month in one of the following parts of the world: [place], [place], [place], [place] or [place]?    3. No Do you have a compromised immune system such as from any of the following conditions:HIV/AIDS, organ or bone marrow transplantation, diabetes, immunosuppressive medicines (e.g.  Prednisone, Remicaide), leukemia, lymphoma, cancer of the head or neck, gastrectomy or jejunal bypass, end-stage renal disease (on dialysis), or silicosis?     4. Yes  Have you ever or do you plan on working in: a residential care center, a health care facility, a jail or prison or homeless shelter?    5. No Have you ever: injected illegal drugs, used crack cocaine, lived in a homeless shelter  or been in jail or prison?     6. Yes  Have you ever been exposed to anyone with infectious tuberculosis?    Tuberculosis Symptom Questionnaire  Do you currently have any of the following symptoms?  1. No Unexplained cough lasting more than 3 weeks?   2. No Unexplained fever lasting more than 3 weeks.    3. Night Sweats (sweating that leaves the bedclothes and sheets wet)   No  4. Shortness of Breath No  5. Chest Pain No  6. Unintentional weight loss  No  7. Unexplained fatigue (very tired for no reason) No

## 2014-02-02 LAB — TB SKIN TEST
Induration: 0 mm
TB Skin Test: NEGATIVE

## 2014-02-19 ENCOUNTER — Encounter: Payer: Self-pay | Admitting: Nurse Practitioner

## 2014-03-01 ENCOUNTER — Other Ambulatory Visit: Payer: Self-pay | Admitting: Emergency Medicine

## 2014-03-01 DIAGNOSIS — E1165 Type 2 diabetes mellitus with hyperglycemia: Secondary | ICD-10-CM

## 2014-03-01 MED ORDER — CANAGLIFLOZIN 100 MG PO TABS: 100.0000 mg | ORAL_TABLET | Freq: Every day | ORAL | Status: DC

## 2014-03-03 ENCOUNTER — Other Ambulatory Visit: Payer: Self-pay | Admitting: *Deleted

## 2014-03-03 DIAGNOSIS — E1165 Type 2 diabetes mellitus with hyperglycemia: Secondary | ICD-10-CM

## 2014-03-03 MED ORDER — CANAGLIFLOZIN 100 MG PO TABS: 100.0000 mg | ORAL_TABLET | Freq: Every day | ORAL | Status: DC

## 2014-03-10 ENCOUNTER — Ambulatory Visit: Payer: 59 | Admitting: Nurse Practitioner

## 2014-03-18 ENCOUNTER — Ambulatory Visit: Payer: 59 | Admitting: Neurology

## 2014-03-24 ENCOUNTER — Ambulatory Visit: Payer: 59 | Admitting: Neurology

## 2014-04-10 ENCOUNTER — Encounter (HOSPITAL_COMMUNITY): Payer: Self-pay | Admitting: Internal Medicine

## 2014-04-18 ENCOUNTER — Other Ambulatory Visit: Payer: Self-pay | Admitting: Emergency Medicine

## 2014-04-30 ENCOUNTER — Other Ambulatory Visit: Payer: Self-pay | Admitting: Emergency Medicine

## 2014-04-30 MED ORDER — AMOXICILLIN 875 MG PO TABS: 875.0000 mg | ORAL_TABLET | Freq: Two times a day (BID) | ORAL | Status: DC

## 2014-05-20 ENCOUNTER — Ambulatory Visit (INDEPENDENT_AMBULATORY_CARE_PROVIDER_SITE_OTHER): Payer: 59 | Admitting: Neurology

## 2014-05-20 ENCOUNTER — Encounter: Payer: Self-pay | Admitting: Neurology

## 2014-05-20 VITALS — BP 124/64 | HR 74 | Ht 72.0 in | Wt 225.6 lb

## 2014-05-20 DIAGNOSIS — I639 Cerebral infarction, unspecified: Secondary | ICD-10-CM

## 2014-05-20 NOTE — Patient Instructions (Signed)
I had a long discussion the patient with regards to risk of recurrent stroke/TIA, aggressive risk factor reduction and answered questions. Continue Plavix for stroke prevention with strict control of hypertension with blood pressure goal below 130/90, lipids with LDL cholesterol goal below 70 mg percent and diabetes with hemoglobin A1c goal below 6.5%. Continue to use CPAP mask every night. Check follow-up carotid ultrasound study. Return for follow-up in one year or call earlier if necessary.

## 2014-05-20 NOTE — Progress Notes (Signed)
[facility] [address] [place]. [place] [address] [phone]       OFFICE FOLLOW-UP NOTE  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 He developed vision difficulties on 04/16/13 in the afternoon while seeing the patient and noticed that he was not able to see half of the face of the patient and everything started going squiggly and whiteness in his vision field on the right. He also noticed a left hemicranial headache which began around the eyes but spread. The headache subsided but the vision disturbances lasted several hours which was quite unusual for him. She took Relpax at the time of his headache which seemed to help the headache. Next day when he woke up is still had vision difficulties and was referred directly to see the ophthalmologist. He had no associated vertigo, diplopia, difficulty swallowing, focal weakness, numbness, unsteady gait, slurred speech or confusion or language impairment. MRI scan of the brain done as  outpatient showed an acute 1.5 cm left occipital lobe infarct. The patient's vision symptoms were improving but he still had some vision loss in the central field of the right eye. Telemetry monitoring did not reveal atrial fibrillation. Transthoracic echo and transesophageal echo both unremarkable. Carotid ultrasound showed no significant extra 10 stenosis. MRI of the brain showed no large vessel occlusion. Lipid profile was normal hemoglobin A1c was elevated at 7.7. Urine drug screen was negative. Patient was changed from aspirin to Plavix for secondary stroke prevention and underwent a loop recorder implantation for protection of process vital fibrillation. He states his done well since discharge his total Plavix well without any significant side  effects. He has noted improvement in his vision difficulties though he still has some difficulty reading at times when her daughter was at the number the right side appears to be slightly tilted and he is to move his head to read clearly. Is back to work and working full-time without any restrictions or difficulties. He was unable to tolerate Lipitor which has been switched to Crestor 5 mg every other day. He states his blood pressure has been running well at home in the 140s but it is slightly elevated in office today. He does exercise regularly and watches his diet. He does use his CPAP every night. He has not had any recurrent episodes of migraine blood is anxious about it happening again. He has had multiple prior episodes of visual migraine symptoms with or without headaches but they usually last only 15-20 minutes and have never lasted longer than half an hour until this episode in December last year. He has not had any recurrent migraine episodes since then. Update 05/20/2014 : He returns for follow-up after last visit in April 2015. He continues to do well without recurrent stroke or TIA symptoms. He remains on Plavix which is tolerating well without bleeding or bruising. He states his blood pressure is doing much better after recent medication change to by bystolic. His last hemoglobin A1c however was elevated at 8.1 done 4 months ago and a new diabetic medicine was added and repeat test is pending. His fasting sugars are in the 150s to postprandials are lower in the 120 range. He had lipid profile checked 4 months ago and it was fine. He has been using CPAP mask every  night regularly without fail. He has a loop recorder in place but so far atrial fibrillation has not been detected. He does walk a lot and watches his diet and has lost some weight. He has no complaints today. ROS:   14 system review of systems is  Negative for any complaints PMH:  Past Medical History  Diagnosis Date  . Anemia   .  Arthritis   . Diabetes mellitus   . Hyperlipidemia   . Hypertension   . Sleep apnea     cpap  . Gout   . Osteoarthritis of left hip 11/20/2012  . Localized osteoarthritis of left knee 11/20/2012  . Neuromuscular disorder     " mild periferal neauropathy  . PVC (premature ventricular contraction)   . Migraine headache with aura     Social History:  History   Social History  . Marital Status: Married    Spouse Name: Learta Codding    Number of Children: 4  . Years of Education: 23   Occupational History  . MD [employer]   Social History Main Topics  . Smoking status: Never Smoker   . Smokeless tobacco: Never Used  . Alcohol Use: No  . Drug Use: No  . Sexual Activity: Not on file   Other Topics Concern  . Not on file   Social History Narrative   Patient lives at home with family.   Caffeine Use: occasionally tea    Medications:   Current Outpatient Prescriptions on File Prior to Visit  Medication Sig Dispense Refill  . acetaminophen (TYLENOL) 650 MG CR tablet Take 1,300 mg by mouth every 8 (eight) hours as needed for pain.    [name] allopurinol (ZYLOPRIM) 300 MG tablet Take 1 tablet (300 mg total) by mouth daily. 90 tablet 3  . clopidogrel (PLAVIX) 75 MG tablet Take 1 tablet (75 mg total) by mouth daily with breakfast. 90 tablet 3  . losartan-hydrochlorothiazide (HYZAAR) 100-25 MG per tablet TAKE 1 TABLET BY MOUTH DAILY. 90 tablet 1  . metaxalone (SKELAXIN) 800 MG tablet Take 1 tablet (800 mg total) by mouth every 6 (six) hours as needed. 60 tablet 1  . metFORMIN (GLUMETZA) 1000 MG (MOD) 24 hr tablet Take 1 tablet (1,000 mg total) by mouth 2 (two) times daily with a meal. 180 tablet 1  . nebivolol (BYSTOLIC) 10 MG tablet Take 10 mg by mouth daily.    . ONGLYZA 5 MG TABS tablet TAKE 1 TABLET BY MOUTH ONCE DAILY 90 tablet 1  . rosuvastatin (CRESTOR) 5 MG tablet Take daily 90 tablet 3  . SM LANCETS 33G MISC USE TO TEST BLOOD SUGAR TWICE DAILY 400 each PRN  . TRUETEST TEST test strip  USE TO TEST TWICE DAILY 400 each PRN  . zolpidem (AMBIEN) 5 MG tablet Take 1 tablet (5 mg total) by mouth at bedtime as needed for sleep. 90 tablet 1   No current facility-administered medications on file prior to visit.    Allergies:   Allergies  Allergen Reactions  . Sulfa Antibiotics Other (See Comments)    Unknown reaction    Physical Exam General: well developed, well nourished middle aged Caucasian male, seated, in no evident distress Head: head normocephalic and atraumatic. Orohparynx benign   Musculoskeletal: no deformity Skin:  no rash/petichiae    Filed Vitals:   05/20/14 1036  BP: 124/64  Pulse: 74   Neurologic Exam Mental Status: Awake and fully alert. Oriented to place and time. Recent and remote memory intact. Attention  span, concentration and fund of knowledge appropriate. Mood and affect appropriate.  Cranial Nerves: Fundoscopic exam not done  . Pupils equal, briskly reactive to light. Extraocular movements full without nystagmus. Visual fields full to confrontation. Hearing intact. Facial sensation intact. Face, tongue, palate moves normally and symmetrically.  Motor: Normal bulk and tone. Normal strength in all tested extremity muscles. Sensory.: intact to touch and pinprick and vibratory sensation.  Coordination: Rapid alternating movements normal in all extremities. Finger-to-nose and heel-to-shin performed accurately bilaterally. Gait and Station: Arises from chair without difficulty. Stance is normal. Gait demonstrates normal stride length and balance . Able to heel, toe and tandem walk without difficulty.  Reflexes: 1+ and symmetric. Toes downgoing.      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 Vascular risk factors of hypertension,  diabetes, hyperlipidemia and sleep apnea.    PLAN:  I had a long discussion Dr Linna Darner with regards to risk of  recurrent stroke/TIA, aggressive risk factor reduction and answered questions. Continue Plavix for stroke prevention with strict control of hypertension with blood pressure goal below 130/90, lipids with LDL cholesterol goal below 70 mg percent and diabetes with hemoglobin A1c goal below 6.5%. Continue to use CPAP mask every night. Check follow-up carotid ultrasound study. Return for follow-up in one year or call earlier if necessary.     Note: This document was prepared with digital dictation and possible smart phrase technology. Any transcriptional errors that result from this process are unintentional

## 2014-05-21 ENCOUNTER — Ambulatory Visit (INDEPENDENT_AMBULATORY_CARE_PROVIDER_SITE_OTHER): Payer: 59

## 2014-05-21 DIAGNOSIS — I639 Cerebral infarction, unspecified: Secondary | ICD-10-CM

## 2014-05-22 ENCOUNTER — Other Ambulatory Visit: Payer: 59

## 2014-05-23 NOTE — Progress Notes (Signed)
I agree with the above plan 

## 2014-06-05 ENCOUNTER — Encounter: Payer: Self-pay | Admitting: *Deleted

## 2014-06-26 ENCOUNTER — Encounter: Payer: Self-pay | Admitting: *Deleted

## 2014-06-26 ENCOUNTER — Telehealth: Payer: Self-pay | Admitting: Internal Medicine

## 2014-06-26 NOTE — Telephone Encounter (Signed)
New problem    Pt wanted to know why are we still sending him reminder letter to get his device checked, b/c he gets it checked with Dr Wynonia Lawman. He doesn't want any more reminders.

## 2014-06-26 NOTE — Telephone Encounter (Signed)
Informed pt that I would update information in paceart.

## 2014-07-09 ENCOUNTER — Other Ambulatory Visit: Payer: Self-pay | Admitting: Emergency Medicine

## 2014-07-10 NOTE — Telephone Encounter (Signed)
Called in.

## 2014-07-11 ENCOUNTER — Other Ambulatory Visit: Payer: Self-pay | Admitting: Family Medicine

## 2014-07-11 ENCOUNTER — Other Ambulatory Visit (INDEPENDENT_AMBULATORY_CARE_PROVIDER_SITE_OTHER): Payer: 59

## 2014-07-11 DIAGNOSIS — I6340 Cerebral infarction due to embolism of unspecified cerebral artery: Secondary | ICD-10-CM | POA: Diagnosis not present

## 2014-07-11 DIAGNOSIS — E785 Hyperlipidemia, unspecified: Secondary | ICD-10-CM

## 2014-07-11 DIAGNOSIS — E119 Type 2 diabetes mellitus without complications: Secondary | ICD-10-CM

## 2014-07-11 DIAGNOSIS — I639 Cerebral infarction, unspecified: Secondary | ICD-10-CM

## 2014-07-11 LAB — COMPREHENSIVE METABOLIC PANEL
ALBUMIN: 4.3 g/dL (ref 3.5–5.2)
ALT: 13 U/L (ref 0–53)
AST: 12 U/L (ref 0–37)
Alkaline Phosphatase: 45 U/L (ref 39–117)
BUN: 28 mg/dL — AB (ref 6–23)
CO2: 24 meq/L (ref 19–32)
CREATININE: 1.11 mg/dL (ref 0.50–1.35)
Calcium: 9.9 mg/dL (ref 8.4–10.5)
Chloride: 101 mEq/L (ref 96–112)
GLUCOSE: 154 mg/dL — AB (ref 70–99)
Potassium: 4.2 mEq/L (ref 3.5–5.3)
Sodium: 136 mEq/L (ref 135–145)
Total Bilirubin: 0.5 mg/dL (ref 0.2–1.2)
Total Protein: 6.8 g/dL (ref 6.0–8.3)

## 2014-07-11 LAB — CBC WITH DIFFERENTIAL/PLATELET
BASOS ABS: 0 10*3/uL (ref 0.0–0.1)
BASOS PCT: 0 % (ref 0–1)
EOS ABS: 0.1 10*3/uL (ref 0.0–0.7)
Eosinophils Relative: 2 % (ref 0–5)
HCT: 43 % (ref 39.0–52.0)
Hemoglobin: 14.6 g/dL (ref 13.0–17.0)
Lymphocytes Relative: 29 % (ref 12–46)
Lymphs Abs: 1.6 10*3/uL (ref 0.7–4.0)
MCH: 30.4 pg (ref 26.0–34.0)
MCHC: 34 g/dL (ref 30.0–36.0)
MCV: 89.4 fL (ref 78.0–100.0)
MPV: 9 fL (ref 8.6–12.4)
Monocytes Absolute: 0.4 10*3/uL (ref 0.1–1.0)
Monocytes Relative: 8 % (ref 3–12)
NEUTROS ABS: 3.4 10*3/uL (ref 1.7–7.7)
Neutrophils Relative %: 61 % (ref 43–77)
PLATELETS: 288 10*3/uL (ref 150–400)
RBC: 4.81 MIL/uL (ref 4.22–5.81)
RDW: 13.1 % (ref 11.5–15.5)
WBC: 5.6 10*3/uL (ref 4.0–10.5)

## 2014-07-11 LAB — LIPID PANEL
Cholesterol: 135 mg/dL (ref 0–200)
HDL: 37 mg/dL — ABNORMAL LOW (ref >=40)
LDL Cholesterol: 53 mg/dL (ref 0–99)
Total CHOL/HDL Ratio: 3.6 Ratio
Triglycerides: 227 mg/dL — ABNORMAL HIGH (ref <150)
VLDL: 45 mg/dL — AB (ref 0–40)

## 2014-07-11 LAB — HEMOGLOBIN A1C
Hgb A1c MFr Bld: 7.6 % — ABNORMAL HIGH (ref <5.7)
Mean Plasma Glucose: 171 mg/dL — ABNORMAL HIGH (ref <117)

## 2014-07-17 ENCOUNTER — Encounter: Payer: Self-pay | Admitting: Family Medicine

## 2014-07-25 ENCOUNTER — Ambulatory Visit (INDEPENDENT_AMBULATORY_CARE_PROVIDER_SITE_OTHER): Payer: 59 | Admitting: Emergency Medicine

## 2014-07-25 ENCOUNTER — Other Ambulatory Visit: Payer: Self-pay | Admitting: Emergency Medicine

## 2014-07-25 VITALS — BP 124/76 | HR 64 | Temp 97.3°F | Resp 20 | Ht 72.0 in | Wt 224.4 lb

## 2014-07-25 DIAGNOSIS — Z1211 Encounter for screening for malignant neoplasm of colon: Secondary | ICD-10-CM

## 2014-07-25 DIAGNOSIS — N400 Enlarged prostate without lower urinary tract symptoms: Secondary | ICD-10-CM

## 2014-07-25 DIAGNOSIS — E119 Type 2 diabetes mellitus without complications: Secondary | ICD-10-CM | POA: Diagnosis not present

## 2014-07-25 DIAGNOSIS — Z23 Encounter for immunization: Secondary | ICD-10-CM

## 2014-07-25 LAB — MICROALBUMIN, URINE: MICROALB UR: 0.2 mg/dL (ref <2.0)

## 2014-07-25 LAB — IFOBT (OCCULT BLOOD): IMMUNOLOGICAL FECAL OCCULT BLOOD TEST: NEGATIVE

## 2014-07-25 MED ORDER — LOSARTAN POTASSIUM 100 MG PO TABS: 100.0000 mg | ORAL_TABLET | Freq: Every day | ORAL | Status: DC

## 2014-07-25 MED ORDER — DOXYCYCLINE HYCLATE 100 MG PO TABS: ORAL_TABLET | ORAL | Status: DC

## 2014-07-25 NOTE — Addendum Note (Signed)
Addended by: Arlyss Queen A on: 07/25/2014 11:32 AM   Modules accepted: Orders

## 2014-07-25 NOTE — Progress Notes (Signed)
Subjective:    Patient ID: Phillip [name], male    DOB: 1950/11/19, 64 y.o.   MRN: [id number]  HPI patient enters to follow-up hypertension and diabetes. He suffered a stroke involving the right parietal area and has done very well since that last episode. He recently started on Invokana and feels his sugars have improved since initiation of this medication. He continues under treatment for his obstructive sleep apnea.    Review of Systems  Constitutional: Negative.   HENT: Negative.   Eyes: Negative.   Respiratory: Negative.   Cardiovascular: Negative.   Gastrointestinal: Negative.   Endocrine:       Does have polyuria related to Invokana treatment. He checks his sugars regularly. His sugars tend to be high first thing in the morning  Genitourinary: Positive for difficulty urinating.  Allergic/Immunologic: Negative.   Neurological:       He has a history of a previous cerebrovascular accident. He has been stable from this.  Hematological: Negative.   Psychiatric/Behavioral: Negative.        Objective:   Physical Exam  Constitutional: He appears well-developed and well-nourished.  Eyes: Pupils are equal, round, and reactive to light.  Cardiovascular: Normal rate, regular rhythm and normal heart sounds.  Exam reveals no gallop.   No murmur heard. Pulmonary/Chest: Effort normal and breath sounds normal.  Abdominal: Soft.  Genitourinary:  The prostate is symmetrically enlarged. I did not feel any specific nodules  Musculoskeletal:  Patient is status post left hip and partial left knee replacement  Skin: Skin is warm and dry.  Psychiatric: He has a normal mood and affect. His behavior is normal. Judgment and thought content normal.   Results for orders placed or performed in visit on 07/11/14  CBC with Differential/Platelet  Result Value Ref Range   WBC 5.6 4.0 - 10.5 K/uL   RBC 4.81 4.22 - 5.81 MIL/uL   Hemoglobin 14.6 13.0 - 17.0 g/dL   HCT 43.0 39.0 - 52.0 %   MCV  89.4 78.0 - 100.0 fL   MCH 30.4 26.0 - 34.0 pg   MCHC 34.0 30.0 - 36.0 g/dL   RDW 13.1 11.5 - 15.5 %   Platelets 288 150 - 400 K/uL   MPV 9.0 8.6 - 12.4 fL   Neutrophils Relative % 61 43 - 77 %   Neutro Abs 3.4 1.7 - 7.7 K/uL   Lymphocytes Relative 29 12 - 46 %   Lymphs Abs 1.6 0.7 - 4.0 K/uL   Monocytes Relative 8 3 - 12 %   Monocytes Absolute 0.4 0.1 - 1.0 K/uL   Eosinophils Relative 2 0 - 5 %   Eosinophils Absolute 0.1 0.0 - 0.7 K/uL   Basophils Relative 0 0 - 1 %   Basophils Absolute 0.0 0.0 - 0.1 K/uL   Smear Review Criteria for review not met   Comprehensive metabolic panel  Result Value Ref Range   Sodium 136 135 - 145 mEq/L   Potassium 4.2 3.5 - 5.3 mEq/L   Chloride 101 96 - 112 mEq/L   CO2 24 19 - 32 mEq/L   Glucose, Bld 154 (H) 70 - 99 mg/dL   BUN 28 (H) 6 - 23 mg/dL   Creat 1.11 0.50 - 1.35 mg/dL   Total Bilirubin 0.5 0.2 - 1.2 mg/dL   Alkaline Phosphatase 45 39 - 117 U/L   AST 12 0 - 37 U/L   ALT 13 0 - 53 U/L   Total Protein 6.8 6.0 -  8.3 g/dL   Albumin 4.3 3.5 - 5.2 g/dL   Calcium 9.9 8.4 - 10.5 mg/dL  Hemoglobin A1c  Result Value Ref Range   Hgb A1c MFr Bld 7.6 (H) <5.7 %   Mean Plasma Glucose 171 (H) <117 mg/dL  Lipid panel  Result Value Ref Range   Cholesterol 135 0 - 200 mg/dL   Triglycerides 227 (H) <150 mg/dL   HDL 37 (L) >=40 mg/dL   Total CHOL/HDL Ratio 3.6 Ratio   VLDL 45 (H) 0 - 40 mg/dL   LDL Cholesterol 53 0 - 99 mg/dL         Assessment & Plan:  His hemoglobin A1c has improved. His BUN is up to 28. This is probably related to his Invokana treatment and dehydration. Plan to remove the diuretic from his blood pressure medication. Repeat labs in about 2 months with a bmet and do a PSA at that time. Overall patient is doing well on his current regimen. His triglycerides are high with low HDL related to genetics and his diabetes. He will be given a Prevnar today. Urine microalbumin was done along with a hematuria. He continues to see his  cardiologist every 6 months.

## 2014-07-26 LAB — PSA, MEDICARE: PSA: 2.09 ng/mL (ref <=4.00)

## 2014-08-04 ENCOUNTER — Telehealth: Payer: Self-pay | Admitting: Radiology

## 2014-08-04 DIAGNOSIS — M1000 Idiopathic gout, unspecified site: Secondary | ICD-10-CM

## 2014-08-04 NOTE — Telephone Encounter (Signed)
Pt needs refills of Allopurinol 300 and Metformin 1000. 90 day supply. thanks

## 2014-08-05 ENCOUNTER — Other Ambulatory Visit: Payer: Self-pay | Admitting: Emergency Medicine

## 2014-08-05 ENCOUNTER — Telehealth: Payer: Self-pay | Admitting: Radiology

## 2014-08-05 DIAGNOSIS — M1000 Idiopathic gout, unspecified site: Secondary | ICD-10-CM

## 2014-08-05 MED ORDER — ALLOPURINOL 300 MG PO TABS: 300.0000 mg | ORAL_TABLET | Freq: Every day | ORAL | Status: DC

## 2014-08-05 MED ORDER — METFORMIN HCL ER (MOD) 1000 MG PO TB24: 1000.0000 mg | ORAL_TABLET | Freq: Two times a day (BID) | ORAL | Status: DC

## 2014-08-05 NOTE — Telephone Encounter (Signed)
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 Thanks!

## 2014-08-05 NOTE — Addendum Note (Signed)
Addended by: Elwyn Reach A on: 08/05/2014 05:33 PM   Modules accepted: Orders

## 2014-08-05 NOTE — Telephone Encounter (Signed)
Prescription sent

## 2014-08-05 NOTE — Telephone Encounter (Signed)
Sent in Liberal and notified pt through TL.

## 2014-08-06 ENCOUNTER — Other Ambulatory Visit: Payer: Self-pay | Admitting: Emergency Medicine

## 2014-10-30 ENCOUNTER — Other Ambulatory Visit: Payer: Self-pay

## 2014-10-30 DIAGNOSIS — M1000 Idiopathic gout, unspecified site: Secondary | ICD-10-CM

## 2014-10-30 MED ORDER — CLOPIDOGREL BISULFATE 75 MG PO TABS: 75.0000 mg | ORAL_TABLET | Freq: Every day | ORAL | Status: DC

## 2014-10-30 MED ORDER — ONGLYZA 5 MG PO TABS: 5.0000 mg | ORAL_TABLET | Freq: Every day | ORAL | Status: DC

## 2014-10-30 MED ORDER — ALLOPURINOL 300 MG PO TABS: 300.0000 mg | ORAL_TABLET | Freq: Every day | ORAL | Status: DC

## 2014-10-30 MED ORDER — METFORMIN HCL ER (MOD) 1000 MG PO TB24
1000.0000 mg | ORAL_TABLET | Freq: Two times a day (BID) | ORAL | Status: DC
Start: 2014-10-30 — End: 2015-07-16

## 2014-10-30 NOTE — Telephone Encounter (Signed)
Dr Linna Darner called and req'd I send in RFs of chronic meds Allopurinol, clopidogrel, metformin and onglyza. He stated that he will f/up w/ Dr Everlene Farrier sometime w/in the month, but wondered if he could get 90 day supplies w/ a couple of RFs. I have pended them as such but, Dr Everlene Farrier, they need your approval for this # of RFs.

## 2014-11-10 ENCOUNTER — Encounter: Payer: Self-pay | Admitting: Cardiology

## 2014-11-10 DIAGNOSIS — Z8673 Personal history of transient ischemic attack (TIA), and cerebral infarction without residual deficits: Secondary | ICD-10-CM

## 2014-11-10 DIAGNOSIS — I472 Ventricular tachycardia, unspecified: Secondary | ICD-10-CM | POA: Insufficient documentation

## 2014-11-10 DIAGNOSIS — I2510 Atherosclerotic heart disease of native coronary artery without angina pectoris: Secondary | ICD-10-CM | POA: Insufficient documentation

## 2014-11-10 NOTE — Progress Notes (Signed)
Patient ID: Phillip [name], male   DOB: November 04, 1950, 64 y.o.   MRN: [id number]  [place], [place]    Date of visit:  11/10/2014 DOB:  1951-01-17    Age:  64 yrs. Medical record number:  [id number]     Account number:  [id number] Primary Care Provider: Arlyss Queen A ____________________________ CURRENT DIAGNOSES  1. CAD Native without angina  2. Encounter for adjustment and management of other cardiac device  3. Hyperlipidemia  4. Obesity  5. Type 2 diabetes mellitus without complications  6. Essential (primary) hypertension  7. Ventricular tachycardia ____________________________ ALLERGIES  Sulfa (Sulfonamides), Intolerance-unknown ____________________________ MEDICATIONS  1. ibuprofen 200 mg tablet, PRN  2. allopurinol 300 mg tablet, 1 p.o. daily  3. metformin 1,000 mg tablet, BID  4. Onglyza 5 mg tablet, 1 p.o. daily  5. clopidogrel 75 mg tablet, 1 p.o. daily  6. Ambien 5 mg tablet, QHS  7. Bystolic 10 mg tablet, 1 p.o. daily  8. Invokana 300 mg tablet, 1 p.o. daily  9. losartan 100 mg tablet, 1 p.o. daily  10. Crestor 10 mg tablet, QOD ____________________________ HISTORY OF PRESENT ILLNESS Patient seen for cardiac followup. He has been doing well since he was previously here. He is exercising on a regular basis and his lipids were reviewed today and show good control. He denies angina and has no PND, orthopnea, syncope, palpitations, or claudication. He has had no atrial fibrillation on his loop monitor that we have been checking. ____________________________ PAST HISTORY  Past Medical Illnesses:  hypertension, DM-non-insulin dependent, obesity, history of migraine headaches, E. Coli sepsis 1998 with acute cholecystitis, sleep apnea, gout, prior CVA 12/14;  Cardiovascular Illnesses:  RBBB, CAD;  Surgical Procedures:  cholecystectomy (lap), knee surgery, left, tonsillectomy, vasectomy, hip surgery;  NYHA Classification:  I;  [place] Angina Classification:  Class 0: Asymptomatic;  Cardiology  Procedures-Invasive:  cardiac cath (left);  Cardiology Procedures-Noninvasive:  stress echocardiogram, treadmill cardiolite May 2009, treadmill January 2010, treadmill August 2013, event monitor June 2014, echocardiogram July 2014;  Cardiac Cath Results:  normal Left main, ecctatic proximal LAD, 20 % stenosis proximal LAD, normal CFX, scattered irregularities RCA;  LVEF of 60% documented via echocardiogram on 10/31/2012,   ____________________________ CARDIO-PULMONARY TEST DATES EKG Date:  10/15/2012;   Cardiac Cath Date:  09/30/2007;  Holter/Event Monitor Date: 10/15/2012;  Nuclear Study Date:  09/12/2007;  Echocardiography Date: 10/31/2012;  Chest Xray Date: 09/20/2007;   ____________________________ FAMILY HISTORY Brother -- Brother alive with problem, Diabetes mellitus, Hypertension Father -- Father dead, Pulmonary embolism Mother -- Mother alive with problem, Diabetes mellitus Sister -- Sister alive and well Sister -- Diabetes mellitus, Sister alive with problem ____________________________ SOCIAL HISTORY Alcohol Use:  no alcohol use;  Smoking:  never smoked;  Diet:  regular diet;  Lifestyle:  married and 4 children;  Exercise:  walking for approximately 45 minutes 6 days per week;  Occupation:  physician;  Residence:  lives with wife;   ____________________________ REVIEW OF SYSTEMS General:  weight loss of approximately 10 lbs Eyes: wears eye glasses/contact lenses Respiratory: denies dyspnea, cough, wheezing or hemoptysis. Cardiovascular:  please review HPI Abdominal: denies dyspepsia, GI bleeding, constipation, or diarrhea Musculoskeletal:  mild arthritis  ____________________________ PHYSICAL EXAMINATION VITAL SIGNS  Blood Pressure:  134/72 Sitting, Right arm, regular cuff  , 130/70 Standing, Right arm and regular cuff   Pulse:  64/min. Weight:  231.00 lbs. Height:  73"BMI: 30  Constitutional:  pleasant white male in no acute distress, mildly obese Skin:  warm and dry  to touch, no  apparent skin lesions, or masses noted. Head:  normocephalic, normal hair pattern, no masses or tenderness ENT:  ears, nose and throat reveal no gross abnormalities.  Dentition good. Neck:  supple, without massess. No JVD, thyromegaly or carotid bruits. Carotid upstroke normal. Chest:  normal symmetry, clear to auscultation. Cardiac:  regular rhythm, normal S1 and S2, No S3 or S4, no murmurs, gallops or rubs detected. Peripheral Pulses:  the femoral,dorsalis pedis, and posterior tibial pulses are full and equal bilaterally with no bruits auscultated. Extremities & Back:  no deformities, clubbing, cyanosis, erythema or edema observed. Normal muscle strength and tone. Neurological:  no gross motor or sensory deficits noted, affect appropriate, oriented x3. ____________________________ MOST RECENT LIPID PANEL 07/11/14  CHOL TOTL 135 mg/dl, LDL 53 NM, HDL 37 mg/dl, TRIGLYCER 227 mg/dl and CHOL/HDL 3.6 (Calc) ____________________________ IMPRESSIONS/PLAN  1. Prior history of stroke without residual 2. Mild coronary artery disease 3. Hyperlipidemia under treatment 4. No evidence of atrial fibrillation on monitoring  Recommendations:  Followup in 6 months. Discussed some weight loss. Call if problems. ____________________________ TODAYS ORDERS  1. Return Visit: 6 months  2. 12 Lead EKG: 6 months                       ____________________________ Cardiology Physician:  Kerry Hough MD [facility]

## 2014-12-30 LAB — HM DIABETES EYE EXAM

## 2015-01-23 ENCOUNTER — Other Ambulatory Visit: Payer: Self-pay | Admitting: Emergency Medicine

## 2015-01-23 MED ORDER — ZOLPIDEM TARTRATE 5 MG PO TABS: ORAL_TABLET | ORAL | Status: DC

## 2015-01-26 ENCOUNTER — Other Ambulatory Visit: Payer: Self-pay | Admitting: Emergency Medicine

## 2015-02-03 ENCOUNTER — Encounter: Payer: Self-pay | Admitting: Emergency Medicine

## 2015-02-17 ENCOUNTER — Ambulatory Visit (INDEPENDENT_AMBULATORY_CARE_PROVIDER_SITE_OTHER): Payer: 59 | Admitting: Emergency Medicine

## 2015-02-17 ENCOUNTER — Ambulatory Visit (INDEPENDENT_AMBULATORY_CARE_PROVIDER_SITE_OTHER): Payer: 59

## 2015-02-17 ENCOUNTER — Other Ambulatory Visit: Payer: 59

## 2015-02-17 VITALS — BP 110/74 | HR 65 | Temp 98.0°F | Resp 18 | Ht 71.75 in | Wt 227.8 lb

## 2015-02-17 DIAGNOSIS — M545 Low back pain, unspecified: Secondary | ICD-10-CM

## 2015-02-17 DIAGNOSIS — E1340 Other specified diabetes mellitus with diabetic neuropathy, unspecified: Secondary | ICD-10-CM

## 2015-02-17 DIAGNOSIS — G8929 Other chronic pain: Secondary | ICD-10-CM

## 2015-02-17 DIAGNOSIS — G4733 Obstructive sleep apnea (adult) (pediatric): Secondary | ICD-10-CM | POA: Diagnosis not present

## 2015-02-17 DIAGNOSIS — Z9989 Dependence on other enabling machines and devices: Secondary | ICD-10-CM

## 2015-02-17 DIAGNOSIS — E119 Type 2 diabetes mellitus without complications: Secondary | ICD-10-CM

## 2015-02-17 DIAGNOSIS — E785 Hyperlipidemia, unspecified: Secondary | ICD-10-CM

## 2015-02-17 DIAGNOSIS — I10 Essential (primary) hypertension: Secondary | ICD-10-CM

## 2015-02-17 LAB — COMPLETE METABOLIC PANEL WITH GFR
ALK PHOS: 51 U/L (ref 40–115)
ALT: 14 U/L (ref 9–46)
AST: 14 U/L (ref 10–35)
Albumin: 4.3 g/dL (ref 3.6–5.1)
BUN: 24 mg/dL (ref 7–25)
CALCIUM: 9.9 mg/dL (ref 8.6–10.3)
CO2: 26 mmol/L (ref 20–31)
CREATININE: 0.96 mg/dL (ref 0.70–1.25)
Chloride: 105 mmol/L (ref 98–110)
GFR, Est Non African American: 82 mL/min (ref >=60)
Glucose, Bld: 167 mg/dL — ABNORMAL HIGH (ref 65–99)
Potassium: 4.6 mmol/L (ref 3.5–5.3)
Sodium: 137 mmol/L (ref 135–146)
TOTAL PROTEIN: 6.7 g/dL (ref 6.1–8.1)
Total Bilirubin: 0.4 mg/dL (ref 0.2–1.2)

## 2015-02-17 LAB — HEMOGLOBIN A1C: Hgb A1c MFr Bld: 7.3 % — AB (ref 4.0–6.0)

## 2015-02-17 LAB — GLUCOSE, POCT (MANUAL RESULT ENTRY): POC Glucose: 165 mg/dl — AB (ref 70–99)

## 2015-02-17 LAB — LIPID PANEL
CHOL/HDL RATIO: 3.5 ratio (ref <=5.0)
CHOLESTEROL: 131 mg/dL (ref 125–200)
HDL: 37 mg/dL — AB (ref >=40)
LDL Cholesterol: 34 mg/dL (ref <130)
TRIGLYCERIDES: 300 mg/dL — AB (ref <150)
VLDL: 60 mg/dL — ABNORMAL HIGH (ref <30)

## 2015-02-17 LAB — POCT GLYCOSYLATED HEMOGLOBIN (HGB A1C): Hemoglobin A1C: 7.3

## 2015-02-17 MED ORDER — BLOOD GLUCOSE MONITOR KIT: PACK | Status: DC

## 2015-02-17 NOTE — Progress Notes (Signed)
Chief Complaint:  Chief Complaint  Patient presents with  . Diabetes  . Back Pain  . Sleep Apnea    HPI: Phillip [name] is a 64 y.o. male who is here for follow-up of his diabetes. He is started on in the, and feels his sugars are better. He does have a neuropathy on the bottom of his feet. He is interested in a potential change of his diabetes medication. He is interested in possibly coming off of Onglyza and trying there once a week injectable bydurion. Problem #2 has been recent increase in back pain. He is known to have degenerative disc disease in his back following an injury as a child he recently has been waking up in the morning with significant back pain and tightness. He denies radicular symptoms. Problem #3. Patient has not had a sleep study for tended 12 years. He has been on the same settings for his CPAP machine. He is interested in a reevaluation of this. Problem #4 neuropathy. He has long-standing sensory neuropathy of the feet. This has been progressive over the years but not associated with significant pain all leg numbness on the bottom of his feet.   Past Medical History  Diagnosis Date  . Anemia   . Arthritis   . Diabetes mellitus   . Hyperlipidemia   . Hypertension   . Sleep apnea     cpap  . Gout   . Osteoarthritis of left hip 11/20/2012  . Localized osteoarthritis of left knee 11/20/2012  . Neuromuscular disorder ([place])     " mild periferal neauropathy  . PVC (premature ventricular contraction)   . Migraine headache with aura    Past Surgical History  Procedure Laterality Date  . Cholecystectomy  1997  . Knee arthoscopy  2006    Left Knee  . Tonsillectomy and adenoidectomy      Childhood  . Total hip arthroplasty Left 11/20/2012    Dr Mardelle Matte  . Replacement unicondylar joint knee Left 11/20/2012    Dr Mardelle Matte  . Partial knee arthroplasty Left 11/20/2012    Procedure: UNICOMPARTMENTAL KNEE;  Surgeon: Johnny Bridge, MD;  Location: [facility];   Service: Orthopedics;  Laterality: Left;  . Total hip arthroplasty Left 11/20/2012    Procedure: TOTAL HIP ARTHROPLASTY;  Surgeon: Johnny Bridge, MD;  Location: [facility];  Service: Orthopedics;  Laterality: Left;  . Tee without cardioversion N/A 04/19/2013    Procedure: TRANSESOPHAGEAL ECHOCARDIOGRAM (TEE);  Surgeon: Larey Dresser, MD;  Location: [facility];  Service: Cardiovascular;  Laterality: N/A;  . Cva      CVA, occiptal  . Cva      CVA, occipital  . Left total hip    . Left unicarpmental knee    . Loop recorder implant N/A 04/19/2013    Procedure: LOOP RECORDER IMPLANT;  Surgeon: Evans Lance, MD;  Location: [facility] CATH LAB;  Service: Cardiovascular;  Laterality: N/A;   Social History   Social History  . Marital Status: Married    Spouse Name: Learta Codding  . Number of Children: 4  . 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 Smoking status: Never Smoker   . Smokeless tobacco: Never Used  . Alcohol Use: No  . Drug Use: No  . Sexual Activity: Not Asked   Other Topics Concern  . None   Social History Narrative   Patient lives at home with  family.   Caffeine Use: occasionally tea   Family History  Problem Relation Age of Onset  . Cancer Mother     Breast  . Stroke Mother   . Stroke Maternal Grandmother   . Stroke Maternal Aunt   . Stroke Maternal Uncle    Allergies  Allergen Reactions  . Sulfa Antibiotics Other (See Comments)    Unknown reaction   Prior to Admission medications   Medication Sig Start Date End Date Taking? Authorizing Provider  acetaminophen (TYLENOL) 650 MG CR tablet Take 1,300 mg by mouth every 8 (eight) hours as needed for pain.   Yes Historical Provider, MD  allopurinol (ZYLOPRIM) 300 MG tablet Take 1 tablet (300 mg total) by mouth daily. 10/30/14  Yes Darlyne Russian, MD  canagliflozin (INVOKANA) 300 MG TABS tablet Take 300 mg by mouth daily before breakfast.   Yes Historical Provider, MD    clopidogrel (PLAVIX) 75 MG tablet Take 1 tablet (75 mg total) by mouth daily with breakfast. 10/30/14  Yes Darlyne Russian, MD  doxycycline (VIBRA-TABS) 100 MG tablet One a day 07/25/14  Yes Darlyne Russian, MD  losartan (COZAAR) 100 MG tablet Take 1 tablet (100 mg total) by mouth daily. 07/25/14  Yes Darlyne Russian, MD  metaxalone (SKELAXIN) 800 MG tablet Take 1 tablet (800 mg total) by mouth every 6 (six) hours as needed. 11/23/12  Yes Kirstin Shepperson, PA-C  metFORMIN (GLUMETZA) 1000 MG (MOD) 24 hr tablet Take 1 tablet (1,000 mg total) by mouth 2 (two) times daily with a meal. 10/30/14  Yes Darlyne Russian, MD  nebivolol (BYSTOLIC) 10 MG tablet Take 10 mg by mouth daily.   Yes Historical Provider, MD  ONGLYZA 5 MG TABS tablet Take 1 tablet (5 mg total) by mouth daily. 10/30/14  Yes Darlyne Russian, MD  rosuvastatin (CRESTOR) 5 MG tablet Take daily 10/24/13  Yes Darlyne Russian, MD  SM LANCETS 33G MISC USE TO TEST BLOOD SUGAR TWICE DAILY 01/24/13  Yes Darlyne Russian, MD  TRUETEST TEST test strip USE TO TEST TWICE DAILY 01/24/13  Yes Darlyne Russian, MD  zolpidem (AMBIEN) 5 MG tablet TAKE 1 TABLET BY MOUTH ONCE DAILY AT BEDTIME AS NEEDED FOR SLEEP 01/23/15  Yes Darlyne Russian, MD  blood glucose meter kit and supplies KIT Dispense based on patient and insurance preference. Use up to four times daily as directed. (FOR ICD-9 250.00, 250.01).Dispence a true metrix glucmeter 02/17/15   Darlyne Russian, MD     ROS: The patient denies fevers, chills, night sweats, unintentional weight loss, chest pain, palpitations, wheezing, dyspnea on exertion, nausea, vomiting, abdominal pain, dysuria, hematuria, melena, numbness, weakness, or tingling.   All other systems have been reviewed and were otherwise negative with the exception of those mentioned in the HPI and as above.    PHYSICAL EXAM: Filed Vitals:   02/17/15 1511  BP: 110/74  Pulse: 65  Temp: 98 F (36.7 C)  Resp: 18   Filed Vitals:   02/17/15 1511  Height: 5'  11.75" (1.822 m)  Weight: 227 lb 12.8 oz (103.329 kg)   Body mass index is 31.13 kg/(m^2).   General: Alert, no acute distress HEENT:  Normocephalic, atraumatic, oropharynx patent. EOMI, PERRLA Cardiovascular:  Regular rate and rhythm, no rubs murmurs or gallops.  No Carotid bruits, radial pulse intact. No pedal edema.  Respiratory: Clear to auscultation bilaterally.  No wheezes, rales, or rhonchi.  No cyanosis, no use of accessory musculature GI:  No organomegaly, abdomen is soft and non-tender, positive bowel sounds.  No masses. Skin: No rashes. Neurologic: Facial musculature symmetric. Psychiatric: Patient is appropriate throughout our interaction. Lymphatic: No cervical lymphadenopathy Musculoskeletal: Gait intact. Deep tendon reflexes knees are 2+ left ankle 2+ right ankle THERE is no weakness noted on exam. There is decreased range of motion flexion extension of the back.   LABS: Results for orders placed or performed in visit on 02/17/15  Lipid panel  Result Value Ref Range   Cholesterol 131 125 - 200 mg/dL   Triglycerides 300 (H) <150 mg/dL   HDL 37 (L) >=40 mg/dL   Total CHOL/HDL Ratio 3.5 <=5.0 Ratio   VLDL 60 (H) <30 mg/dL   LDL Cholesterol 34 <130 mg/dL  COMPLETE METABOLIC PANEL WITH GFR  Result Value Ref Range   Sodium 137 135 - 146 mmol/L   Potassium 4.6 3.5 - 5.3 mmol/L   Chloride 105 98 - 110 mmol/L   CO2 26 20 - 31 mmol/L   Glucose, Bld 167 (H) 65 - 99 mg/dL   BUN 24 7 - 25 mg/dL   Creat 0.96 0.70 - 1.25 mg/dL   Total Bilirubin 0.4 0.2 - 1.2 mg/dL   Alkaline Phosphatase 51 40 - 115 U/L   AST 14 10 - 35 U/L   ALT 14 9 - 46 U/L   Total Protein 6.7 6.1 - 8.1 g/dL   Albumin 4.3 3.6 - 5.1 g/dL   Calcium 9.9 8.6 - 10.3 mg/dL   GFR, Est African American >89 >=60 mL/min   GFR, Est Non African American 82 >=60 mL/min  POCT glycosylated hemoglobin (Hb A1C)  Result Value Ref Range   Hemoglobin A1C 7.3   POCT glucose (manual entry)  Result Value Ref Range   POC  Glucose 165 (A) 70 - 99 mg/dl     EKG/XRAY:   Primary read interpreted by Dr. Everlene Farrier at [facility] there is a compression deformity of L2. There is retrolisthesis at L2-L3. It appears that T11-L1 are fused..   ASSESSMENT/PLAN: 1. Other specified diabetes mellitus with diabetic neuropathy ([place]) We'll consider changed by during and when patient returned from his trip to the [place].  2. Chronic LBP X-ray show significant changes. We'll await the radiology reading. We may need to proceed with MRI. - DG Lumbar Spine Complete; Future  3.OSA Repeat sleep study ordered. 4. Neuropathy Patient not interested in trying gabapentin or Lyrica at present. We'll keep this issue open.  Gross sideeffects, risk and benefits, and alternatives of medications d/w patient. Patient is aware that all medications have potential sideeffects and we are unable to predict every sideeffect or drug-drug interaction that may occur. Jenny Reichmann M.D.  $Rem'@MEC'NLDQ$ @ 02/17/2015 4:08 PM

## 2015-02-18 ENCOUNTER — Other Ambulatory Visit: Payer: Self-pay

## 2015-02-18 DIAGNOSIS — M549 Dorsalgia, unspecified: Secondary | ICD-10-CM | POA: Insufficient documentation

## 2015-02-18 MED ORDER — BLOOD GLUCOSE MONITOR KIT: PACK | Status: DC

## 2015-02-27 ENCOUNTER — Encounter: Payer: Self-pay | Admitting: Family Medicine

## 2015-03-16 ENCOUNTER — Other Ambulatory Visit: Payer: Self-pay | Admitting: Physician Assistant

## 2015-03-17 NOTE — Telephone Encounter (Signed)
Is patient still on Invokana?

## 2015-04-08 ENCOUNTER — Institutional Professional Consult (permissible substitution): Payer: 59 | Admitting: Neurology

## 2015-04-19 ENCOUNTER — Other Ambulatory Visit: Payer: Self-pay | Admitting: *Deleted

## 2015-04-19 MED ORDER — ROSUVASTATIN CALCIUM 5 MG PO TABS: ORAL_TABLET | ORAL | Status: DC

## 2015-04-22 ENCOUNTER — Institutional Professional Consult (permissible substitution): Payer: 59 | Admitting: Neurology

## 2015-04-23 ENCOUNTER — Ambulatory Visit (INDEPENDENT_AMBULATORY_CARE_PROVIDER_SITE_OTHER): Payer: 59 | Admitting: Neurology

## 2015-04-23 ENCOUNTER — Encounter: Payer: Self-pay | Admitting: Neurology

## 2015-04-23 VITALS — BP 138/82 | HR 74 | Resp 16 | Ht 71.75 in | Wt 233.5 lb

## 2015-04-23 DIAGNOSIS — E1149 Type 2 diabetes mellitus with other diabetic neurological complication: Secondary | ICD-10-CM

## 2015-04-23 DIAGNOSIS — E1140 Type 2 diabetes mellitus with diabetic neuropathy, unspecified: Secondary | ICD-10-CM | POA: Diagnosis not present

## 2015-04-23 DIAGNOSIS — I63113 Cerebral infarction due to embolism of bilateral vertebral arteries: Secondary | ICD-10-CM | POA: Diagnosis not present

## 2015-04-23 DIAGNOSIS — Z9989 Dependence on other enabling machines and devices: Secondary | ICD-10-CM

## 2015-04-23 DIAGNOSIS — E1311 Other specified diabetes mellitus with ketoacidosis with coma: Secondary | ICD-10-CM | POA: Diagnosis not present

## 2015-04-23 DIAGNOSIS — G4733 Obstructive sleep apnea (adult) (pediatric): Secondary | ICD-10-CM | POA: Diagnosis not present

## 2015-04-23 DIAGNOSIS — G43109 Migraine with aura, not intractable, without status migrainosus: Secondary | ICD-10-CM | POA: Diagnosis not present

## 2015-04-23 NOTE — Progress Notes (Signed)
SLEEP MEDICINE CLINIC   Provider:  Larey Seat, M D  Referring Provider: Darlyne Russian, MD Primary Care Physician:  Jenny Reichmann, MD  Chief Complaint  Patient presents with  . Sleep Apnea    Sts. has used CPAP for the last 11 yrs.  Most recently treated by Dr. Brayton Caves in [place].  Last sleep study was 11 yrs. ago--he tried to get a copy from Dr. Joneen Boers but was told they only keep those studies for 7 yrs.  Sts. current CPAP machine is about 8 yrs. old--he would like to replace it.  He has had no lapse in CPAP therapy.  He gets his supplies thru [facility]./fim    HPI:  Phillip [name] is a 64 y.o. male , seen here as a referral from Dr. Everlene Farrier for OSA , transfer of sleep care. The patient suffered an occipital stroke in 2014. He has a long history of migraines.  He had a last sleep study in [place], by Dr. Scarlette Calico. Phillip [name] suffered the occipital stroke realizing that he had a similar visual aura to a migraine manifestation. This remains worrisome for him as he should not take Triptan- medication. After his presumed visual aura persisted for about 14 hours he was seen by one of his colleagues in the same office, Dr. Arlyss Queen, and initiated a stroke workup. Phillip [name] also suffers from diabetes and has a diabetic induced neuropathy on the bottom of both feet. He is still taking Onglyza,. He is seen today because he had not had a repeat sleep study for the last 12 years. He has been on the same setting and on the same type CPAP machine. He is interested in reevaluating his sleep apnea and treatment options. He recalls that about 8 years ago he was on a medical mission and used his original CPAP machine on a car battery and it broke. He replaced the  the same type however at the same settings.   Sleep habits are as follows:  He usually is in bed by around midnight falls asleep promptly and rises at 7 AM. He does work irregular hours at urgent  care. Some nights he will not be home earlier than 11 other nights he will have to wake up earlier to do an early shift. He sleeps usually on one pillow while in supine position on 2 pillows if on the side. Bedroom is cool, quiet and dark.  The patient will have at least one nocturia break at night around 5 AM and he usually can go back to sleep. He states that he rarely is able to recall a dream and that he seems to dream infrequently. No vivid dreams and no dream enactment. No history of sleep paralysis, dream intrusion, hypnagogic hallucinations. He estimates that he sleeps the first 70% of the night in supine position but depending on back pain he may have to change to the side. His wife has not reported breakthrough snoring but has noted occasional air leaking by mouth. He has noted that he has a very dry mouth when he wakes up if he had or leakage. He feels refreshed and restored when he wakes up. He has noted a great improvement in comparison to prior to CPAP use and does no longer sleep well if the CPAP is not available.   His Epworth sleepiness score is endorsed at only 5 points fatigue severity at only 18 points and the geriatric depression score endorsed at  0.  Sleep medical history and family sleep history: Brother on CPAP, his "sister should be".   Social history: married, no tobacco use ever, no ETOH , caffeine user - plain iced tea , 3-4 glasses a day.   Review of Systems: Out of a complete 14 system review, the patient complains of only the following symptoms, and all other reviewed systems are negative.  Epworth score 5 on CPAP  , Fatigue severity score 18  , depression score; none.   Social History   Social History  . Marital Status: Married    Spouse Name: Learta Codding  . Number of Children: 4  . 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 Smoking status: Never Smoker   . Smokeless tobacco: Never Used  . Alcohol Use: No  .  Drug Use: No  . Sexual Activity: Not on file   Other Topics Concern  . Not on file   Social History Narrative   Patient lives at home with family.   Caffeine Use: occasionally tea    Family History  Problem Relation Age of Onset  . Cancer Mother     Breast  . Stroke Mother   . Stroke Maternal Grandmother   . Stroke Maternal Aunt   . Stroke Maternal Uncle     Past Medical History  Diagnosis Date  . Anemia   . Arthritis   . Diabetes mellitus   . Hyperlipidemia   . Hypertension   . Sleep apnea     cpap  . Gout   . Osteoarthritis of left hip 11/20/2012  . Localized osteoarthritis of left knee 11/20/2012  . Neuromuscular disorder ([place])     " mild periferal neauropathy  . PVC (premature ventricular contraction)   . Migraine headache with aura     Past Surgical History  Procedure Laterality Date  . Cholecystectomy  1997  . Knee arthoscopy  2006    Left Knee  . Tonsillectomy and adenoidectomy      Childhood  . Total hip arthroplasty Left 11/20/2012    Dr Mardelle Matte  . Replacement unicondylar joint knee Left 11/20/2012    Dr Mardelle Matte  . Partial knee arthroplasty Left 11/20/2012    Procedure: UNICOMPARTMENTAL KNEE;  Surgeon: Johnny Bridge, MD;  Location: [facility];  Service: Orthopedics;  Laterality: Left;  . Total hip arthroplasty Left 11/20/2012    Procedure: TOTAL HIP ARTHROPLASTY;  Surgeon: Johnny Bridge, MD;  Location: [facility];  Service: Orthopedics;  Laterality: Left;  . Tee without cardioversion N/A 04/19/2013    Procedure: TRANSESOPHAGEAL ECHOCARDIOGRAM (TEE);  Surgeon: Larey Dresser, MD;  Location: [facility];  Service: Cardiovascular;  Laterality: N/A;  . Cva      CVA, occiptal  . Cva      CVA, occipital  . Left total hip    . Left unicarpmental knee    . Loop recorder implant N/A 04/19/2013    Procedure: LOOP RECORDER IMPLANT;  Surgeon: Evans Lance, MD;  Location: [facility] CATH LAB;  Service: Cardiovascular;  Laterality: N/A;    Current Outpatient Prescriptions   Medication Sig Dispense Refill  . acetaminophen (TYLENOL) 650 MG CR tablet Take 1,300 mg by mouth every 8 (eight) hours as needed for pain.    [name] allopurinol (ZYLOPRIM) 300 MG tablet Take 1 tablet (300 mg total) by mouth daily. 90 tablet 2  . blood glucose meter kit and supplies KIT Use up to four  times daily as directed. Dx E13.40. 1 each 0  . clopidogrel (PLAVIX) 75 MG tablet Take 1 tablet (75 mg total) by mouth daily with breakfast. 90 tablet 2  . INVOKANA 300 MG TABS tablet TAKE 1 TABLET BY MOUTH ONCE DAILY 90 tablet 0  . losartan (COZAAR) 100 MG tablet Take 1 tablet (100 mg total) by mouth daily. 90 tablet 3  . metFORMIN (GLUMETZA) 1000 MG (MOD) 24 hr tablet Take 1 tablet (1,000 mg total) by mouth 2 (two) times daily with a meal. 180 tablet 2  . nebivolol (BYSTOLIC) 10 MG tablet Take 10 mg by mouth daily.    . ONGLYZA 5 MG TABS tablet Take 1 tablet (5 mg total) by mouth daily. 90 tablet 2  . rosuvastatin (CRESTOR) 5 MG tablet Take 1 tab daily 90 tablet 3  . SM LANCETS 33G MISC USE TO TEST BLOOD SUGAR TWICE DAILY 400 each PRN  . TRUETEST TEST test strip USE TO TEST TWICE DAILY 400 each PRN  . zolpidem (AMBIEN) 5 MG tablet TAKE 1 TABLET BY MOUTH ONCE DAILY AT BEDTIME AS NEEDED FOR SLEEP 90 tablet 1  . doxycycline (VIBRA-TABS) 100 MG tablet One a day (Patient not taking: Reported on 04/23/2015) 90 tablet 1  . metaxalone (SKELAXIN) 800 MG tablet Take 1 tablet (800 mg total) by mouth every 6 (six) hours as needed. (Patient not taking: Reported on 04/23/2015) 60 tablet 1   No current facility-administered medications for this visit.    Allergies as of 04/23/2015 - Review Complete 04/23/2015  Allergen Reaction Noted  . Sulfa antibiotics Other (See Comments) 06/01/2011    Vitals: BP 138/82 mmHg  Pulse 74  Resp 16  Ht 5' 11.75" (1.822 m)  Wt 233 lb 8 oz (105.915 kg)  BMI 31.91 kg/m2 Last Weight:  Wt Readings from Last 1 Encounters:  04/23/15 233 lb 8 oz (105.915 kg)   ESL:PNPY mass  index is 31.91 kg/(m^2).     Last Height:   Ht Readings from Last 1 Encounters:  04/23/15 5' 11.75" (1.822 m)    Physical exam:  General: The patient is awake, alert and appears not in acute distress. The patient is well groomed. Head: Normocephalic, atraumatic. Neck is supple. Mallampati 1, the airway is short and ends abruptly is not narrow. The palate is of normal shape the uvula is midline the tongue is midline without fasciculation or tremor. neck circumference: 18 Nasal airflow unrestircted , TMJ click not  evident . Retrognathia is seen.  Cardiovascular:  Regular rate and rhythm , without  murmurs or carotid bruit, and without distended neck veins. Respiratory: Lungs are clear to auscultation. Skin:  Without evidence of edema, or rash Trunk: BMI is elevated . The patient's posture is erect    Neurologic exam : The patient is awake and alert, oriented to place and time.   Memory subjective  described as intact.  Attention span & concentration ability appears normal.  Speech is fluent,  without dysarthria, dysphonia or aphasia.  Mood and affect are appropriate.  Cranial nerves: Pupils are equal and briskly reactive to light. Funduscopic exam without dence of pallor or edema. Extraocular movements in vertical and horizontal planes intact and without nystagmus.  Visual fields by finger perimetry are restricted just to upper right quadrant, not the full quadrant. Pie in the sky.  Hearing to finger rub intact.   Facial sensation intact to fine touch.  Facial motor strength is symmetric and tongue and uvula move midline. Shoulder shrug was  symmetrical.   Motor exam:   Normal tone, muscle bulk and symmetric strength in all extremities. He has lost propioception in his lower left extremity- related to hip and knee surgery and joint replacement.    Fine touch, pinprick and vibration were tested in all extremities. He has profound numbness to the bottom of his feet. And he has a peroneal  nerve injury at the lateral patellar of the left leg affecting the lateral ankle and rim of the foot. Proprioception in the upper extremities was normal.  Coordination: Rapid alternating movements in the fingers/hands was normal.  Finger-to-nose maneuver  normal without evidence of ataxia, dysmetria or tremor.   Gait and station: Patient walks without assistive device and is able unassisted to climb up to the exam table. Strength within normal limits.  Stance is stable and normal.   Toe and heel stand were tested .Turns with 4  Steps. Romberg testing is negative.  Deep tendon reflexes: in the  upper and lower extremities are symmetric and intact.  The patient was advised of the nature of the diagnosed sleep disorder , the treatment options and risks for general a health and wellness arising from not treating the condition.  I spent more than 45  minutes of face to face time with the patient. Greater than 50% of time was spent in counseling and coordination of care. We have discussed the diagnosis and differential and I answered the patient's questions.    08-14-12 the patient was evaluated for a stroke at [facility].  Had an MRI of the brain that day, after review of the MRI in the emergency department he was transferred to: To the stroke service and an occipital stroke was identified. IMPRESSION: 1.5 cm acute infarction in the left occipital cortical and subcortical brain with an adjacent 3 mm infarction. Findings are consistent with embolic infarction. No hemorrhage or mass effect.  These results were called by telephone at the time of interpretation on 04/17/2013 at 11:35 AM to Dr. Arlyss Queen , who verbally acknowledged these results. I also discussed the case directly with the patient.IMPRESSION: 1.5 cm acute infarction in the left occipital cortical and subcortical brain with an adjacent 3 mm infarction. Findings are consistent with embolic infarction. No hemorrhage or  mass effect.  These results were called by telephone at the time of interpretation on 04/17/2013 at 11:35 AM to Dr. Arlyss Queen , who verbally acknowledged these results. I also discussed the case directly with the patient.    Assessment:  After physical and neurologic examination, review of laboratory studies,  Personal review of imaging studies, reports of other /same  Imaging studies ,  Results of polysomnography/ neurophysiology testing and pre-existing records as far as provided in visit., my assessment is   1) Phillip [name] has done excellently with his current CPAP treatment unfortunately he was not asked to bring the machine to this appointment. I think that we will need a baseline reevaluation to see worse current apnea may be. He has been highly compliant by report that it is likely that a new machine would not require changes and settings. However I need to establish a new baseline after almost 10 years. I will ask him to undergo a sleep study I would prefer an in lab split-night polysomnography but also allows [place] to titrate him the same night. His insurance will usually allow [place] to do a spit night polysomnography. It can be done in the early month of 2017.  2) the patients  body weight for supposedly higher when he was initially evaluated for sleep apnea and the so if he continues to reduce his body mass index he may treat his sleep apnea in the process. It will also benefit hypertension, gout and diabetes. His diabetes is well-controlled HbA1c was 7.2 last month.   3)The patient is not in need of any stimulant medication to combat excessive daytime sleepiness, based on his subjective Epworth and fatigue severity scores.   4)I would also be reluctant to prescribe a stimulant given that he has a history of hypertension and occipital stroke.  He has still migraines , takes aleve and Tylenol.  I would like to add that the occipital stroke followed the intake of a triptan by about 12  hours   Plan:  Treatment plan and additional workup : SPLIT night polysomnography wth CO2   Eesa Justiss, MD  04/23/2015   CC: Darlyne Russian, [place], [place] [address]

## 2015-04-23 NOTE — Patient Instructions (Signed)

## 2015-05-11 DIAGNOSIS — I2510 Atherosclerotic heart disease of native coronary artery without angina pectoris: Secondary | ICD-10-CM | POA: Diagnosis not present

## 2015-05-11 DIAGNOSIS — E119 Type 2 diabetes mellitus without complications: Secondary | ICD-10-CM | POA: Diagnosis not present

## 2015-05-11 DIAGNOSIS — I10 Essential (primary) hypertension: Secondary | ICD-10-CM | POA: Diagnosis not present

## 2015-05-11 DIAGNOSIS — I472 Ventricular tachycardia: Secondary | ICD-10-CM | POA: Diagnosis not present

## 2015-05-13 ENCOUNTER — Ambulatory Visit (INDEPENDENT_AMBULATORY_CARE_PROVIDER_SITE_OTHER): Payer: 59 | Admitting: Neurology

## 2015-05-13 DIAGNOSIS — G4733 Obstructive sleep apnea (adult) (pediatric): Secondary | ICD-10-CM | POA: Diagnosis not present

## 2015-05-13 DIAGNOSIS — G43109 Migraine with aura, not intractable, without status migrainosus: Secondary | ICD-10-CM

## 2015-05-13 DIAGNOSIS — Z9989 Dependence on other enabling machines and devices: Secondary | ICD-10-CM

## 2015-05-13 DIAGNOSIS — E1149 Type 2 diabetes mellitus with other diabetic neurological complication: Secondary | ICD-10-CM

## 2015-05-13 DIAGNOSIS — E1140 Type 2 diabetes mellitus with diabetic neuropathy, unspecified: Secondary | ICD-10-CM

## 2015-05-13 DIAGNOSIS — I63113 Cerebral infarction due to embolism of bilateral vertebral arteries: Secondary | ICD-10-CM

## 2015-05-14 NOTE — Sleep Study (Signed)
Please see the scanned sleep study interpretation located in the Procedure tab within the Chart Review section. 

## 2015-05-18 DIAGNOSIS — I472 Ventricular tachycardia: Secondary | ICD-10-CM | POA: Diagnosis not present

## 2015-05-18 DIAGNOSIS — Z4509 Encounter for adjustment and management of other cardiac device: Secondary | ICD-10-CM | POA: Diagnosis not present

## 2015-05-18 DIAGNOSIS — I10 Essential (primary) hypertension: Secondary | ICD-10-CM | POA: Diagnosis not present

## 2015-05-19 ENCOUNTER — Telehealth: Payer: Self-pay

## 2015-05-19 DIAGNOSIS — G4733 Obstructive sleep apnea (adult) (pediatric): Secondary | ICD-10-CM

## 2015-05-19 NOTE — Telephone Encounter (Signed)
Spoke to pt regarding his sleep study results. I advised him that severe osa was seen in his study and Dr. Brett Fairy recommends starting a cpap at 11 cm H2O with a c flex or air sense function of 2 cm H2O. Pt states that he is agreeable to getting a new cpap, but he wants to research which one he gets. [place] and there often is not electricity for him to use his cpap. He will do some research and then call me with the kind of cpap he wants. He is also thinking about switching from [facility], perhaps to [facility]. Will wait for pt's call to place order.

## 2015-06-12 NOTE — Telephone Encounter (Signed)
Pt called said he will use [facility]. He said he talked to [place] yesterday(06/11/15) and is will use [facility]

## 2015-06-15 NOTE — Addendum Note (Signed)
Addended by: Lester Ali Chukson A on: 06/15/2015 09:58 AM   Modules accepted: Orders

## 2015-06-15 NOTE — Telephone Encounter (Signed)
I spoke to pt and verified with him that he wants to use [facility] and a resmed cpap (even though Dr. Brett Fairy recommended a dream station.). Pt agreed. He wants to use the newest [facility]. Will send order to [place]. Pt verbalized understanding.

## 2015-06-18 DIAGNOSIS — Z4509 Encounter for adjustment and management of other cardiac device: Secondary | ICD-10-CM | POA: Diagnosis not present

## 2015-06-30 ENCOUNTER — Other Ambulatory Visit: Payer: Self-pay | Admitting: Physician Assistant

## 2015-06-30 MED ORDER — EXENATIDE ER 2 MG ~~LOC~~ PEN: 2.0000 mg | PEN_INJECTOR | SUBCUTANEOUS | Status: DC

## 2015-06-30 MED FILL — BYDUREON 2 MG PEN INJECT: 2 | 84 days supply | Qty: 12 | Fill #0

## 2015-06-30 NOTE — Progress Notes (Signed)
Pt called he needs a refill as he has been using samples.  3 month supply sent to pharmacy

## 2015-07-02 ENCOUNTER — Other Ambulatory Visit: Payer: Self-pay | Admitting: Physician Assistant

## 2015-07-02 MED ORDER — CANAGLIFLOZIN 300 MG PO TABS: 300.0000 mg | ORAL_TABLET | Freq: Every day | ORAL | Status: DC

## 2015-07-02 MED FILL — INVOKANA 300 MG TABLET: 300 | 90 days supply | Qty: 90 | Fill #0

## 2015-07-02 NOTE — Progress Notes (Signed)
Patient calling for a refill of his Invokana.  This works well for him and he denies any side effects.  Will make Dr. Everlene Farrier aware.  Refilled for one year.

## 2015-07-03 DIAGNOSIS — G4733 Obstructive sleep apnea (adult) (pediatric): Secondary | ICD-10-CM | POA: Diagnosis not present

## 2015-07-16 ENCOUNTER — Other Ambulatory Visit: Payer: Self-pay | Admitting: Family Medicine

## 2015-07-16 ENCOUNTER — Telehealth: Payer: Self-pay | Admitting: *Deleted

## 2015-07-16 DIAGNOSIS — M1000 Idiopathic gout, unspecified site: Secondary | ICD-10-CM

## 2015-07-16 MED ORDER — METFORMIN HCL ER (MOD) 1000 MG PO TB24: 1000.0000 mg | ORAL_TABLET | Freq: Two times a day (BID) | ORAL | Status: DC

## 2015-07-16 MED ORDER — GLUCOSE BLOOD VI STRP: ORAL_STRIP | Status: DC

## 2015-07-16 MED ORDER — CLOPIDOGREL BISULFATE 75 MG PO TABS: 75.0000 mg | ORAL_TABLET | Freq: Every day | ORAL | Status: DC

## 2015-07-16 MED ORDER — LOSARTAN POTASSIUM 100 MG PO TABS: 100.0000 mg | ORAL_TABLET | Freq: Every day | ORAL | Status: DC

## 2015-07-16 MED ORDER — ALLOPURINOL 300 MG PO TABS: 300.0000 mg | ORAL_TABLET | Freq: Every day | ORAL | Status: DC

## 2015-07-16 MED FILL — LOSARTAN POTASSIUM 100 MG T: 100 | 90 days supply | Qty: 90 | Fill #0

## 2015-07-16 MED FILL — ROSUVASTATIN CALCIUM 5 MG T: 5 | 90 days supply | Qty: 90 | Fill #1

## 2015-07-16 MED FILL — CLOPIDOGREL 75 MG TABLET: 75 | 90 days supply | Qty: 90 | Fill #0

## 2015-07-16 MED FILL — ALLOPURINOL 300 MG TABLET: 300 | 90 days supply | Qty: 90 | Fill #0

## 2015-07-16 MED FILL — BYSTOLIC 10 MG TABLET: 10 | 90 days supply | Qty: 90 | Fill #2

## 2015-07-16 MED FILL — metFORMIN HCL 1000 MG TABS: 1000 | 90 days supply | Qty: 180 | Fill #0

## 2015-07-16 NOTE — Progress Notes (Signed)
Six month refill of patient's medication per his request. He will follow up with Dr. Everlene Farrier.

## 2015-07-16 NOTE — Telephone Encounter (Signed)
Call received from [facility] saying that they have a prescription for [name] Metformin. He normally takes immediate release but the prescription was written for extended release. [name] stated that he has taken immediate release for many years and it is ok to fill the immediate release as normal.

## 2015-07-18 DIAGNOSIS — Z4509 Encounter for adjustment and management of other cardiac device: Secondary | ICD-10-CM | POA: Diagnosis not present

## 2015-07-18 DIAGNOSIS — I472 Ventricular tachycardia: Secondary | ICD-10-CM | POA: Diagnosis not present

## 2015-07-18 DIAGNOSIS — I10 Essential (primary) hypertension: Secondary | ICD-10-CM | POA: Diagnosis not present

## 2015-07-21 ENCOUNTER — Other Ambulatory Visit: Payer: Self-pay

## 2015-07-21 ENCOUNTER — Ambulatory Visit (INDEPENDENT_AMBULATORY_CARE_PROVIDER_SITE_OTHER): Payer: 59 | Admitting: Emergency Medicine

## 2015-07-21 ENCOUNTER — Encounter: Payer: Self-pay | Admitting: Emergency Medicine

## 2015-07-21 VITALS — BP 126/70 | HR 62 | Temp 98.0°F | Resp 20 | Ht 71.65 in | Wt 228.8 lb

## 2015-07-21 DIAGNOSIS — G8929 Other chronic pain: Secondary | ICD-10-CM

## 2015-07-21 DIAGNOSIS — Z125 Encounter for screening for malignant neoplasm of prostate: Secondary | ICD-10-CM

## 2015-07-21 DIAGNOSIS — E119 Type 2 diabetes mellitus without complications: Secondary | ICD-10-CM

## 2015-07-21 DIAGNOSIS — M545 Low back pain: Secondary | ICD-10-CM

## 2015-07-21 DIAGNOSIS — G2581 Restless legs syndrome: Secondary | ICD-10-CM

## 2015-07-21 DIAGNOSIS — N400 Enlarged prostate without lower urinary tract symptoms: Secondary | ICD-10-CM | POA: Diagnosis not present

## 2015-07-21 DIAGNOSIS — M1000 Idiopathic gout, unspecified site: Secondary | ICD-10-CM | POA: Diagnosis not present

## 2015-07-21 DIAGNOSIS — D649 Anemia, unspecified: Secondary | ICD-10-CM | POA: Diagnosis not present

## 2015-07-21 DIAGNOSIS — M109 Gout, unspecified: Secondary | ICD-10-CM | POA: Diagnosis not present

## 2015-07-21 LAB — CBC WITH DIFFERENTIAL/PLATELET
BASOS ABS: 0 10*3/uL (ref 0.0–0.1)
Basophils Relative: 0 % (ref 0–1)
EOS ABS: 0.1 10*3/uL (ref 0.0–0.7)
Eosinophils Relative: 2 % (ref 0–5)
HCT: 42.2 % (ref 39.0–52.0)
HEMOGLOBIN: 14.5 g/dL (ref 13.0–17.0)
LYMPHS ABS: 1.6 10*3/uL (ref 0.7–4.0)
LYMPHS PCT: 27 % (ref 12–46)
MCH: 30.5 pg (ref 26.0–34.0)
MCHC: 34.4 g/dL (ref 30.0–36.0)
MCV: 88.7 fL (ref 78.0–100.0)
MONOS PCT: 10 % (ref 3–12)
MPV: 9.2 fL (ref 8.6–12.4)
Monocytes Absolute: 0.6 10*3/uL (ref 0.1–1.0)
NEUTROS ABS: 3.5 10*3/uL (ref 1.7–7.7)
NEUTROS PCT: 61 % (ref 43–77)
PLATELETS: 304 10*3/uL (ref 150–400)
RBC: 4.76 MIL/uL (ref 4.22–5.81)
RDW: 12.7 % (ref 11.5–15.5)
WBC: 5.8 10*3/uL (ref 4.0–10.5)

## 2015-07-21 LAB — POCT GLYCOSYLATED HEMOGLOBIN (HGB A1C): Hemoglobin A1C: 7.4

## 2015-07-21 LAB — FERRITIN: Ferritin: 60 ng/mL (ref 20–380)

## 2015-07-21 LAB — GLUCOSE, POCT (MANUAL RESULT ENTRY): POC Glucose: 101 mg/dl — AB (ref 70–99)

## 2015-07-21 MED ORDER — GLUCOSE BLOOD VI STRP: ORAL_STRIP | Status: DC

## 2015-07-21 MED FILL — TRUE METRIX GLUCOSE TEST ST: 90 days supply | Qty: 400 | Fill #0

## 2015-07-21 NOTE — Progress Notes (Signed)
Chief Complaint:  Chief Complaint  Patient presents with  . Follow-up    diabetes chek out    HPI: Phillip [name] is a 65 y.o. male who reports to [facility] today complaining of for follow-up of his diabetes. Overall he has been doing well and feels his sugar has been under relatively good control. He exercises regularly. He has had no changes in his diet. He has regular follow-ups with his cardiologist once year. He does go to the eye doctor once a year. He was notified from the gastroenterologist of his need to have a colonoscopy. He has been borderline anemic for years. He recently has had increasing problems with restless leg syndrome and cramping in his legs. He is not sure if it is related to his long shifts or somehow related to his obstructive sleep apnea.  Past Medical History  Diagnosis Date  . Anemia   . Arthritis   . Diabetes mellitus   . Hyperlipidemia   . Hypertension   . Sleep apnea     cpap  . Gout   . Osteoarthritis of left hip 11/20/2012  . Localized osteoarthritis of left knee 11/20/2012  . Neuromuscular disorder ([place])     " mild periferal neauropathy  . PVC (premature ventricular contraction)   . Migraine headache with aura    Past Surgical History  Procedure Laterality Date  . Cholecystectomy  1997  . Knee arthoscopy  2006    Left Knee  . Tonsillectomy and adenoidectomy      Childhood  . Total hip arthroplasty Left 11/20/2012    Dr Mardelle Matte  . Replacement unicondylar joint knee Left 11/20/2012    Dr Mardelle Matte  . Partial knee arthroplasty Left 11/20/2012    Procedure: UNICOMPARTMENTAL KNEE;  Surgeon: Johnny Bridge, MD;  Location: [place];  Service: Orthopedics;  Laterality: Left;  . Total hip arthroplasty Left 11/20/2012    Procedure: TOTAL HIP ARTHROPLASTY;  Surgeon: Johnny Bridge, MD;  Location: [place];  Service: Orthopedics;  Laterality: Left;  . Tee without cardioversion N/A 04/19/2013    Procedure: TRANSESOPHAGEAL ECHOCARDIOGRAM (TEE);  Surgeon:  Larey Dresser, MD;  Location: [place];  Service: Cardiovascular;  Laterality: N/A;  . Cva      CVA, occiptal  . Cva      CVA, occipital  . Left total hip    . Left unicarpmental knee    . Loop recorder implant N/A 04/19/2013    Procedure: LOOP RECORDER IMPLANT;  Surgeon: Evans Lance, MD;  Location: [facility] CATH LAB;  Service: Cardiovascular;  Laterality: N/A;   Social History   Social History  . Marital Status: Married    Spouse Name: Learta Codding  . Number of Children: 4  . Years of Education: 23   Occupational History  . [employer]   Social History Main Topics  . Smoking status: Never Smoker   . Smokeless tobacco: Never Used  . Alcohol Use: No  . Drug Use: No  . Sexual Activity: Not Asked   Other Topics Concern  . None   Social History Narrative   Patient lives at home with family.   Caffeine Use: occasionally tea   Family History  Problem Relation Age of Onset  . Cancer Mother     Breast  . Stroke Mother   . Stroke Maternal Grandmother   . Stroke Maternal Aunt   . Stroke Maternal Uncle    Allergies  Allergen Reactions  .  Sulfa Antibiotics Other (See Comments)    Unknown reaction   Prior to Admission medications   Medication Sig Start Date End Date Taking? Authorizing Provider  acetaminophen (TYLENOL) 650 MG CR tablet Take 1,300 mg by mouth every 8 (eight) hours as needed for pain.   Yes Historical Provider, MD  allopurinol (ZYLOPRIM) 300 MG tablet Take 1 tablet (300 mg total) by mouth daily. 07/16/15  Yes Elby Beck, FNP  blood glucose meter kit and supplies KIT Use up to four times daily as directed. Dx E13.40. 02/18/15  Yes Darlyne Russian, MD  canagliflozin (INVOKANA) 300 MG TABS tablet Take 1 tablet (300 mg total) by mouth daily. 07/02/15  Yes Tereasa Coop, PA-C  clopidogrel (PLAVIX) 75 MG tablet Take 1 tablet (75 mg total) by mouth daily with breakfast. 07/16/15  Yes Elby Beck, FNP  Exenatide ER 2 MG PEN Inject 2 mg into the skin once a  week. 06/30/15  Yes Mancel Bale, PA-C  glucose blood (TRUE METRIX BLOOD GLUCOSE TEST) test strip Use to check blood sugar up to 4 times daily. Dx : E13.40 07/21/15  Yes Darlyne Russian, MD  losartan (COZAAR) 100 MG tablet Take 1 tablet (100 mg total) by mouth daily. 07/16/15  Yes Elby Beck, FNP  metFORMIN (GLUMETZA) 1000 MG (MOD) 24 hr tablet Take 1 tablet (1,000 mg total) by mouth 2 (two) times daily with a meal. 07/16/15  Yes Elby Beck, FNP  nebivolol (BYSTOLIC) 10 MG tablet Take 10 mg by mouth daily.   Yes Historical Provider, MD  rosuvastatin (CRESTOR) 5 MG tablet Take 1 tab daily 04/19/15  Yes Darlyne Russian, MD  SM LANCETS 33G MISC USE TO TEST BLOOD SUGAR TWICE DAILY 01/24/13  Yes Darlyne Russian, MD  zolpidem (AMBIEN) 5 MG tablet TAKE 1 TABLET BY MOUTH ONCE DAILY AT BEDTIME AS NEEDED FOR SLEEP 01/23/15  Yes Darlyne Russian, MD  metaxalone (SKELAXIN) 800 MG tablet Take 1 tablet (800 mg total) by mouth every 6 (six) hours as needed. Patient not taking: Reported on 04/23/2015 11/23/12   Kirstin Shepperson, PA-C     ROS: The patient denies fevers, chills, night sweats, unintentional weight loss, chest pain, palpitations, wheezing, dyspnea on exertion, nausea, vomiting, abdominal pain, dysuria, hematuria, melena, numbness, weakness, or tingling.\  All other systems have been reviewed and were otherwise negative with the exception of those mentioned in the HPI and as above.    PHYSICAL EXAM: Filed Vitals:   07/21/15 1423  BP: 154/83  Pulse: 62  Temp: 98 F (36.7 C)  Resp: 20   Body mass index is 31.33 kg/(m^2).   General: Alert, no acute distress HEENT:  Normocephalic, atraumatic, oropharynx patent. Eye: Juliette Mangle [facility] Cardiovascular:  Regular rate and rhythm, no rubs murmurs or gallops.  No Carotid bruits, radial pulse intact. No pedal edema.  Respiratory: Clear to auscultation bilaterally.  No wheezes, rales, or rhonchi.  No cyanosis, no use of accessory musculature Abdominal:  No organomegaly, abdomen is soft and non-tender, positive bowel sounds.  No masses. Musculoskeletal: Gait intact. No edema, tenderness Skin: No rashes. There were multiple actinic keratosis and seborrheic keratosis treated with cryotherapy for 6-8 seconds. There are 2 lesions on his right arm 1 lesion on the left arm 1 lesion right lower abdomen and the last lesion was on the nasal bridge on the right. Neurologic: Facial musculature symmetric. Psychiatric: Patient acts appropriately throughout our interaction. Lymphatic: No cervical or submandibular lymphadenopathy  LABS: Results for orders placed or performed in visit on 07/21/15  POCT glucose (manual entry)  Result Value Ref Range   POC Glucose 101 (A) 70 - 99 mg/dl   Results for orders placed or performed in visit on 07/21/15  POCT glycosylated hemoglobin (Hb A1C)  Result Value Ref Range   Hemoglobin A1C 7.4   POCT glucose (manual entry)  Result Value Ref Range   POC Glucose 101 (A) 70 - 99 mg/dl     EKG/XRAY:   Primary read interpreted by Dr. Everlene Farrier at [facility].   ASSESSMENT/PLAN: Routine labs were done. Will check iron studies and ferritin because of his recent worsening restless leg syndrome. Also did a CK since patient is on Crestor. Blood pressure is at goal. No changes at the present time in his diabetes regimen. He will schedule his own colonoscopy.I personally performed the services described in this documentation, which was scribed in my presence. The recorded information has been reviewed and is accurate. Hemoglobin A1c stable at 7.4.   Gross sideeffects, risk and benefits, and alternatives of medications d/w patient. Patient is aware that all medications have potential sideeffects and we are unable to predict every sideeffect or drug-drug interaction that may occur.  Arlyss Queen MD 07/21/2015 2:35 PM

## 2015-07-21 NOTE — Patient Instructions (Signed)
     IF you received an x-ray today, you will receive an invoice from [facility]. Please contact [facility] at [phone] with questions or concerns regarding your invoice.   IF you received labwork today, you will receive an invoice from [facility]. Please contact [facility] at [phone] with questions or concerns regarding your invoice.   Our billing staff will not be able to assist you with questions regarding bills from these companies.  You will be contacted with the lab results as soon as they are available. The fastest way to get your results is to activate your My Chart account. Instructions are located on the last page of this paperwork. If you have not heard from us regarding the results in 2 weeks, please contact this office.      

## 2015-07-22 LAB — LIPID PANEL
CHOL/HDL RATIO: 2.8 ratio (ref <=5.0)
CHOLESTEROL: 124 mg/dL — AB (ref 125–200)
HDL: 45 mg/dL (ref >=40)
LDL Cholesterol: 42 mg/dL (ref <130)
TRIGLYCERIDES: 186 mg/dL — AB (ref <150)
VLDL: 37 mg/dL — AB (ref <30)

## 2015-07-22 LAB — COMPLETE METABOLIC PANEL WITH GFR
ALBUMIN: 4.6 g/dL (ref 3.6–5.1)
ALK PHOS: 57 U/L (ref 40–115)
ALT: 14 U/L (ref 9–46)
AST: 13 U/L (ref 10–35)
BILIRUBIN TOTAL: 0.6 mg/dL (ref 0.2–1.2)
BUN: 20 mg/dL (ref 7–25)
CO2: 23 mmol/L (ref 20–31)
CREATININE: 0.96 mg/dL (ref 0.70–1.25)
Calcium: 10.1 mg/dL (ref 8.6–10.3)
Chloride: 103 mmol/L (ref 98–110)
GFR, Est Non African American: 82 mL/min (ref >=60)
GLUCOSE: 91 mg/dL (ref 65–99)
Potassium: 4.6 mmol/L (ref 3.5–5.3)
Sodium: 138 mmol/L (ref 135–146)
TOTAL PROTEIN: 7 g/dL (ref 6.1–8.1)

## 2015-07-22 LAB — IRON AND TIBC
%SAT: 33 % (ref 15–60)
Iron: 130 ug/dL (ref 50–180)
TIBC: 399 ug/dL (ref 250–425)
UIBC: 269 ug/dL (ref 125–400)

## 2015-07-22 LAB — CK: Total CK: 276 U/L — ABNORMAL HIGH (ref 7–232)

## 2015-07-22 LAB — URIC ACID: URIC ACID, SERUM: 3.8 mg/dL — AB (ref 4.0–7.8)

## 2015-07-22 LAB — PSA, MEDICARE: PSA: 1.65 ng/mL (ref <=4.00)

## 2015-07-27 DIAGNOSIS — M25552 Pain in left hip: Secondary | ICD-10-CM | POA: Diagnosis not present

## 2015-07-27 DIAGNOSIS — M1811 Unilateral primary osteoarthritis of first carpometacarpal joint, right hand: Secondary | ICD-10-CM | POA: Diagnosis not present

## 2015-07-27 DIAGNOSIS — Z96652 Presence of left artificial knee joint: Secondary | ICD-10-CM | POA: Diagnosis not present

## 2015-07-29 DIAGNOSIS — Z1211 Encounter for screening for malignant neoplasm of colon: Secondary | ICD-10-CM | POA: Diagnosis not present

## 2015-08-03 DIAGNOSIS — G4733 Obstructive sleep apnea (adult) (pediatric): Secondary | ICD-10-CM | POA: Diagnosis not present

## 2015-08-12 ENCOUNTER — Ambulatory Visit (INDEPENDENT_AMBULATORY_CARE_PROVIDER_SITE_OTHER): Payer: 59 | Admitting: Neurology

## 2015-08-12 ENCOUNTER — Encounter: Payer: Self-pay | Admitting: Neurology

## 2015-08-12 VITALS — BP 152/90 | HR 86 | Resp 20 | Ht 72.0 in | Wt 233.0 lb

## 2015-08-12 DIAGNOSIS — G4733 Obstructive sleep apnea (adult) (pediatric): Secondary | ICD-10-CM | POA: Diagnosis not present

## 2015-08-12 DIAGNOSIS — Z9989 Dependence on other enabling machines and devices: Principal | ICD-10-CM

## 2015-08-12 NOTE — Progress Notes (Signed)
SLEEP MEDICINE CLINIC   Provider:  Larey Seat, M [name]  Referring Provider: Darlyne Russian, [name] Primary Care Physician:  Phillip [name]  Chief Complaint  Patient presents with  . Follow-up    feels air hungry, cpap going well otherwise, rm 10, alone    HPI:  Phillip [name] is a 65 y.o. male , seen here as a referral from Phillip [name] for OSA , transfer of sleep care. The patient suffered an occipital stroke in 2014. He has a long history of migraines.  He had a last sleep study in [place], by Dr. Scarlette [name]. 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 This remains worrisome for him as he should not take Triptan- medication. After his presumed visual aura persisted for about 14 hours he was seen by one of his colleagues in the same office, Dr. Arlyss [name], and initiated a stroke workup. 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 He is still taking Onglyza,. He is seen today because he had not had a repeat sleep study for the last 12 years. He has been on the same setting and on the same type CPAP machine. He is interested in reevaluating his sleep apnea and treatment options. He recalls that about 8 years ago he was on a medical mission and used his original CPAP machine on a car battery and it broke. He replaced the  the same type however at the same settings.   Sleep habits are as follows: He usually is in bed by around midnight falls asleep promptly and rises at 7 AM. He does work irregular hours at urgent care. Some nights he will not be home earlier than 11 other nights he will have to wake up earlier to do an early shift. He sleeps usually on one pillow while in supine position on 2 pillows if on the side. Bedroom is cool, quiet and dark.  The patient will have at least one nocturia break at night around 5 AM and he usually can go back to sleep. He  states that he rarely is able to recall a dream and that he seems to dream infrequently. No vivid dreams and no dream enactment. No history of sleep paralysis, dream intrusion, hypnagogic hallucinations. He estimates that he sleeps the first 70% of the night in supine position but depending on back pain he may have to change to the side. His wife has not reported breakthrough snoring but has noted occasional air leaking by mouth. He has noted that he has a very dry mouth when he wakes up if he had or leakage. He feels refreshed and restored when he wakes up. He has noted a great improvement in comparison to prior to CPAP use and does no longer sleep well if the CPAP is not available. His Epworth sleepiness score is endorsed at only 5 points fatigue severity at only 18 points and the geriatric depression score endorsed at 0.   Interval history from 08/12/2015 [name] is here after his recent sleep study performed on 05/13/2015, during which he was diagnosed again with sleep apnea at an AHI of 49.8 no REM sleep was noted during the diagnostic part, supine AHI was 54.8 and nonsupine 27.3 average end tidal CO2 was 30.64 torr, no prolonged oxygen desaturations were noted. Occasional premature ventricular beats were seen, the patient was titrated to CPAP  and was doing well on 11 cm water pressure. A 2 cm flex function was requested.  Today we also having  the opportunity to see the first download residual AHI is 2.6, at 11 cm water pressure with an average user time of 6 hours and 51 minutes and 100% compliance for days of use and over 4 hours of daily use. [name] would like an auto set function that can be used as he loses weight.   Sleep medical history and family sleep history: Brother on CPAP, his "sister should be".  Social history: married, no tobacco use ever, no ETOH , caffeine user - plain iced tea , 3-4 glasses a day.   Review of Systems: Out of a complete 14 system review, the patient  complains of only the following symptoms, and all other reviewed systems are negative.  Epworth score 3  on CPAP  , Fatigue severity score 15   , depression score; none.   Social History   Social History  . Marital Status: Married    Spouse Name: Phillip [name]  . Number of Children: 4  . Years of Education: 23   Occupational History  . [name]   Social History Main Topics  . Smoking status: Never Smoker   . Smokeless tobacco: Never Used  . Alcohol Use: No  . Drug Use: No  . Sexual Activity: Not on file   Other Topics Concern  . Not on file   Social History Narrative   Patient lives at home with family.   Caffeine Use: occasionally tea    Family History  Problem Relation Age of Onset  . Cancer Mother     Breast  . Stroke Mother   . Stroke Maternal Grandmother   . Stroke Maternal Aunt   . Stroke Maternal Uncle     Past Medical History  Diagnosis Date  . Anemia   . Arthritis   . Diabetes mellitus   . Hyperlipidemia   . Hypertension   . Sleep apnea     cpap  . Gout   . Osteoarthritis of left hip 11/20/2012  . Localized osteoarthritis of left knee 11/20/2012  . Neuromuscular disorder ([place])     " mild periferal neauropathy  . PVC (premature ventricular contraction)   . Migraine headache with aura     Past Surgical History  Procedure Laterality Date  . Cholecystectomy  1997  . Knee arthoscopy  2006    Left Knee  . Tonsillectomy and adenoidectomy      Childhood  . Total hip arthroplasty Left 11/20/2012    Dr Phillip [name]  . Replacement unicondylar joint knee Left 11/20/2012    Dr Phillip [name]  . Partial knee arthroplasty Left 11/20/2012    Procedure: UNICOMPARTMENTAL KNEE;  Surgeon: Phillip Bridge, [name];  Location: [place];  Service: Orthopedics;  Laterality: Left;  . Total hip arthroplasty Left 11/20/2012    Procedure: TOTAL HIP ARTHROPLASTY;  Surgeon: Phillip Bridge, [name];  Location: [place];  Service: Orthopedics;  Laterality: Left;  . Tee without cardioversion N/A  04/19/2013    Procedure: TRANSESOPHAGEAL ECHOCARDIOGRAM (TEE);  Surgeon: Phillip Dresser, [name];  Location: [place];  Service: Cardiovascular;  Laterality: N/A;  . Cva      CVA, occiptal  . Cva      CVA, occipital  . Left total hip    . Left unicarpmental knee    . Loop recorder implant N/A 04/19/2013    Procedure: LOOP RECORDER IMPLANT;  Surgeon: Carleene Overlie  [name], [name];  Location: [facility] CATH LAB;  Service: Cardiovascular;  Laterality: N/A;    Current Outpatient Prescriptions  Medication Sig Dispense Refill  . acetaminophen (TYLENOL) 650 MG CR tablet Take 1,300 mg by mouth every 8 (eight) hours as needed for pain.    [name] allopurinol (ZYLOPRIM) 300 MG tablet Take 1 tablet (300 mg total) by mouth daily. 90 tablet 1  . blood glucose meter kit and supplies KIT Use up to four times daily as directed. Dx E13.40. 1 each 0  . canagliflozin (INVOKANA) 300 MG TABS tablet Take 1 tablet (300 mg total) by mouth daily. 90 tablet 3  . clopidogrel (PLAVIX) 75 MG tablet Take 1 tablet (75 mg total) by mouth daily with breakfast. 90 tablet 1  . Exenatide ER 2 MG PEN Inject 2 mg into the skin once a week. 12 each 0  . glucose blood (TRUE METRIX BLOOD GLUCOSE TEST) test strip Use to check blood sugar up to 4 times daily. Dx : E13.40 400 each 3  . losartan (COZAAR) 100 MG tablet Take 1 tablet (100 mg total) by mouth daily. 90 tablet 1  . metaxalone (SKELAXIN) 800 MG tablet Take 1 tablet (800 mg total) by mouth every 6 (six) hours as needed. 60 tablet 1  . metFORMIN (GLUMETZA) 1000 MG (MOD) 24 hr tablet Take 1 tablet (1,000 mg total) by mouth 2 (two) times daily with a meal. 180 tablet 1  . nebivolol (BYSTOLIC) 10 MG tablet Take 10 mg by mouth daily.    . rosuvastatin (CRESTOR) 5 MG tablet Take 1 tab daily (Patient taking differently: 2 (two) times a week. Take 1 tab daily) 90 tablet 3  . SM LANCETS 33G MISC USE TO TEST BLOOD SUGAR TWICE DAILY 400 each PRN  . zolpidem (AMBIEN) 5 MG tablet TAKE 1 TABLET BY MOUTH ONCE  DAILY AT BEDTIME AS NEEDED FOR SLEEP 90 tablet 1   No current facility-administered medications for this visit.    Allergies as of 08/12/2015 - Review Complete 08/12/2015  Allergen Reaction Noted  . Sulfa antibiotics Other (See Comments) 06/01/2011    Vitals: BP 152/90 mmHg  Pulse 86  Resp 20  Ht 6' (1.829 m)  Wt 233 lb (105.688 kg)  BMI 31.59 kg/m2 Last Weight:  Wt Readings from Last 1 Encounters:  08/12/15 233 lb (105.688 kg)   YNW:GNFA mass index is 31.59 kg/(m^2).     Last Height:   Ht Readings from Last 1 Encounters:  08/12/15 6' (1.829 m)    Physical exam:  General: The patient is awake, alert and appears not in acute distress. The patient is well groomed. Head: Normocephalic, atraumatic. Neck is supple. Mallampati 1, the airway is short and ends abruptly is not narrow. The palate is of normal shape the uvula is midline the tongue is midline without fasciculation or tremor. neck circumference: 18 Nasal airflow unrestircted , TMJ click not  evident . Retrognathia is seen.  Cardiovascular:  Regular rate and rhythm , without  murmurs or carotid bruit, and without distended neck veins. Respiratory: Lungs are clear to auscultation. Skin:  Without evidence of edema, or rash Trunk: BMI is elevated . The patient's posture is erect    Neurologic exam : The patient is awake and alert, oriented to place and time.   Memory subjective  described as intact.  Attention span & concentration ability appears normal.  Speech is fluent,  without dysarthria, dysphonia or aphasia.  Mood and affect are appropriate.  Cranial nerves:  Pupils are equal and briskly reactive to light. Funduscopic exam without dence of pallor or edema. Extraocular movements in vertical and horizontal planes intact and without nystagmus.  Visual fields by finger perimetry are restricted just to upper right quadrant, not the full quadrant. Pie in the sky.  Hearing to finger rub intact. Facial sensation intact to  fine touch.Facial motor strength is symmetric and tongue and uvula move midline. Shoulder shrug was symmetrical.  Deep tendon reflexes: in the  upper and lower extremities are symmetric and intact.  The patient was advised of the nature of the diagnosed sleep disorder , the treatment options and risks for general a health and wellness arising from not treating the condition.  I spent more than 25  minutes of face to face time with the patient. Greater than 50% of time was spent in counseling and coordination of care. We have discussed the diagnosis and differential and I answered the patient's questions.    On 08-14-12 the patient was evaluated for a stroke at [facility]. Has followed  With Dr Leonie Man, [name] Had an MRI of the brain that day, after review of the MRI in the emergency department he was transferred to: To the stroke service and an occipital stroke was identified.   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 100% compliance . 2) the patients body weight for supposedly higher when he was initially evaluated for sleep apnea and the so if he continues to reduce his body mass index he may treat his sleep apnea in the process. It will also benefit hypertension, gout and diabetes. His diabetes is well-controlled HbA1c was 7.2 last month.   Plan:  Rv in 21 month    Sharnell Knight, [name]  08/12/2015   CC: Phillip Russian, [name], [place] [id number]   Cc Dr Antony Contras, primary neurologist.

## 2015-08-15 ENCOUNTER — Telehealth: Payer: Self-pay | Admitting: Emergency Medicine

## 2015-08-15 DIAGNOSIS — M25562 Pain in left knee: Secondary | ICD-10-CM | POA: Diagnosis not present

## 2015-08-15 NOTE — Telephone Encounter (Signed)
I spoke with the patient. He saw his orthopedist who recommended bracing to the left knee. We'll go ahead and order a knee brace for comfort and stability.

## 2015-08-18 DIAGNOSIS — I472 Ventricular tachycardia: Secondary | ICD-10-CM | POA: Diagnosis not present

## 2015-08-18 DIAGNOSIS — Z4509 Encounter for adjustment and management of other cardiac device: Secondary | ICD-10-CM | POA: Diagnosis not present

## 2015-08-18 DIAGNOSIS — I10 Essential (primary) hypertension: Secondary | ICD-10-CM | POA: Diagnosis not present

## 2015-08-19 ENCOUNTER — Telehealth: Payer: Self-pay

## 2015-08-19 NOTE — Telephone Encounter (Signed)
Letter in tab to sign Dr Everlene Farrier for [name] colonoscopy. Please print and have someone fax to [employer] [phone]

## 2015-08-28 ENCOUNTER — Other Ambulatory Visit: Payer: Self-pay | Admitting: Emergency Medicine

## 2015-08-30 ENCOUNTER — Other Ambulatory Visit: Payer: Self-pay | Admitting: Radiology

## 2015-08-30 NOTE — Telephone Encounter (Signed)
Called in Ambien Dr Everlene Farrier printed at 104 but printer not working

## 2015-08-31 MED FILL — ZOLPIDEM TARTRATE 5 MG TAB: 5 | 90 days supply | Qty: 90 | Fill #0

## 2015-09-02 DIAGNOSIS — G4733 Obstructive sleep apnea (adult) (pediatric): Secondary | ICD-10-CM | POA: Diagnosis not present

## 2015-09-12 ENCOUNTER — Other Ambulatory Visit: Payer: Self-pay | Admitting: Emergency Medicine

## 2015-09-12 ENCOUNTER — Telehealth: Payer: Self-pay | Admitting: Emergency Medicine

## 2015-09-12 MED ORDER — DOXYCYCLINE HYCLATE 100 MG PO TABS: ORAL_TABLET | ORAL | Status: DC

## 2015-09-12 NOTE — Telephone Encounter (Signed)
Prescription sent to pharmacy for malaria prophylaxis.

## 2015-09-14 ENCOUNTER — Other Ambulatory Visit: Payer: Self-pay | Admitting: *Deleted

## 2015-09-14 MED ORDER — EXENATIDE ER 2 MG ~~LOC~~ PEN: 2.0000 mg | PEN_INJECTOR | SUBCUTANEOUS | Status: DC

## 2015-09-14 MED FILL — DOXYCYCLINE HYCLATE 100 MG: 100 | 90 days supply | Qty: 90 | Fill #0

## 2015-09-15 MED FILL — BYDUREON 2 MG PEN INJECT: 2 | 84 days supply | Qty: 12 | Fill #0

## 2015-09-16 MED FILL — INVOKANA 300 MG TABLET: 300 | 90 days supply | Qty: 90 | Fill #1

## 2015-09-17 DIAGNOSIS — I10 Essential (primary) hypertension: Secondary | ICD-10-CM | POA: Diagnosis not present

## 2015-09-17 DIAGNOSIS — Z4509 Encounter for adjustment and management of other cardiac device: Secondary | ICD-10-CM | POA: Diagnosis not present

## 2015-09-17 DIAGNOSIS — I472 Ventricular tachycardia: Secondary | ICD-10-CM | POA: Diagnosis not present

## 2015-10-03 DIAGNOSIS — G4733 Obstructive sleep apnea (adult) (pediatric): Secondary | ICD-10-CM | POA: Diagnosis not present

## 2015-10-20 DIAGNOSIS — D123 Benign neoplasm of transverse colon: Secondary | ICD-10-CM | POA: Diagnosis not present

## 2015-10-20 DIAGNOSIS — K6389 Other specified diseases of intestine: Secondary | ICD-10-CM | POA: Diagnosis not present

## 2015-10-20 DIAGNOSIS — K635 Polyp of colon: Secondary | ICD-10-CM | POA: Diagnosis not present

## 2015-10-20 DIAGNOSIS — Z1211 Encounter for screening for malignant neoplasm of colon: Secondary | ICD-10-CM | POA: Diagnosis not present

## 2015-10-20 DIAGNOSIS — K5730 Diverticulosis of large intestine without perforation or abscess without bleeding: Secondary | ICD-10-CM | POA: Diagnosis not present

## 2015-10-20 DIAGNOSIS — Z4509 Encounter for adjustment and management of other cardiac device: Secondary | ICD-10-CM | POA: Diagnosis not present

## 2015-10-20 LAB — HM COLONOSCOPY

## 2015-10-26 ENCOUNTER — Other Ambulatory Visit: Payer: Self-pay | Admitting: Emergency Medicine

## 2015-10-26 DIAGNOSIS — E0849 Diabetes mellitus due to underlying condition with other diabetic neurological complication: Secondary | ICD-10-CM

## 2015-10-27 MED FILL — ALLOPURINOL 300 MG TABLET: 300 | 90 days supply | Qty: 90 | Fill #1

## 2015-10-27 MED FILL — ROSUVASTATIN CALCIUM 5 MG T: 5 | 90 days supply | Qty: 90 | Fill #2

## 2015-10-27 MED FILL — LOSARTAN POTASSIUM 100 MG T: 100 | 90 days supply | Qty: 90 | Fill #1

## 2015-10-27 MED FILL — metFORMIN HCL 1000 MG TABS: 1000 | 90 days supply | Qty: 180 | Fill #1

## 2015-10-27 MED FILL — BYSTOLIC 10 MG TABLET: 10 | 90 days supply | Qty: 90 | Fill #3

## 2015-10-27 MED FILL — CLOPIDOGREL 75 MG TABLET: 75 | 90 days supply | Qty: 90 | Fill #1

## 2015-10-30 ENCOUNTER — Other Ambulatory Visit: Payer: 59 | Admitting: Emergency Medicine

## 2015-10-30 ENCOUNTER — Ambulatory Visit (INDEPENDENT_AMBULATORY_CARE_PROVIDER_SITE_OTHER): Payer: 59 | Admitting: Emergency Medicine

## 2015-10-30 VITALS — BP 136/70 | HR 69 | Temp 98.1°F | Resp 16 | Ht 71.25 in | Wt 229.2 lb

## 2015-10-30 DIAGNOSIS — E0849 Diabetes mellitus due to underlying condition with other diabetic neurological complication: Secondary | ICD-10-CM | POA: Diagnosis not present

## 2015-10-30 DIAGNOSIS — Z7184 Encounter for health counseling related to travel: Secondary | ICD-10-CM

## 2015-10-30 DIAGNOSIS — E119 Type 2 diabetes mellitus without complications: Secondary | ICD-10-CM | POA: Diagnosis not present

## 2015-10-30 DIAGNOSIS — Z7189 Other specified counseling: Secondary | ICD-10-CM | POA: Diagnosis not present

## 2015-10-30 DIAGNOSIS — I10 Essential (primary) hypertension: Secondary | ICD-10-CM

## 2015-10-30 DIAGNOSIS — E785 Hyperlipidemia, unspecified: Secondary | ICD-10-CM

## 2015-10-30 LAB — POCT CBC
Granulocyte percent: 61.5 %G (ref 37–80)
HEMATOCRIT: 40.2 % — AB (ref 43.5–53.7)
Hemoglobin: 14.1 g/dL (ref 14.1–18.1)
Lymph, poc: 1.7 (ref 0.6–3.4)
MCH: 31.9 pg — AB (ref 27–31.2)
MCHC: 35.2 g/dL (ref 31.8–35.4)
MCV: 90.7 fL (ref 80–97)
MID (CBC): 0.5 (ref 0–0.9)
MPV: 6.6 fL (ref 0–99.8)
POC GRANULOCYTE: 3.4 (ref 2–6.9)
POC LYMPH %: 30.4 % (ref 10–50)
POC MID %: 8.1 % (ref 0–12)
Platelet Count, POC: 263 10*3/uL (ref 142–424)
RBC: 4.43 M/uL — AB (ref 4.69–6.13)
RDW, POC: 13 %
WBC: 5.6 10*3/uL (ref 4.6–10.2)

## 2015-10-30 LAB — LIPID PANEL
CHOL/HDL RATIO: 3.1 ratio (ref <=5.0)
Cholesterol: 151 mg/dL (ref 125–200)
HDL: 49 mg/dL (ref >=40)
LDL CALC: 56 mg/dL (ref <130)
TRIGLYCERIDES: 232 mg/dL — AB (ref <150)
VLDL: 46 mg/dL — AB (ref <30)

## 2015-10-30 LAB — BASIC METABOLIC PANEL WITH GFR
BUN: 22 mg/dL (ref 7–25)
CHLORIDE: 101 mmol/L (ref 98–110)
CO2: 23 mmol/L (ref 20–31)
Calcium: 10.2 mg/dL (ref 8.6–10.3)
Creat: 0.97 mg/dL (ref 0.70–1.25)
GFR, EST NON AFRICAN AMERICAN: 81 mL/min (ref >=60)
GFR, Est African American: >89 mL/min (ref >=60)
GLUCOSE: 141 mg/dL — AB (ref 65–99)
Potassium: 4.8 mmol/L (ref 3.5–5.3)
SODIUM: 138 mmol/L (ref 135–146)

## 2015-10-30 LAB — POCT GLYCOSYLATED HEMOGLOBIN (HGB A1C): Hemoglobin A1C: 6.7

## 2015-10-30 LAB — GLUCOSE, POCT (MANUAL RESULT ENTRY): POC Glucose: 152 mg/dl — AB (ref 70–99)

## 2015-10-30 NOTE — Progress Notes (Signed)
By signing my name below, I, Mesha Guinyard, attest that this document has been prepared under the direction and in the presence of Arlyss Queen. Electronically Signed: Verlee Monte, Medical Scribe 10/30/2015. 2:55 PM. Chief Complaint: No chief complaint on file.   HPI: Phillip [name] is a 65 y.o. male who reports to [facility] today complaining of a fungal infection on his left big toes. Pt has been using Penlac for his symptoms.  DM: Pt states his blood sugar has been around 160. Pt states his blood sugar was 137 this morning. Pt states his medication doesn't suspend easily. Pt uses Bydureon injection to control his DM. Pt feet have peripheral neuropathy, but he reports his feet are fine. Pt thinks it's due to taking statin. Pt states he has a hammer toe. Pt reports aching in his lower legs.  HTN: Pt reports hypotensive episode yesterday afernoon while he was weeding. Pt experienced dizziness when he got up. Pt was relieved after 10 mins when he sat down. Pt wont take Losartan if he knows it'll be hot that day. Pt will cool off and drink lots of fluids for relief to his symptoms.  Immunization: Pt would like to get his Typhoid and PNA vaccine.   Immunization History  Administered Date(s) Administered  . Influenza Split 02/01/2012  . Influenza-Unspecified 01/24/2014  . PPD Test 01/31/2014  . Pneumococcal Conjugate-13 07/25/2014  . Zoster 06/11/2010    Past Medical History  Diagnosis Date  . Anemia   . Arthritis   . Diabetes mellitus   . Hyperlipidemia   . Hypertension   . Sleep apnea     cpap  . Gout   . Osteoarthritis of left hip 11/20/2012  . Localized osteoarthritis of left knee 11/20/2012  . Neuromuscular disorder ([place])     " mild periferal neauropathy  . PVC (premature ventricular contraction)   . Migraine headache with aura    Past Surgical History  Procedure Laterality Date  . Cholecystectomy  1997  . Knee arthoscopy  2006    Left Knee  . Tonsillectomy and  adenoidectomy      Childhood  . Total hip arthroplasty Left 11/20/2012    Dr Mardelle Matte  . Replacement unicondylar joint knee Left 11/20/2012    Dr Mardelle Matte  . Partial knee arthroplasty Left 11/20/2012    Procedure: UNICOMPARTMENTAL KNEE;  Surgeon: Johnny Bridge, MD;  Location: [facility];  Service: Orthopedics;  Laterality: Left;  . Total hip arthroplasty Left 11/20/2012    Procedure: TOTAL HIP ARTHROPLASTY;  Surgeon: Johnny Bridge, MD;  Location: [facility];  Service: Orthopedics;  Laterality: Left;  . Tee without cardioversion N/A 04/19/2013    Procedure: TRANSESOPHAGEAL ECHOCARDIOGRAM (TEE);  Surgeon: Larey Dresser, MD;  Location: [facility];  Service: Cardiovascular;  Laterality: N/A;  . Cva      CVA, occiptal  . Cva      CVA, occipital  . Left total hip    . Left unicarpmental knee    . Loop recorder implant N/A 04/19/2013    Procedure: LOOP RECORDER IMPLANT;  Surgeon: Evans Lance, MD;  Location: [facility] CATH LAB;  Service: Cardiovascular;  Laterality: N/A;   Social History   Social History  . Marital Status: Married    Spouse Name: Learta Codding  . Number of Children: 4  . Years of Education: 23   Occupational History  . MD [employer]   Social History Main Topics  . Smoking status: Never Smoker   . Smokeless tobacco:  Never Used  . Alcohol Use: No  . Drug Use: No  . Sexual Activity: Not on file   Other Topics Concern  . Not on file   Social History Narrative   Patient lives at home with family.   Caffeine Use: occasionally tea   Family History  Problem Relation Age of Onset  . Cancer Mother     Breast  . Stroke Mother   . Stroke Maternal Grandmother   . Stroke Maternal Aunt   . Stroke Maternal Uncle    Allergies  Allergen Reactions  . Sulfa Antibiotics Other (See Comments)    Unknown reaction   Prior to Admission medications   Medication Sig Start Date End Date Taking? Authorizing Provider  acetaminophen (TYLENOL) 650 MG CR tablet Take 1,300 mg by mouth every 8 (eight)  hours as needed for pain.    Historical Provider, MD  allopurinol (ZYLOPRIM) 300 MG tablet Take 1 tablet (300 mg total) by mouth daily. 07/16/15   Elby Beck, FNP  blood glucose meter kit and supplies KIT Use up to four times daily as directed. Dx E13.40. 02/18/15   Darlyne Russian, MD  canagliflozin (INVOKANA) 300 MG TABS tablet Take 1 tablet (300 mg total) by mouth daily. 07/02/15   Tereasa Coop, PA-C  clopidogrel (PLAVIX) 75 MG tablet Take 1 tablet (75 mg total) by mouth daily with breakfast. 07/16/15   Elby Beck, FNP  doxycycline (VIBRA-TABS) 100 MG tablet Take 1 tablet daily for malaria prophylaxis. 09/12/15   Darlyne Russian, MD  Exenatide ER 2 MG PEN Inject 2 mg into the skin once a week. 09/14/15   Darlyne Russian, MD  glucose blood (TRUE METRIX BLOOD GLUCOSE TEST) test strip Use to check blood sugar up to 4 times daily. Dx : E13.40 07/21/15   Darlyne Russian, MD  losartan (COZAAR) 100 MG tablet Take 1 tablet (100 mg total) by mouth daily. 07/16/15   Elby Beck, FNP  metaxalone (SKELAXIN) 800 MG tablet Take 1 tablet (800 mg total) by mouth every 6 (six) hours as needed. 11/23/12   Kirstin Shepperson, PA-C  metFORMIN (GLUMETZA) 1000 MG (MOD) 24 hr tablet Take 1 tablet (1,000 mg total) by mouth 2 (two) times daily with a meal. 07/16/15   Elby Beck, FNP  nebivolol (BYSTOLIC) 10 MG tablet Take 10 mg by mouth daily.    Historical Provider, MD  rosuvastatin (CRESTOR) 5 MG tablet Take 1 tab daily Patient taking differently: 2 (two) times a week. Take 1 tab daily 04/19/15   Darlyne Russian, MD  SM LANCETS 33G MISC USE TO TEST BLOOD SUGAR TWICE DAILY 01/24/13   Darlyne Russian, MD  zolpidem (AMBIEN) 5 MG tablet TAKE 1 TABLET BY MOUTH ONCE DAILY AT BEDTIME AS NEEDED FOR SLEEP 08/30/15   Darlyne Russian, MD     ROS: The patient denies fevers, chills, night sweats, unintentional weight loss, chest pain, palpitations, wheezing, dyspnea on exertion, nausea, vomiting, abdominal pain, dysuria,  hematuria, melena, numbness, weakness, or tingling.  All other systems have been reviewed and were otherwise negative with the exception of those mentioned in the HPI and as above.    PHYSICAL EXAM: There were no vitals filed for this visit. There is no weight on file to calculate BMI.   General: Alert, no acute distress HEENT:  Normocephalic, atraumatic, oropharynx patent. Eye: [facility] Cardiovascular:  Regular rate and rhythm, no rubs murmurs or gallops.  No Carotid bruits, radial  pulse intact. No pedal edema.  Respiratory: Clear to auscultation bilaterally.  No wheezes, rales, or rhonchi.  No cyanosis, no use of accessory musculature Abdominal: No organomegaly, abdomen is soft and non-tender, positive bowel sounds.  No masses. Musculoskeletal: Gait intact. No edema, tenderness Skin: No rashes. Neurologic: Facial musculature symmetric. Psychiatric: Patient acts appropriately throughout our interaction. Lymphatic: No cervical or submandibular lymphadenopathy  LABS: Results for orders placed or performed in visit on 62/69/48  BASIC METABOLIC PANEL WITH GFR  Result Value Ref Range   Sodium 138 135 - 146 mmol/L   Potassium 4.8 3.5 - 5.3 mmol/L   Chloride 101 98 - 110 mmol/L   CO2 23 20 - 31 mmol/L   Glucose, Bld 141 (H) 65 - 99 mg/dL   BUN 22 7 - 25 mg/dL   Creat 0.97 0.70 - 1.25 mg/dL   Calcium 10.2 8.6 - 10.3 mg/dL   GFR, Est African American >89 >=60 mL/min   GFR, Est Non African American 81 >=60 mL/min  Lipid panel  Result Value Ref Range   Cholesterol 151 125 - 200 mg/dL   Triglycerides 232 (H) <150 mg/dL   HDL 49 >=40 mg/dL   Total CHOL/HDL Ratio 3.1 <=5.0 Ratio   VLDL 46 (H) <30 mg/dL   LDL Cholesterol 56 <130 mg/dL  POCT CBC  Result Value Ref Range   WBC 5.6 4.6 - 10.2 K/uL   Lymph, poc 1.7 0.6 - 3.4   POC LYMPH PERCENT 30.4 10 - 50 %L   MID (cbc) 0.5 0 - 0.9   POC MID % 8.1 0 - 12 %M   POC Granulocyte 3.4 2 - 6.9   Granulocyte percent 61.5 37 - 80 %G    RBC 4.43 (A) 4.69 - 6.13 M/uL   Hemoglobin 14.1 14.1 - 18.1 g/dL   HCT, POC 40.2 (A) 43.5 - 53.7 %   MCV 90.7 80 - 97 fL   MCH, POC 31.9 (A) 27 - 31.2 pg   MCHC 35.2 31.8 - 35.4 g/dL   RDW, POC 13.0 %   Platelet Count, POC 263 142 - 424 K/uL   MPV 6.6 0 - 99.8 fL  POCT glucose (manual entry)  Result Value Ref Range   POC Glucose 152 (A) 70 - 99 mg/dl  POCT glycosylated hemoglobin (Hb A1C)  Result Value Ref Range   Hemoglobin A1C 6.7    EKG/XRAY:   Primary read interpreted by Dr. Everlene Farrier at [facility].   ASSESSMENT/PLAN: Hemoglobin A1c is 6.7. This is the best he has been for years. LDL is significantly improved he will take his statin 2 days a week. Overall no change in medications.I personally performed the services described in this documentation, which was scribed in my presence. The recorded information has been reviewed and is accurate.   Gross sideeffects, risk and benefits, and alternatives of medications d/w patient. Patient is aware that all medications have potential sideeffects and we are unable to predict every sideeffect or drug-drug interaction that may occur.  Arlyss Queen MD 10/30/2015 3:02 PM

## 2015-10-31 MED ORDER — TYPHOID VACCINE PO CPDR: 1.0000 | DELAYED_RELEASE_CAPSULE | ORAL | Status: DC

## 2015-10-31 NOTE — Addendum Note (Signed)
Addended by: Arlyss Queen A on: 10/31/2015 07:25 AM   Modules accepted: Orders

## 2015-11-02 DIAGNOSIS — G4733 Obstructive sleep apnea (adult) (pediatric): Secondary | ICD-10-CM | POA: Diagnosis not present

## 2015-11-02 MED FILL — VIVOTIF EC CAPSULE: 8 days supply | Qty: 4 | Fill #0

## 2015-11-19 DIAGNOSIS — I472 Ventricular tachycardia: Secondary | ICD-10-CM | POA: Diagnosis not present

## 2015-11-19 DIAGNOSIS — Z4509 Encounter for adjustment and management of other cardiac device: Secondary | ICD-10-CM | POA: Diagnosis not present

## 2015-11-19 DIAGNOSIS — I10 Essential (primary) hypertension: Secondary | ICD-10-CM | POA: Diagnosis not present

## 2015-12-01 MED FILL — BYDUREON 2 MG PEN INJECT: 2 | 84 days supply | Qty: 12 | Fill #1

## 2015-12-03 DIAGNOSIS — G4733 Obstructive sleep apnea (adult) (pediatric): Secondary | ICD-10-CM | POA: Diagnosis not present

## 2015-12-10 MED FILL — INVOKANA 300 MG TABLET: 300 | 90 days supply | Qty: 90 | Fill #2

## 2015-12-11 MED FILL — ZOLPIDEM TARTRATE 5 MG TAB: 5 | 90 days supply | Qty: 90 | Fill #1

## 2015-12-20 DIAGNOSIS — Z4509 Encounter for adjustment and management of other cardiac device: Secondary | ICD-10-CM | POA: Diagnosis not present

## 2015-12-20 DIAGNOSIS — I472 Ventricular tachycardia: Secondary | ICD-10-CM | POA: Diagnosis not present

## 2015-12-20 DIAGNOSIS — I10 Essential (primary) hypertension: Secondary | ICD-10-CM | POA: Diagnosis not present

## 2016-01-03 DIAGNOSIS — G4733 Obstructive sleep apnea (adult) (pediatric): Secondary | ICD-10-CM | POA: Diagnosis not present

## 2016-01-07 ENCOUNTER — Encounter: Payer: Self-pay | Admitting: Emergency Medicine

## 2016-01-07 ENCOUNTER — Encounter: Payer: Self-pay | Admitting: Family Medicine

## 2016-01-07 DIAGNOSIS — H524 Presbyopia: Secondary | ICD-10-CM | POA: Diagnosis not present

## 2016-01-07 DIAGNOSIS — H35363 Drusen (degenerative) of macula, bilateral: Secondary | ICD-10-CM | POA: Diagnosis not present

## 2016-01-07 DIAGNOSIS — H2513 Age-related nuclear cataract, bilateral: Secondary | ICD-10-CM | POA: Diagnosis not present

## 2016-01-07 DIAGNOSIS — H52223 Regular astigmatism, bilateral: Secondary | ICD-10-CM | POA: Diagnosis not present

## 2016-01-07 DIAGNOSIS — H5213 Myopia, bilateral: Secondary | ICD-10-CM | POA: Diagnosis not present

## 2016-01-07 DIAGNOSIS — H15832 Staphyloma posticum, left eye: Secondary | ICD-10-CM | POA: Diagnosis not present

## 2016-01-07 DIAGNOSIS — Z7984 Long term (current) use of oral hypoglycemic drugs: Secondary | ICD-10-CM | POA: Diagnosis not present

## 2016-01-07 DIAGNOSIS — E119 Type 2 diabetes mellitus without complications: Secondary | ICD-10-CM | POA: Diagnosis not present

## 2016-01-07 LAB — HM DIABETES EYE EXAM

## 2016-01-20 DIAGNOSIS — E785 Hyperlipidemia, unspecified: Secondary | ICD-10-CM | POA: Diagnosis not present

## 2016-01-20 DIAGNOSIS — I10 Essential (primary) hypertension: Secondary | ICD-10-CM | POA: Diagnosis not present

## 2016-01-20 DIAGNOSIS — E668 Other obesity: Secondary | ICD-10-CM | POA: Diagnosis not present

## 2016-01-20 DIAGNOSIS — I472 Ventricular tachycardia: Secondary | ICD-10-CM | POA: Diagnosis not present

## 2016-01-20 DIAGNOSIS — I2510 Atherosclerotic heart disease of native coronary artery without angina pectoris: Secondary | ICD-10-CM | POA: Diagnosis not present

## 2016-01-20 DIAGNOSIS — Z4509 Encounter for adjustment and management of other cardiac device: Secondary | ICD-10-CM | POA: Diagnosis not present

## 2016-01-20 DIAGNOSIS — E119 Type 2 diabetes mellitus without complications: Secondary | ICD-10-CM | POA: Diagnosis not present

## 2016-01-26 ENCOUNTER — Other Ambulatory Visit: Payer: Self-pay | Admitting: Family Medicine

## 2016-01-26 DIAGNOSIS — M1000 Idiopathic gout, unspecified site: Secondary | ICD-10-CM

## 2016-01-26 MED FILL — BYSTOLIC 10 MG TABLET: 10 | 90 days supply | Qty: 90 | Fill #4

## 2016-01-26 MED FILL — DOXYCYCLINE HYCLATE 100 MG: 100 | 90 days supply | Qty: 90 | Fill #1

## 2016-01-26 MED FILL — ROSUVASTATIN CALCIUM 5 MG T: 5 | 90 days supply | Qty: 90 | Fill #3

## 2016-02-02 DIAGNOSIS — I472 Ventricular tachycardia: Secondary | ICD-10-CM | POA: Diagnosis not present

## 2016-02-02 DIAGNOSIS — I10 Essential (primary) hypertension: Secondary | ICD-10-CM | POA: Diagnosis not present

## 2016-02-03 ENCOUNTER — Other Ambulatory Visit: Payer: Self-pay

## 2016-02-03 ENCOUNTER — Other Ambulatory Visit: Payer: Self-pay | Admitting: Emergency Medicine

## 2016-02-03 ENCOUNTER — Telehealth: Payer: Self-pay | Admitting: Emergency Medicine

## 2016-02-03 DIAGNOSIS — M1000 Idiopathic gout, unspecified site: Secondary | ICD-10-CM

## 2016-02-03 MED ORDER — METFORMIN HCL ER (MOD) 1000 MG PO TB24: 1000.0000 mg | ORAL_TABLET | Freq: Two times a day (BID) | ORAL | 1 refills | Status: DC

## 2016-02-03 MED ORDER — CLOPIDOGREL BISULFATE 75 MG PO TABS: 75.0000 mg | ORAL_TABLET | Freq: Every day | ORAL | 1 refills | Status: DC

## 2016-02-03 MED ORDER — METFORMIN HCL ER 500 MG PO TB24: 1000.0000 mg | ORAL_TABLET | Freq: Two times a day (BID) | ORAL | 1 refills | Status: DC

## 2016-02-03 MED ORDER — LOSARTAN POTASSIUM 100 MG PO TABS: 100.0000 mg | ORAL_TABLET | Freq: Every day | ORAL | 1 refills | Status: DC

## 2016-02-03 MED ORDER — ALLOPURINOL 300 MG PO TABS: 300.0000 mg | ORAL_TABLET | Freq: Every day | ORAL | 1 refills | Status: DC

## 2016-02-03 MED FILL — METFORMIN HCL ER 500 MG TAB: 500 | 90 days supply | Qty: 360 | Fill #0

## 2016-02-03 MED FILL — CLOPIDOGREL 75 MG TABLET: 75 | 90 days supply | Qty: 90 | Fill #0

## 2016-02-03 MED FILL — ALLOPURINOL 300 MG TABLET: 300 | 90 days supply | Qty: 90 | Fill #0

## 2016-02-03 MED FILL — LOSARTAN POTASSIUM 100 MG T: 100 | 90 days supply | Qty: 90 | Fill #0

## 2016-02-03 NOTE — Telephone Encounter (Signed)
Pt has been scheduled.  °

## 2016-02-03 NOTE — Telephone Encounter (Signed)
Ok with me 

## 2016-02-03 NOTE — Telephone Encounter (Signed)
Pharm called and need for metformin to be resent as the correct glucophage ER not the Glumetza so that it will be the generic and affordable. Done.

## 2016-02-03 NOTE — Telephone Encounter (Signed)
Called in to transition care to Dr. Lorelei Pont.

## 2016-02-10 MED FILL — BYDUREON 2 MG PEN INJECT: 2 | 84 days supply | Qty: 12 | Fill #2

## 2016-02-11 DIAGNOSIS — G4733 Obstructive sleep apnea (adult) (pediatric): Secondary | ICD-10-CM | POA: Diagnosis not present

## 2016-02-19 DIAGNOSIS — E785 Hyperlipidemia, unspecified: Secondary | ICD-10-CM | POA: Diagnosis not present

## 2016-02-19 DIAGNOSIS — I2510 Atherosclerotic heart disease of native coronary artery without angina pectoris: Secondary | ICD-10-CM | POA: Diagnosis not present

## 2016-02-19 DIAGNOSIS — I10 Essential (primary) hypertension: Secondary | ICD-10-CM | POA: Diagnosis not present

## 2016-02-19 DIAGNOSIS — E668 Other obesity: Secondary | ICD-10-CM | POA: Diagnosis not present

## 2016-02-19 DIAGNOSIS — E119 Type 2 diabetes mellitus without complications: Secondary | ICD-10-CM | POA: Diagnosis not present

## 2016-02-19 DIAGNOSIS — I472 Ventricular tachycardia: Secondary | ICD-10-CM | POA: Diagnosis not present

## 2016-02-19 DIAGNOSIS — Z4509 Encounter for adjustment and management of other cardiac device: Secondary | ICD-10-CM | POA: Diagnosis not present

## 2016-03-01 ENCOUNTER — Other Ambulatory Visit: Payer: Self-pay | Admitting: Emergency Medicine

## 2016-03-02 NOTE — Telephone Encounter (Signed)
Last saw [facility] 6/30 and last Rx 4/30 #90 + 1 RF. appt sch with Dr Lorelei Pont to est care as PCP in Dec.

## 2016-03-04 ENCOUNTER — Other Ambulatory Visit: Payer: Self-pay

## 2016-03-04 MED ORDER — ZOLPIDEM TARTRATE 5 MG PO TABS: ORAL_TABLET | ORAL | 1 refills | Status: DC

## 2016-03-04 NOTE — Telephone Encounter (Signed)
Approval for Ambien 5mg .  Please call into [facility] as I will not be in the office this weekend.

## 2016-03-05 NOTE — Telephone Encounter (Signed)
Called to pharmacy 

## 2016-03-21 DIAGNOSIS — E119 Type 2 diabetes mellitus without complications: Secondary | ICD-10-CM | POA: Diagnosis not present

## 2016-03-21 DIAGNOSIS — I472 Ventricular tachycardia: Secondary | ICD-10-CM | POA: Diagnosis not present

## 2016-03-21 DIAGNOSIS — E668 Other obesity: Secondary | ICD-10-CM | POA: Diagnosis not present

## 2016-03-21 DIAGNOSIS — I10 Essential (primary) hypertension: Secondary | ICD-10-CM | POA: Diagnosis not present

## 2016-03-21 DIAGNOSIS — I2510 Atherosclerotic heart disease of native coronary artery without angina pectoris: Secondary | ICD-10-CM | POA: Diagnosis not present

## 2016-03-21 DIAGNOSIS — E785 Hyperlipidemia, unspecified: Secondary | ICD-10-CM | POA: Diagnosis not present

## 2016-03-21 DIAGNOSIS — Z4509 Encounter for adjustment and management of other cardiac device: Secondary | ICD-10-CM | POA: Diagnosis not present

## 2016-03-25 MED FILL — INVOKANA 300 MG TABLET: 300 | 90 days supply | Qty: 90 | Fill #3

## 2016-04-20 ENCOUNTER — Ambulatory Visit (INDEPENDENT_AMBULATORY_CARE_PROVIDER_SITE_OTHER): Payer: 59 | Admitting: Family Medicine

## 2016-04-20 ENCOUNTER — Encounter: Payer: Self-pay | Admitting: Family Medicine

## 2016-04-20 VITALS — BP 140/86 | HR 68 | Temp 98.2°F | Ht 71.25 in | Wt 230.8 lb

## 2016-04-20 DIAGNOSIS — Z23 Encounter for immunization: Secondary | ICD-10-CM | POA: Diagnosis not present

## 2016-04-20 DIAGNOSIS — E782 Mixed hyperlipidemia: Secondary | ICD-10-CM

## 2016-04-20 DIAGNOSIS — I10 Essential (primary) hypertension: Secondary | ICD-10-CM | POA: Diagnosis not present

## 2016-04-20 DIAGNOSIS — Z4509 Encounter for adjustment and management of other cardiac device: Secondary | ICD-10-CM | POA: Diagnosis not present

## 2016-04-20 DIAGNOSIS — E119 Type 2 diabetes mellitus without complications: Secondary | ICD-10-CM

## 2016-04-20 DIAGNOSIS — Z119 Encounter for screening for infectious and parasitic diseases, unspecified: Secondary | ICD-10-CM

## 2016-04-20 LAB — LIPID PANEL
CHOLESTEROL: 130 mg/dL (ref 0–200)
HDL: 48.9 mg/dL (ref >39.00)
LDL CALC: 50 mg/dL (ref 0–99)
NonHDL: 81.57
TRIGLYCERIDES: 156 mg/dL — AB (ref 0.0–149.0)
Total CHOL/HDL Ratio: 3
VLDL: 31.2 mg/dL (ref 0.0–40.0)

## 2016-04-20 LAB — HEMOGLOBIN A1C: Hgb A1c MFr Bld: 7.1 % — ABNORMAL HIGH (ref 4.6–6.5)

## 2016-04-20 LAB — COMPREHENSIVE METABOLIC PANEL
ALBUMIN: 4.4 g/dL (ref 3.5–5.2)
ALT: 17 U/L (ref 0–53)
AST: 14 U/L (ref 0–37)
Alkaline Phosphatase: 55 U/L (ref 39–117)
BUN: 24 mg/dL — AB (ref 6–23)
CALCIUM: 9.5 mg/dL (ref 8.4–10.5)
CHLORIDE: 103 meq/L (ref 96–112)
CO2: 23 mEq/L (ref 19–32)
Creatinine, Ser: 1 mg/dL (ref 0.40–1.50)
GFR: 79.15 mL/min (ref >60.00)
Glucose, Bld: 152 mg/dL — ABNORMAL HIGH (ref 70–99)
POTASSIUM: 4.1 meq/L (ref 3.5–5.1)
SODIUM: 136 meq/L (ref 135–145)
Total Bilirubin: 0.5 mg/dL (ref 0.2–1.2)
Total Protein: 7 g/dL (ref 6.0–8.3)

## 2016-04-20 LAB — HEPATITIS C ANTIBODY: HCV Ab: NEGATIVE

## 2016-04-20 MED ORDER — HYDROCHLOROTHIAZIDE 12.5 MG PO TABS: 12.5000 mg | ORAL_TABLET | Freq: Every day | ORAL | 3 refills | Status: DC

## 2016-04-20 MED FILL — HYDROCHLOROTHIAZIDE 12.5 MG: 12.5 | 90 days supply | Qty: 90 | Fill #0

## 2016-04-20 NOTE — Progress Notes (Signed)
[name] [address], [place], [place] [address] [phone]  Date:  04/20/2016   Name:  Phillip [name]   DOB:  [birth year]   MRN:  [id number]  PCP:  Lamar Blinks, MD    Chief Complaint: Establish Care (Pt here to est care. Former pt of Dr. Everlene Farrier. )   History of Present Illness:  Phillip [name] is a 65 y.o. very pleasant male patient who presents with the following:  Here today to establish care with myself- former pt of Dr. Everlene Farrier who is retiring soon History of CAD, OSA, stroke in 2014 (no residual), HTN, DM 2, hyperlipidemia He has been working on losing some weight- does tend to fluctuate.   He recently went on a trip to [place], [place], [place] (multiple destinations).  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   He and his wife Phillip [name] have 4 children- 3 daughters and a son.    We went over his pmhx- he has a document for my review today.  Cardiologist is Dr. Wynonia Lawman. He sees him about twice a day  Lab Results  Component Value Date   HGBA1C 6.7 10/30/2015   Lipids:    Component Value Date/Time   CHOL 151 10/30/2015 0828   TRIG 232 (H) 10/30/2015 0828   HDL 49 10/30/2015 0828   VLDL 46 (H) 10/30/2015 0828   CHOLHDL 3.1 10/30/2015 0828    Had prevnar 13 last year- would like to have pneumovax today  BP Readings from Last 3 Encounters:  04/20/16 140/86  10/30/15 136/70  08/12/15 (!) 152/90     Patient Active Problem List   Diagnosis Date Noted  . OSA on CPAP 08/12/2015  . Back pain 02/18/2015  . Ventricular tachycardia ([place]) 11/10/2014  . CAD (coronary artery disease), native coronary artery   . Variants of migraine, not elsewhere classified, without mention of intractable migraine without mention of status migrainosus 08/14/2013  . History of ischemic stroke without residual deficits 04/17/2013  . ED (erectile dysfunction) 02/01/2012  . Gout 09/05/2011  .  Hyperlipidemia 09/05/2011  . Hypertriglyceridemia 09/05/2011  . Arthritis 09/05/2011  . PVC (premature ventricular contraction) 09/05/2011  . Type II or unspecified type diabetes mellitus without mention of complication, not stated as uncontrolled 06/01/2011  . Essential hypertension, benign 06/01/2011  . OSA (obstructive sleep apnea) 06/01/2011    Past Medical History:  Diagnosis Date  . Anemia   . Arthritis   . Diabetes mellitus   . Gout   . Hyperlipidemia   . Hypertension   . Localized osteoarthritis of left knee 11/20/2012  . Migraine headache with aura   . Neuromuscular disorder ([place])    " mild periferal neauropathy  . Osteoarthritis of left hip 11/20/2012  . PVC (premature ventricular contraction)   . Sleep apnea    cpap    Past Surgical History:  Procedure Laterality Date  . CHOLECYSTECTOMY  1997  . CVA     CVA, occiptal  . CVA     CVA, occipital  . knee arthoscopy  2006   Left Knee  . left total hip    . left unicarpmental knee    . LOOP RECORDER IMPLANT N/A 04/19/2013   Procedure: LOOP RECORDER IMPLANT;  Surgeon: Evans Lance, MD;  Location[facility] CATH LAB;  Service: Cardiovascular;  Laterality: N/A;  . PARTIAL KNEE ARTHROPLASTY Left 11/20/2012   Procedure: UNICOMPARTMENTAL KNEE;  Surgeon: Johnny Bridge, MD;  Location: [facility];  Service: Orthopedics;  Laterality: Left;  . REPLACEMENT UNICONDYLAR JOINT KNEE Left 11/20/2012   Dr Mardelle Matte  . TEE WITHOUT CARDIOVERSION N/A 04/19/2013   Procedure: TRANSESOPHAGEAL ECHOCARDIOGRAM (TEE);  Surgeon: Larey Dresser, MD;  Location: [facility];  Service: Cardiovascular;  Laterality: N/A;  . TONSILLECTOMY AND ADENOIDECTOMY     Childhood  . TOTAL HIP ARTHROPLASTY Left 11/20/2012   Dr Mardelle Matte  . TOTAL HIP ARTHROPLASTY Left 11/20/2012   Procedure: TOTAL HIP ARTHROPLASTY;  Surgeon: Johnny Bridge, MD;  Location: [facility];  Service: Orthopedics;  Laterality: Left;    Social History  Substance Use Topics  . Smoking status: Never  Smoker  . Smokeless tobacco: Never Used  . Alcohol use No    Family History  Problem Relation Age of Onset  . Cancer Mother     Breast  . Stroke Mother   . Stroke Maternal Grandmother   . Stroke Maternal Aunt   . Stroke Maternal Uncle     Allergies  Allergen Reactions  . Sulfa Antibiotics Other (See Comments)    Unknown reaction    Medication list has been reviewed and updated.  Current Outpatient Prescriptions on File Prior to Visit  Medication Sig Dispense Refill  . acetaminophen (TYLENOL) 650 MG CR tablet Take 1,300 mg by mouth every 8 (eight) hours as needed for pain.    [name] allopurinol (ZYLOPRIM) 300 MG tablet Take 1 tablet (300 mg total) by mouth daily. 90 tablet 1  . blood glucose meter kit and supplies KIT Use up to four times daily as directed. Dx E13.40. 1 each 0  . canagliflozin (INVOKANA) 300 MG TABS tablet Take 1 tablet (300 mg total) by mouth daily. 90 tablet 3  . clopidogrel (PLAVIX) 75 MG tablet Take 1 tablet (75 mg total) by mouth daily with breakfast. 90 tablet 1  . Exenatide ER 2 MG PEN Inject 2 mg into the skin once a week. 13 each 3  . glucose blood (TRUE METRIX BLOOD GLUCOSE TEST) test strip Use to check blood sugar up to 4 times daily. Dx : E13.40 400 each 3  . losartan (COZAAR) 100 MG tablet Take 1 tablet (100 mg total) by mouth daily. 90 tablet 1  . metaxalone (SKELAXIN) 800 MG tablet Take 1 tablet (800 mg total) by mouth every 6 (six) hours as needed. 60 tablet 1  . metFORMIN (GLUCOPHAGE-XR) 500 MG 24 hr tablet Take 2 tablets (1,000 mg total) by mouth 2 (two) times daily with a meal. 360 tablet 1  . nebivolol (BYSTOLIC) 10 MG tablet Take 10 mg by mouth daily.    . rosuvastatin (CRESTOR) 5 MG tablet Take 1 tab daily (Patient taking differently: 2 (two) times a week. Take 1 tab daily) 90 tablet 3  . SM LANCETS 33G MISC USE TO TEST BLOOD SUGAR TWICE DAILY 400 each PRN  . typhoid (VIVOTIF) DR capsule Take 1 capsule by mouth every other day. 4 capsule 0  .  zolpidem (AMBIEN) 5 MG tablet TAKE 1 TABLET BY MOUTH EACH NIGHT AT BEDTIME AS NEEDED FOR SLEEP 30 tablet 1   No current facility-administered medications on file prior to visit.     Review of Systems:  As per HPI- otherwise negative.   Physical Examination: Vitals:   04/20/16 0841  BP: 140/86  Pulse: 68  Temp: 98.2 F (36.8 C)   Vitals:   04/20/16 0841  Weight: 230 lb 12.8 oz (104.7 kg)  Height:  5' 11.25" (1.81 m)   Body mass index is 31.96 kg/m. Ideal Body Weight: Weight in (lb) to have BMI = 25: 180.1  GEN: WDWN, NAD, Non-toxic, A & O x 3, looks well HEENT: Atraumatic, Normocephalic. Neck supple. No masses, No LAD. Ears and Nose: No external deformity. CV: RRR, No M/G/R. No JVD. No thrill. No extra heart sounds. PULM: CTA B, no wheezes, crackles, rhonchi. No retractions. No resp. distress. No accessory muscle use. ABD: S, NT, ND, +BS. No rebound. No HSM. EXTR: No c/c/e NEURO Normal gait.  PSYCH: Normally interactive. Conversant. Not depressed or anxious appearing.  Calm demeanor.    Assessment and Plan: Type 2 diabetes mellitus without complication, without long-term current use of insulin (HCC) - Plan: Hemoglobin A1c, Hemoglobin A1C  Essential hypertension - Plan: Comprehensive metabolic panel, hydrochlorothiazide (HYDRODIURIL) 12.5 MG tablet  Mixed hyperlipidemia - Plan: Lipid panel  Immunization due - Plan: Tdap vaccine greater than or equal to 7yo IM, Pneumococcal polysaccharide vaccine 23-valent greater than or equal to 2yo subcutaneous/IM  Screening examination for infectious disease - Plan: Hepatitis C antibody  Here today to establish care and   Signed Lamar Blinks, MD

## 2016-04-20 NOTE — Progress Notes (Signed)
[facility] [address], [place], [place] [address] [phone]  Date:  04/20/2016   Name:  Phillip [name]   DOB:  [birth year]   MRN:  [id number]  PCP:  Lamar Blinks, MD    Chief Complaint: Establish Care (Pt here to est care. Former pt of Dr. Everlene Farrier. )   History of Present Illness:  Phillip [name] is a 65 y.o. very pleasant male patient who presents with the following:  Here today to establish care with myself- former pt of Dr. Everlene Farrier who is retiring soon History of CAD, OSA, stroke in 2014 (no residual), HTN, DM 2, hyperlipidemia, occasional migraine HA He has been working on losing some weight- does tend to fluctuate and it can be a struggle for him to maintain where he wants to be He often does medical mission - mostly to [place].  He recently went on a long trip to [place], [place], [place] (multiple destinations).  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    We went over his pmhx- he has a document for my review today.  Cardiologist is Dr. Wynonia Lawman. He sees him about twice a year Optho: [facility]: International aid/development worker for sleep  He has a couple of skin concerns today- 2 AKs on his scalp and a seb K on his right cheekl; would like to freeze these  He is also not certain of the date of his last tetanus or if he had a tdap- would like to boost this and have pneumovax 23 today prevnar done last years   Lab Results  Component Value Date   HGBA1C 6.7 10/30/2015   Lipids:    Component Value Date/Time   CHOL 151 10/30/2015 0828   TRIG 232 (H) 10/30/2015 0828   HDL 49 10/30/2015 0828   VLDL 46 (H) 10/30/2015 0828   CHOLHDL 3.1 10/30/2015 0828    He exercises by walking, no alcohol or tobacco He has noted his BP being on the high side lately- notes that during the warm months his BP will run lower as he is working  outdoors and sweating more.  Wonders if we might try HCTZ just during the cooler months.  He is able to check his BP at home  BP Readings from Last 3 Encounters:  04/20/16 140/86  10/30/15 136/70  08/12/15 (!) 152/90     Patient Active Problem List   Diagnosis Date Noted  . OSA on CPAP 08/12/2015  . Back pain 02/18/2015  . Ventricular tachycardia ([place]) 11/10/2014  . CAD (coronary artery disease), native coronary artery   . Variants of migraine, not elsewhere classified, without mention of intractable migraine without mention of status migrainosus 08/14/2013  . History of ischemic stroke without residual deficits 04/17/2013  . ED (erectile dysfunction) 02/01/2012  . Gout 09/05/2011  . Hyperlipidemia 09/05/2011  . Hypertriglyceridemia 09/05/2011  . Arthritis 09/05/2011  . PVC (premature ventricular contraction) 09/05/2011  . Type II or unspecified type diabetes mellitus without mention of complication, not stated as uncontrolled 06/01/2011  . Essential hypertension, benign 06/01/2011  . OSA (obstructive sleep apnea) 06/01/2011    Past Medical History:  Diagnosis Date  . Anemia   . Arthritis   . Diabetes mellitus   . Gout   . Hyperlipidemia   . Hypertension   . Localized  osteoarthritis of left knee 11/20/2012  . Migraine headache with aura   . Neuromuscular disorder ([place])    " mild periferal neauropathy  . Osteoarthritis of left hip 11/20/2012  . PVC (premature ventricular contraction)   . Sleep apnea    cpap    Past Surgical History:  Procedure Laterality Date  . CHOLECYSTECTOMY  1997  . CVA     CVA, occiptal  . CVA     CVA, occipital  . knee arthoscopy  2006   Left Knee  . left total hip    . left unicarpmental knee    . LOOP RECORDER IMPLANT N/A 04/19/2013   Procedure: LOOP RECORDER IMPLANT;  Surgeon: Evans Lance, MD;  Location: [facility] CATH LAB;  Service: Cardiovascular;  Laterality: N/A;  . PARTIAL KNEE ARTHROPLASTY Left 11/20/2012   Procedure:  UNICOMPARTMENTAL KNEE;  Surgeon: Johnny Bridge, MD;  Location: [place];  Service: Orthopedics;  Laterality: Left;  . REPLACEMENT UNICONDYLAR JOINT KNEE Left 11/20/2012   Dr Mardelle Matte  . TEE WITHOUT CARDIOVERSION N/A 04/19/2013   Procedure: TRANSESOPHAGEAL ECHOCARDIOGRAM (TEE);  Surgeon: Larey Dresser, MD;  Location: [place];  Service: Cardiovascular;  Laterality: N/A;  . TONSILLECTOMY AND ADENOIDECTOMY     Childhood  . TOTAL HIP ARTHROPLASTY Left 11/20/2012   Dr Mardelle Matte  . TOTAL HIP ARTHROPLASTY Left 11/20/2012   Procedure: TOTAL HIP ARTHROPLASTY;  Surgeon: Johnny Bridge, MD;  Location: [place];  Service: Orthopedics;  Laterality: Left;    Social History  Substance Use Topics  . Smoking status: Never Smoker  . Smokeless tobacco: Never Used  . Alcohol use No    Family History  Problem Relation Age of Onset  . Cancer Mother     Breast  . Stroke Mother   . Stroke Maternal Grandmother   . Stroke Maternal Aunt   . Stroke Maternal Uncle     Allergies  Allergen Reactions  . Sulfa Antibiotics Other (See Comments)    Unknown reaction    Medication list has been reviewed and updated.  Current Outpatient Prescriptions on File Prior to Visit  Medication Sig Dispense Refill  . acetaminophen (TYLENOL) 650 MG CR tablet Take 1,300 mg by mouth every 8 (eight) hours as needed for pain.    [name] allopurinol (ZYLOPRIM) 300 MG tablet Take 1 tablet (300 mg total) by mouth daily. 90 tablet 1  . blood glucose meter kit and supplies KIT Use up to four times daily as directed. Dx E13.40. 1 each 0  . canagliflozin (INVOKANA) 300 MG TABS tablet Take 1 tablet (300 mg total) by mouth daily. 90 tablet 3  . clopidogrel (PLAVIX) 75 MG tablet Take 1 tablet (75 mg total) by mouth daily with breakfast. 90 tablet 1  . Exenatide ER 2 MG PEN Inject 2 mg into the skin once a week. 13 each 3  . glucose blood (TRUE METRIX BLOOD GLUCOSE TEST) test strip Use to check blood sugar up to 4 times daily. Dx : E13.40 400  each 3  . losartan (COZAAR) 100 MG tablet Take 1 tablet (100 mg total) by mouth daily. 90 tablet 1  . metaxalone (SKELAXIN) 800 MG tablet Take 1 tablet (800 mg total) by mouth every 6 (six) hours as needed. 60 tablet 1  . metFORMIN (GLUCOPHAGE-XR) 500 MG 24 hr tablet Take 2 tablets (1,000 mg total) by mouth 2 (two) times daily with a meal. 360 tablet 1  . nebivolol (BYSTOLIC) 10 MG tablet Take 10 mg by mouth daily.    [name]  rosuvastatin (CRESTOR) 5 MG tablet Take 1 tab daily (Patient taking differently: 2 (two) times a week. Take 1 tab daily) 90 tablet 3  . SM LANCETS 33G MISC USE TO TEST BLOOD SUGAR TWICE DAILY 400 each PRN  . typhoid (VIVOTIF) DR capsule Take 1 capsule by mouth every other day. 4 capsule 0  . zolpidem (AMBIEN) 5 MG tablet TAKE 1 TABLET BY MOUTH EACH NIGHT AT BEDTIME AS NEEDED FOR SLEEP 30 tablet 1   No current facility-administered medications on file prior to visit.     Review of Systems:  As per HPI- otherwise negative.   Physical Examination: Vitals:   04/20/16 0841  BP: 140/86  Pulse: 68  Temp: 98.2 F (36.8 C)   Vitals:   04/20/16 0841  Weight: 230 lb 12.8 oz (104.7 kg)  Height: 5' 11.25" (1.81 m)   Body mass index is 31.96 kg/m. Ideal Body Weight: Weight in (lb) to have BMI = 25: 180.1  GEN: WDWN, NAD, Non-toxic, A & O x 3, looks well HEENT: Atraumatic, Normocephalic. Neck supple. No masses, No LAD.  Bilateral TM wnl, oropharynx normal.  PEERL,EOMI.   Ears and Nose: No external deformity. CV: RRR, No M/G/R. No JVD. No thrill. No extra heart sounds. PULM: CTA B, no wheezes, crackles, rhonchi. No retractions. No resp. distress. No accessory muscle use. ABD: S, NT, ND, +BS. No rebound. No HSM. EXTR: No c/c/e NEURO Normal gait.  PSYCH: Normally interactive. Conversant. Not depressed or anxious appearing.  Calm demeanor.  Froze 2 SKs on the scalp and one seb k on the right cheek x3 with liquid nitrogen   Assessment and Plan: Type 2 diabetes mellitus  without complication, without long-term current use of insulin (HCC) - Plan: Hemoglobin A1c, Hemoglobin A1C  Essential hypertension - Plan: Comprehensive metabolic panel, hydrochlorothiazide (HYDRODIURIL) 12.5 MG tablet  Mixed hyperlipidemia - Plan: Lipid panel  Immunization due - Plan: Tdap vaccine greater than or equal to 7yo IM, Pneumococcal polysaccharide vaccine 23-valent greater than or equal to 2yo subcutaneous/IM  Screening examination for infectious disease - Plan: Hepatitis C antibody  Here today to establish care and discuss a few chronic medical issues Check labs today- will be in touch with results asap Will use hctz 12.5 if needed for elevated BP   Signed Lamar Blinks, MD

## 2016-04-20 NOTE — Progress Notes (Signed)
Pre visit review using our clinic review tool, if applicable. No additional management support is needed unless otherwise documented below in the visit note. 

## 2016-04-20 NOTE — Patient Instructions (Addendum)
It was great to see you today- say hello to everyone at home and I hope you have a wonderful Christmas. Will be in touch with your labs asap.  If you want to add hctz 12.5 during the colder months I think that is fine- keep me posted about any concerns with your BP

## 2016-05-02 ENCOUNTER — Other Ambulatory Visit: Payer: Self-pay | Admitting: Emergency Medicine

## 2016-05-02 MED FILL — CLOPIDOGREL 75 MG TABLET: 75 | 90 days supply | Qty: 90 | Fill #1

## 2016-05-02 MED FILL — ALLOPURINOL 300 MG TABLET: 300 | 90 days supply | Qty: 90 | Fill #1

## 2016-05-02 MED FILL — METFORMIN HCL ER 500 MG TAB: 500 | 90 days supply | Qty: 360 | Fill #1

## 2016-05-02 MED FILL — TRUE METRIX GLUCOSE TEST ST: 90 days supply | Qty: 400 | Fill #1

## 2016-05-02 MED FILL — ZOLPIDEM TARTRATE 5 MG TAB: 5 | 30 days supply | Qty: 30 | Fill #0

## 2016-05-03 ENCOUNTER — Encounter: Payer: Self-pay | Admitting: Family Medicine

## 2016-05-03 ENCOUNTER — Other Ambulatory Visit: Payer: Self-pay

## 2016-05-03 ENCOUNTER — Other Ambulatory Visit: Payer: Self-pay | Admitting: Emergency Medicine

## 2016-05-03 MED ORDER — ROSUVASTATIN CALCIUM 5 MG PO TABS: ORAL_TABLET | ORAL | 3 refills | Status: DC

## 2016-05-03 MED ORDER — ZOLPIDEM TARTRATE 5 MG PO TABS: ORAL_TABLET | ORAL | 1 refills | Status: DC

## 2016-05-03 MED FILL — BYSTOLIC 10 MG TABLET: 10 | 90 days supply | Qty: 90 | Fill #0

## 2016-05-03 NOTE — Telephone Encounter (Signed)
Received refill request for Zolpidem Tartrate 5 MG Tab. Last office visit 04/20/16 and last refill 03/04/16. Is it ok to refill? Please advise.

## 2016-05-03 NOTE — Telephone Encounter (Signed)
03/2016 last refill 04/20/16 last ov

## 2016-05-09 MED FILL — LOSARTAN POTASSIUM 100 MG T: 100 | 90 days supply | Qty: 90 | Fill #1

## 2016-05-17 ENCOUNTER — Telehealth: Payer: Self-pay | Admitting: Internal Medicine

## 2016-05-17 NOTE — Telephone Encounter (Signed)
Unable to reach the patient via phone. Called and left message to call back to the hospital. Received text page regarding patient having 1 or more episodes of pauses.

## 2016-05-20 ENCOUNTER — Other Ambulatory Visit: Payer: Self-pay | Admitting: Family Medicine

## 2016-05-21 DIAGNOSIS — I2510 Atherosclerotic heart disease of native coronary artery without angina pectoris: Secondary | ICD-10-CM | POA: Diagnosis not present

## 2016-05-21 DIAGNOSIS — I10 Essential (primary) hypertension: Secondary | ICD-10-CM | POA: Diagnosis not present

## 2016-05-21 DIAGNOSIS — E668 Other obesity: Secondary | ICD-10-CM | POA: Diagnosis not present

## 2016-05-21 DIAGNOSIS — I472 Ventricular tachycardia: Secondary | ICD-10-CM | POA: Diagnosis not present

## 2016-05-21 DIAGNOSIS — E119 Type 2 diabetes mellitus without complications: Secondary | ICD-10-CM | POA: Diagnosis not present

## 2016-05-21 DIAGNOSIS — Z4509 Encounter for adjustment and management of other cardiac device: Secondary | ICD-10-CM | POA: Diagnosis not present

## 2016-05-21 DIAGNOSIS — E785 Hyperlipidemia, unspecified: Secondary | ICD-10-CM | POA: Diagnosis not present

## 2016-05-23 MED FILL — BYDUREON 2 MG PEN INJECT: 2 | 84 days supply | Qty: 12 | Fill #3

## 2016-06-01 MED FILL — ZOLPIDEM TARTRATE 5 MG TAB: 5 | 30 days supply | Qty: 30 | Fill #1

## 2016-06-06 ENCOUNTER — Other Ambulatory Visit: Payer: Self-pay | Admitting: Physician Assistant

## 2016-06-06 ENCOUNTER — Other Ambulatory Visit: Payer: Self-pay | Admitting: Family Medicine

## 2016-06-06 DIAGNOSIS — M1000 Idiopathic gout, unspecified site: Secondary | ICD-10-CM

## 2016-06-09 ENCOUNTER — Other Ambulatory Visit: Payer: Self-pay | Admitting: Emergency Medicine

## 2016-06-09 DIAGNOSIS — M1000 Idiopathic gout, unspecified site: Secondary | ICD-10-CM

## 2016-06-09 MED ORDER — CANAGLIFLOZIN 300 MG PO TABS: 300.0000 mg | ORAL_TABLET | Freq: Every day | ORAL | 3 refills | Status: DC

## 2016-06-09 MED ORDER — METFORMIN HCL ER 500 MG PO TB24: 1000.0000 mg | ORAL_TABLET | Freq: Two times a day (BID) | ORAL | 1 refills | Status: DC

## 2016-06-09 MED ORDER — LOSARTAN POTASSIUM 100 MG PO TABS: 100.0000 mg | ORAL_TABLET | Freq: Every day | ORAL | 1 refills | Status: DC

## 2016-06-09 MED ORDER — ALLOPURINOL 300 MG PO TABS: 300.0000 mg | ORAL_TABLET | Freq: Every day | ORAL | 1 refills | Status: DC

## 2016-06-09 MED ORDER — CLOPIDOGREL BISULFATE 75 MG PO TABS: 75.0000 mg | ORAL_TABLET | Freq: Every day | ORAL | 1 refills | Status: DC

## 2016-06-13 MED FILL — INVOKANA 300 MG TABLET: 300 | 90 days supply | Qty: 90 | Fill #0

## 2016-06-21 DIAGNOSIS — I472 Ventricular tachycardia: Secondary | ICD-10-CM | POA: Diagnosis not present

## 2016-06-21 DIAGNOSIS — E785 Hyperlipidemia, unspecified: Secondary | ICD-10-CM | POA: Diagnosis not present

## 2016-06-21 DIAGNOSIS — E119 Type 2 diabetes mellitus without complications: Secondary | ICD-10-CM | POA: Diagnosis not present

## 2016-06-21 DIAGNOSIS — I2510 Atherosclerotic heart disease of native coronary artery without angina pectoris: Secondary | ICD-10-CM | POA: Diagnosis not present

## 2016-06-21 DIAGNOSIS — Z4509 Encounter for adjustment and management of other cardiac device: Secondary | ICD-10-CM | POA: Diagnosis not present

## 2016-06-21 DIAGNOSIS — I10 Essential (primary) hypertension: Secondary | ICD-10-CM | POA: Diagnosis not present

## 2016-06-21 DIAGNOSIS — E668 Other obesity: Secondary | ICD-10-CM | POA: Diagnosis not present

## 2016-07-21 DIAGNOSIS — I2510 Atherosclerotic heart disease of native coronary artery without angina pectoris: Secondary | ICD-10-CM | POA: Diagnosis not present

## 2016-07-21 DIAGNOSIS — I472 Ventricular tachycardia: Secondary | ICD-10-CM | POA: Diagnosis not present

## 2016-07-21 DIAGNOSIS — I10 Essential (primary) hypertension: Secondary | ICD-10-CM | POA: Diagnosis not present

## 2016-07-21 DIAGNOSIS — E668 Other obesity: Secondary | ICD-10-CM | POA: Diagnosis not present

## 2016-07-21 DIAGNOSIS — E119 Type 2 diabetes mellitus without complications: Secondary | ICD-10-CM | POA: Diagnosis not present

## 2016-07-21 DIAGNOSIS — E785 Hyperlipidemia, unspecified: Secondary | ICD-10-CM | POA: Diagnosis not present

## 2016-07-21 DIAGNOSIS — Z4509 Encounter for adjustment and management of other cardiac device: Secondary | ICD-10-CM | POA: Diagnosis not present

## 2016-08-01 DIAGNOSIS — E668 Other obesity: Secondary | ICD-10-CM | POA: Diagnosis not present

## 2016-08-01 DIAGNOSIS — I2510 Atherosclerotic heart disease of native coronary artery without angina pectoris: Secondary | ICD-10-CM | POA: Diagnosis not present

## 2016-08-01 DIAGNOSIS — Z4509 Encounter for adjustment and management of other cardiac device: Secondary | ICD-10-CM | POA: Diagnosis not present

## 2016-08-01 DIAGNOSIS — E785 Hyperlipidemia, unspecified: Secondary | ICD-10-CM | POA: Diagnosis not present

## 2016-08-01 DIAGNOSIS — E119 Type 2 diabetes mellitus without complications: Secondary | ICD-10-CM | POA: Diagnosis not present

## 2016-08-01 DIAGNOSIS — I10 Essential (primary) hypertension: Secondary | ICD-10-CM | POA: Diagnosis not present

## 2016-08-01 DIAGNOSIS — I472 Ventricular tachycardia: Secondary | ICD-10-CM | POA: Diagnosis not present

## 2016-08-01 MED FILL — CLOPIDOGREL 75 MG TABLET: 75 | 90 days supply | Qty: 90 | Fill #0

## 2016-08-01 MED FILL — LOSARTAN POTASSIUM 100 MG T: 100 | 90 days supply | Qty: 90 | Fill #0

## 2016-08-01 MED FILL — BYSTOLIC 10 MG TABLET: 10 | 90 days supply | Qty: 90 | Fill #0

## 2016-08-05 ENCOUNTER — Encounter: Payer: Self-pay | Admitting: Family Medicine

## 2016-08-05 DIAGNOSIS — Z7184 Encounter for health counseling related to travel: Secondary | ICD-10-CM

## 2016-08-05 DIAGNOSIS — M1000 Idiopathic gout, unspecified site: Secondary | ICD-10-CM

## 2016-08-05 DIAGNOSIS — E119 Type 2 diabetes mellitus without complications: Secondary | ICD-10-CM

## 2016-08-08 MED ORDER — METFORMIN HCL ER (MOD) 1000 MG PO TB24: 1000.0000 mg | ORAL_TABLET | Freq: Two times a day (BID) | ORAL | 3 refills | Status: DC

## 2016-08-08 MED ORDER — ALLOPURINOL 300 MG PO TABS: 300.0000 mg | ORAL_TABLET | Freq: Every day | ORAL | 3 refills | Status: DC

## 2016-08-08 MED FILL — ALLOPURINOL 300 MG TAB: 300 | 90 days supply | Qty: 90 | Fill #0

## 2016-08-10 MED FILL — BYDUREON 2 MG PEN INJECT: 2 | 28 days supply | Qty: 4 | Fill #4

## 2016-08-11 ENCOUNTER — Ambulatory Visit (INDEPENDENT_AMBULATORY_CARE_PROVIDER_SITE_OTHER): Payer: 59 | Admitting: Neurology

## 2016-08-11 ENCOUNTER — Encounter: Payer: Self-pay | Admitting: Neurology

## 2016-08-11 VITALS — BP 128/62 | HR 78 | Resp 18 | Ht 71.25 in | Wt 230.0 lb

## 2016-08-11 DIAGNOSIS — Z9989 Dependence on other enabling machines and devices: Secondary | ICD-10-CM

## 2016-08-11 DIAGNOSIS — G4733 Obstructive sleep apnea (adult) (pediatric): Secondary | ICD-10-CM | POA: Diagnosis not present

## 2016-08-11 MED FILL — METFORMIN HCL ER 500 MG TAB: 500 | 90 days supply | Qty: 360 | Fill #0

## 2016-08-11 NOTE — Progress Notes (Signed)
SLEEP MEDICINE CLINIC   Provider:  Larey [name], M D  Referring Provider: Darlyne Russian, MD Primary Care Physician:  Phillip Blinks, MD  Chief Complaint  Patient presents with  . Follow-up    Rm 11. Patient states that he is doing well with CPAP. No new concerns.     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 Everlene [name] for OSA , transfer of sleep care. The patient suffered an occipital stroke in 2014. He has a long history of migraines. He had a last sleep study in [place], by Dr. Scarlette [name]. Phillip [name] suffered the occipital stroke realizing that he had a similar visual aura to a migraine manifestation. This remains worrisome for him as he should not take Triptan- medication.After his presumed visual aura persisted for about 14 hours he was seen by one of his colleagues in the same office, Dr. Arlyss [name], and initiated a stroke workup.Phillip [name] also suffers from diabetes and has a diabetic induced neuropathy on the bottom of both feet. He is still taking Onglyza,. He is seen today because he had not had a repeat sleep study for the last 12 years. He has been on the same setting and on the same type CPAP machine. He is interested in reevaluating his sleep apnea and treatment options.He recalls that about 8 years ago he was on a medical mission and used his original CPAP machine on a car battery and it broke. He replaced the  the same type however at the same settings.   Interval history from 08/12/2015 Phillip [name] is here after his recent sleep study performed on 05/13/2015, during which he was diagnosed again with sleep apnea at an AHI of 49.8 no REM sleep was noted during the diagnostic part, supine AHI was 54.8 and nonsupine 27.3 average end tidal CO2 was 30.64 torr, no prolonged oxygen desaturations were noted. Occasional premature ventricular beats were seen, the patient was titrated to CPAP and was doing well on 11 cm water pressure. A 2 cm flex  function was requested. Today we also having  the opportunity to see the first download residual AHI is 2.6, at 11 cm water pressure with an average user time of 6 hours and 51 minutes and 100% compliance for days of use and over 4 hours of daily use. Phillip [name] would like an auto set function that can be used as he loses weight.  Sleep medical history and family sleep history: Brother on CPAP, his "sister should be".  Social history: married, no tobacco use ever, no ETOH , caffeine user - plain iced tea , 3-4 glasses a day.  Interval history from 08/11/2016, as the pleasure of meeting with Phillip [name] today for a yearly compliance visit the patient has again reached 97% compliance with an average user time of 6 hours and 57 minutes the pressure set at 11 cm water with 2 cm EPR the residual AHI is 3.0 there are some air leaks but no major ones, there is no evidence of treatment emergent central apneas. He likes his machine and he feels that he is benefiting his sleep quality.  He has a new grand child.     Review of Systems: Out of a complete 14 system review, the patient complains of only the following symptoms, and all other reviewed systems are negative.  The fatigue severity score was today endorsed at 19 points and the Epworth sleepiness score at 4 points.  0 points were endorsed on the 15 point geriatric depression score.  Total knee replacement 4 years ago. Arthroscopic left knee 14 years ago,    Social History   Social History  . Marital status: Married    Spouse name: Phillip [name]  . Number of children: 4  . Years of education: 75   Occupational History  . MD Phillip [name]   Social History Main Topics  . Smoking status: Never Smoker  . Smokeless tobacco: Never Used  . Alcohol use No  . Drug use: No  . Sexual activity: Not on file   Other Topics Concern  . Not on file   Social History Narrative   Patient lives at home with family.   Caffeine Use: occasionally tea    Family  History  Problem Relation Age of Onset  . Cancer Mother     Breast  . Stroke Mother   . Stroke Maternal Grandmother   . Stroke Maternal Aunt   . Stroke Maternal Uncle     Past Medical History:  Diagnosis Date  . Anemia   . Arthritis   . Diabetes mellitus   . Gout   . Hyperlipidemia   . Hypertension   . Localized osteoarthritis of left knee 11/20/2012  . Migraine headache with aura   . Neuromuscular disorder ([place])    " mild periferal neauropathy  . Osteoarthritis of left hip 11/20/2012  . PVC (premature ventricular contraction)   . Sleep apnea    cpap    Past Surgical History:  Procedure Laterality Date  . CHOLECYSTECTOMY  1997  . CVA     CVA, occiptal  . CVA     CVA, occipital  . knee arthoscopy  2006   Left Knee  . left total hip    . left unicarpmental knee    . LOOP RECORDER IMPLANT N/A 04/19/2013   Procedure: LOOP RECORDER IMPLANT;  Surgeon: Phillip Lance, MD;  Location: [facility] CATH LAB;  Service: Cardiovascular;  Laterality: N/A;  . PARTIAL KNEE ARTHROPLASTY Left 11/20/2012   Procedure: UNICOMPARTMENTAL KNEE;  Surgeon: Phillip Bridge, MD;  Location: [place];  Service: Orthopedics;  Laterality: Left;  . REPLACEMENT UNICONDYLAR JOINT KNEE Left 11/20/2012   Dr Phillip [name]  . TEE WITHOUT CARDIOVERSION N/A 04/19/2013   Procedure: TRANSESOPHAGEAL ECHOCARDIOGRAM (TEE);  Surgeon: Phillip Dresser, MD;  Location: [place];  Service: Cardiovascular;  Laterality: N/A;  . TONSILLECTOMY AND ADENOIDECTOMY     Childhood  . TOTAL HIP ARTHROPLASTY Left 11/20/2012   Dr Phillip [name]  . TOTAL HIP ARTHROPLASTY Left 11/20/2012   Procedure: TOTAL HIP ARTHROPLASTY;  Surgeon: Phillip Bridge, MD;  Location: [place];  Service: Orthopedics;  Laterality: Left;    Current Outpatient Prescriptions  Medication Sig Dispense Refill  . acetaminophen (TYLENOL) 650 MG CR tablet Take 1,300 mg by mouth every 8 (eight) hours as needed for pain.    [name] allopurinol (ZYLOPRIM) 300 MG tablet Take 1 tablet (300 mg  total) by mouth daily. 90 tablet 3  . blood glucose meter kit and supplies KIT Use up to four times daily as directed. Dx E13.40. 1 each 0  . canagliflozin (INVOKANA) 300 MG TABS tablet Take 1 tablet (300 mg total) by mouth daily. 90 tablet 3  . clopidogrel (PLAVIX) 75 MG tablet Take 1 tablet (75 mg total) by mouth daily with breakfast. 90 tablet 1  . Exenatide ER 2 MG PEN Inject 2 mg into the skin once a week. 13 each 3  .  glucose blood (TRUE METRIX BLOOD GLUCOSE TEST) test strip Use to check blood sugar up to 4 times daily. Dx : E13.40 400 each 3  . hydrochlorothiazide (HYDRODIURIL) 12.5 MG tablet Take 1 tablet (12.5 mg total) by mouth daily. Use as needed for hypertension 90 tablet 3  . losartan (COZAAR) 100 MG tablet Take 1 tablet (100 mg total) by mouth daily. 90 tablet 1  . metaxalone (SKELAXIN) 800 MG tablet Take 1 tablet (800 mg total) by mouth every 6 (six) hours as needed. 60 tablet 1  . metFORMIN (GLUMETZA) 1000 MG (MOD) 24 hr tablet Take 1 tablet (1,000 mg total) by mouth 2 (two) times daily with a meal. 180 tablet 3  . nebivolol (BYSTOLIC) 10 MG tablet Take 10 mg by mouth daily.    . rosuvastatin (CRESTOR) 5 MG tablet Take 1 tab daily 90 tablet 3  . SM LANCETS 33G MISC USE TO TEST BLOOD SUGAR TWICE DAILY 400 each PRN  . zolpidem (AMBIEN) 5 MG tablet TAKE 1 TABLET BY MOUTH EACH NIGHT AT BEDTIME AS NEEDED FOR SLEEP 30 tablet 1   No current facility-administered medications for this visit.     Allergies as of 08/11/2016 - Review Complete 08/11/2016  Allergen Reaction Noted  . Sulfa antibiotics Other (See Comments) 06/01/2011    Vitals: BP 128/62   Pulse 78   Resp 18   Ht 5' 11.25" (1.81 m)   Wt 230 lb (104.3 kg)   BMI 31.85 kg/m  Last Weight:  Wt Readings from Last 1 Encounters:  08/11/16 230 lb (104.3 kg)   RGT:LMQE mass index is 31.85 kg/m.     Last Height:   Ht Readings from Last 1 Encounters:  08/11/16 5' 11.25" (1.81 m)    Physical exam:  General: The patient  is awake, alert and appears not in acute distress. The patient is well groomed. Head: Normocephalic, atraumatic. Neck is supple. Mallampati 1, the airway is short and ends abruptly is not narrow. The palate is of normal shape the uvula is midline the tongue is midline without fasciculation or tremor. neck circumference: 18 Nasal airflow unrestircted , TMJ click not  evident . Retrognathia is seen.  Cardiovascular:  Regular rate and rhythm , without  murmurs or carotid bruit, and without distended neck veins. Respiratory: Lungs are clear to auscultation. Skin:  Without evidence of edema, or rash Trunk: BMI is elevated but less than last year ! BMI 32.  . The patient's posture is erect    Neurologic exam : The patient is awake and alert, oriented to place and time.   Memory subjective  described as intact.  Attention span & concentration ability appears normal.  Speech is fluent,  without dysarthria, dysphonia or aphasia.  Mood and affect are appropriate.  Cranial nerves: Pupils are equal and briskly reactive to light.  Visual fields by finger perimetry are restricted just to upper right quadrant, not the full quadrant. Pie in the sky.  Facial motor strength is symmetric and tongue and uvula move midline. Shoulder shrug was symmetrical.  Deep tendon reflexes: in the  upper and lower extremities are symmetric and intact.  The patient was advised of the nature of the diagnosed sleep disorder , the treatment options and risks for general a health and wellness arising from not treating the condition.  I spent more than 15 minutes of face to face time with the patient. Greater than 50% of time was spent in counseling and coordination of care. We have discussed  the diagnosis and differential and I answered the patient's questions.    On 08-14-12 the patient was evaluated for a stroke at [facility]. Has followed Dr Leonie Man. Had an MRI of the brain that day, after review of the MRI in the  emergency department he was transferred to: To the stroke service and an occipital stroke was identified.  Assessment:  After physical and neurologic examination, review of laboratory studies,  Personal review of imaging studies, reports of other /same  Imaging studies ,  Results of polysomnography/ neurophysiology testing and pre-existing records as far as provided in visit., my assessment is   1) Phillip [name] has done excellently with his current CPAP treatment , again achieving 100% compliance. 2) Obesity, BMI: further  reduction will  benefit hypertension, gout and diabetes.     Plan:  Rv in 26 month with MD    [facility], MD  08/11/2016   CC: Phillip [name], [place] , [place] [id number]   CC; Dr Antony Contras, primary neurologist.

## 2016-08-21 DIAGNOSIS — E785 Hyperlipidemia, unspecified: Secondary | ICD-10-CM | POA: Diagnosis not present

## 2016-08-21 DIAGNOSIS — E668 Other obesity: Secondary | ICD-10-CM | POA: Diagnosis not present

## 2016-08-21 DIAGNOSIS — E119 Type 2 diabetes mellitus without complications: Secondary | ICD-10-CM | POA: Diagnosis not present

## 2016-08-21 DIAGNOSIS — I472 Ventricular tachycardia: Secondary | ICD-10-CM | POA: Diagnosis not present

## 2016-08-21 DIAGNOSIS — I10 Essential (primary) hypertension: Secondary | ICD-10-CM | POA: Diagnosis not present

## 2016-08-21 DIAGNOSIS — Z4509 Encounter for adjustment and management of other cardiac device: Secondary | ICD-10-CM | POA: Diagnosis not present

## 2016-08-21 DIAGNOSIS — I2510 Atherosclerotic heart disease of native coronary artery without angina pectoris: Secondary | ICD-10-CM | POA: Diagnosis not present

## 2016-08-21 NOTE — Progress Notes (Signed)
[place] at [facility] [address], [place], [place] [address] [phone]  Date:  08/22/2016   Name:  Phillip [name]   DOB:  [birth year]   MRN:  [id number]  PCP:  Lamar Blinks, MD    Chief Complaint: Follow-up (Pt here for 4 month follow up. )   History of Present Illness:  Phillip [name] is a 66 y.o. 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 He is also on the [facility] crisis response team  He notes that his glucose was high for a few weeks about a month ago. He did not make any changes to his diet or medications so is not sure why this occurred, but suspects that his A1c will be a little higher today  Generally fasting glucose will be 140 -160, but down around 114 in the evening before dinner He checks his glucose when he thinks about it- maybe 6x a week total  He would like some clobetasol as needed for an itchy rash He will use tylenol as needed for his chronic back pain- on occasion will use NSAID Had a mild compression fracture of his lumbar years ago while sledding and has some arthritis at this level He will have to urinate once during the night- has noted some dribbling and weak urine stream.  Would like to try flomax. There is a questionable history of sulfa allergy- "my mother said that either me or my brother had the allergy!" but he is able to tolerate sulfa analogues like HCTZ ok  He has not needed the HCTZ as his BP has been fine- he will use this as needed  BP Readings from Last 3 Encounters:  08/22/16 138/82  08/11/16 128/62  04/20/16 140/86   Lab Results  Component Value Date   HGBA1C 7.1 (H) 04/20/2016     Patient Active Problem List   Diagnosis Date Noted  . OSA on CPAP 08/12/2015  . Back  pain 02/18/2015  . Ventricular tachycardia ([place]) 11/10/2014  . CAD (coronary artery disease), native coronary artery   . Variants of migraine, not elsewhere classified, without mention of intractable migraine without mention of status migrainosus 08/14/2013  . History of ischemic stroke without residual deficits 04/17/2013  . ED (erectile dysfunction) 02/01/2012  . Gout 09/05/2011  . Hyperlipidemia 09/05/2011  . Hypertriglyceridemia 09/05/2011  . Arthritis 09/05/2011  . PVC (premature ventricular contraction) 09/05/2011  . Type II or unspecified type diabetes mellitus without mention of complication, not stated as uncontrolled 06/01/2011  . Essential hypertension, benign 06/01/2011  . OSA (obstructive sleep apnea) 06/01/2011    Past Medical History:  Diagnosis Date  . Anemia   . Arthritis   . Diabetes mellitus   . Gout   . Hyperlipidemia   . Hypertension   . Localized osteoarthritis of left knee 11/20/2012  . Migraine headache with aura   . Neuromuscular disorder ([place])    " mild periferal neauropathy  . Osteoarthritis of left hip 11/20/2012  . PVC (premature ventricular contraction)   . Sleep apnea    cpap    Past Surgical History:  Procedure Laterality Date  . CHOLECYSTECTOMY  1997  . CVA     CVA, occiptal  . CVA  CVA, occipital  . knee arthoscopy  2006   Left Knee  . left total hip    . left unicarpmental knee    . LOOP RECORDER IMPLANT N/A 04/19/2013   Procedure: LOOP RECORDER IMPLANT;  Surgeon: Evans Lance, MD;  Location: [facility] CATH LAB;  Service: Cardiovascular;  Laterality: N/A;  . PARTIAL KNEE ARTHROPLASTY Left 11/20/2012   Procedure: UNICOMPARTMENTAL KNEE;  Surgeon: Johnny Bridge, MD;  Location: [place];  Service: Orthopedics;  Laterality: Left;  . REPLACEMENT UNICONDYLAR JOINT KNEE Left 11/20/2012   Dr Mardelle Matte  . TEE WITHOUT CARDIOVERSION N/A 04/19/2013   Procedure: TRANSESOPHAGEAL ECHOCARDIOGRAM (TEE);  Surgeon: Larey Dresser, MD;  Location: [place];  Service: Cardiovascular;  Laterality: N/A;  . TONSILLECTOMY AND ADENOIDECTOMY     Childhood  . TOTAL HIP ARTHROPLASTY Left 11/20/2012   Dr Mardelle Matte  . TOTAL HIP ARTHROPLASTY Left 11/20/2012   Procedure: TOTAL HIP ARTHROPLASTY;  Surgeon: Johnny Bridge, MD;  Location: [place];  Service: Orthopedics;  Laterality: Left;    Social History  Substance Use Topics  . Smoking status: Never Smoker  . Smokeless tobacco: Never Used  . Alcohol use No    Family History  Problem Relation Age of [place]  . Cancer Mother     Breast  . Stroke Mother   . Stroke Maternal Grandmother   . Stroke Maternal Aunt   . Stroke Maternal Uncle     Allergies  Allergen Reactions  . Sulfa Antibiotics Other (See Comments)    Unknown reaction    Medication list has been reviewed and updated.  Current Outpatient Prescriptions on File Prior to Visit  Medication Sig Dispense Refill  . acetaminophen (TYLENOL) 650 MG CR tablet Take 1,300 mg by mouth every 8 (eight) hours as needed for pain.    [name] allopurinol (ZYLOPRIM) 300 MG tablet Take 1 tablet (300 mg total) by mouth daily. 90 tablet 3  . blood glucose meter kit and supplies KIT Use up to four times daily as directed. Dx E13.40. 1 each 0  . canagliflozin (INVOKANA) 300 MG TABS tablet Take 1 tablet (300 mg total) by mouth daily. 90 tablet 3  . clopidogrel (PLAVIX) 75 MG tablet Take 1 tablet (75 mg total) by mouth daily with breakfast. 90 tablet 1  . Exenatide ER 2 MG PEN Inject 2 mg into the skin once a week. 13 each 3  . glucose blood (TRUE METRIX BLOOD GLUCOSE TEST) test strip Use to check blood sugar up to 4 times daily. Dx : E13.40 400 each 3  . hydrochlorothiazide (HYDRODIURIL) 12.5 MG tablet Take 1 tablet (12.5 mg total) by mouth daily. Use as needed for hypertension 90 tablet 3  . losartan (COZAAR) 100 MG tablet Take 1 tablet (100 mg total) by mouth daily. 90 tablet 1  . metaxalone (SKELAXIN) 800 MG tablet Take 1 tablet (800 mg total) by mouth  every 6 (six) hours as needed. 60 tablet 1  . metFORMIN (GLUMETZA) 1000 MG (MOD) 24 hr tablet Take 1 tablet (1,000 mg total) by mouth 2 (two) times daily with a meal. 180 tablet 3  . nebivolol (BYSTOLIC) 10 MG tablet Take 10 mg by mouth daily.    . rosuvastatin (CRESTOR) 5 MG tablet Take 1 tab daily 90 tablet 3  . SM LANCETS 33G MISC USE TO TEST BLOOD SUGAR TWICE DAILY 400 each PRN   No current facility-administered medications on file prior to visit.     Review of Systems:  As  per HPI- otherwise negative.   Physical Examination: Vitals:   08/22/16 1031  BP: 138/82  Pulse: 73  Temp: 97.6 F (36.4 C)   Vitals:   08/22/16 1031  Weight: 230 lb (104.3 kg)  Height: 5' 11.25" (1.81 m)   Body mass index is 31.85 kg/m. Ideal Body Weight: Weight in (lb) to have BMI = 25: 180.1  GEN: WDWN, NAD, Non-toxic, A & O x 3, overweight, looks well HEENT: Atraumatic, Normocephalic. Neck supple. No masses, No LAD. Ears and Nose: No external deformity. CV: RRR, No M/G/R. No JVD. No thrill. No extra heart sounds. PULM: CTA B, no wheezes, crackles, rhonchi. No retractions. No resp. distress. No accessory muscle use. ABD: S, NT, ND EXTR: No c/c/e NEURO Normal gait.  PSYCH: Normally interactive. Conversant. Not depressed or anxious appearing.  Calm demeanor.  Normal foot exam today  Assessment and Plan: Controlled type 2 diabetes mellitus without complication, without long-term current use of insulin ([place]) - Plan: Hemoglobin A1c, Urine Microalbumin w/creat. ratio  Rash - Plan: clobetasol cream (TEMOVATE) 0.05 %  Benign prostatic hyperplasia with urinary frequency - Plan: tamsulosin (FLOMAX) 0.4 MG CAPS capsule  Insomnia, unspecified type - Plan: zolpidem (AMBIEN) 5 MG tablet  Here today to discuss a few concerns Refilled his [place] which he uses on occasion for insomnia Check A1c Foot exam Will try flomax for his BPH sx Continue byetta, invokana and metformin for DM  Signed Lamar Blinks, MD

## 2016-08-22 ENCOUNTER — Encounter: Payer: Self-pay | Admitting: Family Medicine

## 2016-08-22 ENCOUNTER — Ambulatory Visit (INDEPENDENT_AMBULATORY_CARE_PROVIDER_SITE_OTHER): Payer: 59 | Admitting: Family Medicine

## 2016-08-22 VITALS — BP 138/82 | HR 73 | Temp 97.6°F | Ht 71.25 in | Wt 230.0 lb

## 2016-08-22 DIAGNOSIS — G4700 Insomnia, unspecified: Secondary | ICD-10-CM | POA: Diagnosis not present

## 2016-08-22 DIAGNOSIS — N401 Enlarged prostate with lower urinary tract symptoms: Secondary | ICD-10-CM

## 2016-08-22 DIAGNOSIS — R350 Frequency of micturition: Secondary | ICD-10-CM | POA: Diagnosis not present

## 2016-08-22 DIAGNOSIS — E119 Type 2 diabetes mellitus without complications: Secondary | ICD-10-CM

## 2016-08-22 DIAGNOSIS — R21 Rash and other nonspecific skin eruption: Secondary | ICD-10-CM

## 2016-08-22 LAB — HEMOGLOBIN A1C: Hgb A1c MFr Bld: 7.5 % — ABNORMAL HIGH (ref 4.6–6.5)

## 2016-08-22 LAB — MICROALBUMIN / CREATININE URINE RATIO
CREATININE, U: 64.3 mg/dL
Microalb Creat Ratio: 1.1 mg/g (ref 0.0–30.0)

## 2016-08-22 MED ORDER — CLOBETASOL PROPIONATE 0.05 % EX CREA: 1.0000 "application " | TOPICAL_CREAM | Freq: Two times a day (BID) | CUTANEOUS | 2 refills | Status: DC

## 2016-08-22 MED ORDER — ZOLPIDEM TARTRATE 5 MG PO TABS: ORAL_TABLET | ORAL | 2 refills | Status: DC

## 2016-08-22 MED ORDER — TAMSULOSIN HCL 0.4 MG PO CAPS: 0.4000 mg | ORAL_CAPSULE | Freq: Every day | ORAL | 11 refills | Status: DC

## 2016-08-22 MED FILL — TAMSULOSIN HCL 0.4 MG CAP: 0.4 | 90 days supply | Qty: 90 | Fill #0

## 2016-08-22 MED FILL — CLOBETASOL 0.05% CREAM: 0.05 | 20 days supply | Qty: 60 | Fill #0

## 2016-08-22 NOTE — Patient Instructions (Signed)
Always great to see you- many congrats on your new grand-daughter! Take care and I'll be in touch with your labs If you need me to change the flomax to a 90 day let me know

## 2016-08-23 MED FILL — AMOXICILLIN 500 MG CAPSULE: 500 | 6 days supply | Qty: 28 | Fill #0

## 2016-08-23 MED FILL — CHLORHEXIDINE 0.12% RINSE: 0.12 | 16 days supply | Qty: 473 | Fill #0

## 2016-08-23 MED FILL — HYDROCODON-APAP 5-325: 5-325 | 1 days supply | Qty: 12 | Fill #0

## 2016-09-02 MED FILL — INVOKANA 300 MG TABLET: 300 | 90 days supply | Qty: 90 | Fill #1

## 2016-09-02 MED FILL — ZOLPIDEM TARTRATE 5 MG TAB: 5 | 30 days supply | Qty: 30 | Fill #0

## 2016-09-04 ENCOUNTER — Encounter: Payer: Self-pay | Admitting: Family Medicine

## 2016-09-05 ENCOUNTER — Encounter: Payer: Self-pay | Admitting: Family Medicine

## 2016-09-05 ENCOUNTER — Other Ambulatory Visit: Payer: Self-pay | Admitting: Emergency Medicine

## 2016-09-05 MED ORDER — EXENATIDE ER 2 MG ~~LOC~~ PEN: 2.0000 mg | PEN_INJECTOR | SUBCUTANEOUS | 3 refills | Status: DC

## 2016-09-05 MED FILL — BYDUREON 2 MG PEN INJECT: 2 | 84 days supply | Qty: 12 | Fill #0

## 2016-09-06 MED FILL — DOXYCYCLINE HYC 100 MG TAB: 100 | 90 days supply | Qty: 90 | Fill #2

## 2016-10-31 MED FILL — ALLOPURINOL 300 MG TAB: 300 | 90 days supply | Qty: 90 | Fill #1

## 2016-10-31 MED FILL — CLOPIDOGREL 75 MG TABLET: 75 | 90 days supply | Qty: 90 | Fill #1

## 2016-10-31 MED FILL — LOSARTAN POTASSIUM 100 MG T: 100 | 90 days supply | Qty: 90 | Fill #1

## 2016-10-31 MED FILL — BYSTOLIC 10 MG TABLET: 10 | 90 days supply | Qty: 90 | Fill #1

## 2016-11-03 MED FILL — METFORMIN HCL ER 500 MG TAB: 500 | 90 days supply | Qty: 360 | Fill #1

## 2016-11-04 DIAGNOSIS — G4733 Obstructive sleep apnea (adult) (pediatric): Secondary | ICD-10-CM | POA: Diagnosis not present

## 2016-11-25 ENCOUNTER — Encounter: Payer: Self-pay | Admitting: Family Medicine

## 2016-11-25 MED FILL — INVOKANA 300 MG TABLET: 300 | 90 days supply | Qty: 90 | Fill #2

## 2016-11-25 MED FILL — TAMSULOSIN HCL 0.4 MG CAP: 0.4 | 90 days supply | Qty: 90 | Fill #1

## 2016-12-07 ENCOUNTER — Ambulatory Visit: Payer: Self-pay | Admitting: Family Medicine

## 2016-12-14 ENCOUNTER — Ambulatory Visit: Payer: Self-pay | Admitting: Family Medicine

## 2017-01-14 NOTE — Progress Notes (Signed)
Phillip [name] at [facility] [address], [place], [place] [address] [phone]  Date:  01/16/2017   Name:  Phillip [name]   DOB:  12-23-1950   MRN:  [id number]  PCP:  Darreld Mclean, MD    Chief Complaint: Follow-up   History of Present Illness:  Phillip [name] is a 66 y.o. very pleasant male patient who presents with the following:  Here today for a periodic recheck Last visit with me in April of this year  Generally fasting glucose will be 140 -160, but down around 114 in the evening before dinner He checks his glucose when he thinks about it- maybe 6x a week total  He would like some clobetasol as needed for an itchy rash He will use tylenol as needed for his chronic back pain- on occasion will use NSAID Had a mild compression fracture of his lumbar years ago while sledding and has some arthritis at this level He will have to urinate once during the night- has noted some dribbling and weak urine stream.  Would like to try flomax. There is a questionable history of sulfa allergy- "my mother said that either me or my brother had the allergy!" but he is able to tolerate sulfa analogues like HCTZ ok  He has not needed the HCTZ as his BP has been fine- he will use this as needed  Lab Results  Component Value Date   HGBA1C 7.5 (H) 08/22/2016   Due for a flu shot and eye exam Eye exam to be done tomorrow at [place] [facility]? He is still thinking about this, he may do this later on Lab Results  Component Value Date   PSA 1.65 07/21/2015   PSA 2.09 07/25/2014   PSA 1.19 08/30/2011   He is working some hours at [employer] still but is more of less retired at this point He recently welcomed his 5th grandchild, a boy named Phillip [name]  He is using flomax for BPH- this is helping some, he does not want to try a different med at this time  He would like to change to his Bydureon to the Bydureon BCise 60m once a week for ease of  use  He did check his BP yesterday- 128/79 He is not taking HCTZ currently- he tends to use this prn for his BP, but has not needed it recently  He uses crestor every other day  He uses [place] rare occasion- needs a refill today as he will be traveling soon  Gave pt 2 samples of Bydureon BCise- lot [id number] Active Problem List   Diagnosis Date Noted  . OSA on CPAP 08/12/2015  . Back pain 02/18/2015  . Ventricular tachycardia ([place] 11/10/2014  . CAD (coronary artery disease), native coronary artery   . Variants of migraine, not elsewhere classified, without mention of intractable migraine without mention of status migrainosus 08/14/2013  . History of ischemic stroke without residual deficits 04/17/2013  . ED (erectile dysfunction) 02/01/2012  . Gout 09/05/2011  . Hyperlipidemia 09/05/2011  . Hypertriglyceridemia 09/05/2011  . Arthritis 09/05/2011  . PVC (premature ventricular contraction) 09/05/2011  . Type II or unspecified type diabetes mellitus without mention of complication, not stated as uncontrolled 06/01/2011  . Essential hypertension, benign 06/01/2011    Past Medical History:  Diagnosis Date  . Anemia   . Arthritis   . Diabetes mellitus   . Gout   . Hyperlipidemia   .  Hypertension   . Localized osteoarthritis of left knee 11/20/2012  . Migraine headache with aura   . Neuromuscular disorder ([place])    " mild periferal neauropathy  . Osteoarthritis of left hip 11/20/2012  . PVC (premature ventricular contraction)   . Sleep apnea    cpap    Past Surgical History:  Procedure Laterality Date  . CHOLECYSTECTOMY  1997  . CVA     CVA, occiptal  . CVA     CVA, occipital  . knee arthoscopy  2006   Left Knee  . left total hip    . left unicarpmental knee    . LOOP RECORDER IMPLANT N/A 04/19/2013   Procedure: LOOP RECORDER IMPLANT;  Surgeon: Evans Lance, MD;  Location: [facility] CATH LAB;  Service: Cardiovascular;  Laterality: N/A;  . PARTIAL KNEE ARTHROPLASTY  Left 11/20/2012   Procedure: UNICOMPARTMENTAL KNEE;  Surgeon: Johnny Bridge, MD;  Location: [place];  Service: Orthopedics;  Laterality: Left;  . REPLACEMENT UNICONDYLAR JOINT KNEE Left 11/20/2012   Dr Mardelle Matte  . TEE WITHOUT CARDIOVERSION N/A 04/19/2013   Procedure: TRANSESOPHAGEAL ECHOCARDIOGRAM (TEE);  Surgeon: Larey Dresser, MD;  Location: [place];  Service: Cardiovascular;  Laterality: N/A;  . TONSILLECTOMY AND ADENOIDECTOMY     Childhood  . TOTAL HIP ARTHROPLASTY Left 11/20/2012   Dr Mardelle Matte  . TOTAL HIP ARTHROPLASTY Left 11/20/2012   Procedure: TOTAL HIP ARTHROPLASTY;  Surgeon: Johnny Bridge, MD;  Location: [place];  Service: Orthopedics;  Laterality: Left;    Social History  Substance Use Topics  . Smoking status: Never Smoker  . Smokeless tobacco: Never Used  . Alcohol use No    Family History  Problem Relation Age of Onset  . Cancer Mother        Breast  . Stroke Mother   . Stroke Maternal Grandmother   . Stroke Maternal Aunt   . Stroke Maternal Uncle     Allergies  Allergen Reactions  . Sulfa Antibiotics Other (See Comments)    Unknown reaction    Medication list has been reviewed and updated.  Current Outpatient Prescriptions on File Prior to Visit  Medication Sig Dispense Refill  . acetaminophen (TYLENOL) 650 MG CR tablet Take 1,300 mg by mouth every 8 (eight) hours as needed for pain.    Phillip [name] allopurinol (ZYLOPRIM) 300 MG tablet Take 1 tablet (300 mg total) by mouth daily. 90 tablet 3  . blood glucose meter kit and supplies KIT Use up to four times daily as directed. Dx E13.40. 1 each 0  . canagliflozin (INVOKANA) 300 MG TABS tablet Take 1 tablet (300 mg total) by mouth daily. 90 tablet 3  . clobetasol cream (TEMOVATE) 9.37 % Apply 1 application topically 2 (two) times daily. 60 g 2  . clopidogrel (PLAVIX) 75 MG tablet Take 1 tablet (75 mg total) by mouth daily with breakfast. 90 tablet 1  . Exenatide ER 2 MG PEN Inject 2 mg into the skin once a week. 13  each 3  . glucose blood (TRUE METRIX BLOOD GLUCOSE TEST) test strip Use to check blood sugar up to 4 times daily. Dx : E13.40 400 each 3  . hydrochlorothiazide (HYDRODIURIL) 12.5 MG tablet Take 1 tablet (12.5 mg total) by mouth daily. Use as needed for hypertension 90 tablet 3  . losartan (COZAAR) 100 MG tablet Take 1 tablet (100 mg total) by mouth daily. 90 tablet 1  . metaxalone (SKELAXIN) 800 MG tablet Take 1 tablet (800 mg total) by  mouth every 6 (six) hours as needed. 60 tablet 1  . metFORMIN (GLUMETZA) 1000 MG (MOD) 24 hr tablet Take 1 tablet (1,000 mg total) by mouth 2 (two) times daily with a meal. 180 tablet 3  . nebivolol (BYSTOLIC) 10 MG tablet Take 10 mg by mouth daily.    . rosuvastatin (CRESTOR) 5 MG tablet Take 1 tab daily 90 tablet 3  . SM LANCETS 33G MISC USE TO TEST BLOOD SUGAR TWICE DAILY 400 each PRN  . tamsulosin (FLOMAX) 0.4 MG CAPS capsule Take 1 capsule (0.4 mg total) by mouth daily. 30 capsule 11  . zolpidem (AMBIEN) 5 MG tablet TAKE 1 TABLET BY MOUTH EACH NIGHT AT BEDTIME AS NEEDED FOR SLEEP 30 tablet 2   No current facility-administered medications on file prior to visit.     Review of Systems:  As per HPI- otherwise negative. He tries to get about 10k steps a day, no CP or SOB with exercise    Physical Examination: Vitals:   01/16/17 0901 01/16/17 0904  BP: (!) 157/90 (!) 146/90  Pulse: 65   Temp: 97.6 F (36.4 C)   SpO2: 97%    Vitals:   01/16/17 0901  Weight: 226 lb (102.5 kg)  Height: 5' 11.25" (1.81 m)   Body mass index is 31.3 kg/m. Ideal Body Weight: Weight in (lb) to have BMI = 25: 180.1  GEN: WDWN, NAD, Non-toxic, A & O x 3 HEENT: Atraumatic, Normocephalic. Neck supple. No masses, No LAD.  Bilateral TM wnl, oropharynx normal.  PEERL,EOMI.   Ears and Nose: No external deformity. CV: RRR, No M/G/R. No JVD. No thrill. No extra heart sounds. PULM: CTA B, no wheezes, crackles, rhonchi. No retractions. No resp. distress. No accessory muscle  use. EXTR: No c/c/e NEURO Normal gait.  PSYCH: Normally interactive. Conversant. Not depressed or anxious appearing.  Calm demeanor.  Overweight, otherwise looks well  Assessment and Plan: Controlled type 2 diabetes mellitus without complication, without long-term current use of insulin ([place]) - Plan: Hemoglobin A1c, Comprehensive metabolic panel, metformin (FORTAMET) 1000 MG (OSM) 24 hr tablet, canagliflozin (INVOKANA) 300 MG TABS tablet, Exenatide ER (BYDUREON BCISE) 2 MG/0.85ML AUIJ  Benign prostatic hyperplasia with urinary frequency - Plan: PSA  Essential hypertension  Mixed hyperlipidemia  Prostate cancer screening - Plan: PSA  Screening for deficiency anemia - Plan: CBC  Travel advice encounter - Plan: typhoid (VIVOTIF) DR capsule  Insomnia, unspecified type - Plan: zolpidem (AMBIEN) 5 MG tablet  Need for influenza vaccination - Plan: Flu vaccine HIGH DOSE PF  Here today for a recheck visit Refilled DM meds, check a1c today He will have his eye exam tomorrow vivotif rx for upcoming trip overseas Flu shot today Changed to [employer]  Signed [place] Blinks, MD  Received labs  Results for orders placed or performed in visit on 01/16/17  CBC  Result Value Ref Range   WBC 4.5 4.0 - 10.5 K/uL   RBC 4.37 4.22 - 5.81 Mil/uL   Platelets 294.0 150.0 - 400.0 K/uL   Hemoglobin 13.7 13.0 - 17.0 g/dL   HCT 41.5 39.0 - 52.0 %   MCV 95.1 78.0 - 100.0 fl   MCHC 32.9 30.0 - 36.0 g/dL   RDW 13.1 11.5 - 15.5 %  Hemoglobin A1c  Result Value Ref Range   Hgb A1c MFr Bld 7.3 (H) 4.6 - 6.5 %  Comprehensive metabolic panel  Result Value Ref Range   Sodium 138 135 - 145 mEq/L   Potassium 4.7 3.5 -  5.1 mEq/L   Chloride 103 96 - 112 mEq/L   CO2 26 19 - 32 mEq/L   Glucose, Bld 151 (H) 70 - 99 mg/dL   BUN 21 6 - 23 mg/dL   Creatinine, Ser 0.98 0.40 - 1.50 mg/dL   Total Bilirubin 0.5 0.2 - 1.2 mg/dL   Alkaline Phosphatase 60 39 - 117 U/L   AST 14 0 - 37 U/L   ALT 18 0 - 53  U/L   Total Protein 6.7 6.0 - 8.3 g/dL   Albumin 4.3 3.5 - 5.2 g/dL   Calcium 10.1 8.4 - 10.5 mg/dL   GFR 80.84 >60.00 mL/min  PSA  Result Value Ref Range   PSA 1.80 0.10 - 4.00 ng/mL   Message to pt

## 2017-01-16 ENCOUNTER — Encounter: Payer: Self-pay | Admitting: Family Medicine

## 2017-01-16 ENCOUNTER — Telehealth: Payer: Self-pay | Admitting: Family Medicine

## 2017-01-16 ENCOUNTER — Ambulatory Visit (INDEPENDENT_AMBULATORY_CARE_PROVIDER_SITE_OTHER): Payer: 59 | Admitting: Family Medicine

## 2017-01-16 VITALS — BP 146/90 | HR 65 | Temp 97.6°F | Ht 71.25 in | Wt 226.0 lb

## 2017-01-16 DIAGNOSIS — R350 Frequency of micturition: Secondary | ICD-10-CM | POA: Diagnosis not present

## 2017-01-16 DIAGNOSIS — I10 Essential (primary) hypertension: Secondary | ICD-10-CM

## 2017-01-16 DIAGNOSIS — Z7189 Other specified counseling: Secondary | ICD-10-CM

## 2017-01-16 DIAGNOSIS — Z23 Encounter for immunization: Secondary | ICD-10-CM | POA: Diagnosis not present

## 2017-01-16 DIAGNOSIS — Z7184 Encounter for health counseling related to travel: Secondary | ICD-10-CM

## 2017-01-16 DIAGNOSIS — Z125 Encounter for screening for malignant neoplasm of prostate: Secondary | ICD-10-CM | POA: Diagnosis not present

## 2017-01-16 DIAGNOSIS — Z130 Encounter for screening for diseases of the blood and blood-forming organs and certain disorders involving the immune mechanism: Secondary | ICD-10-CM

## 2017-01-16 DIAGNOSIS — E119 Type 2 diabetes mellitus without complications: Secondary | ICD-10-CM | POA: Diagnosis not present

## 2017-01-16 DIAGNOSIS — N401 Enlarged prostate with lower urinary tract symptoms: Secondary | ICD-10-CM | POA: Diagnosis not present

## 2017-01-16 DIAGNOSIS — G4700 Insomnia, unspecified: Secondary | ICD-10-CM | POA: Diagnosis not present

## 2017-01-16 DIAGNOSIS — E782 Mixed hyperlipidemia: Secondary | ICD-10-CM | POA: Diagnosis not present

## 2017-01-16 LAB — CBC
HCT: 41.5 % (ref 39.0–52.0)
HEMOGLOBIN: 13.7 g/dL (ref 13.0–17.0)
MCHC: 32.9 g/dL (ref 30.0–36.0)
MCV: 95.1 fl (ref 78.0–100.0)
Platelets: 294 10*3/uL (ref 150.0–400.0)
RBC: 4.37 Mil/uL (ref 4.22–5.81)
RDW: 13.1 % (ref 11.5–15.5)
WBC: 4.5 10*3/uL (ref 4.0–10.5)

## 2017-01-16 LAB — HEMOGLOBIN A1C: Hgb A1c MFr Bld: 7.3 % — ABNORMAL HIGH (ref 4.6–6.5)

## 2017-01-16 LAB — COMPREHENSIVE METABOLIC PANEL
ALT: 18 U/L (ref 0–53)
AST: 14 U/L (ref 0–37)
Albumin: 4.3 g/dL (ref 3.5–5.2)
Alkaline Phosphatase: 60 U/L (ref 39–117)
BUN: 21 mg/dL (ref 6–23)
CALCIUM: 10.1 mg/dL (ref 8.4–10.5)
CHLORIDE: 103 meq/L (ref 96–112)
CO2: 26 meq/L (ref 19–32)
CREATININE: 0.98 mg/dL (ref 0.40–1.50)
GFR: 80.84 mL/min (ref >60.00)
Glucose, Bld: 151 mg/dL — ABNORMAL HIGH (ref 70–99)
POTASSIUM: 4.7 meq/L (ref 3.5–5.1)
Sodium: 138 mEq/L (ref 135–145)
Total Bilirubin: 0.5 mg/dL (ref 0.2–1.2)
Total Protein: 6.7 g/dL (ref 6.0–8.3)

## 2017-01-16 LAB — PSA: PSA: 1.8 ng/mL (ref 0.10–4.00)

## 2017-01-16 MED ORDER — EXENATIDE ER 2 MG/0.85ML ~~LOC~~ AUIJ: 2.0000 mg | AUTO-INJECTOR | SUBCUTANEOUS | 3 refills | Status: DC

## 2017-01-16 MED ORDER — TYPHOID VACCINE PO CPDR: 1.0000 | DELAYED_RELEASE_CAPSULE | ORAL | 0 refills | Status: DC

## 2017-01-16 MED ORDER — ZOLPIDEM TARTRATE 5 MG PO TABS: ORAL_TABLET | ORAL | 2 refills | Status: DC

## 2017-01-16 MED ORDER — CANAGLIFLOZIN 300 MG PO TABS: 300.0000 mg | ORAL_TABLET | Freq: Every day | ORAL | 3 refills | Status: DC

## 2017-01-16 MED ORDER — METFORMIN HCL ER (OSM) 1000 MG PO TB24: 1000.0000 mg | ORAL_TABLET | Freq: Two times a day (BID) | ORAL | 3 refills | Status: DC

## 2017-01-16 MED FILL — BYDUREON BCise 2 MG/0.85ML: 2 | 28 days supply | Qty: 3 | Fill #0

## 2017-01-16 MED FILL — VIVOTIF EC CAPSULE: 8 days supply | Qty: 4 | Fill #0

## 2017-01-16 NOTE — Patient Instructions (Signed)
Always great to see you!  I'll be in touch with your labs asap congrats on grandchild #5!!

## 2017-01-16 NOTE — Telephone Encounter (Signed)
Caller name:[facility] Relationship to patient: Can be [phone] Pharmacy:  Reason for call:wanting to switch metformin to  glucophage xr, due to insurance cost. Please advise

## 2017-01-17 ENCOUNTER — Encounter: Payer: Self-pay | Admitting: Family Medicine

## 2017-01-17 DIAGNOSIS — E119 Type 2 diabetes mellitus without complications: Secondary | ICD-10-CM | POA: Diagnosis not present

## 2017-01-17 LAB — HM DIABETES EYE EXAM

## 2017-01-18 MED FILL — METFORMIN HCL ER 500 MG TAB: 500 | 90 days supply | Qty: 360 | Fill #0

## 2017-01-18 NOTE — Telephone Encounter (Signed)
Spoke w/ [facility] are metformin but have different delivery systems. Informed [place] out of office today and I wasn't sure about okaying verbal to switch to Glucophage-XR. Monica verbalized understanding.

## 2017-01-18 NOTE — Telephone Encounter (Signed)
We cannot find a 1000 XR that is covered. He will continue to use 2 of the 500 mg instead

## 2017-01-18 NOTE — Telephone Encounter (Signed)
Script is $583.00 for [employer]. Can [facility] change to Glucophage XR. Call back [facility] pharm [phone]. They called 01/16/17 to request change so will route to DOD asst as well as Cop[facility]'s asst.

## 2017-01-26 MED FILL — LOSARTAN POTASSIUM 100 MG T: 100 | 90 days supply | Qty: 90 | Fill #2

## 2017-01-26 MED FILL — ZOLPIDEM TARTRATE 5 MG TAB: 5 | 30 days supply | Qty: 30 | Fill #0

## 2017-01-26 MED FILL — CLOPIDOGREL 75 MG TABLET: 75 | 90 days supply | Qty: 90 | Fill #2

## 2017-01-26 MED FILL — ALLOPURINOL 300 MG TAB: 300 | 90 days supply | Qty: 90 | Fill #2

## 2017-01-28 MED FILL — BYSTOLIC 10 MG TABLET: 10 | 90 days supply | Qty: 90 | Fill #2

## 2017-02-09 DIAGNOSIS — I472 Ventricular tachycardia: Secondary | ICD-10-CM | POA: Diagnosis not present

## 2017-02-09 DIAGNOSIS — I10 Essential (primary) hypertension: Secondary | ICD-10-CM | POA: Diagnosis not present

## 2017-02-09 DIAGNOSIS — E7849 Other hyperlipidemia: Secondary | ICD-10-CM | POA: Diagnosis not present

## 2017-02-09 DIAGNOSIS — E119 Type 2 diabetes mellitus without complications: Secondary | ICD-10-CM | POA: Diagnosis not present

## 2017-02-09 DIAGNOSIS — I2510 Atherosclerotic heart disease of native coronary artery without angina pectoris: Secondary | ICD-10-CM | POA: Diagnosis not present

## 2017-02-09 DIAGNOSIS — E668 Other obesity: Secondary | ICD-10-CM | POA: Diagnosis not present

## 2017-02-12 ENCOUNTER — Encounter: Payer: Self-pay | Admitting: Family Medicine

## 2017-02-13 MED FILL — BYDUREON BCise 2 MG/0.85ML: 2 | 56 days supply | Qty: 7 | Fill #1

## 2017-02-13 MED FILL — TAMSULOSIN HCL 0.4 MG CAP: 0.4 | 90 days supply | Qty: 90 | Fill #2

## 2017-02-13 MED FILL — INVOKANA 300 MG TABLET: 300 | 90 days supply | Qty: 90 | Fill #3

## 2017-02-15 NOTE — Telephone Encounter (Signed)
Bydureon samples picked up by Pt.

## 2017-02-21 ENCOUNTER — Encounter: Payer: Self-pay | Admitting: Family Medicine

## 2017-02-22 ENCOUNTER — Encounter: Payer: Self-pay | Admitting: Family Medicine

## 2017-02-23 ENCOUNTER — Telehealth: Payer: Self-pay

## 2017-02-23 NOTE — Telephone Encounter (Signed)
PA initiated via Covermymeds; KEY: APBRPR. Awaiting determination.

## 2017-02-28 NOTE — Telephone Encounter (Signed)
PA approved. Authorization effective for a maximum of 12 fills from 02/27/2017 to 02/26/2018.

## 2017-04-04 MED FILL — BYDUREON BCise 2 MG/0.85ML: 2 | 28 days supply | Qty: 3 | Fill #2

## 2017-04-11 MED FILL — METFORMIN HCL ER 500 MG TAB: 500 | 90 days supply | Qty: 360 | Fill #1

## 2017-04-12 DIAGNOSIS — G4733 Obstructive sleep apnea (adult) (pediatric): Secondary | ICD-10-CM | POA: Diagnosis not present

## 2017-04-20 MED FILL — LOSARTAN POTASSIUM 100 MG T: 100 | 90 days supply | Qty: 90 | Fill #3

## 2017-04-20 MED FILL — CLOPIDOGREL 75 MG TABLET: 75 | 90 days supply | Qty: 90 | Fill #3

## 2017-04-20 MED FILL — ALLOPURINOL 300 MG TAB: 300 | 90 days supply | Qty: 90 | Fill #3

## 2017-04-21 MED FILL — BYSTOLIC 10 MG TABLET: 10 | 90 days supply | Qty: 90 | Fill #3

## 2017-04-26 MED FILL — INVOKANA 300 MG TABLET: 300 | 90 days supply | Qty: 90 | Fill #0

## 2017-05-04 DIAGNOSIS — G4733 Obstructive sleep apnea (adult) (pediatric): Secondary | ICD-10-CM | POA: Diagnosis not present

## 2017-05-08 ENCOUNTER — Other Ambulatory Visit: Payer: Self-pay | Admitting: Family Medicine

## 2017-05-08 DIAGNOSIS — E119 Type 2 diabetes mellitus without complications: Secondary | ICD-10-CM

## 2017-05-09 ENCOUNTER — Other Ambulatory Visit: Payer: Self-pay | Admitting: Family Medicine

## 2017-05-09 DIAGNOSIS — E119 Type 2 diabetes mellitus without complications: Secondary | ICD-10-CM

## 2017-05-10 MED FILL — BYDUREON 2 MG PEN INJECT: 2 | 28 days supply | Qty: 4 | Fill #1

## 2017-05-11 MED FILL — TAMSULOSIN HCL 0.4 MG CAP: 0.4 | 90 days supply | Qty: 90 | Fill #3

## 2017-05-12 ENCOUNTER — Other Ambulatory Visit: Payer: Self-pay | Admitting: Emergency Medicine

## 2017-05-12 MED ORDER — FREESTYLE LANCETS MISC: 12 refills | Status: DC

## 2017-05-12 MED ORDER — GLUCOSE BLOOD VI STRP: ORAL_STRIP | 12 refills | Status: DC

## 2017-05-12 MED ORDER — FREESTYLE LITE DEVI: 0 refills | Status: DC

## 2017-05-12 MED ORDER — ALCOHOL SWABS PADS: MEDICATED_PAD | 2 refills | Status: AC

## 2017-05-12 MED FILL — FREESTYLE LITE METER: 1 days supply | Qty: 1 | Fill #0

## 2017-05-12 MED FILL — FREESTYLE LITE TEST STRIP: 25 days supply | Qty: 100 | Fill #0

## 2017-05-12 MED FILL — FREESTYLE LANCETS: 25 days supply | Qty: 100 | Fill #0

## 2017-05-13 NOTE — Progress Notes (Signed)
[facility] [employer] [facility] [place], [place], [place] [address] [phone]  Date:  05/15/2017   Name:  Phillip [name]   DOB:  02/12/1951   MRN:  [id number]  PCP:  Darreld Mclean, MD    Chief Complaint: Follow-up (Pt here for 4 month follow up visit. )   History of Present Illness:  Phillip [name] is a 67 y.o. very pleasant male patient who presents with the following:  Here today for a follow-up visit Last seen by myself in September: He is working some hours at [employer] still but is more of less retired at this point He recently welcomed his 5th grandchild, a boy named [name] He is using flomax for BPH- this is helping some, he does not want to try a different med at this time He would like to change to his Bydureon to the [employer] '2mg'$  once a week for ease of use He did check his BP yesterday- 128/79 He is not taking HCTZ currently- he tends to use this prn for his BP, but has not needed it recently  He uses crestor every other day He uses [place] on rare occasion- needs a refill today as he will be traveling soon  Lab Results  Component Value Date   HGBA1C 7.3 (H) 01/16/2017  due for lipids, BMP, A1c today Foot exam, eye exam, colonoscopy all UTD Had zostavax in 20120- ?shingrix- he is still thinking about his   He is traveling to [place] for about a month this spring- he would like 60 pills of doxy for malaria prophylaxis  He notes that his fasting glucose may be higher than his bedtime sugars.  He will try and check at night a few times.  He is interested to see what his A1c will show today   He has noted some worsening of his peripheral neuropathy over the last several years, but still not severe. He notes this in both feet, in the great toes and into the medial foot bilaterally  He also has noted some pain in the base of his right thumb- he plans to have this injected by Andreas Blower  His wife Learta Codding and their 4  kids, 5 grands are doing well Phillip [name] is thinking of working some shifts at [employer] in [place] when he can  BP Readings from Last 3 Encounters:  05/15/17 122/80  01/16/17 (!) 146/90  08/22/16 138/82   Wt Readings from Last 3 Encounters:  05/15/17 231 lb 6.4 oz (105 kg)  01/16/17 226 lb (102.5 kg)  08/22/16 230 lb (104.3 kg)     Patient Active Problem List   Diagnosis Date Noted  . OSA on CPAP 08/12/2015  . Back pain 02/18/2015  . Ventricular tachycardia ([place]) 11/10/2014  . CAD (coronary artery disease), native coronary artery   . Variants of migraine, not elsewhere classified, without mention of intractable migraine without mention of status migrainosus 08/14/2013  . History of ischemic stroke without residual deficits 04/17/2013  . ED (erectile dysfunction) 02/01/2012  . Gout 09/05/2011  . Hyperlipidemia 09/05/2011  . Hypertriglyceridemia 09/05/2011  . Arthritis 09/05/2011  . PVC (premature ventricular contraction) 09/05/2011  . Type II or unspecified type diabetes mellitus without mention of complication, not stated as uncontrolled 06/01/2011  . Essential hypertension, benign 06/01/2011    Past Medical History:  Diagnosis Date  . Anemia   . Arthritis   . Diabetes mellitus   . Gout   .  Hyperlipidemia   . Hypertension   . Localized osteoarthritis of left knee 11/20/2012  . Migraine headache with aura   . Neuromuscular disorder ([place])    " mild periferal neauropathy  . Osteoarthritis of left hip 11/20/2012  . PVC (premature ventricular contraction)   . Sleep apnea    cpap    Past Surgical History:  Procedure Laterality Date  . CHOLECYSTECTOMY  1997  . CVA     CVA, occiptal  . CVA     CVA, occipital  . knee arthoscopy  2006   Left Knee  . left total hip    . left unicarpmental knee    . LOOP RECORDER IMPLANT N/A 04/19/2013   Procedure: LOOP RECORDER IMPLANT;  Surgeon: Evans Lance, MD;  Location: [facility] CATH LAB;  Service: Cardiovascular;   Laterality: N/A;  . PARTIAL KNEE ARTHROPLASTY Left 11/20/2012   Procedure: UNICOMPARTMENTAL KNEE;  Surgeon: Johnny Bridge, MD;  Location: [facility];  Service: Orthopedics;  Laterality: Left;  . REPLACEMENT UNICONDYLAR JOINT KNEE Left 11/20/2012   Dr Mardelle Matte  . TEE WITHOUT CARDIOVERSION N/A 04/19/2013   Procedure: TRANSESOPHAGEAL ECHOCARDIOGRAM (TEE);  Surgeon: Larey Dresser, MD;  Location: [facility];  Service: Cardiovascular;  Laterality: N/A;  . TONSILLECTOMY AND ADENOIDECTOMY     Childhood  . TOTAL HIP ARTHROPLASTY Left 11/20/2012   Dr Mardelle Matte  . TOTAL HIP ARTHROPLASTY Left 11/20/2012   Procedure: TOTAL HIP ARTHROPLASTY;  Surgeon: Johnny Bridge, MD;  Location: [facility];  Service: Orthopedics;  Laterality: Left;    Social History   Tobacco Use  . Smoking status: Never Smoker  . Smokeless tobacco: Never Used  Substance Use Topics  . Alcohol use: No    Alcohol/week: 0.0 oz  . Drug use: No    Family History  Problem Relation Age of Onset  . Cancer Mother        Breast  . Stroke Mother   . Stroke Maternal Grandmother   . Stroke Maternal Aunt   . Stroke Maternal Uncle     Allergies  Allergen Reactions  . Sulfa Antibiotics Other (See Comments)    Unknown reaction    Medication list has been reviewed and updated.  Current Outpatient Medications on File Prior to Visit  Medication Sig Dispense Refill  . acetaminophen (TYLENOL) 650 MG CR tablet Take 1,300 mg by mouth every 8 (eight) hours as needed for pain.    . Alcohol Swabs PADS Use to test up to 4 times daily 100 each 2  . allopurinol (ZYLOPRIM) 300 MG tablet Take 1 tablet (300 mg total) by mouth daily. 90 tablet 3  . blood glucose meter kit and supplies KIT Use up to four times daily as directed. Dx E13.40. 1 each 0  . Blood Glucose Monitoring Suppl (FREESTYLE LITE) DEVI Use to test up to 4 times daily 1 each 0  . BYDUREON BCISE 2 MG/0.85ML AUIJ INJECT 1 PEN INTO THE SKIN ONCE A WEEK. 3.4 mL 3  . canagliflozin (INVOKANA)  300 MG TABS tablet Take 1 tablet (300 mg total) by mouth daily. 90 tablet 3  . clobetasol cream (TEMOVATE) 8.31 % Apply 1 application topically 2 (two) times daily. 60 g 2  . clopidogrel (PLAVIX) 75 MG tablet Take 1 tablet (75 mg total) by mouth daily with breakfast. 90 tablet 1  . glucose blood (FREESTYLE LITE) test strip Use to test up to 4 times daily 100 each 12  . hydrochlorothiazide (HYDRODIURIL) 12.5 MG tablet Take 1 tablet (  12.5 mg total) by mouth daily. Use as needed for hypertension 90 tablet 3  . Lancets (FREESTYLE) lancets Use to test up to 4 times daily 100 each 12  . losartan (COZAAR) 100 MG tablet Take 1 tablet (100 mg total) by mouth daily. 90 tablet 1  . metaxalone (SKELAXIN) 800 MG tablet Take 1 tablet (800 mg total) by mouth every 6 (six) hours as needed. 60 tablet 1  . metformin (FORTAMET) 1000 MG (OSM) 24 hr tablet Take 1 tablet (1,000 mg total) by mouth 2 (two) times daily with a meal. 180 tablet 3  . nebivolol (BYSTOLIC) 10 MG tablet Take 10 mg by mouth daily.    . rosuvastatin (CRESTOR) 5 MG tablet Take 1 tab daily 90 tablet 3  . SM LANCETS 33G MISC USE TO TEST BLOOD SUGAR TWICE DAILY 400 each PRN  . tamsulosin (FLOMAX) 0.4 MG CAPS capsule Take 1 capsule (0.4 mg total) by mouth daily. 30 capsule 11  . typhoid (VIVOTIF) DR capsule Take 1 capsule by mouth every other day. 4 capsule 0  . zolpidem (AMBIEN) 5 MG tablet TAKE 1 TABLET BY MOUTH EACH NIGHT AT BEDTIME AS NEEDED FOR SLEEP 30 tablet 2   No current facility-administered medications on file prior to visit.     Review of Systems: No fever or chills No CP or SOB As per HPI- otherwise negative.   Physical Examination: Vitals:   05/15/17 0823  BP: 122/80  Pulse: 64  Temp: 97.8 F (36.6 C)  SpO2: 98%   Vitals:   05/15/17 0823  Weight: 231 lb 6.4 oz (105 kg)  Height: 6' (1.829 m)   Body mass index is 31.38 kg/m. Ideal Body Weight: Weight in (lb) to have BMI = 25: 183.9  GEN: WDWN, NAD, Non-toxic, A & O  x 3, overweight, looks well HEENT: Atraumatic, Normocephalic. Neck supple. No masses, No LAD.  Bilateral TM wnl, oropharynx normal.  PEERL,EOMI.   Ears and Nose: No external deformity. CV: RRR, No M/G/R. No JVD. No thrill. No extra heart sounds. PULM: CTA B, no wheezes, crackles, rhonchi. No retractions. No resp. distress. No accessory muscle use. EXTR: No c/c/e NEURO Normal gait.  PSYCH: Normally interactive. Conversant. Not depressed or anxious appearing.  Calm demeanor.    Assessment and Plan: Controlled type 2 diabetes mellitus without complication, without long-term current use of insulin ([place]) - Plan: Basic metabolic panel, Hemoglobin A1c  Essential hypertension - Plan: losartan (COZAAR) 100 MG tablet  Mixed hyperlipidemia - Plan: Lipid panel  Insomnia, unspecified type - Plan: zolpidem (AMBIEN) 5 MG tablet  Need for malaria prophylaxis - Plan: doxycycline (VIBRAMYCIN) 100 MG capsule  History of ischemic stroke without residual deficits - Plan: clopidogrel (PLAVIX) 75 MG tablet  Follow-up visit today Labs pending as above Refilled his prn ambien Doxycycline to use for upcoming trip to [place] Will plan further follow- up pending labs.   Signed Lamar Blinks, MD  Received labs, message to pt I am overall happy with your cholesterol- continue crestor Your A1c has gone up a bit.  I was looking over your medications - we could consider adding a DPP-4 like Januvia- I just don't know if this would lower your A1c enough to be worth the expense.  I am also certainly ok with just checking your A1c in 4 months and seeing where we are.  What do you think?   Results for orders placed or performed in visit on 35/70/17  Basic metabolic panel  Result Value Ref  Range   Sodium 137 135 - 145 mEq/L   Potassium 4.6 3.5 - 5.1 mEq/L   Chloride 104 96 - 112 mEq/L   CO2 27 19 - 32 mEq/L   Glucose, Bld 165 (H) 70 - 99 mg/dL   BUN 22 6 - 23 mg/dL   Creatinine, Ser 0.93 0.40 - 1.50 mg/dL    Calcium 9.7 8.4 - 10.5 mg/dL   GFR 85.79 >60.00 mL/min  Hemoglobin A1c  Result Value Ref Range   Hgb A1c MFr Bld 7.8 (H) 4.6 - 6.5 %  Lipid panel  Result Value Ref Range   Cholesterol 146 0 - 200 mg/dL   Triglycerides 202.0 (H) 0.0 - 149.0 mg/dL   HDL 45.60 >39.00 mg/dL   VLDL 40.4 (H) 0.0 - 40.0 mg/dL   Total CHOL/HDL Ratio 3    NonHDL 100.83   LDL cholesterol, direct  Result Value Ref Range   Direct LDL 77.0 mg/dL

## 2017-05-15 ENCOUNTER — Encounter: Payer: Self-pay | Admitting: Family Medicine

## 2017-05-15 ENCOUNTER — Ambulatory Visit (INDEPENDENT_AMBULATORY_CARE_PROVIDER_SITE_OTHER): Payer: 59 | Admitting: Family Medicine

## 2017-05-15 VITALS — BP 122/80 | HR 64 | Temp 97.8°F | Ht 72.0 in | Wt 231.4 lb

## 2017-05-15 DIAGNOSIS — E782 Mixed hyperlipidemia: Secondary | ICD-10-CM

## 2017-05-15 DIAGNOSIS — Z8673 Personal history of transient ischemic attack (TIA), and cerebral infarction without residual deficits: Secondary | ICD-10-CM | POA: Diagnosis not present

## 2017-05-15 DIAGNOSIS — E119 Type 2 diabetes mellitus without complications: Secondary | ICD-10-CM

## 2017-05-15 DIAGNOSIS — G4700 Insomnia, unspecified: Secondary | ICD-10-CM

## 2017-05-15 DIAGNOSIS — I10 Essential (primary) hypertension: Secondary | ICD-10-CM | POA: Diagnosis not present

## 2017-05-15 DIAGNOSIS — Z298 Encounter for other specified prophylactic measures: Secondary | ICD-10-CM | POA: Diagnosis not present

## 2017-05-15 LAB — LIPID PANEL
CHOL/HDL RATIO: 3
CHOLESTEROL: 146 mg/dL (ref 0–200)
HDL: 45.6 mg/dL (ref >39.00)
NonHDL: 100.83
TRIGLYCERIDES: 202 mg/dL — AB (ref 0.0–149.0)
VLDL: 40.4 mg/dL — AB (ref 0.0–40.0)

## 2017-05-15 LAB — LDL CHOLESTEROL, DIRECT: LDL DIRECT: 77 mg/dL

## 2017-05-15 LAB — BASIC METABOLIC PANEL
BUN: 22 mg/dL (ref 6–23)
CHLORIDE: 104 meq/L (ref 96–112)
CO2: 27 meq/L (ref 19–32)
CREATININE: 0.93 mg/dL (ref 0.40–1.50)
Calcium: 9.7 mg/dL (ref 8.4–10.5)
GFR: 85.79 mL/min (ref >60.00)
Glucose, Bld: 165 mg/dL — ABNORMAL HIGH (ref 70–99)
Potassium: 4.6 mEq/L (ref 3.5–5.1)
Sodium: 137 mEq/L (ref 135–145)

## 2017-05-15 LAB — HEMOGLOBIN A1C: Hgb A1c MFr Bld: 7.8 % — ABNORMAL HIGH (ref 4.6–6.5)

## 2017-05-15 MED ORDER — ZOLPIDEM TARTRATE 5 MG PO TABS: ORAL_TABLET | ORAL | 2 refills | Status: DC

## 2017-05-15 MED ORDER — LOSARTAN POTASSIUM 100 MG PO TABS: 100.0000 mg | ORAL_TABLET | Freq: Every day | ORAL | 3 refills | Status: DC

## 2017-05-15 MED ORDER — DOXYCYCLINE HYCLATE 100 MG PO CAPS
100.0000 mg | ORAL_CAPSULE | Freq: Every day | ORAL | 0 refills | Status: DC
Start: 2017-05-15 — End: 2017-08-30

## 2017-05-15 MED ORDER — CLOPIDOGREL BISULFATE 75 MG PO TABS: 75.0000 mg | ORAL_TABLET | Freq: Every day | ORAL | 3 refills | Status: DC

## 2017-05-15 MED FILL — ZOLPIDEM TARTRATE 5 MG TABL: 5 | 30 days supply | Qty: 30 | Fill #0

## 2017-05-15 MED FILL — DOXYCYCLINE HYCLATE 100 MG: 100 | 60 days supply | Qty: 60 | Fill #0

## 2017-05-15 NOTE — Patient Instructions (Addendum)
It was great to see you today as always!  I will be in touch with your labs, say hello to [facility] for me Try some [employer] brand support socks

## 2017-06-05 ENCOUNTER — Encounter: Payer: Self-pay | Admitting: Family Medicine

## 2017-06-15 MED FILL — FREESTYLE LITE TEST STRIP: 25 days supply | Qty: 100 | Fill #1

## 2017-06-15 MED FILL — BYDUREON 2 MG PEN INJECT: 2 | 84 days supply | Qty: 12 | Fill #2

## 2017-06-15 MED FILL — ZOLPIDEM TARTRATE 5 MG TABL: 5 | 30 days supply | Qty: 30 | Fill #1

## 2017-07-01 ENCOUNTER — Encounter: Payer: Self-pay | Admitting: Neurology

## 2017-07-18 ENCOUNTER — Other Ambulatory Visit: Payer: Self-pay | Admitting: Family Medicine

## 2017-07-18 DIAGNOSIS — M1000 Idiopathic gout, unspecified site: Secondary | ICD-10-CM

## 2017-07-18 MED FILL — LOSARTAN POTASSIUM 100 MG T: 100 | 90 days supply | Qty: 90 | Fill #4

## 2017-07-18 MED FILL — CLOPIDOGREL 75 MG TABLET: 75 | 90 days supply | Qty: 90 | Fill #4

## 2017-07-18 MED FILL — METFORMIN HCL ER 500 MG TAB: 500 | 90 days supply | Qty: 360 | Fill #2

## 2017-07-18 MED FILL — ZOLPIDEM TARTRATE 5 MG TABL: 5 | 30 days supply | Qty: 30 | Fill #2

## 2017-07-19 MED FILL — ALLOPURINOL 300 MG TAB: 300 | 90 days supply | Qty: 90 | Fill #0

## 2017-07-25 MED FILL — BYSTOLIC 10 MG TABLET: 10 | 90 days supply | Qty: 90 | Fill #0

## 2017-07-25 MED FILL — INVOKANA 300 MG TABLET: 300 | 90 days supply | Qty: 90 | Fill #1

## 2017-08-14 ENCOUNTER — Ambulatory Visit: Payer: 59 | Admitting: Neurology

## 2017-08-25 ENCOUNTER — Other Ambulatory Visit: Payer: Self-pay | Admitting: Family Medicine

## 2017-08-25 ENCOUNTER — Encounter: Payer: Self-pay | Admitting: Family Medicine

## 2017-08-25 DIAGNOSIS — R350 Frequency of micturition: Principal | ICD-10-CM

## 2017-08-25 DIAGNOSIS — N401 Enlarged prostate with lower urinary tract symptoms: Secondary | ICD-10-CM

## 2017-08-25 MED FILL — TAMSULOSIN HCL 0.4 MG CAP: 0.4 | 90 days supply | Qty: 90 | Fill #0

## 2017-08-25 MED FILL — BYDUREON 2 MG PEN INJECT: 2 | 84 days supply | Qty: 12 | Fill #3

## 2017-08-29 DIAGNOSIS — E785 Hyperlipidemia, unspecified: Secondary | ICD-10-CM | POA: Diagnosis not present

## 2017-08-29 DIAGNOSIS — E119 Type 2 diabetes mellitus without complications: Secondary | ICD-10-CM | POA: Diagnosis not present

## 2017-08-29 DIAGNOSIS — E668 Other obesity: Secondary | ICD-10-CM | POA: Diagnosis not present

## 2017-08-29 DIAGNOSIS — I472 Ventricular tachycardia: Secondary | ICD-10-CM | POA: Diagnosis not present

## 2017-08-29 DIAGNOSIS — I10 Essential (primary) hypertension: Secondary | ICD-10-CM | POA: Diagnosis not present

## 2017-08-29 DIAGNOSIS — I2510 Atherosclerotic heart disease of native coronary artery without angina pectoris: Secondary | ICD-10-CM | POA: Diagnosis not present

## 2017-08-29 DIAGNOSIS — R9431 Abnormal electrocardiogram [ECG] [EKG]: Secondary | ICD-10-CM | POA: Diagnosis not present

## 2017-08-29 MED FILL — ROSUVASTATIN CALCIUM 10 MG: 10 | 84 days supply | Qty: 36 | Fill #0

## 2017-08-30 ENCOUNTER — Ambulatory Visit: Payer: 59 | Admitting: Neurology

## 2017-08-30 ENCOUNTER — Encounter: Payer: Self-pay | Admitting: Neurology

## 2017-08-30 VITALS — BP 135/85 | HR 77 | Ht 72.0 in | Wt 227.0 lb

## 2017-08-30 DIAGNOSIS — G4733 Obstructive sleep apnea (adult) (pediatric): Secondary | ICD-10-CM | POA: Diagnosis not present

## 2017-08-30 DIAGNOSIS — Z9989 Dependence on other enabling machines and devices: Secondary | ICD-10-CM | POA: Diagnosis not present

## 2017-08-30 NOTE — Patient Instructions (Signed)

## 2017-08-30 NOTE — Progress Notes (Signed)
SLEEP MEDICINE CLINIC   Provider:  Larey Seat, M D  Referring Provider: Darreld Mclean, MD Primary Care Physician:  Darreld Mclean, MD  Chief Complaint  Patient presents with  . Follow-up    pt alone rm 10. pt states that he was in [place] on a mission trip  from march 2-24th.pt states that he has used his machine upon arrival but i did not have data for the month of april. DME [employer].     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 Lorelei [name] for OSA , transfer of sleep care. The patient suffered an occipital stroke in 2014. He has a long history of migraines. He had a last sleep study in [place], by Dr. Scarlette [name]. Phillip [name] suffered the occipital stroke realizing that he had a similar visual aura to a migraine manifestation. This remains worrisome for him as he should not take Triptan- medication.After his presumed visual aura persisted for about 14 hours he was seen by one of his colleagues in the same office, Dr. Arlyss [name], and initiated a stroke workup.Phillip [name] also suffers from diabetes and has a diabetic induced neuropathy on the bottom of both feet. He is still taking Onglyza,. He is seen today because he had not had a repeat sleep study for the last 12 years. He has been on the same setting and on the same type CPAP machine. He is interested in reevaluating his sleep apnea and treatment options.He recalls that about 8 years ago he was on a medical mission and used his original CPAP machine on a car battery and it broke. He replaced the  the same type however at the same settings.   Interval history from 08/12/2015 Phillip [name] is here after his recent sleep study performed on 05/13/2015, during which he was diagnosed again with sleep apnea at an AHI of 49.8 no REM sleep was noted during the diagnostic part, supine AHI was 54.8 and nonsupine 27.3 average end tidal CO2 was 30.64 torr, no prolonged oxygen desaturations were noted.  Occasional premature ventricular beats were seen, the patient was titrated to CPAP and was doing well on 11 cm water pressure. A 2 cm flex function was requested. Today we also having  the opportunity to see the first download residual AHI is 2.6, at 11 cm water pressure with an average user time of 6 hours and 51 minutes and 100% compliance for days of use and over 4 hours of daily use. Phillip [name] would like an auto set function that can be used as he loses weight.  Sleep medical history and family sleep history: Brother on CPAP, his "sister should be".  Social history: married, no tobacco use ever, no ETOH , caffeine user - plain iced tea , 3-4 glasses a day.  Interval history from 08/11/2016, as the pleasure of meeting with Phillip [name] today for a yearly compliance visit the patient has again reached 97% compliance with an average user time of 6 hours and 57 minutes the pressure set at 11 cm water with 2 cm EPR the residual AHI is 3.0 there are some air leaks but no major ones, there is no evidence of treatment emergent central apneas. He likes his machine and he feels that he is benefiting his sleep quality.  He has a new grand child.   Interval 08-30-2017, at the pleasure of meeting Ms. Dr. Shanon [name] today for his regularly CPAP compliance.  The patient recently went through a mission in [place] and hemostasis was able to use his CPAP.  However he has noted that when electricity was not provided or available to run his machine he was also not recharging his batteries.  He is able to sleep much better on the CPAP and would like to be able to use a CPAP on prolonged flights areas.  We will discuss a travel CPAP may be a refurbished travel CPAP that would be cheaper to purchase. His compliance download is as usual, excellent.  His machine is set at 11 cmH2O was 2 cm EPR, his residual AHI was 3.3 there are some moderate air leaks, use of time on average was 6 hours and 46 minutes 100% of days and 29 of  these days over 4 hours consecutive views were documented establishing a compliance of 97%.  Review of Systems: Out of a complete 14 system review, the patient complains of only the following symptoms, and all other reviewed systems are negative.  The fatigue severity score was today endorsed at 13points and the Epworth sleepiness score at 7 points. 0 points were endorsed on the 15 point geriatric depression score.  Total knee replacement 4 years ago. Arthroscopic left knee 15 years ago. Knee pain while gardening, golf not affected.  Neck pain, especially on long flights.     Social History   Socioeconomic History  . Marital status: Married    Spouse name: Phillip [name]  . Number of children: 4  . Years of education: 57  . Highest education level: Not on file  Occupational History  . Occupation: MD    Employer: [employer]  Social Needs  . Financial resource strain: Not on file  . Food insecurity:    Worry: Not on file    Inability: Not on file  . Transportation needs:    Medical: Not on file    Non-medical: Not on file  Tobacco Use  . Smoking status: Never Smoker  . Smokeless tobacco: Never Used  Substance and Sexual Activity  . Alcohol use: No    Alcohol/week: 0.0 oz  . Drug use: No  . Sexual activity: Not on file  Lifestyle  . Physical activity:    Days per week: Not on file    Minutes per session: Not on file  . Stress: Not on file  Relationships  . Social connections:    Talks on phone: Not on file    Gets together: Not on file    Attends religious service: Not on file    Active member of club or organization: Not on file    Attends meetings of clubs or organizations: Not on file    Relationship status: Not on file  . Intimate partner violence:    Fear of current or ex partner: Not on file    Emotionally abused: Not on file    Physically abused: Not on file    Forced sexual activity: Not on file  Other Topics Concern  . Not on file  Social History Narrative    Patient lives at home with family.   Caffeine Use: occasionally tea    Family History  Problem Relation Age of Onset  . Cancer Mother        Breast  . Stroke Mother   . Stroke Maternal Grandmother   . Stroke Maternal Aunt   . Stroke Maternal Uncle     Past Medical History:  Diagnosis Date  . Anemia   . Arthritis   .  Diabetes mellitus   . Gout   . Hyperlipidemia   . Hypertension   . Localized osteoarthritis of left knee 11/20/2012  . Migraine headache with aura   . Neuromuscular disorder ([place])    " mild periferal neauropathy  . Osteoarthritis of left hip 11/20/2012  . PVC (premature ventricular contraction)   . Sleep apnea    cpap    Past Surgical History:  Procedure Laterality Date  . CHOLECYSTECTOMY  1997  . CVA     CVA, occiptal  . CVA     CVA, occipital  . knee arthoscopy  2006   Left Knee  . left total hip    . left unicarpmental knee    . LOOP RECORDER IMPLANT N/A 04/19/2013   Procedure: LOOP RECORDER IMPLANT;  Surgeon: Evans Lance, MD;  Location: [facility] CATH LAB;  Service: Cardiovascular;  Laterality: N/A;  . PARTIAL KNEE ARTHROPLASTY Left 11/20/2012   Procedure: UNICOMPARTMENTAL KNEE;  Surgeon: Johnny Bridge, MD;  Location: [facility];  Service: Orthopedics;  Laterality: Left;  . REPLACEMENT UNICONDYLAR JOINT KNEE Left 11/20/2012   Dr Mardelle Matte  . TEE WITHOUT CARDIOVERSION N/A 04/19/2013   Procedure: TRANSESOPHAGEAL ECHOCARDIOGRAM (TEE);  Surgeon: Larey Dresser, MD;  Location: [facility];  Service: Cardiovascular;  Laterality: N/A;  . TONSILLECTOMY AND ADENOIDECTOMY     Childhood  . TOTAL HIP ARTHROPLASTY Left 11/20/2012   Dr Mardelle Matte  . TOTAL HIP ARTHROPLASTY Left 11/20/2012   Procedure: TOTAL HIP ARTHROPLASTY;  Surgeon: Johnny Bridge, MD;  Location: [facility];  Service: Orthopedics;  Laterality: Left;    Current Outpatient Medications  Medication Sig Dispense Refill  . acetaminophen (TYLENOL) 650 MG CR tablet Take 1,300 mg by mouth every 8 (eight) hours as  needed for pain.    . Alcohol Swabs PADS Use to test up to 4 times daily 100 each 2  . allopurinol (ZYLOPRIM) 300 MG tablet TAKE 1 TABLET (300 MG TOTAL) BY MOUTH DAILY. 90 tablet 3  . blood glucose meter kit and supplies KIT Use up to four times daily as directed. Dx E13.40. 1 each 0  . Blood Glucose Monitoring Suppl (FREESTYLE LITE) DEVI Use to test up to 4 times daily 1 each 0  . BYDUREON BCISE 2 MG/0.85ML AUIJ INJECT 1 PEN INTO THE SKIN ONCE A WEEK. 3.4 mL 3  . canagliflozin (INVOKANA) 300 MG TABS tablet Take 1 tablet (300 mg total) by mouth daily. 90 tablet 3  . clobetasol cream (TEMOVATE) 4.25 % Apply 1 application topically 2 (two) times daily. 60 g 2  . clopidogrel (PLAVIX) 75 MG tablet Take 1 tablet (75 mg total) by mouth daily with breakfast. 90 tablet 3  . glucose blood (FREESTYLE LITE) test strip Use to test up to 4 times daily 100 each 12  . Lancets (FREESTYLE) lancets Use to test up to 4 times daily 100 each 12  . losartan (COZAAR) 100 MG tablet Take 1 tablet (100 mg total) by mouth daily. 90 tablet 3  . metaxalone (SKELAXIN) 800 MG tablet Take 1 tablet (800 mg total) by mouth every 6 (six) hours as needed. 60 tablet 1  . metformin (FORTAMET) 1000 MG (OSM) 24 hr tablet Take 1 tablet (1,000 mg total) by mouth 2 (two) times daily with a meal. 180 tablet 3  . nebivolol (BYSTOLIC) 10 MG tablet Take 10 mg by mouth daily.    . rosuvastatin (CRESTOR) 5 MG tablet Take 1 tab daily (Patient taking differently: Take 1 tab three times  a wk.) 90 tablet 3  . SM LANCETS 33G MISC USE TO TEST BLOOD SUGAR TWICE DAILY 400 each PRN  . tamsulosin (FLOMAX) 0.4 MG CAPS capsule TAKE 1 CAPSULE BY MOUTH ONCE DAILY 30 capsule 11  . zolpidem (AMBIEN) 5 MG tablet TAKE 1 TABLET BY MOUTH EACH NIGHT AT BEDTIME AS NEEDED FOR SLEEP 30 tablet 2   No current facility-administered medications for this visit.     Allergies as of 08/30/2017 - Review Complete 08/30/2017  Allergen Reaction Noted  . Sulfa antibiotics  Other (See Comments) 06/01/2011    Vitals: BP 135/85   Pulse 77   Ht 6' (1.829 m)   Wt 227 lb (103 kg)   BMI 30.79 kg/m  Last Weight:  Wt Readings from Last 1 Encounters:  08/30/17 227 lb (103 kg)   QVZ:DGLO mass index is 30.79 kg/m.     Last Height:   Ht Readings from Last 1 Encounters:  08/30/17 6' (1.829 m)    Physical exam:  General: The patient is awake, alert and appears not in acute distress. The patient is well groomed. Head: Normocephalic, atraumatic. Neck is supple.  Mallampati 1, the airway is short and ends abruptly is not narrow. The palate is of normal shape the uvula is midline the tongue is midline without fasciculation or tremor. neck circumference: 17. 5"  Nasal airflow unrestricted , Mild Retrognathia is seen.   Cardiovascular:  Regular rate and rhythm , without murmurs or carotid bruit, and without distended neck veins. Respiratory: Lungs are clear to auscultation. Skin:  Without evidence of edema, or rash Trunk: BMI is elevated but again less than last year ! BMI 30.7.  The patient's posture is erect    Neurologic exam : The patient is awake and alert, oriented to place and time.   Memory subjective  described as intact.  Attention span & concentration ability appears normal.  Speech is fluent,  without dysarthria, dysphonia or aphasia.  Mood and affect are appropriate.  Cranial nerves: Pupils are equal and briskly reactive to light.  Visual fields by finger perimetry are restricted just to upper right quadrant, not the full quadrant. Pie in the sky.  Facial motor strength is symmetric and tongue and uvula move midline. Shoulder shrug was symmetrical.  Deep tendon reflexes: in the  upper and lower extremities are symmetric and intact.  The patient was advised of the nature of the diagnosed sleep disorder , the treatment options and risks for general a health and wellness arising from not treating the condition. OSA follow up, not stroke follow up.  I  spent more than 15 minutes of face to face time with the patient. Greater than 50% of time was spent in counseling and coordination of care. We have discussed the diagnosis and differential and I answered the patient's questions.   On 08-14-12 the patient was evaluated for a stroke at [facility].  Had an MRI of the brain that day, after review of the MRI in the emergency department stroke service and an occipital stroke was identified.  Assessment:  After physical and neurologic examination, review of laboratory studies,  Personal review of imaging studies, reports of other /same  Imaging studies ,  Results of polysomnography/ neurophysiology testing and pre-existing records as far as provided in visit., my assessment is   1) Phillip [name] has done excellently with his current CPAP treatment , again achieving 100% compliance. 2) Obesity, BMI: further  reduction will  benefit hypertension, gout and diabetes.  He has successfully reduced BMI to 30.7.    Plan:  Rv in 10 month with MD .    Larey Seat, MD  08/30/2017   CC: Darreld Mclean, Md [address] [place], [place] [id number]   CC; Dr Antony Contras, primary neurologist.

## 2017-09-07 DIAGNOSIS — I10 Essential (primary) hypertension: Secondary | ICD-10-CM | POA: Diagnosis not present

## 2017-09-07 DIAGNOSIS — I2510 Atherosclerotic heart disease of native coronary artery without angina pectoris: Secondary | ICD-10-CM | POA: Diagnosis not present

## 2017-09-07 DIAGNOSIS — E119 Type 2 diabetes mellitus without complications: Secondary | ICD-10-CM | POA: Diagnosis not present

## 2017-09-07 DIAGNOSIS — I472 Ventricular tachycardia: Secondary | ICD-10-CM | POA: Diagnosis not present

## 2017-09-07 DIAGNOSIS — E668 Other obesity: Secondary | ICD-10-CM | POA: Diagnosis not present

## 2017-09-07 DIAGNOSIS — E785 Hyperlipidemia, unspecified: Secondary | ICD-10-CM | POA: Diagnosis not present

## 2017-09-07 DIAGNOSIS — R9431 Abnormal electrocardiogram [ECG] [EKG]: Secondary | ICD-10-CM | POA: Diagnosis not present

## 2017-09-15 DIAGNOSIS — L570 Actinic keratosis: Secondary | ICD-10-CM | POA: Diagnosis not present

## 2017-09-15 DIAGNOSIS — L300 Nummular dermatitis: Secondary | ICD-10-CM | POA: Diagnosis not present

## 2017-09-15 DIAGNOSIS — D1801 Hemangioma of skin and subcutaneous tissue: Secondary | ICD-10-CM | POA: Diagnosis not present

## 2017-09-15 DIAGNOSIS — D485 Neoplasm of uncertain behavior of skin: Secondary | ICD-10-CM | POA: Diagnosis not present

## 2017-09-15 DIAGNOSIS — L821 Other seborrheic keratosis: Secondary | ICD-10-CM | POA: Diagnosis not present

## 2017-09-15 DIAGNOSIS — I788 Other diseases of capillaries: Secondary | ICD-10-CM | POA: Diagnosis not present

## 2017-09-15 DIAGNOSIS — D225 Melanocytic nevi of trunk: Secondary | ICD-10-CM | POA: Diagnosis not present

## 2017-09-15 DIAGNOSIS — L820 Inflamed seborrheic keratosis: Secondary | ICD-10-CM | POA: Diagnosis not present

## 2017-09-27 ENCOUNTER — Ambulatory Visit: Payer: Self-pay | Admitting: Family Medicine

## 2017-10-10 NOTE — Progress Notes (Addendum)
[facility] [address] [place], [place] [address] [phone]  Date:  10/11/2017   Name:  Phillip [name]   DOB:  1950-05-29   MRN:  [id number]  PCP:  Darreld Mclean, MD    Chief Complaint: Annual Exam   History of Present Illness:  Phillip [name] is a 67 y.o. very pleasant male patient who presents with the following:  Here today for a CPE - he is on the [facility] "focus" plan History of DM, hyperlipidemia, HTN, CAD, OSA, CVA 2014  Cardiologist is Dr. Joyice Faster neurology is Dr. 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 Lorelei Pont for OSA , transfer of sleep care. The patient suffered an occipital stroke in 2014. He has a long history of migraines. He had a last sleep study in [place], by Dr. Scarlette Calico. 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 This remains worrisome for him as he should not take Triptan- medication.After his presumed visual aura persisted for about 14 hours he was seen by one of his colleagues in the same office, Dr. 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 He is still taking Onglyza,. He is seen today because he had not had a repeat sleep study for the last 12 years. He has been on the same setting and on the same type CPAP machine. He is interested in reevaluating his sleep apnea and treatment options.He recalls that about 8 years ago he was on a medical mission and used his original CPAP machine on a car battery and it broke. He replaced the  the same type however at the same settings  Needs foot exam today Labs: will do today Shingrix: discussed with him today, he is not sure that he wants to have his done, did have zostavax a few years ago   Wt  Readings from Last 3 Encounters:  10/11/17 228 lb 6.4 oz (103.6 kg)  08/30/17 227 lb (103 kg)  05/15/17 231 lb 6.4 oz (105 kg)   He is continuing to struggle with weight and tries to stay in the 220lb range.   He has been traveling a lot and maybe not eating as well   He did get to go to [facility] with his son Phillip [name] for a week and they had a great time Her daughter Phillip [name] is coming in next week for a 2 month stay- visiting from [place] He had gone to see Phillip [name] recently and had some EKG change- a stress test looked pretty good, until the end when he started having some VT.   He held his BB for this study.  Went back yesterday to repeat the study on the BB and it was a bit better. They plan to have Caryl Comes look at his study and see if anything else is needed   He is having some pain in the base of his right thumb but thinks this is due to hanging onto the treadmill bar He had a recent skin check per Dr. Elvera Lennox, all ok, had some AKs/ SKs frozen off   Lipids checked in January so will not repeat today Lab Results  Component Value Date   PSA 1.80 01/16/2017  PSA 1.65 07/21/2015   PSA 2.09 07/25/2014    Patient Active Problem List   Diagnosis Date Noted  . OSA on CPAP 08/12/2015  . Back pain 02/18/2015  . Ventricular tachycardia ([place]) 11/10/2014  . CAD (coronary artery disease), native coronary artery   . Variants of migraine, not elsewhere classified, without mention of intractable migraine without mention of status migrainosus 08/14/2013  . History of ischemic stroke without residual deficits 04/17/2013  . ED (erectile dysfunction) 02/01/2012  . Gout 09/05/2011  . Hyperlipidemia 09/05/2011  . Hypertriglyceridemia 09/05/2011  . Arthritis 09/05/2011  . PVC (premature ventricular contraction) 09/05/2011  . Type II or unspecified type diabetes mellitus without mention of complication, not stated as uncontrolled 06/01/2011  . Essential hypertension, benign 06/01/2011    Past  Medical History:  Diagnosis Date  . Anemia   . Arthritis   . Diabetes mellitus   . Gout   . Hyperlipidemia   . Hypertension   . Localized osteoarthritis of left knee 11/20/2012  . Migraine headache with aura   . Neuromuscular disorder ([place])    " mild periferal neauropathy  . Osteoarthritis of left hip 11/20/2012  . PVC (premature ventricular contraction)   . Sleep apnea    cpap    Past Surgical History:  Procedure Laterality Date  . CHOLECYSTECTOMY  1997  . CVA     CVA, occiptal  . CVA     CVA, occipital  . knee arthoscopy  2006   Left Knee  . left total hip    . left unicarpmental knee    . LOOP RECORDER IMPLANT N/A 04/19/2013   Procedure: LOOP RECORDER IMPLANT;  Surgeon: Evans Lance, MD;  Location: [facility] CATH LAB;  Service: Cardiovascular;  Laterality: N/A;  . PARTIAL KNEE ARTHROPLASTY Left 11/20/2012   Procedure: UNICOMPARTMENTAL KNEE;  Surgeon: Johnny Bridge, MD;  Location: [facility];  Service: Orthopedics;  Laterality: Left;  . REPLACEMENT UNICONDYLAR JOINT KNEE Left 11/20/2012   Dr Mardelle Matte  . TEE WITHOUT CARDIOVERSION N/A 04/19/2013   Procedure: TRANSESOPHAGEAL ECHOCARDIOGRAM (TEE);  Surgeon: Larey Dresser, MD;  Location: [facility];  Service: Cardiovascular;  Laterality: N/A;  . TONSILLECTOMY AND ADENOIDECTOMY     Childhood  . TOTAL HIP ARTHROPLASTY Left 11/20/2012   Dr Mardelle Matte  . TOTAL HIP ARTHROPLASTY Left 11/20/2012   Procedure: TOTAL HIP ARTHROPLASTY;  Surgeon: Johnny Bridge, MD;  Location: [facility];  Service: Orthopedics;  Laterality: Left;    Social History   Tobacco Use  . Smoking status: Never Smoker  . Smokeless tobacco: Never Used  Substance Use Topics  . Alcohol use: No    Alcohol/week: 0.0 oz  . Drug use: No    Family History  Problem Relation Age of Onset  . Cancer Mother        Breast  . Stroke Mother   . Stroke Maternal Grandmother   . Stroke Maternal Aunt   . Stroke Maternal Uncle     Allergies  Allergen Reactions  . Sulfa  Antibiotics Other (See Comments)    Unknown reaction    Medication list has been reviewed and updated.  Current Outpatient Medications on File Prior to Visit  Medication Sig Dispense Refill  . acetaminophen (TYLENOL) 650 MG CR tablet Take 1,300 mg by mouth every 8 (eight) hours as needed for pain.    . Alcohol Swabs PADS Use to test up to 4 times daily 100 each 2  . allopurinol (ZYLOPRIM) 300 MG tablet TAKE 1 TABLET (300  MG TOTAL) BY MOUTH DAILY. 90 tablet 3  . blood glucose meter kit and supplies KIT Use up to four times daily as directed. Dx E13.40. 1 each 0  . Blood Glucose Monitoring Suppl (FREESTYLE LITE) DEVI Use to test up to 4 times daily 1 each 0  . BYDUREON BCISE 2 MG/0.85ML AUIJ INJECT 1 PEN INTO THE SKIN ONCE A WEEK. 3.4 mL 3  . canagliflozin (INVOKANA) 300 MG TABS tablet Take 1 tablet (300 mg total) by mouth daily. 90 tablet 3  . clobetasol cream (TEMOVATE) 1.61 % Apply 1 application topically as needed.    . clopidogrel (PLAVIX) 75 MG tablet Take 1 tablet (75 mg total) by mouth daily with breakfast. 90 tablet 3  . glucose blood (FREESTYLE LITE) test strip Use to test up to 4 times daily 100 each 12  . Lancets (FREESTYLE) lancets Use to test up to 4 times daily 100 each 12  . losartan (COZAAR) 100 MG tablet Take 1 tablet (100 mg total) by mouth daily. 90 tablet 3  . metaxalone (SKELAXIN) 800 MG tablet Take 1 tablet (800 mg total) by mouth every 6 (six) hours as needed. 60 tablet 1  . metFORMIN (GLUCOPHAGE-XR) 500 MG 24 hr tablet Take 500 mg by mouth 2 (two) times daily.  3  . nebivolol (BYSTOLIC) 10 MG tablet Take 10 mg by mouth daily.    . rosuvastatin (CRESTOR) 5 MG tablet Take 5 mg by mouth 3 (three) times a week. Take one tablet (5 mg total) on Monday, Wednesday, Friday    . SM LANCETS 33G MISC USE TO TEST BLOOD SUGAR TWICE DAILY 400 each PRN  . tamsulosin (FLOMAX) 0.4 MG CAPS capsule TAKE 1 CAPSULE BY MOUTH ONCE DAILY 30 capsule 11  . zolpidem (AMBIEN) 5 MG tablet TAKE 1  TABLET BY MOUTH EACH NIGHT AT BEDTIME AS NEEDED FOR SLEEP 30 tablet 2   No current facility-administered medications on file prior to visit.     Review of Systems:  As per HPI- otherwise negative. No GU symptoms No CP   Physical Examination: Vitals:   10/11/17 0846  BP: 140/82  Pulse: 64  Resp: 16  SpO2: 98%   Vitals:   10/11/17 0846  Weight: 228 lb 6.4 oz (103.6 kg)  Height: 6' (1.829 m)   Body mass index is 30.98 kg/m. Ideal Body Weight: Weight in (lb) to have BMI = 25: 183.9  GEN: WDWN, NAD, Non-toxic, A & O x 3, overweight, looks well  HEENT: Atraumatic, Normocephalic. Neck supple. No masses, No LAD. Bilateral TM wnl, oropharynx normal.  PEERL,EOMI. Ears and Nose: No external deformity. CV: RRR, No M/G/R. No JVD. No thrill. No extra heart sounds. PULM: CTA B, no wheezes, crackles, rhonchi. No retractions. No resp. distress. No accessory muscle use. ABD: S, NT, ND. No rebound. No HSM. EXTR: No c/c/e NEURO Normal gait.  PSYCH: Normally interactive. Conversant. Not depressed or anxious appearing.  Calm demeanor.  Normal foot exam today   Assessment and Plan: Physical exam  Controlled type 2 diabetes mellitus without complication, without long-term current use of insulin ([place]) - Plan: Comprehensive metabolic panel, Hemoglobin A1c, metFORMIN (GLUCOPHAGE-XR) 500 MG 24 hr tablet  Essential hypertension - Plan: CBC, losartan (COZAAR) 100 MG tablet  Mixed hyperlipidemia  Insomnia, unspecified type - Plan: zolpidem (AMBIEN) 5 MG tablet  History of ischemic stroke without residual deficits - Plan: clopidogrel (PLAVIX) 75 MG tablet  Benign prostatic hyperplasia with urinary frequency - Plan: PSA  Prostate cancer  screening - Plan: PSA  OSA on CPAP  Here today for a CPE He was noted to have VT on recent stress per Dr. Wynonia [name], they are looking at this with electrophys BP is under good control Will check on A1c today; he is currently taking metformin 1000 BID,  invokana, bydurean Requests ambien for insomnia-  NCCSR ok, got 30 pills in March of this year    Signed Lamar Blinks, MD  Received his labs 6/13- message to pt Results for orders placed or performed in visit on 10/11/17  Comprehensive metabolic panel  Result Value Ref Range   Sodium 137 135 - 145 mEq/L   Potassium 4.3 3.5 - 5.1 mEq/L   Chloride 102 96 - 112 mEq/L   CO2 26 19 - 32 mEq/L   Glucose, Bld 167 (H) 70 - 99 mg/dL   BUN 22 6 - 23 mg/dL   Creatinine, Ser 1.01 0.40 - 1.50 mg/dL   Total Bilirubin 0.6 0.2 - 1.2 mg/dL   Alkaline Phosphatase 63 39 - 117 U/L   AST 16 0 - 37 U/L   ALT 22 0 - 53 U/L   Total Protein 6.4 6.0 - 8.3 g/dL   Albumin 4.2 3.5 - 5.2 g/dL   Calcium 9.6 8.4 - 10.5 mg/dL   GFR 77.91 >60.00 mL/min  CBC  Result Value Ref Range   WBC 4.5 4.0 - 10.5 K/uL   RBC 4.49 4.22 - 5.81 Mil/uL   Platelets 265.0 150.0 - 400.0 K/uL   Hemoglobin 14.2 13.0 - 17.0 g/dL   HCT 42.0 39.0 - 52.0 %   MCV 93.6 78.0 - 100.0 fl   MCHC 33.9 30.0 - 36.0 g/dL   RDW 12.8 11.5 - 15.5 %  Hemoglobin A1c  Result Value Ref Range   Hgb A1c MFr Bld 7.9 (H) 4.6 - 6.5 %  PSA  Result Value Ref Range   PSA 2.14 0.10 - 1.93 ng/mL    Metabolic profile looks fine CBC ok Your A1c is about the same as in January.   Let me know if you want to make any change to your regimen, or we can just keep plugging along! Lab Results      Component                Value               Date                      PSA                      2.14                10/11/2017                PSA                      1.80                01/16/2017                PSA                      1.65                07/21/2015           Your PSA did go up a bit from last check- however  it 2016 you were at 2.09 so not way out of line.  I might suggest that we do a 6 month repeat to look for any upward trend   Take care, see you in about 6 months.  Enjoy your family vacation!

## 2017-10-11 ENCOUNTER — Encounter: Payer: Self-pay | Admitting: Family Medicine

## 2017-10-11 ENCOUNTER — Ambulatory Visit (INDEPENDENT_AMBULATORY_CARE_PROVIDER_SITE_OTHER): Payer: 59 | Admitting: Family Medicine

## 2017-10-11 VITALS — BP 140/82 | HR 64 | Resp 16 | Ht 72.0 in | Wt 228.4 lb

## 2017-10-11 DIAGNOSIS — E782 Mixed hyperlipidemia: Secondary | ICD-10-CM | POA: Diagnosis not present

## 2017-10-11 DIAGNOSIS — Z125 Encounter for screening for malignant neoplasm of prostate: Secondary | ICD-10-CM

## 2017-10-11 DIAGNOSIS — Z0000 Encounter for general adult medical examination without abnormal findings: Secondary | ICD-10-CM

## 2017-10-11 DIAGNOSIS — G4733 Obstructive sleep apnea (adult) (pediatric): Secondary | ICD-10-CM | POA: Diagnosis not present

## 2017-10-11 DIAGNOSIS — Z8673 Personal history of transient ischemic attack (TIA), and cerebral infarction without residual deficits: Secondary | ICD-10-CM

## 2017-10-11 DIAGNOSIS — G4700 Insomnia, unspecified: Secondary | ICD-10-CM | POA: Diagnosis not present

## 2017-10-11 DIAGNOSIS — R350 Frequency of micturition: Secondary | ICD-10-CM

## 2017-10-11 DIAGNOSIS — I10 Essential (primary) hypertension: Secondary | ICD-10-CM

## 2017-10-11 DIAGNOSIS — Z9989 Dependence on other enabling machines and devices: Secondary | ICD-10-CM | POA: Diagnosis not present

## 2017-10-11 DIAGNOSIS — E119 Type 2 diabetes mellitus without complications: Secondary | ICD-10-CM

## 2017-10-11 DIAGNOSIS — N401 Enlarged prostate with lower urinary tract symptoms: Secondary | ICD-10-CM

## 2017-10-11 LAB — COMPREHENSIVE METABOLIC PANEL
ALBUMIN: 4.2 g/dL (ref 3.5–5.2)
ALK PHOS: 63 U/L (ref 39–117)
ALT: 22 U/L (ref 0–53)
AST: 16 U/L (ref 0–37)
BILIRUBIN TOTAL: 0.6 mg/dL (ref 0.2–1.2)
BUN: 22 mg/dL (ref 6–23)
CO2: 26 mEq/L (ref 19–32)
Calcium: 9.6 mg/dL (ref 8.4–10.5)
Chloride: 102 mEq/L (ref 96–112)
Creatinine, Ser: 1.01 mg/dL (ref 0.40–1.50)
GFR: 77.91 mL/min (ref >60.00)
Glucose, Bld: 167 mg/dL — ABNORMAL HIGH (ref 70–99)
POTASSIUM: 4.3 meq/L (ref 3.5–5.1)
Sodium: 137 mEq/L (ref 135–145)
TOTAL PROTEIN: 6.4 g/dL (ref 6.0–8.3)

## 2017-10-11 LAB — CBC
HCT: 42 % (ref 39.0–52.0)
HEMOGLOBIN: 14.2 g/dL (ref 13.0–17.0)
MCHC: 33.9 g/dL (ref 30.0–36.0)
MCV: 93.6 fl (ref 78.0–100.0)
PLATELETS: 265 10*3/uL (ref 150.0–400.0)
RBC: 4.49 Mil/uL (ref 4.22–5.81)
RDW: 12.8 % (ref 11.5–15.5)
WBC: 4.5 10*3/uL (ref 4.0–10.5)

## 2017-10-11 LAB — HEMOGLOBIN A1C: Hgb A1c MFr Bld: 7.9 % — ABNORMAL HIGH (ref 4.6–6.5)

## 2017-10-11 LAB — PSA: PSA: 2.14 ng/mL (ref 0.10–4.00)

## 2017-10-11 MED ORDER — ZOLPIDEM TARTRATE 5 MG PO TABS: ORAL_TABLET | ORAL | 2 refills | Status: DC

## 2017-10-11 MED ORDER — CLOPIDOGREL BISULFATE 75 MG PO TABS: 75.0000 mg | ORAL_TABLET | Freq: Every day | ORAL | 3 refills | Status: DC

## 2017-10-11 MED ORDER — METFORMIN HCL ER 500 MG PO TB24: 1000.0000 mg | ORAL_TABLET | Freq: Two times a day (BID) | ORAL | 3 refills | Status: DC

## 2017-10-11 MED ORDER — LOSARTAN POTASSIUM 100 MG PO TABS: 100.0000 mg | ORAL_TABLET | Freq: Every day | ORAL | 3 refills | Status: DC

## 2017-10-11 MED FILL — METFORMIN HCL ER 500 MG TAB: 500 | 90 days supply | Qty: 360 | Fill #0

## 2017-10-11 MED FILL — ZOLPIDEM TARTRATE 5 MG TAB: 5 | 30 days supply | Qty: 30 | Fill #0

## 2017-10-11 MED FILL — LOSARTAN POTASSIUM 100 MG T: 100 | 90 days supply | Qty: 90 | Fill #0

## 2017-10-11 MED FILL — CLOPIDOGREL 75 MG TABLET: 75 | 90 days supply | Qty: 90 | Fill #0

## 2017-10-11 NOTE — Patient Instructions (Signed)
Always a pleasure to see you! I'll be in touch with your labs asap   Health Maintenance, Male A healthy lifestyle and preventive care is important for your health and wellness. Ask your health care provider about what schedule of regular examinations is right for you. What should I know about weight and diet? Eat a Healthy Diet  Eat plenty of vegetables, fruits, whole grains, low-fat dairy products, and lean protein.  Do not eat a lot of foods high in solid fats, added sugars, or salt.  Maintain a Healthy Weight Regular exercise can help you achieve or maintain a healthy weight. You should:  Do at least 150 minutes of exercise each week. The exercise should increase your heart rate and make you sweat (moderate-intensity exercise).  Do strength-training exercises at least twice a week.  Watch Your Levels of Cholesterol and Blood Lipids  Have your blood tested for lipids and cholesterol every 5 years starting at 69 years of age. If you are at high risk for heart disease, you should start having your blood tested when you are 69 years old. You may need to have your cholesterol levels checked more often if: ? Your lipid or cholesterol levels are high. ? You are older than 69 years of age. ? You are at high risk for heart disease.  What should I know about cancer screening? Many types of cancers can be detected early and may often be prevented. Lung Cancer  You should be screened every year for lung cancer if: ? You are a current smoker who has smoked for at least 30 years. ? You are a former smoker who has quit within the past 15 years.  Talk to your health care provider about your screening options, when you should start screening, and how often you should be screened.  Colorectal Cancer  Routine colorectal cancer screening usually begins at 69 years of age and should be repeated every 5-10 years until you are 69 years old. You may need to be screened more often if early forms of  precancerous polyps or small growths are found. Your health care provider may recommend screening at an earlier age if you have risk factors for colon cancer.  Your health care provider may recommend using home test kits to check for hidden blood in the stool.  A small camera at the end of a tube can be used to examine your colon (sigmoidoscopy or colonoscopy). This checks for the earliest forms of colorectal cancer.  Prostate and Testicular Cancer  Depending on your age and overall health, your health care provider may do certain tests to screen for prostate and testicular cancer.  Talk to your health care provider about any symptoms or concerns you have about testicular or prostate cancer.  Skin Cancer  Check your skin from head to toe regularly.  Tell your health care provider about any new moles or changes in moles, especially if: ? There is a change in a mole's size, shape, or color. ? You have a mole that is larger than a pencil eraser.  Always use sunscreen. Apply sunscreen liberally and repeat throughout the day.  Protect yourself by wearing long sleeves, pants, a wide-brimmed hat, and sunglasses when outside.  What should I know about heart disease, diabetes, and high blood pressure?  If you are 18-25 years of age, have your blood pressure checked every 3-5 years. If you are 49 years of age or older, have your blood pressure checked every year. You should have  your blood pressure measured twice-once when you are at a hospital or clinic, and once when you are not at a hospital or clinic. Record the average of the two measurements. To check your blood pressure when you are not at a hospital or clinic, you can use: ? An automated blood pressure machine at a pharmacy. ? A home blood pressure monitor.  Talk to your health care provider about your target blood pressure.  If you are between 45-79 years old, ask your health care provider if you should take aspirin to prevent heart  disease.  Have regular diabetes screenings by checking your fasting blood sugar level. ? If you are at a normal weight and have a low risk for diabetes, have this test once every three years after the age of 45. ? If you are overweight and have a high risk for diabetes, consider being tested at a younger age or more often.  A one-time screening for abdominal aortic aneurysm (AAA) by ultrasound is recommended for men aged 65-75 years who are current or former smokers. What should I know about preventing infection? Hepatitis B If you have a higher risk for hepatitis B, you should be screened for this virus. Talk with your health care provider to find out if you are at risk for hepatitis B infection. Hepatitis C Blood testing is recommended for:  Everyone born from 1945 through 1965.  Anyone with known risk factors for hepatitis C.  Sexually Transmitted Diseases (STDs)  You should be screened each year for STDs including gonorrhea and chlamydia if: ? You are sexually active and are younger than 69 years of age. ? You are older than 69 years of age and your health care provider tells you that you are at risk for this type of infection. ? Your sexual activity has changed since you were last screened and you are at an increased risk for chlamydia or gonorrhea. Ask your health care provider if you are at risk.  Talk with your health care provider about whether you are at high risk of being infected with HIV. Your health care provider may recommend a prescription medicine to help prevent HIV infection.  What else can I do?  Schedule regular health, dental, and eye exams.  Stay current with your vaccines (immunizations).  Do not use any tobacco products, such as cigarettes, chewing tobacco, and e-cigarettes. If you need help quitting, ask your health care provider.  Limit alcohol intake to no more than 2 drinks per day. One drink equals 12 ounces of beer, 5 ounces of wine, or 1 ounces of  hard liquor.  Do not use street drugs.  Do not share needles.  Ask your health care provider for help if you need support or information about quitting drugs.  Tell your health care provider if you often feel depressed.  Tell your health care provider if you have ever been abused or do not feel safe at home. This information is not intended to replace advice given to you by your health care provider. Make sure you discuss any questions you have with your health care provider. Document Released: 10/15/2007 Document Revised: 12/16/2015 Document Reviewed: 01/20/2015 Elsevier Interactive Patient Education  2018 [employer]  

## 2017-10-12 ENCOUNTER — Encounter: Payer: Self-pay | Admitting: Family Medicine

## 2017-10-31 ENCOUNTER — Other Ambulatory Visit: Payer: Self-pay | Admitting: Cardiology

## 2017-10-31 DIAGNOSIS — I2510 Atherosclerotic heart disease of native coronary artery without angina pectoris: Secondary | ICD-10-CM

## 2017-11-01 DIAGNOSIS — M25552 Pain in left hip: Secondary | ICD-10-CM | POA: Diagnosis not present

## 2017-11-01 DIAGNOSIS — M25531 Pain in right wrist: Secondary | ICD-10-CM | POA: Diagnosis not present

## 2017-11-01 DIAGNOSIS — M25562 Pain in left knee: Secondary | ICD-10-CM | POA: Diagnosis not present

## 2017-11-01 MED FILL — ALLOPURINOL 300 MG TABS: 300 | 90 days supply | Qty: 90 | Fill #1

## 2017-11-01 MED FILL — BYSTOLIC 10 MG TABLET: 10 | 90 days supply | Qty: 90 | Fill #1

## 2017-11-01 MED FILL — INVOKANA 300 MG TABLET: 300 | 90 days supply | Qty: 90 | Fill #2

## 2017-11-13 ENCOUNTER — Other Ambulatory Visit: Payer: Self-pay | Admitting: Family Medicine

## 2017-11-13 MED FILL — BYDUREON 2 MG PEN INJECT: 2 | 84 days supply | Qty: 12 | Fill #0

## 2017-11-18 ENCOUNTER — Encounter: Payer: Self-pay | Admitting: Family Medicine

## 2017-11-20 DIAGNOSIS — M79644 Pain in right finger(s): Secondary | ICD-10-CM | POA: Diagnosis not present

## 2017-11-20 DIAGNOSIS — M1832 Unilateral post-traumatic osteoarthritis of first carpometacarpal joint, left hand: Secondary | ICD-10-CM | POA: Diagnosis not present

## 2017-11-20 MED FILL — ROSUVASTATIN CALCIUM 10 MG: 10 | 84 days supply | Qty: 36 | Fill #1

## 2017-11-20 MED FILL — TAMSULOSIN HCL 0.4 MG CAP: 0.4 | 90 days supply | Qty: 90 | Fill #1

## 2017-11-24 ENCOUNTER — Ambulatory Visit (HOSPITAL_COMMUNITY)
Admission: RE | Admit: 2017-11-24 | Discharge: 2017-11-24 | Disposition: A | Discharge status: Home / Self Care | Payer: 59 | Source: Ambulatory Visit | Attending: Cardiology | Admitting: Cardiology

## 2017-11-24 DIAGNOSIS — I2510 Atherosclerotic heart disease of native coronary artery without angina pectoris: Secondary | ICD-10-CM | POA: Insufficient documentation

## 2017-11-24 DIAGNOSIS — I77810 Thoracic aortic ectasia: Secondary | ICD-10-CM | POA: Insufficient documentation

## 2017-11-24 DIAGNOSIS — R079 Chest pain, unspecified: Secondary | ICD-10-CM | POA: Diagnosis not present

## 2017-11-24 LAB — POCT I-STAT CREATININE: CREATININE: 0.9 mg/dL (ref 0.61–1.24)

## 2017-11-24 MED ORDER — METOPROLOL TARTRATE 5 MG/5ML IV SOLN
5.0000 mg | INTRAVENOUS | Status: DC | PRN
  Administered 2017-11-24 (×4): 5 mg via INTRAVENOUS
  Filled 2017-11-24 (×4): qty 5

## 2017-11-24 MED ORDER — NITROGLYCERIN 0.4 MG SL SUBL
0.8000 mg | SUBLINGUAL_TABLET | Freq: Once | SUBLINGUAL | Status: AC
  Administered 2017-11-24: 0.8 mg via SUBLINGUAL
  Filled 2017-11-24: qty 25

## 2017-11-24 MED ORDER — IOPAMIDOL (ISOVUE-370) INJECTION 76%
100.0000 mL | Freq: Once | INTRAVENOUS | Status: AC | PRN
  Administered 2017-11-24: 80 mL via INTRAVENOUS

## 2017-11-24 MED ORDER — METOPROLOL TARTRATE 5 MG/5ML IV SOLN
INTRAVENOUS | Status: AC
  Filled 2017-11-24: qty 5

## 2017-11-24 MED ORDER — NITROGLYCERIN 0.4 MG SL SUBL
SUBLINGUAL_TABLET | SUBLINGUAL | Status: AC
  Filled 2017-11-24: qty 2

## 2017-11-24 MED ORDER — IOPAMIDOL (ISOVUE-370) INJECTION 76%
INTRAVENOUS | Status: AC
  Filled 2017-11-24: qty 100

## 2017-11-24 MED ORDER — METOPROLOL TARTRATE 5 MG/5ML IV SOLN
INTRAVENOUS | Status: AC
  Filled 2017-11-24: qty 15

## 2017-11-24 NOTE — Progress Notes (Signed)
CT scan completed. Tolerated well. D/C home walking with wife, awake and alert. In no distress.

## 2017-12-04 DIAGNOSIS — E785 Hyperlipidemia, unspecified: Secondary | ICD-10-CM | POA: Diagnosis not present

## 2017-12-04 DIAGNOSIS — E119 Type 2 diabetes mellitus without complications: Secondary | ICD-10-CM | POA: Diagnosis not present

## 2017-12-04 DIAGNOSIS — I2510 Atherosclerotic heart disease of native coronary artery without angina pectoris: Secondary | ICD-10-CM | POA: Diagnosis not present

## 2017-12-04 DIAGNOSIS — R9431 Abnormal electrocardiogram [ECG] [EKG]: Secondary | ICD-10-CM | POA: Diagnosis not present

## 2017-12-04 DIAGNOSIS — I472 Ventricular tachycardia: Secondary | ICD-10-CM | POA: Diagnosis not present

## 2017-12-04 DIAGNOSIS — I10 Essential (primary) hypertension: Secondary | ICD-10-CM | POA: Diagnosis not present

## 2017-12-04 DIAGNOSIS — E668 Other obesity: Secondary | ICD-10-CM | POA: Diagnosis not present

## 2017-12-04 DIAGNOSIS — Z01810 Encounter for preprocedural cardiovascular examination: Secondary | ICD-10-CM | POA: Diagnosis not present

## 2017-12-04 NOTE — H&P (Signed)
Phillip [name]    Date of visit:  12/04/2017 DOB:  08-21-1950    Age:  67 yrs. Medical record number:  [id number]     Account number:  [id number] Primary Care Provider: COPLAND,JESSICA ____________________________ CURRENT DIAGNOSES  1. CAD Native without angina  2. Obesity  3. Hyperlipidemia  4. Essential (primary) hypertension  5. Type 2 diabetes mellitus without complications  6. Ventricular tachycardia  7. Abnormal electrocardiogram [ECG]  8. Encounter for preprocedural cardiovascular examination ____________________________ ALLERGIES  Sulfa (Sulfonamides), Intolerance-unknown ____________________________ MEDICATIONS  1. allopurinol 300 mg tablet, 1 p.o. daily  2. Ambien 5 mg tablet, PRN  3. Bydureon 2 mg/0.65 mL subcutaneous pen injector, weekly  4. Bystolic 10 mg tablet, 1 p.o. daily  5. clopidogrel 75 mg tablet, 1 p.o. daily  6. Crestor 10 mg tablet, MWF  7. Invokana 300 mg tablet, 1 p.o. daily  8. losartan 100 mg tablet, 1 p.o. daily  9. metformin 1,000 mg tablet, BID  10. Skelaxin 800 mg tablet, PRN  11. 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 He was evaluated by me in 2009 and had a Cardiolite that showed some nonsustained ventricular tachycardia at peak exercise. He eventually underwent cardiac catheterization that showed mild proximal LAD disease and mild disease involving the right coronary artery. He was placed on treatment with metoprolol and had repeat exercise testing showing improvement in his ventricular tachycardia. He has essentially remained asymptomatic over the years. He was at one point switched to a systolic and noted improvement in some T. He is diabetic as well as hypertensive and has hyperlipidemia. On a recent examination he was found to have some T-wave inversions in the lateral leads which was a new issue. Because he was diabetic and  might have had some mild dyspnea he underwent another myocardial perfusion scan that did not show ischemia but again had some runs of ventricular tachycardia nonsustained with exercise. He is active and normally plays golf without symptoms and denies angina. He underwent a cardiac CTA recently because of the abnormal study and this showed a 50% diagonal stenosis and obtuse marginal stenosis with an abnormal FFR of the diagonal branch. Calcium score was around 500. There was mild disease of the right coronary artery and some moderate disease in the LAD. Because of this catheterization has been recommended. There've been in formal discussions with the EP and felt that we should pursue potential causes for ischemia in light of the abnormal arrhythmia at peak exercise. ____________________________ PAST HISTORY  Past Medical Illnesses:  hypertension, DM-non-insulin dependent, obesity, history of migraine headaches, E. Coli sepsis 1998 with acute cholecystitis, sleep apnea, gout, prior CVA 12/14;  Cardiovascular Illnesses:  RBBB, CAD;  Surgical Procedures:  cholecystectomy (lap), knee surgery, left, tonsillectomy, vasectomy, hip surgery;  NYHA Classification:  I;  Canadian Angina Classification:  Class 0: Asymptomatic;  Cardiology Procedures-Invasive:  cardiac cath (left), loop monitor implant 2014;  Cardiology Procedures-Noninvasive:  stress echocardiogram, treadmill cardiolite May 2009, treadmill January 2010, treadmill August 2013, event monitor June 2014, echocardiogram July 2014, treadmill Myoview May 2019;  Cardiac Cath Results:  normal Left main, ecctatic proximal LAD, 20 % stenosis proximal LAD, normal CFX, scattered irregularities RCA;  LVEF of 64% documented via nuclear study on 09/07/2017,   ____________________________ CARDIO-PULMONARY TEST DATES EKG Date:  12/04/2017;   Cardiac Cath Date:  09/30/2007;  Holter/Event Monitor Date: 10/15/2012;  Nuclear Study Date:  09/07/2017;  Echocardiography Date:  10/31/2012;  Chest Xray Date: 09/20/2007;   ____________________________ SOCIAL HISTORY Alcohol Use:  no alcohol use;  Smoking:  never smoked;  Diet:  regular diet;  Lifestyle:  married and 4 children;  Exercise:  walking for approximately 45 minutes 6 days per week;  Occupation:  physician;  Residence:  lives with wife;   ____________________________ REVIEW OF SYSTEMS General:  no change in weight  Integumentary:eczema Eyes: wears eye glasses/contact lenses Ears, Nose, Throat, Mouth:  denies any hearing loss, epistaxis, hoarseness or difficulty speaking. Respiratory: denies dyspnea, cough, wheezing or hemoptysis. Cardiovascular:  please review HPI Abdominal: denies dyspepsia, GI bleeding, constipation, or diarrhea Genitourinary-Male: hesitancy, nocturia  Musculoskeletal:  occasional edema of left leg Neurological:  peripheral neuropathy  ____________________________ PHYSICAL EXAMINATION VITAL SIGNS  Blood Pressure:  136/70 Sitting, Right arm, regular cuff  , 130/80 Standing, Right arm and regular cuff   Pulse:  80/min. Weight:  228.00 lbs. Height:  73.00"BMI: 30  Constitutional:  pleasant white male in no acute distress, mildly obese Skin:  warm and dry to touch, no apparent skin lesions, or masses noted. Head:  normocephalic, normal hair pattern, no masses or tenderness Eyes:  EOMS Intact, PERRLA, C and S clear, Funduscopic exam not done. ENT:  ears, nose and throat reveal no gross abnormalities.  Dentition good. Neck:  supple, without massess. No JVD, thyromegaly or carotid bruits. Carotid upstroke normal. Chest:  normal symmetry, clear to auscultation. Cardiac:  regular rhythm, normal S1 and S2, No S3 or S4, no murmurs, gallops or rubs detected. Abdomen:  abdomen soft,non-tender, no masses, no hepatospenomegaly, or aneurysm noted Peripheral Pulses:  the femoral,dorsalis pedis, and posterior tibial pulses are full and equal bilaterally with no bruits auscultated. Extremities & Back:  no  deformities, clubbing, cyanosis, erythema or edema observed. Normal muscle strength and tone. Neurological:  no gross motor or sensory deficits noted, affect appropriate, oriented x3. ____________________________ MOST RECENT LIPID PANEL 05/15/17  CHOL TOTL 146 mg/dl, LDL 77 NM, HDL 45.6 mg/dl and TRIGLYCER 202 mg/dl ____________________________ IMPRESSIONS/PLAN 1. Exercise induced nonsustained ventricular tachycardia 2. History of coronary artery disease 3. Non-insulin-dependent diabetes 4. Hypertension controlled 5. Hyperlipidemia under treatment  Recommendations:  Have arranged for the patient to go same day outpatient catheterization. He does have an abnormal coronary CTA with a 50% diagonal and abnormal FFR. Will discuss with the interventional cardiologist at that time. Discussed potentially going by the radial approach. Cardiac catheterization was discussed with the patient including risks of myocardial infarction, death, stroke, bleeding, arrhythmia, dye allergy, or renal insufficiency. He understands and is willing to proceed. Possibility of percutaneous intervention at the same setting was also discussed with the patient including risks. Options would include intensify medical therapy, formal EP consultation or if something in the diagonal branch was found it was chiefly dressed with stenting potentially addressing this area. ____________________________ TODAYS ORDERS  1. Comprehensive Metabolic Panel: today  2. Complete Blood Count: Today  3. 12 Lead EKG: Today                       ____________________________ Cardiology Physician:  Kerry Hough MD [facility]

## 2017-12-07 ENCOUNTER — Other Ambulatory Visit: Payer: Self-pay

## 2017-12-07 ENCOUNTER — Encounter (HOSPITAL_COMMUNITY)
Admission: RE | Disposition: A | Discharge status: Home / Self Care | Payer: Self-pay | Source: Ambulatory Visit | Attending: Cardiovascular Disease

## 2017-12-07 ENCOUNTER — Encounter (HOSPITAL_COMMUNITY): Payer: Self-pay | Admitting: Cardiovascular Disease

## 2017-12-07 ENCOUNTER — Ambulatory Visit (HOSPITAL_COMMUNITY)
Admission: RE | Admit: 2017-12-07 | Discharge: 2017-12-07 | Disposition: A | Discharge status: Home / Self Care | Payer: 59 | Source: Ambulatory Visit | Attending: Cardiovascular Disease | Admitting: Cardiovascular Disease

## 2017-12-07 DIAGNOSIS — M109 Gout, unspecified: Secondary | ICD-10-CM | POA: Diagnosis not present

## 2017-12-07 DIAGNOSIS — R9431 Abnormal electrocardiogram [ECG] [EKG]: Secondary | ICD-10-CM | POA: Insufficient documentation

## 2017-12-07 DIAGNOSIS — I2510 Atherosclerotic heart disease of native coronary artery without angina pectoris: Secondary | ICD-10-CM | POA: Diagnosis present

## 2017-12-07 DIAGNOSIS — I472 Ventricular tachycardia, unspecified: Secondary | ICD-10-CM

## 2017-12-07 DIAGNOSIS — E119 Type 2 diabetes mellitus without complications: Secondary | ICD-10-CM | POA: Insufficient documentation

## 2017-12-07 DIAGNOSIS — Z7984 Long term (current) use of oral hypoglycemic drugs: Secondary | ICD-10-CM | POA: Diagnosis not present

## 2017-12-07 DIAGNOSIS — Z882 Allergy status to sulfonamides status: Secondary | ICD-10-CM | POA: Diagnosis not present

## 2017-12-07 DIAGNOSIS — G43909 Migraine, unspecified, not intractable, without status migrainosus: Secondary | ICD-10-CM | POA: Insufficient documentation

## 2017-12-07 DIAGNOSIS — E669 Obesity, unspecified: Secondary | ICD-10-CM | POA: Insufficient documentation

## 2017-12-07 DIAGNOSIS — G4730 Sleep apnea, unspecified: Secondary | ICD-10-CM | POA: Insufficient documentation

## 2017-12-07 DIAGNOSIS — I10 Essential (primary) hypertension: Secondary | ICD-10-CM | POA: Insufficient documentation

## 2017-12-07 DIAGNOSIS — Z79899 Other long term (current) drug therapy: Secondary | ICD-10-CM | POA: Insufficient documentation

## 2017-12-07 DIAGNOSIS — E785 Hyperlipidemia, unspecified: Secondary | ICD-10-CM | POA: Diagnosis not present

## 2017-12-07 DIAGNOSIS — E1140 Type 2 diabetes mellitus with diabetic neuropathy, unspecified: Secondary | ICD-10-CM

## 2017-12-07 DIAGNOSIS — Z8673 Personal history of transient ischemic attack (TIA), and cerebral infarction without residual deficits: Secondary | ICD-10-CM | POA: Diagnosis not present

## 2017-12-07 DIAGNOSIS — Z6830 Body mass index (BMI) 30.0-30.9, adult: Secondary | ICD-10-CM | POA: Diagnosis not present

## 2017-12-07 DIAGNOSIS — Z9049 Acquired absence of other specified parts of digestive tract: Secondary | ICD-10-CM | POA: Insufficient documentation

## 2017-12-07 HISTORY — PX: LEFT HEART CATH AND CORONARY ANGIOGRAPHY: CATH118249

## 2017-12-07 LAB — GLUCOSE, CAPILLARY: Glucose-Capillary: 163 mg/dL — ABNORMAL HIGH (ref 70–99)

## 2017-12-07 LAB — CARDIAC CATHETERIZATION: Cath EF Quantitative: 55 %

## 2017-12-07 SURGERY — LEFT HEART CATH AND CORONARY ANGIOGRAPHY
Anesthesia: LOCAL

## 2017-12-07 MED ORDER — SODIUM CHLORIDE 0.9% FLUSH: 3.0000 mL | Freq: Two times a day (BID) | INTRAVENOUS | Status: DC

## 2017-12-07 MED ORDER — ASPIRIN 81 MG PO CHEW
CHEWABLE_TABLET | ORAL | Status: AC
  Administered 2017-12-07: 81 mg via ORAL
  Filled 2017-12-07: qty 1

## 2017-12-07 MED ORDER — SODIUM CHLORIDE 0.9 % WEIGHT BASED INFUSION
3.0000 mL/kg/h | INTRAVENOUS | Status: AC
  Administered 2017-12-07: 3 mL/kg/h via INTRAVENOUS

## 2017-12-07 MED ORDER — HEPARIN SODIUM (PORCINE) 1000 UNIT/ML IJ SOLN
INTRAMUSCULAR | Status: DC | PRN
  Administered 2017-12-07: 5000 [IU] via INTRAVENOUS

## 2017-12-07 MED ORDER — HEPARIN (PORCINE) IN NACL 1000-0.9 UT/500ML-% IV SOLN
INTRAVENOUS | Status: DC | PRN
  Administered 2017-12-07 (×2): 500 mL

## 2017-12-07 MED ORDER — HEPARIN SODIUM (PORCINE) 1000 UNIT/ML IJ SOLN
INTRAMUSCULAR | Status: AC
  Filled 2017-12-07: qty 1

## 2017-12-07 MED ORDER — SODIUM CHLORIDE 0.9% FLUSH: 3.0000 mL | INTRAVENOUS | Status: DC | PRN

## 2017-12-07 MED ORDER — SODIUM CHLORIDE 0.9 % WEIGHT BASED INFUSION: 1.0000 mL/kg/h | INTRAVENOUS | Status: DC

## 2017-12-07 MED ORDER — HEPARIN (PORCINE) IN NACL 1000-0.9 UT/500ML-% IV SOLN
INTRAVENOUS | Status: AC
  Filled 2017-12-07: qty 1000

## 2017-12-07 MED ORDER — VERAPAMIL HCL 2.5 MG/ML IV SOLN
INTRAVENOUS | Status: DC | PRN
  Administered 2017-12-07: 10 mL via INTRA_ARTERIAL

## 2017-12-07 MED ORDER — SODIUM CHLORIDE 0.9 % IV SOLN: 250.0000 mL | INTRAVENOUS | Status: DC | PRN

## 2017-12-07 MED ORDER — ASPIRIN 81 MG PO CHEW
81.0000 mg | CHEWABLE_TABLET | ORAL | Status: AC
  Administered 2017-12-07: 81 mg via ORAL

## 2017-12-07 MED ORDER — LIDOCAINE HCL (PF) 1 % IJ SOLN
INTRAMUSCULAR | Status: AC
  Filled 2017-12-07: qty 30

## 2017-12-07 MED ORDER — ONDANSETRON HCL 4 MG/2ML IJ SOLN: 4.0000 mg | Freq: Four times a day (QID) | INTRAMUSCULAR | Status: DC | PRN

## 2017-12-07 MED ORDER — VERAPAMIL HCL 2.5 MG/ML IV SOLN
INTRAVENOUS | Status: AC
  Filled 2017-12-07: qty 2

## 2017-12-07 MED ORDER — LIDOCAINE HCL (PF) 1 % IJ SOLN
INTRAMUSCULAR | Status: DC | PRN
  Administered 2017-12-07: 2 mL via INTRADERMAL

## 2017-12-07 MED ORDER — IOHEXOL 350 MG/ML SOLN
INTRAVENOUS | Status: DC | PRN
  Administered 2017-12-07: 90 mL via INTRAVENOUS

## 2017-12-07 MED ORDER — SODIUM CHLORIDE 0.9 % IV SOLN: INTRAVENOUS | Status: DC

## 2017-12-07 MED ORDER — ACETAMINOPHEN 325 MG PO TABS: 650.0000 mg | ORAL_TABLET | ORAL | Status: DC | PRN

## 2017-12-07 SURGICAL SUPPLY — 11 items

## 2017-12-07 NOTE — Discharge Instructions (Signed)

## 2018-01-09 MED FILL — METFORMIN HCL ER 500 MG TAB: 500 | 90 days supply | Qty: 360 | Fill #1

## 2018-01-09 MED FILL — CLOPIDOGREL 75 MG TABLET: 75 | 90 days supply | Qty: 90 | Fill #1

## 2018-01-09 MED FILL — LOSARTAN POTASSIUM 100 MG T: 100 | 90 days supply | Qty: 90 | Fill #1

## 2018-01-25 ENCOUNTER — Other Ambulatory Visit: Payer: Self-pay | Admitting: Family Medicine

## 2018-01-25 ENCOUNTER — Encounter: Payer: Self-pay | Admitting: Family Medicine

## 2018-01-25 DIAGNOSIS — E119 Type 2 diabetes mellitus without complications: Secondary | ICD-10-CM

## 2018-01-25 MED FILL — ALLOPURINOL 300 MG TAB: 300 | 90 days supply | Qty: 90 | Fill #2

## 2018-01-25 MED FILL — ZOLPIDEM TARTRATE 5 MG TAB: 5 | 30 days supply | Qty: 30 | Fill #1

## 2018-01-25 MED FILL — BYSTOLIC 10 MG TABLET: 10 | 90 days supply | Qty: 90 | Fill #2

## 2018-01-25 MED FILL — INVOKANA 300 MG TABLET: 300 | 90 days supply | Qty: 90 | Fill #0

## 2018-01-26 MED ORDER — EXENATIDE ER 2 MG/0.85ML ~~LOC~~ AUIJ: 2.0000 mg | AUTO-INJECTOR | SUBCUTANEOUS | 3 refills | Status: DC

## 2018-01-26 MED ORDER — LINAGLIPTIN 5 MG PO TABS: 5.0000 mg | ORAL_TABLET | Freq: Every day | ORAL | 11 refills | Status: DC

## 2018-01-26 NOTE — Telephone Encounter (Signed)
Please see note from pharmacy

## 2018-01-28 MED FILL — ROSUVASTATIN CALCIUM 10 MG: 10 | 84 days supply | Qty: 36 | Fill #2

## 2018-01-30 DIAGNOSIS — E119 Type 2 diabetes mellitus without complications: Secondary | ICD-10-CM | POA: Diagnosis not present

## 2018-02-11 ENCOUNTER — Encounter: Payer: Self-pay | Admitting: Family Medicine

## 2018-02-11 DIAGNOSIS — Z298 Encounter for other specified prophylactic measures: Secondary | ICD-10-CM

## 2018-02-12 MED ORDER — DOXYCYCLINE HYCLATE 100 MG PO CAPS: 100.0000 mg | ORAL_CAPSULE | Freq: Every day | ORAL | 0 refills | Status: DC

## 2018-02-12 MED FILL — DOXYCYCLINE HYCLATE 100 MG: 100 | 60 days supply | Qty: 60 | Fill #0

## 2018-02-13 MED FILL — TAMSULOSIN HCL 0.4 MG CAP: 0.4 | 90 days supply | Qty: 90 | Fill #2

## 2018-03-23 ENCOUNTER — Encounter: Payer: Self-pay | Admitting: Neurology

## 2018-03-24 MED FILL — TRADJENTA 5 MG TABLET: 5 | 30 days supply | Qty: 30 | Fill #1

## 2018-03-26 ENCOUNTER — Other Ambulatory Visit: Payer: Self-pay | Admitting: Neurology

## 2018-03-26 DIAGNOSIS — Z9989 Dependence on other enabling machines and devices: Principal | ICD-10-CM

## 2018-03-26 DIAGNOSIS — G4733 Obstructive sleep apnea (adult) (pediatric): Secondary | ICD-10-CM

## 2018-04-07 MED FILL — CLOPIDOGREL 75 MG TABLET: 75 | 90 days supply | Qty: 90 | Fill #2

## 2018-04-08 NOTE — Progress Notes (Addendum)
[facility] at [facility] [address] [place], [place] [address] [phone]  Date:  04/12/2018   Name:  Phillip [name]   DOB:  1951-03-27   MRN:  [id number]  PCP:  Darreld Mclean, MD    Chief Complaint: Diabetes (no concerns)   History of Present Illness:  Phillip [name] is a 67 y.o. very pleasant male patient who presents with the following:  Periodic follow-up visit today History of CAD, migraine, CVA 2014, hyperlipidemia, DM Since our last visit he underwent a heart cath in August of this year per [facility] in response to a stress test abnl.  His cardiac cath was basically normal as below no intervention was needed.  There is evidence for  mild coronary calcification with 30 to 40% narrowing in the proximal LAD followed by an area of ectasia immediately proximal to the first diagonal vessel.  The first diagonal vessel had a irregularity with narrowing of 30%.  The LAD beyond the ectatic segment was a much smaller caliber vessel extending to the apex compared to the proximal segment and was without focal obstructive stenoses. The left circumflex vessel has mild 40% stenosis in the first marginal branch. The RCA is a very large dominant vessel which gives rise to a large PDA and PLA system and is angiographically normal. RECOMMENDATION: Medical therapy will be recommended with continued beta-blocker therapy. Consider increasing statin dose for aggressive lipid intervention to potentially induce plaque regression.  Dr. Wynonia Lawman was notified of the procedure.  Further recommendations per Dr. Wynonia Lawman regarding consideration for EP evaluation. Recommend continued antiplatelet therapy with Plavix which was previously started following the patient's small occipital stroke.   Lab Results  Component Value Date   HGBA1C 7.9 (H) 10/11/2017   Eye exam: [place], done in September Shingrix: does not want to do today, he is still thinking about  this  Fasting today for labs   He was recently in [place]/ [place] for a month.  He had a wonderful trip, and lost weight while abroad.  However the weight has come back since he returned home. He is taking crestor 10 3-4 times a week.  He is tolerating this dose well. He would like to check a B12 for peripheral neuropathy- there is new info linking long term metformin to B12 deficiency. He has noted peripheral neuropathy in both feet for about 15 years.  However, this is not painful.  It is more just a tingling sensation.  He does not wish to use any medication for this at this time.  Wt Readings from Last 3 Encounters:  04/12/18 227 lb 6.4 oz (103.1 kg)  12/07/17 225 lb (102.1 kg)  10/11/17 228 lb 6.4 oz (103.6 kg)     Patient Active Problem List   Diagnosis Date Noted  . OSA on CPAP 08/12/2015  . Ventricular tachycardia ([place]) 11/10/2014  . CAD (coronary artery disease), native coronary artery   . Variants of migraine, not elsewhere classified, without mention of intractable migraine without mention of status migrainosus 08/14/2013  . History of ischemic stroke without residual deficits 04/17/2013  . ED (erectile dysfunction) 02/01/2012  . Gout 09/05/2011  . Hyperlipidemia 09/05/2011  . Hypertriglyceridemia 09/05/2011  . PVC (premature ventricular contraction) 09/05/2011  . Type 2 diabetes, controlled, with neuropathy ([place]) 06/01/2011  . Essential hypertension, benign 06/01/2011    Past Medical History:  Diagnosis Date  . Anemia   . Arthritis   . Diabetes mellitus   .  Gout   . Hyperlipidemia   . Hypertension   . Localized osteoarthritis of left knee 11/20/2012  . Migraine headache with aura   . Neuromuscular disorder ([place])    " mild periferal neauropathy  . Osteoarthritis of left hip 11/20/2012  . PVC (premature ventricular contraction)   . Sleep apnea    cpap    Past Surgical History:  Procedure Laterality Date  . CHOLECYSTECTOMY  1997  . CVA     CVA, occiptal  .  CVA     CVA, occipital  . knee arthoscopy  2006   Left Knee  . LEFT HEART CATH AND CORONARY ANGIOGRAPHY N/A 12/07/2017   Procedure: LEFT HEART CATH AND CORONARY ANGIOGRAPHY;  Surgeon: Troy Sine, MD;  Location: [facility];  Service: Cardiovascular;  Laterality: N/A;  . left total hip    . left unicarpmental knee    . LOOP RECORDER IMPLANT N/A 04/19/2013   Procedure: LOOP RECORDER IMPLANT;  Surgeon: Evans Lance, MD;  Location: [facility] CATH LAB;  Service: Cardiovascular;  Laterality: N/A;  . PARTIAL KNEE ARTHROPLASTY Left 11/20/2012   Procedure: UNICOMPARTMENTAL KNEE;  Surgeon: Johnny Bridge, MD;  Location: [facility];  Service: Orthopedics;  Laterality: Left;  . REPLACEMENT UNICONDYLAR JOINT KNEE Left 11/20/2012   Dr Mardelle Matte  . TEE WITHOUT CARDIOVERSION N/A 04/19/2013   Procedure: TRANSESOPHAGEAL ECHOCARDIOGRAM (TEE);  Surgeon: Larey Dresser, MD;  Location: [facility];  Service: Cardiovascular;  Laterality: N/A;  . TONSILLECTOMY AND ADENOIDECTOMY     Childhood  . TOTAL HIP ARTHROPLASTY Left 11/20/2012   Dr Mardelle Matte  . TOTAL HIP ARTHROPLASTY Left 11/20/2012   Procedure: TOTAL HIP ARTHROPLASTY;  Surgeon: Johnny Bridge, MD;  Location: [facility];  Service: Orthopedics;  Laterality: Left;    Social History   Tobacco Use  . Smoking status: Never Smoker  . Smokeless tobacco: Never Used  Substance Use Topics  . Alcohol use: No    Alcohol/week: 0.0 standard drinks  . Drug use: No    Family History  Problem Relation Age of Onset  . Cancer Mother        Breast  . Stroke Mother   . Stroke Maternal Grandmother   . Stroke Maternal Aunt   . Stroke Maternal Uncle     Allergies  Allergen Reactions  . Sulfa Antibiotics Other (See Comments)    Unknown reaction    Medication list has been reviewed and updated.  Current Outpatient Medications on File Prior to Visit  Medication Sig Dispense Refill  . acetaminophen (TYLENOL) 650 MG CR tablet Take 1,300 mg by mouth every 8 (eight)  hours as needed for pain.    . Alcohol Swabs PADS Use to test up to 4 times daily 100 each 2  . allopurinol (ZYLOPRIM) 300 MG tablet TAKE 1 TABLET (300 MG TOTAL) BY MOUTH DAILY. 90 tablet 3  . blood glucose meter kit and supplies KIT Use up to four times daily as directed. Dx E13.40. 1 each 0  . Blood Glucose Monitoring Suppl (FREESTYLE LITE) DEVI Use to test up to 4 times daily 1 each 0  . clobetasol cream (TEMOVATE) 2.12 % Apply 1 application topically daily as needed (for rash).     . clopidogrel (PLAVIX) 75 MG tablet Take 1 tablet (75 mg total) by mouth daily with breakfast. 90 tablet 3  . diclofenac sodium (VOLTAREN) 1 % GEL Apply 1 application topically 2 (two) times daily as needed (for pain).    . Exenatide  ER (BYDUREON BCISE) 2 MG/0.85ML AUIJ Inject 2 mg into the skin once a week. 12 pen 3  . glucose blood (FREESTYLE LITE) test strip Use to test up to 4 times daily 100 each 12  . INVOKANA 300 MG TABS tablet TAKE 1 TABLET BY MOUTH ONCE DAILY 90 tablet 1  . Lancets (FREESTYLE) lancets Use to test up to 4 times daily 100 each 12  . linagliptin (TRADJENTA) 5 MG TABS tablet Take 1 tablet (5 mg total) by mouth daily. 30 tablet 11  . losartan (COZAAR) 100 MG tablet Take 1 tablet (100 mg total) by mouth daily. 90 tablet 3  . metaxalone (SKELAXIN) 800 MG tablet Take 1 tablet (800 mg total) by mouth every 6 (six) hours as needed. (Patient taking differently: Take 800 mg by mouth every 6 (six) hours as needed for muscle spasms. ) 60 tablet 1  . metFORMIN (GLUCOPHAGE-XR) 500 MG 24 hr tablet Take 2 tablets (1,000 mg total) by mouth 2 (two) times daily. 360 tablet 3  . nebivolol (BYSTOLIC) 10 MG tablet Take 10 mg by mouth every evening.     . rosuvastatin (CRESTOR) 5 MG tablet Take 5 mg by mouth every Monday, Wednesday, and Friday.     . SM LANCETS 33G MISC USE TO TEST BLOOD SUGAR TWICE DAILY 400 each PRN  . tamsulosin (FLOMAX) 0.4 MG CAPS capsule TAKE 1 CAPSULE BY MOUTH ONCE DAILY 30 capsule 11  .  zolpidem (AMBIEN) 5 MG tablet TAKE 1 TABLET BY MOUTH EACH NIGHT AT BEDTIME AS NEEDED FOR SLEEP (Patient taking differently: Take 5 mg by mouth at bedtime as needed for sleep. ) 30 tablet 2   No current facility-administered medications on file prior to visit.     Review of Systems:  As per HPI- otherwise negative. No fever or chills.  No chest pain or shortness of breath. He does have chronic neck pain, and is known to have significant arthritis of his neck.  However at this time he does not wish to do any further evaluation for this issue.  Physical Examination: Vitals:   04/12/18 0831  BP: 134/80  Pulse: 81  Resp: 16  Temp: 98.2 F (36.8 C)  SpO2: 98%   Vitals:   04/12/18 0831  Weight: 227 lb 6.4 oz (103.1 kg)  Height: 6' (1.829 m)   Body mass index is 30.84 kg/m. Ideal Body Weight: Weight in (lb) to have BMI = 25: 183.9  GEN: WDWN, NAD, Non-toxic, A & O x 3, look well, overweight.  Tall build HEENT: Atraumatic, Normocephalic. Neck supple. No masses, No LAD.  Bilateral TM wnl, oropharynx normal.  PEERL,EOMI.   Ears and Nose: No external deformity. CV: RRR, No M/G/R. No JVD. No thrill. No extra heart sounds. PULM: CTA B, no wheezes, crackles, rhonchi. No retractions. No resp. distress. No accessory muscle use. EXTR: No c/c/e NEURO Normal gait.  PSYCH: Normally interactive. Conversant. Not depressed or anxious appearing.  Calm demeanor.  Foot exam: Normal pulses.  Feet are warm and well-perfused.  Able to perceive monofilament normally.  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  We are checking on his labs as above, will do a B12 as he is concerned about possible deficiency related to  long-term  metformin use. Assuming labs are good, we will plan to visit again in 4-6 months. He continues to work on diet and exercise with a goal of maintaining his weight. If PSA goes up again he would like to see urology  Unfortunately his cardiologist has been sick.  We may need to refer him to a new cardiology clinic. Signed Lamar Blinks, MD   Received his labs, message to pt  Results for orders placed or performed in visit on 04/12/18  Hemoglobin A1c  Result Value Ref Range   Hgb A1c MFr Bld 8.0 (H) 4.6 - 6.5 %  Lipid panel  Result Value Ref Range   Cholesterol 114 0 - 200 mg/dL   Triglycerides 157.0 (H) 0.0 - 149.0 mg/dL   HDL 46.80 >39.00 mg/dL   VLDL 31.4 0.0 - 40.0 mg/dL   LDL Cholesterol 36 0 - 99 mg/dL   Total CHOL/HDL Ratio 2    NonHDL 67.02   B12  Result Value Ref Range   Vitamin B-12 279 211 - 911 pg/mL  Basic metabolic panel  Result Value Ref Range   Sodium 138 135 - 145 mEq/L   Potassium 4.1 3.5 - 5.1 mEq/L   Chloride 104 96 - 112 mEq/L   CO2 26 19 - 32 mEq/L   Glucose, Bld 200 (H) 70 - 99 mg/dL   BUN 22 6 - 23 mg/dL   Creatinine, Ser 0.99 0.40 - 1.50 mg/dL   Calcium 9.7 8.4 - 10.5 mg/dL   GFR 79.61 >60.00 mL/min  PSA  Result Value Ref Range   PSA 1.85 0.10 - 4.00 ng/mL   Lab Results  Component Value Date   PSA 1.85 04/12/2018   PSA 2.14 10/11/2017   PSA 1.80 01/16/2017

## 2018-04-09 MED FILL — LOSARTAN POTASSIUM 100 MG T: 100 | 30 days supply | Qty: 30 | Fill #2

## 2018-04-09 MED FILL — metFORMIN HCL ER 500 MG TB2: 500 | 90 days supply | Qty: 360 | Fill #2

## 2018-04-10 DIAGNOSIS — G4733 Obstructive sleep apnea (adult) (pediatric): Secondary | ICD-10-CM | POA: Diagnosis not present

## 2018-04-12 ENCOUNTER — Encounter: Payer: Self-pay | Admitting: Family Medicine

## 2018-04-12 ENCOUNTER — Ambulatory Visit: Payer: 59 | Admitting: Family Medicine

## 2018-04-12 VITALS — BP 134/80 | HR 81 | Temp 98.2°F | Resp 16 | Ht 72.0 in | Wt 227.4 lb

## 2018-04-12 DIAGNOSIS — R972 Elevated prostate specific antigen [PSA]: Secondary | ICD-10-CM | POA: Diagnosis not present

## 2018-04-12 DIAGNOSIS — Z5181 Encounter for therapeutic drug level monitoring: Secondary | ICD-10-CM

## 2018-04-12 DIAGNOSIS — E119 Type 2 diabetes mellitus without complications: Secondary | ICD-10-CM | POA: Diagnosis not present

## 2018-04-12 LAB — VITAMIN B12: VITAMIN B 12: 279 pg/mL (ref 211–911)

## 2018-04-12 LAB — LIPID PANEL
Cholesterol: 114 mg/dL (ref 0–200)
HDL: 46.8 mg/dL (ref >39.00)
LDL CALC: 36 mg/dL (ref 0–99)
NonHDL: 67.02
Total CHOL/HDL Ratio: 2
Triglycerides: 157 mg/dL — ABNORMAL HIGH (ref 0.0–149.0)
VLDL: 31.4 mg/dL (ref 0.0–40.0)

## 2018-04-12 LAB — BASIC METABOLIC PANEL
BUN: 22 mg/dL (ref 6–23)
CHLORIDE: 104 meq/L (ref 96–112)
CO2: 26 mEq/L (ref 19–32)
CREATININE: 0.99 mg/dL (ref 0.40–1.50)
Calcium: 9.7 mg/dL (ref 8.4–10.5)
GFR: 79.61 mL/min (ref >60.00)
Glucose, Bld: 200 mg/dL — ABNORMAL HIGH (ref 70–99)
Potassium: 4.1 mEq/L (ref 3.5–5.1)
SODIUM: 138 meq/L (ref 135–145)

## 2018-04-12 LAB — PSA: PSA: 1.85 ng/mL (ref 0.10–4.00)

## 2018-04-12 LAB — HEMOGLOBIN A1C: HEMOGLOBIN A1C: 8 % — AB (ref 4.6–6.5)

## 2018-04-12 NOTE — Patient Instructions (Signed)
Always a pleasure to see you- say hi to [facility] for me.  I will be in touch with your labs Let me know if you need a referral to a cardiologist

## 2018-04-20 DIAGNOSIS — G4733 Obstructive sleep apnea (adult) (pediatric): Secondary | ICD-10-CM | POA: Diagnosis not present

## 2018-04-21 ENCOUNTER — Other Ambulatory Visit: Payer: Self-pay | Admitting: Family Medicine

## 2018-04-23 MED ORDER — FREESTYLE LITE DEVI: 0 refills | Status: DC

## 2018-04-23 MED FILL — TRADJENTA 5 MG TABLET: 5 | 30 days supply | Qty: 30 | Fill #2

## 2018-04-23 MED FILL — FREESTYLE LITE TEST STRIP: 25 days supply | Qty: 100 | Fill #2

## 2018-04-23 MED FILL — ALLOPURINOL 300 MG TAB: 300 | 90 days supply | Qty: 90 | Fill #3

## 2018-04-23 MED FILL — INVOKANA 300 MG TABLET: 300 | 90 days supply | Qty: 90 | Fill #1

## 2018-04-23 MED FILL — ROSUVASTATIN CALCIUM 10 MG: 10 | 84 days supply | Qty: 36 | Fill #3

## 2018-05-04 MED FILL — LOSARTAN POTASSIUM 100 MG T: 100 | 60 days supply | Qty: 60 | Fill #3

## 2018-05-07 MED FILL — BYDUREON BCise 2 MG/0.85ML: 2 | 84 days supply | Qty: 10 | Fill #1

## 2018-05-14 ENCOUNTER — Encounter: Payer: Self-pay | Admitting: Family Medicine

## 2018-05-14 DIAGNOSIS — G4700 Insomnia, unspecified: Secondary | ICD-10-CM

## 2018-05-14 MED FILL — TAMSULOSIN HCL 0.4 MG CAP: 0.4 | 90 days supply | Qty: 90 | Fill #3

## 2018-05-15 MED ORDER — ZOLPIDEM TARTRATE 5 MG PO TABS: 5.0000 mg | ORAL_TABLET | Freq: Every evening | ORAL | 2 refills | Status: DC | PRN

## 2018-05-15 MED FILL — ZOLPIDEM TARTRATE 5 MG TAB: 5 | 30 days supply | Qty: 30 | Fill #0

## 2018-05-15 NOTE — Telephone Encounter (Signed)
NCCSR reviewed. 

## 2018-05-21 MED FILL — TRADJENTA 5 MG TABLET: 5 | 30 days supply | Qty: 30 | Fill #3

## 2018-06-15 ENCOUNTER — Encounter: Payer: Self-pay | Admitting: Family Medicine

## 2018-06-15 MED ORDER — NEBIVOLOL HCL 10 MG PO TABS: 10.0000 mg | ORAL_TABLET | Freq: Every evening | ORAL | 3 refills | Status: DC

## 2018-06-15 MED FILL — BYSTOLIC 10 MG TABLET: 10 | 90 days supply | Qty: 90 | Fill #0

## 2018-06-15 MED FILL — ZOLPIDEM TARTRATE 5 MG TAB: 5 | 30 days supply | Qty: 30 | Fill #1

## 2018-06-15 MED FILL — TRADJENTA 5 MG TABLET: 5 | 30 days supply | Qty: 30 | Fill #4

## 2018-07-02 MED FILL — TRADJENTA 5 MG TABLET: 5 | 30 days supply | Qty: 30 | Fill #5

## 2018-07-02 MED FILL — metFORMIN HCL ER 500 MG TB2: 500 | 90 days supply | Qty: 360 | Fill #3

## 2018-07-02 MED FILL — LOSARTAN POTASSIUM 100 MG T: 100 | 30 days supply | Qty: 30 | Fill #4

## 2018-07-02 MED FILL — ROSUVASTATIN CALCIUM 10 MG: 10 | 84 days supply | Qty: 36 | Fill #4

## 2018-07-02 MED FILL — CLOPIDOGREL 75 MG TABLET: 75 | 90 days supply | Qty: 90 | Fill #3

## 2018-07-04 MED FILL — SODIUM CHLORIDE 0.9% VIAL: 0.9 | 5 days supply | Qty: 60 | Fill #0

## 2018-07-16 MED FILL — ZOLPIDEM TARTRATE 5 MG TAB: 5 | 30 days supply | Qty: 30 | Fill #2

## 2018-07-27 MED FILL — BYDUREON BCise 2 MG/0.85ML: 2 | 84 days supply | Qty: 10 | Fill #2

## 2018-07-27 MED FILL — TRADJENTA 5 MG TABLET: 5 | 30 days supply | Qty: 30 | Fill #6

## 2018-07-27 MED FILL — LOSARTAN POTASSIUM 100 MG T: 100 | 30 days supply | Qty: 30 | Fill #5

## 2018-07-30 ENCOUNTER — Other Ambulatory Visit: Payer: Self-pay | Admitting: Family Medicine

## 2018-07-30 MED ORDER — GLUCOSE BLOOD VI STRP: ORAL_STRIP | 12 refills | Status: DC

## 2018-07-30 MED FILL — FREESTYLE LITE TEST STRIP: 25 days supply | Qty: 100 | Fill #0

## 2018-07-30 NOTE — Telephone Encounter (Signed)
Copied from [place] [id number]. Topic: Quick Communication - Rx Refill/Question >> Jul 30, 2018  1:47 PM Dimple Nanas, RN wrote: Medication:   glucose blood (FREESTYLE LITE) Test strips  Has the patient contacted their pharmacy? No. Current prescription expired (Agent: If no, request that the patient contact the pharmacy for the refill.) (Agent: If yes, when and what did the pharmacy advise?)  Preferred Pharmacy (with phone number or street name): [facility]  Agent: Please be advised that RX refills may take up to 3 business days. We ask that you follow-up with your pharmacy.

## 2018-07-30 NOTE — Telephone Encounter (Signed)
Refills sent

## 2018-08-07 ENCOUNTER — Other Ambulatory Visit: Payer: Self-pay | Admitting: Family Medicine

## 2018-08-07 DIAGNOSIS — E119 Type 2 diabetes mellitus without complications: Secondary | ICD-10-CM

## 2018-08-07 DIAGNOSIS — N401 Enlarged prostate with lower urinary tract symptoms: Secondary | ICD-10-CM

## 2018-08-07 DIAGNOSIS — R350 Frequency of micturition: Secondary | ICD-10-CM

## 2018-08-07 DIAGNOSIS — M1000 Idiopathic gout, unspecified site: Secondary | ICD-10-CM

## 2018-08-08 MED FILL — ALLOPURINOL 300 MG TABS: 300 | 90 days supply | Qty: 90 | Fill #0

## 2018-08-08 MED FILL — TAMSULOSIN HCL 0.4 MG CAP: 0.4 | 90 days supply | Qty: 90 | Fill #0

## 2018-08-08 MED FILL — INVOKANA 300 MG TABLET: 300 | 90 days supply | Qty: 90 | Fill #0

## 2018-08-15 DIAGNOSIS — H1859 Other hereditary corneal dystrophies: Secondary | ICD-10-CM | POA: Diagnosis not present

## 2018-08-20 MED FILL — TRADJENTA 5 MG TABLET: 5 | 30 days supply | Qty: 30 | Fill #7

## 2018-08-20 MED FILL — LOSARTAN POTASSIUM 100 MG T: 100 | 30 days supply | Qty: 30 | Fill #6

## 2018-08-29 ENCOUNTER — Telehealth: Payer: Self-pay | Admitting: Neurology

## 2018-08-29 NOTE — Telephone Encounter (Signed)
Due to current COVID 19 pandemic, our office is severely reducing in office visits until further notice, in order to minimize the risk to our patients and healthcare providers.   Called patient to offer a virtual visit for his 5/6 appointment. Patient accepted and verbalized understanding of the doxy.me process and understands that he will receive an e-mail with Dr. Edwena Felty link and directions. Patient is aware that he will receive calls from RN and front office staff.  Pt understands that although there may be some limitations with this type of visit, we will take all precautions to reduce any security or privacy concerns.  Pt understands that this will be treated like an in office visit and we will file with pt's insurance, and there may be a patient responsible charge related to this service.

## 2018-09-05 ENCOUNTER — Encounter: Payer: Self-pay | Admitting: Neurology

## 2018-09-05 ENCOUNTER — Ambulatory Visit: Payer: 59 | Admitting: Neurology

## 2018-09-05 NOTE — Telephone Encounter (Signed)

## 2018-09-06 DIAGNOSIS — H43812 Vitreous degeneration, left eye: Secondary | ICD-10-CM | POA: Diagnosis not present

## 2018-09-10 ENCOUNTER — Ambulatory Visit (INDEPENDENT_AMBULATORY_CARE_PROVIDER_SITE_OTHER): Payer: 59 | Admitting: Neurology

## 2018-09-10 ENCOUNTER — Encounter: Payer: Self-pay | Admitting: Neurology

## 2018-09-10 ENCOUNTER — Other Ambulatory Visit: Payer: Self-pay

## 2018-09-10 DIAGNOSIS — E781 Pure hyperglyceridemia: Secondary | ICD-10-CM | POA: Diagnosis not present

## 2018-09-10 DIAGNOSIS — I2510 Atherosclerotic heart disease of native coronary artery without angina pectoris: Secondary | ICD-10-CM

## 2018-09-10 DIAGNOSIS — Z9989 Dependence on other enabling machines and devices: Secondary | ICD-10-CM | POA: Diagnosis not present

## 2018-09-10 DIAGNOSIS — E1140 Type 2 diabetes mellitus with diabetic neuropathy, unspecified: Secondary | ICD-10-CM

## 2018-09-10 DIAGNOSIS — I10 Essential (primary) hypertension: Secondary | ICD-10-CM | POA: Diagnosis not present

## 2018-09-10 DIAGNOSIS — G4733 Obstructive sleep apnea (adult) (pediatric): Secondary | ICD-10-CM | POA: Diagnosis not present

## 2018-09-10 NOTE — Patient Instructions (Signed)

## 2018-09-10 NOTE — Progress Notes (Signed)
Virtual Visit via Video Note  I connected with Posey Boyer on 09/10/18 at  9:30 AM EDT by a video enabled telemedicine application and verified that I am speaking with the correct person using two identifiers.  Location: Patient: at home  Provider: at [facility]   I discussed the limitations of evaluation and management by telemedicine and the availability of in person appointments. The patient expressed understanding and agreed to proceed.   SLEEP MEDICINE CLINIC   Provider:  Larey Seat, M D  Referring Provider: Darreld Mclean, MD Primary Care Physician:  Darreld Mclean, MD  No chief complaint on file.   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 Lorelei Pont for OSA , transfer of sleep care. He is now seen I  A virtual visit for CPAP compliance. He has used a by now 69 year old machine that can be repleced as soon as he agrees to it, or needs it.   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  His average user time is 7 hours 10 minutes, set pressure for the air sense 10 AutoSet is 11 cmH2O pressure was 2 cm EPR, he does have air leakage the 95th percentile is 37 L/min but his events per hour are just 2.5 AHI.  Very few central apneas are emerging the residual apnea index is mostly consistent of hypopnea and obstructive sleep apnea.  The patient reports that he has noted some air leakage but usually he wakes up with a :-) on his CPAP display.  He sleeps well he reads in bed and is asleep very promptly once he puts the CPAP on he may have one nocturia rarely to and after each bathroom break is awake for 10 to 15 minutes.  He rises refreshed and restored but he feels physically stiff which may have other reasons and apnea.     The patient  suffered an occipital stroke in 2014. He has a long history of migraines. He had a last sleep study in [place], by Dr. Scarlette Calico. 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 This remains worrisome for him as he should not take Triptan- medication since his presumed visual aura persisted for about 14 hours he was seen by one of his colleagues , Dr. Arlyss Queen, who initiated a stroke workup. 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 He is still taking Onglyza,. He is seen today because he had not had a repeat sleep study for the last 12 years. He has been on the same setting and on the same type CPAP machine. He is interested in reevaluating his sleep apnea and treatment options.He recalls that about 8 years ago he was on a medical mission and used his original CPAP machine on a car battery and it broke. He replaced the  the same type however at the same settings.   Interval history from 08/12/2015 [name] is here after his recent sleep study performed on 05/13/2015, during which he was diagnosed again with sleep apnea at an AHI of 49.8 no REM sleep was noted during the diagnostic part, supine AHI was 54.8  and nonsupine 27.3 average end tidal CO2 was 30.64 torr, no prolonged oxygen desaturations were noted. Occasional premature ventricular beats were seen, the patient was titrated to CPAP and was doing well on 11 cm water pressure. A 2 cm flex function was requested. Today we also having  the opportunity to see the first download residual AHI is 2.6, at 11 cm water pressure with an average user time of 6 hours and 51 minutes and 100% compliance for days of use and over 4 hours of daily use. [name] would like an auto set function that can be used as he loses weight.  Sleep medical history and family sleep history: Brother on CPAP, his "sister should be".   Social history: married, no tobacco use ever, no ETOH , caffeine user - plain iced tea , 3-4 glasses a day.  Interval history from 08/11/2016, as the pleasure of meeting with [name] today for a yearly compliance visit the patient has again reached 97% compliance with an average user time of 6 hours and 57 minutes the pressure set at 11 cm water with 2 cm EPR the residual AHI is 3.0 there are some air leaks but no major ones, there is no evidence of treatment emergent central apneas. He likes his machine and he feels that he is benefiting his sleep quality.  He has a new grand child.   Interval 08-30-2017, at the pleasure of meeting Ms. Dr. Shanon Brow [facility] today for his regularly CPAP compliance.  The patient recently went through a mission in [place] and hemostasis was able to use his CPAP.  However he has noted that when electricity was not provided or available to run his machine he was also not recharging his batteries.  He is able to sleep much better on the CPAP and would like to be able to use a CPAP on prolonged flights areas.  We will discuss a travel CPAP may be a refurbished travel CPAP that would be cheaper to purchase. His compliance download is as usual, excellent.  His machine is set at 11 cmH2O was 2 cm EPR, his residual AHI was 3.3 there are some moderate air leaks, use of time on average was 6 hours and 46 minutes 100% of days and 29 of these days over 4 hours consecutive views were documented establishing a compliance of 97%.  Review of Systems: Out of a complete 14 system review, the patient complains of only the following symptoms, and all other reviewed systems are negative.  The fatigue severity score was today endorsed at 13points and the Epworth sleepiness score at 7 points. 0 points were endorsed on the 15 point geriatric depression score.  Total knee replacement 4 years ago. Arthroscopic left knee 15 years ago. Knee pain while gardening, golf not affected.  Neck pain,  especially on long flights.     Social History   Socioeconomic History  . Marital status: Married    Spouse name: Learta Codding  . Number of children: 4  . Years of education: 86  . Highest education level: Not on file  Occupational History  . Occupation: MD    Employer: [employer]  Social Needs  . Financial resource strain: Not on file  . Food insecurity:    Worry: Not on file    Inability: Not on file  . Transportation needs:    Medical: Not on file    Non-medical: Not on file  Tobacco Use  . Smoking status: Never Smoker  . Smokeless tobacco: Never  Used  Substance and Sexual Activity  . Alcohol use: No    Alcohol/week: 0.0 standard drinks  . Drug use: No  . Sexual activity: Not on file  Lifestyle  . Physical activity:    Days per week: Not on file    Minutes per session: Not on file  . Stress: Not on file  Relationships  . Social connections:    Talks on phone: Not on file    Gets together: Not on file    Attends religious service: Not on file    Active member of club or organization: Not on file    Attends meetings of clubs or organizations: Not on file    Relationship status: Not on file  . Intimate partner violence:    Fear of current or ex partner: Not on file    Emotionally abused: Not on file    Physically abused: Not on file    Forced sexual activity: Not on file  Other Topics Concern  . Not on file  Social History Narrative   Patient lives at home with family.   Caffeine Use: occasionally tea    Family History  Problem Relation Age of Onset  . Cancer Mother        Breast  . Stroke Mother   . Stroke Maternal Grandmother   . Stroke Maternal Aunt   . Stroke Maternal Uncle     Past Medical History:  Diagnosis Date  . Anemia   . Arthritis   . Diabetes mellitus   . Gout   . Hyperlipidemia   . Hypertension   . Localized osteoarthritis of left knee 11/20/2012  . Migraine headache with aura   . Neuromuscular disorder ([place])    " mild periferal  neauropathy  . Osteoarthritis of left hip 11/20/2012  . PVC (premature ventricular contraction)   . Sleep apnea    cpap    Past Surgical History:  Procedure Laterality Date  . CHOLECYSTECTOMY  1997  . CVA     CVA, occiptal  . CVA     CVA, occipital  . knee arthoscopy  2006   Left Knee  . LEFT HEART CATH AND CORONARY ANGIOGRAPHY N/A 12/07/2017   Procedure: LEFT HEART CATH AND CORONARY ANGIOGRAPHY;  Surgeon: Troy Sine, MD;  Location: [facility];  Service: Cardiovascular;  Laterality: N/A;  . left total hip    . left unicarpmental knee    . LOOP RECORDER IMPLANT N/A 04/19/2013   Procedure: LOOP RECORDER IMPLANT;  Surgeon: Evans Lance, MD;  Location: [facility] C[facility];  Service: Cardiovascular;  Laterality: N/A;  . PARTIAL KNEE ARTHROPLASTY Left 11/20/2012   Procedure: UNICOMPARTMENTAL KNEE;  Surgeon: Johnny Bridge, MD;  Location: [facility];  Service: Orthopedics;  Laterality: Left;  . REPLACEMENT UNICONDYLAR JOINT KNEE Left 11/20/2012   Dr Mardelle Matte  . TEE WITHOUT CARDIOVERSION N/A 04/19/2013   Procedure: TRANSESOPHAGEAL ECHOCARDIOGRAM (TEE);  Surgeon: Larey Dresser, MD;  Location: [facility];  Service: Cardiovascular;  Laterality: N/A;  . TONSILLECTOMY AND ADENOIDECTOMY     Childhood  . TOTAL HIP ARTHROPLASTY Left 11/20/2012   Dr Mardelle Matte  . TOTAL HIP ARTHROPLASTY Left 11/20/2012   Procedure: TOTAL HIP ARTHROPLASTY;  Surgeon: Johnny Bridge, MD;  Location: [facility];  Service: Orthopedics;  Laterality: Left;    Current Outpatient Medications  Medication Sig Dispense Refill  . acetaminophen (TYLENOL) 650 MG CR tablet Take 1,300 mg by mouth every 8 (eight) hours as needed for pain.    [name]  Alcohol Swabs PADS Use to test up to 4 times daily 100 each 2  . allopurinol (ZYLOPRIM) 300 MG tablet TAKE 1 TABLET (300 MG TOTAL) BY MOUTH DAILY. 90 tablet 1  . blood glucose meter kit and supplies KIT Use up to four times daily as directed. Dx E13.40. 1 each 0  . Blood Glucose Monitoring Suppl  (FREESTYLE LITE) DEVI Use to test up to 4 times daily 1 each 0  . clobetasol cream (TEMOVATE) 8.65 % Apply 1 application topically daily as needed (for rash).     . clopidogrel (PLAVIX) 75 MG tablet Take 1 tablet (75 mg total) by mouth daily with breakfast. 90 tablet 3  . diclofenac sodium (VOLTAREN) 1 % GEL Apply 1 application topically 2 (two) times daily as needed (for pain).    . Exenatide ER (BYDUREON BCISE) 2 MG/0.85ML AUIJ Inject 2 mg into the skin once a week. 12 pen 3  . glucose blood (FREESTYLE LITE) test strip USE TO TEST UP TO 4 TIMES DAILY 100 each 12  . INVOKANA 300 MG TABS tablet TAKE 1 TABLET BY MOUTH ONCE DAILY 90 tablet 1  . Lancets (FREESTYLE) lancets Use to test up to 4 times daily 100 each 12  . linagliptin (TRADJENTA) 5 MG TABS tablet Take 1 tablet (5 mg total) by mouth daily. 30 tablet 11  . losartan (COZAAR) 100 MG tablet Take 1 tablet (100 mg total) by mouth daily. 90 tablet 3  . metaxalone (SKELAXIN) 800 MG tablet Take 1 tablet (800 mg total) by mouth every 6 (six) hours as needed. (Patient taking differently: Take 800 mg by mouth every 6 (six) hours as needed for muscle spasms. ) 60 tablet 1  . metFORMIN (GLUCOPHAGE-XR) 500 MG 24 hr tablet Take 2 tablets (1,000 mg total) by mouth 2 (two) times daily. 360 tablet 3  . nebivolol (BYSTOLIC) 10 MG tablet Take 1 tablet (10 mg total) by mouth every evening. 90 tablet 3  . rosuvastatin (CRESTOR) 5 MG tablet Take 5 mg by mouth every Monday, Wednesday, and Friday.     . SM LANCETS 33G MISC USE TO TEST BLOOD SUGAR TWICE DAILY 400 each PRN  . tamsulosin (FLOMAX) 0.4 MG CAPS capsule TAKE 1 CAPSULE BY MOUTH ONCE DAILY 30 capsule 11  . zolpidem (AMBIEN) 5 MG tablet Take 1 tablet (5 mg total) by mouth at bedtime as needed for sleep. 30 tablet 2   No current facility-administered medications for this visit.     Allergies as of 09/10/2018 - Review Complete 09/05/2018  Allergen Reaction Noted  . Sulfa antibiotics Other (See Comments)  06/01/2011    Vitals: There were no vitals taken for this visit. Last Weight:  Wt Readings from Last 1 Encounters:  04/12/18 227 lb 6.4 oz (103.1 kg)   HQI:ONGEX is no height or weight on file to calculate BMI.     Last Height:   Ht Readings from Last 1 Encounters:  04/12/18 6' (1.829 m)    Observation: General: The patient is awake, alert and appears not in acute distress. The patient is well groomed. Head: Normocephalic, atraumatic. Neck is supple.  Mallampati 1, the airway is short and ends abruptly is not narrow. The palate is of normal shape the uvula is midline the tongue is midline without fasciculation or tremor. neck circumference: 17. 5"  Nasal airflow unrestricted , Mild Retrognathia is seen.    Neurologic exam : The patient is awake and alert, oriented to place and time.   Attention  span & concentration ability appears normal.  Speech is fluent,  without dysarthria, dysphonia or aphasia.  Mood and affect are appropriate.  Cranial nerves: Pupils are equal -Facial motor strength is symmetric and tongue and uvula move midline. Shoulder shrug was symmetrical.  Deep tendon reflexes: in the  upper and lower extremities are symmetric and intact.   Follow Up Instructions: continue using CPAP!    I discussed the assessment and treatment plan with the patient. The patient was provided an opportunity to ask questions and all were answered. The patient agreed with the plan and demonstrated an understanding of the instructions.   The patient was advised to call back or seek an in-person evaluation if the symptoms worsen or if the condition fails to improve as anticipated.  I provided 16 minutes of non-face-to-face time during this encounter.   Larey Seat, MD    THe endorsed the Epworth sleepiness score at 6 out of 24 points today, assessment excellent compliance with CPAP for the treatment of OSA  We will order CPAP supplies and for chronic continue to see him once  a year.  I would like to follow this patient personally.  Plan:  Rv in 77 month with MD .    Larey Seat, MD  09/10/2018   CC: Darreld Mclean, Md [address] [place],  [id number]   CC: Dr Antony Contras, primary neurologist.

## 2018-09-11 MED FILL — BYSTOLIC 10 MG TABLET: 10 | 90 days supply | Qty: 90 | Fill #1

## 2018-09-17 MED FILL — TRADJENTA 5 MG TABLET: 5 | 30 days supply | Qty: 30 | Fill #8

## 2018-10-12 ENCOUNTER — Other Ambulatory Visit: Payer: Self-pay | Admitting: Family Medicine

## 2018-10-12 DIAGNOSIS — E119 Type 2 diabetes mellitus without complications: Secondary | ICD-10-CM

## 2018-10-12 DIAGNOSIS — I10 Essential (primary) hypertension: Secondary | ICD-10-CM

## 2018-10-12 DIAGNOSIS — Z8673 Personal history of transient ischemic attack (TIA), and cerebral infarction without residual deficits: Secondary | ICD-10-CM

## 2018-10-12 MED FILL — TRADJENTA 5 MG TABLET: 5 | 30 days supply | Qty: 30 | Fill #9

## 2018-10-12 MED FILL — BYDUREON BCise 2 MG/0.85ML: 2 | 84 days supply | Qty: 10 | Fill #3

## 2018-10-12 MED FILL — FREESTYLE LITE TEST STRIP: 25 days supply | Qty: 100 | Fill #1

## 2018-10-13 ENCOUNTER — Encounter: Payer: Self-pay | Admitting: Family Medicine

## 2018-10-14 MED ORDER — VALACYCLOVIR HCL 1 G PO TABS: ORAL_TABLET | ORAL | 0 refills | Status: DC

## 2018-10-16 MED FILL — LOSARTAN POTASSIUM 100 MG T: 100 | 30 days supply | Qty: 30 | Fill #0

## 2018-10-16 MED FILL — CLOPIDOGREL 75 MG TABLET: 75 | 90 days supply | Qty: 90 | Fill #0

## 2018-10-16 MED FILL — metFORMIN HCL ER 500 MG TB2: 500 | 90 days supply | Qty: 360 | Fill #0

## 2018-10-26 DIAGNOSIS — G4733 Obstructive sleep apnea (adult) (pediatric): Secondary | ICD-10-CM | POA: Diagnosis not present

## 2018-11-01 ENCOUNTER — Encounter: Payer: Self-pay | Admitting: Family Medicine

## 2018-11-01 MED ORDER — ROSUVASTATIN CALCIUM 5 MG PO TABS: 5.0000 mg | ORAL_TABLET | ORAL | 4 refills | Status: DC

## 2018-11-01 MED FILL — INVOKANA 300 MG TABLET: 300 | 90 days supply | Qty: 90 | Fill #1

## 2018-11-01 MED FILL — TAMSULOSIN HCL 0.4 MG CAP: 0.4 | 90 days supply | Qty: 90 | Fill #1

## 2018-11-01 MED FILL — ALLOPURINOL 300 MG TAB: 300 | 90 days supply | Qty: 90 | Fill #1

## 2018-11-01 MED FILL — FREESTYLE LITE TEST STRIP: 25 days supply | Qty: 100 | Fill #2

## 2018-11-01 MED FILL — ROSUVASTATIN CALCIUM 5 MG T: 5 | 90 days supply | Qty: 39 | Fill #0

## 2018-11-06 MED FILL — TRADJENTA 5 MG TABLET: 5 | 30 days supply | Qty: 30 | Fill #10

## 2018-11-09 MED FILL — LOSARTAN POTASSIUM 100 MG T: 100 | 30 days supply | Qty: 30 | Fill #1

## 2018-11-21 MED FILL — FREESTYLE LITE TEST STRIP: 25 days supply | Qty: 100 | Fill #3

## 2018-12-01 MED FILL — TRADJENTA 5 MG TABLET: 5 | 30 days supply | Qty: 30 | Fill #11

## 2018-12-07 MED FILL — LOSARTAN POTASSIUM 100 MG T: 100 | 30 days supply | Qty: 30 | Fill #2

## 2018-12-11 MED FILL — FREESTYLE LITE TEST STRIP: 25 days supply | Qty: 100 | Fill #4

## 2018-12-11 NOTE — Progress Notes (Addendum)
[facility] [place], [place], [place] [address] [phone]  Date:  12/12/2018   Name:  Phillip [name]   DOB:  March 26, 1951   MRN:  [id number]  PCP:  Darreld Mclean, MD    Chief Complaint: Diabetes (follow up) and Hypertension   History of Present Illness:  Phillip [name] is a 68 y.o. very pleasant male patient who presents with the following:  [name] is here today for a follow-up visit History of DM, CAD, migraine HA, previous stroke with full recovery, HTN, hyperlipidemia, OSA  Lab Results  Component Value Date   HGBA1C 8.0 (H) 04/12/2018   Eye exam due next month Shingrix- he is thinking about this  Labs today  Allopurinol 300 daily plavix bydureon invokana 300 tradjenta- his insurance wants him to change to [place] in October, I would go ahead and change his prescription cozaar Metformin 1000 BID  crestor bystolic flomax   Wt Readings from Last 3 Encounters:  12/12/18 218 lb (98.9 kg)  04/12/18 227 lb 6.4 oz (103.1 kg)  12/07/17 225 lb (102.1 kg)   He has been gradually working on weight loss - highest weight was 254, he is working on this slowly Glucose "does not make me happy" -he wishes his numbers were better Fasting he may run 150, before dinner might be 120 or so, around 200 after meals   His BP has been ok at home His ears are sometimes itchy, he does tend to use q-tips and flushing it out  He has noted a few more joint issues in his hands recently- will check uric acid today He is still taking allopurinol He has seen Gram[facility] for his hand issues  He also has joint issues in his toes -thinks this is likely just osteoarthritis  He notes that he has a slow urine stream, he does not have to get up at night that much however  He will try increasing his flomax to 0.8 for a couple of weeks to se if this might be helpful for him If PSA going up will refer to urology  Neuropathy in  his feet is just numbness, not pain  His 4 children and several grandchildren are doing well.  Unfortunately he has not been able to participate in his usual medical mission trips recently due to the [place] pandemic   Lab Results  Component Value Date   PSA 1.85 04/12/2018   PSA 2.14 10/11/2017   PSA 1.80 01/16/2017     Patient Active Problem List   Diagnosis Date Noted  . OSA on CPAP 08/12/2015  . Ventricular tachycardia ([place]) 11/10/2014  . CAD (coronary artery disease), native coronary artery   . Variants of migraine, not elsewhere classified, without mention of intractable migraine without mention of status migrainosus 08/14/2013  . History of ischemic stroke without residual deficits 04/17/2013  . ED (erectile dysfunction) 02/01/2012  . Gout 09/05/2011  . Hyperlipidemia 09/05/2011  . Hypertriglyceridemia 09/05/2011  . PVC (premature ventricular contraction) 09/05/2011  . Type 2 diabetes, controlled, with neuropathy ([place]) 06/01/2011  . Essential hypertension, benign 06/01/2011    Past Medical History:  Diagnosis Date  . Anemia   . Arthritis   . Diabetes mellitus   . Gout   . Hyperlipidemia   . Hypertension   . Localized osteoarthritis of left knee 11/20/2012  . Migraine headache with aura   . Neuromuscular disorder ([place])    " mild  periferal neauropathy  . Osteoarthritis of left hip 11/20/2012  . PVC (premature ventricular contraction)   . Sleep apnea    cpap    Past Surgical History:  Procedure Laterality Date  . CHOLECYSTECTOMY  1997  . CVA     CVA, occiptal  . CVA     CVA, occipital  . knee arthoscopy  2006   Left Knee  . LEFT HEART CATH AND CORONARY ANGIOGRAPHY N/A 12/07/2017   Procedure: LEFT HEART CATH AND CORONARY ANGIOGRAPHY;  Surgeon: Troy Sine, MD;  Location: [facility];  Service: Cardiovascular;  Laterality: N/A;  . left total hip    . left unicarpmental knee    . LOOP RECORDER IMPLANT N/A 04/19/2013   Procedure: LOOP RECORDER  IMPLANT;  Surgeon: Evans Lance, MD;  Location: [facility] CATH [facility];  Service: Cardiovascular;  Laterality: N/A;  . PARTIAL KNEE ARTHROPLASTY Left 11/20/2012   Procedure: UNICOMPARTMENTAL KNEE;  Surgeon: Johnny Bridge, MD;  Location: [facility];  Service: Orthopedics;  Laterality: Left;  . REPLACEMENT UNICONDYLAR JOINT KNEE Left 11/20/2012   Dr Mardelle Matte  . TEE WITHOUT CARDIOVERSION N/A 04/19/2013   Procedure: TRANSESOPHAGEAL ECHOCARDIOGRAM (TEE);  Surgeon: Larey Dresser, MD;  Location: [facility];  Service: Cardiovascular;  Laterality: N/A;  . TONSILLECTOMY AND ADENOIDECTOMY     Childhood  . TOTAL HIP ARTHROPLASTY Left 11/20/2012   Dr Mardelle Matte  . TOTAL HIP ARTHROPLASTY Left 11/20/2012   Procedure: TOTAL HIP ARTHROPLASTY;  Surgeon: Johnny Bridge, MD;  Location: [facility];  Service: Orthopedics;  Laterality: Left;    Social History   Tobacco Use  . Smoking status: Never Smoker  . Smokeless tobacco: Never Used  Substance Use Topics  . Alcohol use: No    Alcohol/week: 0.0 standard drinks  . Drug use: No    Family History  Problem Relation Age of Onset  . Cancer Mother        Breast  . Stroke Mother   . Stroke Maternal Grandmother   . Stroke Maternal Aunt   . Stroke Maternal Uncle     Allergies  Allergen Reactions  . Sulfa Antibiotics Other (See Comments)    Unknown reaction    Medication list has been reviewed and updated.  Current Outpatient Medications on File Prior to Visit  Medication Sig Dispense Refill  . acetaminophen (TYLENOL) 650 MG CR tablet Take 1,300 mg by mouth every 8 (eight) hours as needed for pain.    . Alcohol Swabs PADS Use to test up to 4 times daily 100 each 2  . allopurinol (ZYLOPRIM) 300 MG tablet TAKE 1 TABLET (300 MG TOTAL) BY MOUTH DAILY. 90 tablet 1  . blood glucose meter kit and supplies KIT Use up to four times daily as directed. Dx E13.40. 1 each 0  . Blood Glucose Monitoring Suppl (FREESTYLE LITE) DEVI Use to test up to 4 times daily 1 each 0  .  clobetasol cream (TEMOVATE) 1.44 % Apply 1 application topically daily as needed (for rash).     . clopidogrel (PLAVIX) 75 MG tablet TAKE 1 TABLET BY MOUTH DAILY WITH BREAKFAST. 90 tablet 1  . diclofenac sodium (VOLTAREN) 1 % GEL Apply 1 application topically 2 (two) times daily as needed (for pain).    . Exenatide ER (BYDUREON BCISE) 2 MG/0.85ML AUIJ Inject 2 mg into the skin once a week. 12 pen 3  . glucose blood (FREESTYLE LITE) test strip USE TO TEST UP TO 4 TIMES DAILY 100 each 12  .  INVOKANA 300 MG TABS tablet TAKE 1 TABLET BY MOUTH ONCE DAILY 90 tablet 1  . linagliptin (TRADJENTA) 5 MG TABS tablet Take 1 tablet (5 mg total) by mouth daily. 30 tablet 11  . losartan (COZAAR) 100 MG tablet TAKE 1 TABLET BY MOUTH DAILY. 90 tablet 1  . metaxalone (SKELAXIN) 800 MG tablet Take 1 tablet (800 mg total) by mouth every 6 (six) hours as needed. (Patient taking differently: Take 800 mg by mouth every 6 (six) hours as needed for muscle spasms. ) 60 tablet 1  . metFORMIN (GLUCOPHAGE-XR) 500 MG 24 hr tablet TAKE 2 TABLETS BY MOUTH 2 TIMES DAILY. 360 tablet 1  . nebivolol (BYSTOLIC) 10 MG tablet Take 1 tablet (10 mg total) by mouth every evening. 90 tablet 3  . rosuvastatin (CRESTOR) 5 MG tablet Take 1 tablet (5 mg total) by mouth every Monday, Wednesday, and Friday. 39 tablet 4  . SM LANCETS 33G MISC USE TO TEST BLOOD SUGAR TWICE DAILY 400 each PRN  . tamsulosin (FLOMAX) 0.4 MG CAPS capsule TAKE 1 CAPSULE BY MOUTH ONCE DAILY 30 capsule 11  . zolpidem (AMBIEN) 5 MG tablet Take 1 tablet (5 mg total) by mouth at bedtime as needed for sleep. 30 tablet 2   No current facility-administered medications on file prior to visit.     Review of Systems:  As per HPI- otherwise negative. No fever or chills [place] notes that he often plays 18 holes of golf, walking and pushing his club bag.  He has no chest pain or shortness of breath even with going up hills Physical Examination: Vitals:   12/12/18 1623  BP:  126/70  Pulse: 84  Resp: 16  Temp: (!) 96.8 F (36 C)  SpO2: 96%   Vitals:   12/12/18 1623  Weight: 218 lb (98.9 kg)   Body mass index is 29.57 kg/m. Ideal Body Weight:    GEN: WDWN, NAD, Non-toxic, A & O x 3, looks well, lost a few lbs  HEENT: Atraumatic, Normocephalic. Neck supple. No masses, No LAD.  TM wnl, ear canals normal Ears and Nose: No external deformity. CV: RRR, No M/G/R. No JVD. No thrill. No extra heart sounds. PULM: CTA B, no wheezes, crackles, rhonchi. No retractions. No resp. distress. No accessory muscle use. ABD: S, NT, ND, +BS. No rebound. No HSM. EXTR: No c/c/e NEURO Normal gait.  PSYCH: Normally interactive. Conversant. Not depressed or anxious appearing.  Calm demeanor.    Assessment and Plan:   ICD-10-CM   1. Controlled type 2 diabetes mellitus without complication, without long-term current use of insulin (HCC)  E11.9 Comprehensive metabolic panel    Hemoglobin A1c    Exenatide ER (BYDUREON BCISE) 2 MG/0.85ML AUIJ    sitaGLIPtin (JANUVIA) 100 MG tablet  2. Essential hypertension  I10 CBC    Comprehensive metabolic panel  3. History of ischemic stroke without residual deficits  Z86.73   4. Mixed hyperlipidemia  E78.2 Lipid panel  5. Benign prostatic hyperplasia with urinary frequency  N40.1 PSA   R35.0   6. OSA on CPAP  G47.33    Z99.89   7. Gout, unspecified cause, unspecified chronicity, unspecified site  M10.9 Uric acid  8. Ear itching  L29.9 acetic acid-hydrocortisone (VOSOL-HC) OTIC solution    DISCONTINUED: acetic acid-hydrocortisone (VOSOL-HC) OTIC solution   Here today for follow-up visit Somewhat difficult to control diabetes on several agents.  However the light has lost about 10 pounds.  Await A1c today Blood pressure well controlled Lipids  pending He will try increasing Flomax to 0.8 mg, will see if this helps.  Await PSA Prescription for acetic acid hydrocortisone eardrops to use as needed itching Will plan further follow- up  pending labs.   Follow-up: No follow-ups on file.  Meds ordered this encounter  Medications  . DISCONTD: acetic acid-hydrocortisone (VOSOL-HC) OTIC solution    Sig: Place 3 drops into both ears 3 (three) times daily as needed.    Dispense:  10 mL    Refill:  1  . Exenatide ER (BYDUREON BCISE) 2 MG/0.85ML AUIJ    Sig: Inject 2 mg into the skin once a week.    Dispense:  12 pen    Refill:  3  . sitaGLIPtin (JANUVIA) 100 MG tablet    Sig: Take 1 tablet (100 mg total) by mouth daily.    Dispense:  90 tablet    Refill:  3    Pt does not need yet, changing over from [place] this fall  . acetic acid-hydrocortisone (VOSOL-HC) OTIC solution    Sig: Place 3 drops into both ears 3 (three) times daily as needed.    Dispense:  10 mL    Refill:  1   Orders Placed This Encounter  Procedures  . CBC  . Comprehensive metabolic panel  . Hemoglobin A1c  . Lipid panel  . Uric acid  . PSA    _0 @    Signed Lamar Blinks, MD  Received his labs, message to pt  Results for orders placed or performed in visit on 12/12/18  CBC  Result Value Ref Range   WBC 8.0 4.0 - 10.5 K/uL   RBC 4.49 4.22 - 5.81 Mil/uL   Platelets 276.0 150.0 - 400.0 K/uL   Hemoglobin 14.0 13.0 - 17.0 g/dL   HCT 42.6 39.0 - 52.0 %   MCV 95.0 78.0 - 100.0 fl   MCHC 32.9 30.0 - 36.0 g/dL   RDW 12.8 11.5 - 15.5 %  Comprehensive metabolic panel  Result Value Ref Range   Sodium 137 135 - 145 mEq/L   Potassium 4.3 3.5 - 5.1 mEq/L   Chloride 103 96 - 112 mEq/L   CO2 24 19 - 32 mEq/L   Glucose, Bld 229 (H) 70 - 99 mg/dL   BUN 33 (H) 6 - 23 mg/dL   Creatinine, Ser 1.21 0.40 - 1.50 mg/dL   Total Bilirubin 0.5 0.2 - 1.2 mg/dL   Alkaline Phosphatase 68 39 - 117 U/L   AST 14 0 - 37 U/L   ALT 17 0 - 53 U/L   Total Protein 6.6 6.0 - 8.3 g/dL   Albumin 4.4 3.5 - 5.2 g/dL   Calcium 10.0 8.4 - 10.5 mg/dL   GFR 59.30 (L) >60.00 mL/min  Hemoglobin A1c  Result Value Ref Range   Hgb A1c MFr Bld 8.1 (H) 4.6 - 6.5 %   Lipid panel  Result Value Ref Range   Cholesterol 149 0 - 200 mg/dL   Triglycerides (H) 0.0 - 149.0 mg/dL    579.0 Triglyceride is over 400; calculations on Lipids are invalid.   HDL 39.00 (L) >39.00 mg/dL   Total CHOL/HDL Ratio 4   Uric acid  Result Value Ref Range   Uric Acid, Serum 4.8 4.0 - 7.8 mg/dL  PSA  Result Value Ref Range   PSA 2.23 0.10 - 4.00 ng/mL  LDL cholesterol, direct  Result Value Ref Range   Direct LDL 52.0 mg/dL

## 2018-12-12 ENCOUNTER — Ambulatory Visit: Payer: 59 | Admitting: Family Medicine

## 2018-12-12 ENCOUNTER — Encounter: Payer: Self-pay | Admitting: Family Medicine

## 2018-12-12 ENCOUNTER — Other Ambulatory Visit: Payer: Self-pay

## 2018-12-12 VITALS — BP 126/70 | HR 84 | Temp 96.8°F | Resp 16 | Wt 218.0 lb

## 2018-12-12 DIAGNOSIS — Z9989 Dependence on other enabling machines and devices: Secondary | ICD-10-CM

## 2018-12-12 DIAGNOSIS — E782 Mixed hyperlipidemia: Secondary | ICD-10-CM | POA: Diagnosis not present

## 2018-12-12 DIAGNOSIS — L299 Pruritus, unspecified: Secondary | ICD-10-CM | POA: Diagnosis not present

## 2018-12-12 DIAGNOSIS — I10 Essential (primary) hypertension: Secondary | ICD-10-CM

## 2018-12-12 DIAGNOSIS — N401 Enlarged prostate with lower urinary tract symptoms: Secondary | ICD-10-CM

## 2018-12-12 DIAGNOSIS — G4733 Obstructive sleep apnea (adult) (pediatric): Secondary | ICD-10-CM | POA: Diagnosis not present

## 2018-12-12 DIAGNOSIS — R350 Frequency of micturition: Secondary | ICD-10-CM | POA: Diagnosis not present

## 2018-12-12 DIAGNOSIS — M109 Gout, unspecified: Secondary | ICD-10-CM

## 2018-12-12 DIAGNOSIS — E119 Type 2 diabetes mellitus without complications: Secondary | ICD-10-CM

## 2018-12-12 DIAGNOSIS — Z8673 Personal history of transient ischemic attack (TIA), and cerebral infarction without residual deficits: Secondary | ICD-10-CM | POA: Diagnosis not present

## 2018-12-12 MED ORDER — HYDROCORTISONE-ACETIC ACID 1-2 % OT SOLN: 3.0000 [drp] | Freq: Three times a day (TID) | OTIC | 1 refills | Status: DC | PRN

## 2018-12-12 MED ORDER — SITAGLIPTIN PHOSPHATE 100 MG PO TABS: 100.0000 mg | ORAL_TABLET | Freq: Every day | ORAL | 3 refills | Status: DC

## 2018-12-12 MED ORDER — BYDUREON BCISE 2 MG/0.85ML ~~LOC~~ AUIJ: 2.0000 mg | AUTO-INJECTOR | SUBCUTANEOUS | 3 refills | Status: DC

## 2018-12-12 MED FILL — HYDROCORTISON-ACETIC ACID S: 1-2 | 11 days supply | Qty: 10 | Fill #0

## 2018-12-12 NOTE — Patient Instructions (Addendum)
Great to see you as always!  Have a great rest of the day, give my love to your family I will be in touch with your labs Will send an rx for januvia to the pharmacy for you Try ear drops for your ear symptoms  Assuming labs ok let's visit in 6 months

## 2018-12-13 ENCOUNTER — Encounter: Payer: Self-pay | Admitting: Family Medicine

## 2018-12-13 DIAGNOSIS — Z5181 Encounter for therapeutic drug level monitoring: Secondary | ICD-10-CM

## 2018-12-13 DIAGNOSIS — E782 Mixed hyperlipidemia: Secondary | ICD-10-CM

## 2018-12-13 DIAGNOSIS — N401 Enlarged prostate with lower urinary tract symptoms: Secondary | ICD-10-CM

## 2018-12-13 DIAGNOSIS — E119 Type 2 diabetes mellitus without complications: Secondary | ICD-10-CM

## 2018-12-13 LAB — CBC
HCT: 42.6 % (ref 39.0–52.0)
Hemoglobin: 14 g/dL (ref 13.0–17.0)
MCHC: 32.9 g/dL (ref 30.0–36.0)
MCV: 95 fl (ref 78.0–100.0)
Platelets: 276 10*3/uL (ref 150.0–400.0)
RBC: 4.49 Mil/uL (ref 4.22–5.81)
RDW: 12.8 % (ref 11.5–15.5)
WBC: 8 10*3/uL (ref 4.0–10.5)

## 2018-12-13 LAB — COMPREHENSIVE METABOLIC PANEL
ALT: 17 U/L (ref 0–53)
AST: 14 U/L (ref 0–37)
Albumin: 4.4 g/dL (ref 3.5–5.2)
Alkaline Phosphatase: 68 U/L (ref 39–117)
BUN: 33 mg/dL — ABNORMAL HIGH (ref 6–23)
CO2: 24 mEq/L (ref 19–32)
Calcium: 10 mg/dL (ref 8.4–10.5)
Chloride: 103 mEq/L (ref 96–112)
Creatinine, Ser: 1.21 mg/dL (ref 0.40–1.50)
GFR: 59.3 mL/min — ABNORMAL LOW (ref >60.00)
Glucose, Bld: 229 mg/dL — ABNORMAL HIGH (ref 70–99)
Potassium: 4.3 mEq/L (ref 3.5–5.1)
Sodium: 137 mEq/L (ref 135–145)
Total Bilirubin: 0.5 mg/dL (ref 0.2–1.2)
Total Protein: 6.6 g/dL (ref 6.0–8.3)

## 2018-12-13 LAB — LIPID PANEL
Cholesterol: 149 mg/dL (ref 0–200)
HDL: 39 mg/dL — ABNORMAL LOW (ref >39.00)
Total CHOL/HDL Ratio: 4
Triglycerides: 579 mg/dL — ABNORMAL HIGH (ref 0.0–149.0)

## 2018-12-13 LAB — URIC ACID: Uric Acid, Serum: 4.8 mg/dL (ref 4.0–7.8)

## 2018-12-13 LAB — LDL CHOLESTEROL, DIRECT: Direct LDL: 52 mg/dL

## 2018-12-13 LAB — PSA: PSA: 2.23 ng/mL (ref 0.10–4.00)

## 2018-12-13 LAB — HEMOGLOBIN A1C: Hgb A1c MFr Bld: 8.1 % — ABNORMAL HIGH (ref 4.6–6.5)

## 2018-12-14 MED ORDER — LANTUS SOLOSTAR 100 UNIT/ML ~~LOC~~ SOPN: 5.0000 [IU] | PEN_INJECTOR | Freq: Every day | SUBCUTANEOUS | 6 refills | Status: DC

## 2018-12-14 MED ORDER — HYDROCORTISONE-ACETIC ACID 1-2 % OT SOLN: 3.0000 [drp] | Freq: Three times a day (TID) | OTIC | 1 refills | Status: DC | PRN

## 2018-12-14 NOTE — Addendum Note (Signed)
Addended by: Wynonia Musty A on: 12/14/2018 07:22 AM   Modules accepted: Orders

## 2018-12-17 ENCOUNTER — Ambulatory Visit: Payer: 59 | Admitting: Family Medicine

## 2018-12-17 MED FILL — BYSTOLIC 10 MG TABLET: 10 | 90 days supply | Qty: 90 | Fill #2

## 2018-12-17 MED FILL — LANTUS SOLOSTAR 100 UNITS/M: 100 | 90 days supply | Qty: 9 | Fill #0

## 2018-12-20 MED ORDER — PEN NEEDLES 32G X 5 MM MISC: 1.0000 | Freq: Every day | 6 refills | Status: AC

## 2018-12-20 MED FILL — UNIFINE PENTIPS 31GX3/16": 31G X 5 MM | 90 days supply | Qty: 100 | Fill #0

## 2018-12-20 MED FILL — UNIFINE PENTIPS 31GX3/16: 31G X 5 MM | 90 days supply | Qty: 100 | Fill #0

## 2018-12-20 NOTE — Addendum Note (Signed)
Addended by: Lamar Blinks C on: 12/20/2018 06:06 AM   Modules accepted: Orders

## 2018-12-31 MED FILL — LOSARTAN POTASSIUM 100 MG T: 100 | 30 days supply | Qty: 30 | Fill #3

## 2018-12-31 MED FILL — FREESTYLE LITE TEST STRIP: 25 days supply | Qty: 100 | Fill #5

## 2019-01-01 ENCOUNTER — Other Ambulatory Visit: Payer: Self-pay | Admitting: Family Medicine

## 2019-01-01 DIAGNOSIS — E119 Type 2 diabetes mellitus without complications: Secondary | ICD-10-CM

## 2019-01-02 MED FILL — TRADJENTA 5 MG TABLET: 5 | 30 days supply | Qty: 30 | Fill #0

## 2019-01-08 MED FILL — CLOPIDOGREL 75 MG TABLET: 75 | 90 days supply | Qty: 90 | Fill #1

## 2019-01-08 MED FILL — metFORMIN HCL ER 500 MG TB2: 500 | 90 days supply | Qty: 360 | Fill #1

## 2019-01-18 MED FILL — LOSARTAN POTASSIUM 100 MG T: 100 | 30 days supply | Qty: 30 | Fill #3

## 2019-01-18 MED FILL — CLOPIDOGREL 75 MG TABLET: 75 | 90 days supply | Qty: 90 | Fill #1

## 2019-01-18 MED FILL — metFORMIN HCL ER 500 MG TB2: 500 | 90 days supply | Qty: 360 | Fill #1

## 2019-01-21 ENCOUNTER — Encounter: Payer: Self-pay | Admitting: Family Medicine

## 2019-01-21 DIAGNOSIS — E119 Type 2 diabetes mellitus without complications: Secondary | ICD-10-CM

## 2019-01-25 ENCOUNTER — Encounter: Payer: Self-pay | Admitting: Family Medicine

## 2019-01-25 ENCOUNTER — Other Ambulatory Visit: Payer: Self-pay | Admitting: Family Medicine

## 2019-01-25 ENCOUNTER — Other Ambulatory Visit (INDEPENDENT_AMBULATORY_CARE_PROVIDER_SITE_OTHER): Payer: 59

## 2019-01-25 ENCOUNTER — Other Ambulatory Visit: Payer: Self-pay

## 2019-01-25 ENCOUNTER — Ambulatory Visit (INDEPENDENT_AMBULATORY_CARE_PROVIDER_SITE_OTHER): Payer: 59

## 2019-01-25 DIAGNOSIS — E782 Mixed hyperlipidemia: Secondary | ICD-10-CM | POA: Diagnosis not present

## 2019-01-25 DIAGNOSIS — E119 Type 2 diabetes mellitus without complications: Secondary | ICD-10-CM

## 2019-01-25 DIAGNOSIS — Z23 Encounter for immunization: Secondary | ICD-10-CM

## 2019-01-25 DIAGNOSIS — M1000 Idiopathic gout, unspecified site: Secondary | ICD-10-CM

## 2019-01-25 LAB — LIPID PANEL
Cholesterol: 120 mg/dL (ref 0–200)
HDL: 47.5 mg/dL (ref >39.00)
LDL Cholesterol: 50 mg/dL (ref 0–99)
NonHDL: 72.86
Total CHOL/HDL Ratio: 3
Triglycerides: 116 mg/dL (ref 0.0–149.0)
VLDL: 23.2 mg/dL (ref 0.0–40.0)

## 2019-01-25 LAB — BASIC METABOLIC PANEL
BUN: 22 mg/dL (ref 6–23)
CO2: 26 mEq/L (ref 19–32)
Calcium: 9.8 mg/dL (ref 8.4–10.5)
Chloride: 103 mEq/L (ref 96–112)
Creatinine, Ser: 1.04 mg/dL (ref 0.40–1.50)
GFR: 70.6 mL/min (ref >60.00)
Glucose, Bld: 125 mg/dL — ABNORMAL HIGH (ref 70–99)
Potassium: 4.3 mEq/L (ref 3.5–5.1)
Sodium: 139 mEq/L (ref 135–145)

## 2019-01-25 LAB — HEMOGLOBIN A1C: Hgb A1c MFr Bld: 7.9 % — ABNORMAL HIGH (ref 4.6–6.5)

## 2019-01-25 MED FILL — ROSUVASTATIN CALCIUM 5 MG T: 5 | 90 days supply | Qty: 39 | Fill #1

## 2019-01-25 MED FILL — BYDUREON BCise 2 MG/0.85ML: 2 | 84 days supply | Qty: 10 | Fill #0

## 2019-01-25 MED FILL — TAMSULOSIN HCL 0.4 MG CAP: 0.4 | 90 days supply | Qty: 90 | Fill #2

## 2019-01-25 MED FILL — FREESTYLE LITE TEST STRIP: 25 days supply | Qty: 100 | Fill #5

## 2019-01-25 NOTE — Progress Notes (Addendum)
Here for flu shot

## 2019-01-28 MED FILL — ALLOPURINOL 300 MG TABS: 300 | 90 days supply | Qty: 90 | Fill #0

## 2019-01-28 MED FILL — INVOKANA 300 MG TABLET: 300 | 90 days supply | Qty: 90 | Fill #0

## 2019-01-29 ENCOUNTER — Telehealth: Payer: Self-pay | Admitting: Family Medicine

## 2019-01-29 DIAGNOSIS — R3912 Poor urinary stream: Secondary | ICD-10-CM | POA: Diagnosis not present

## 2019-01-29 DIAGNOSIS — N401 Enlarged prostate with lower urinary tract symptoms: Secondary | ICD-10-CM | POA: Diagnosis not present

## 2019-01-29 MED ORDER — LANTUS SOLOSTAR 100 UNIT/ML ~~LOC~~ SOPN: 20.0000 [IU] | PEN_INJECTOR | Freq: Every day | SUBCUTANEOUS | 6 refills | Status: DC

## 2019-01-29 MED ORDER — LANTUS SOLOSTAR 100 UNIT/ML ~~LOC~~ SOPN: 5.0000 [IU] | PEN_INJECTOR | Freq: Every day | SUBCUTANEOUS | 6 refills | Status: DC

## 2019-01-29 NOTE — Addendum Note (Signed)
Addended by: Lamar Blinks C on: 01/29/2019 03:20 PM   Modules accepted: Orders

## 2019-01-29 NOTE — Telephone Encounter (Signed)
Requested medication (s) are due for refill today: yes  Requested medication (s) are on the active medication list: yes  Last refill:  12/13/2018  Future visit scheduled: yes  Notes to clinic:  Pharmacy stated pt's Insulin Glargine (LANTUS SOLOSTAR) 100 UNIT/ML Solostar Pen has increased and they need a new rx to reflect that for insurance purposes. Please advise.    Requested Prescriptions  Pending Prescriptions Disp Refills   Insulin Glargine (LANTUS SOLOSTAR) 100 UNIT/ML Solostar Pen 5 pen 6    Sig: Inject 5-10 Units into the skin daily. Titrate as needed     Endocrinology:  Diabetes - Insulins Passed - 01/29/2019  8:13 AM      Passed - HBA1C is between 0 and 7.9 and within 180 days    Hgb A1c MFr Bld  Date Value Ref Range Status  01/25/2019 7.9 (H) 4.6 - 6.5 % Final    Comment:    Glycemic Control Guidelines for People with Diabetes:Non Diabetic:  <6%Goal of Therapy: <7%Additional Action Suggested:  >8%          Passed - Valid encounter within last 6 months    Recent Outpatient Visits          1 month ago Controlled type 2 diabetes mellitus without complication, without long-term current use of insulin ([place])   [facility], MD   9 months ago Controlled type 2 diabetes mellitus without complication, without long-term current use of insulin ([place])   [facility], Gay Filler, MD   1 year ago Physical exam   [facility], [place] C, MD   1 year ago Controlled type 2 diabetes mellitus without complication, without long-term current use of insulin ([place])   [facility], [place] C, MD   2 years ago Controlled type 2 diabetes mellitus without complication, without long-term current use of insulin ([place])   [facility], Gay Filler, MD      Future Appointments            In 4 months [place], Gay Filler, MD [facility]

## 2019-01-29 NOTE — Telephone Encounter (Signed)
Please advise regarding sig on Lantus.

## 2019-01-29 NOTE — Telephone Encounter (Signed)
done

## 2019-01-29 NOTE — Telephone Encounter (Signed)
Monica, from pharmacy, called requesting call back from clinical staff regarding instructions. Please advise Best contact: [phone]

## 2019-01-29 NOTE — Telephone Encounter (Signed)
Pharmacy stated pt's Insulin Glargine (LANTUS SOLOSTAR) 100 UNIT/ML Solostar Pen has increased and they need a new rx to reflect that for insurance purposes. Please advise.  [place], [place] [facility] [place] [phone] (Phone) [phone] (Fax)

## 2019-01-30 MED FILL — LANTUS SOLOSTAR 100 UNITS/M: 100 | 75 days supply | Qty: 15 | Fill #0

## 2019-02-11 MED FILL — LOSARTAN POTASSIUM 100 MG T: 100 | 30 days supply | Qty: 30 | Fill #4

## 2019-02-15 MED FILL — FREESTYLE LITE TEST STRIP: 25 days supply | Qty: 100 | Fill #6

## 2019-02-19 DIAGNOSIS — E119 Type 2 diabetes mellitus without complications: Secondary | ICD-10-CM | POA: Diagnosis not present

## 2019-03-07 MED FILL — LOSARTAN POTASSIUM 100 MG T: 100 | 30 days supply | Qty: 30 | Fill #5

## 2019-03-11 MED FILL — FREESTYLE LITE TEST STRIP: 25 days supply | Qty: 100 | Fill #7

## 2019-03-11 MED FILL — BYSTOLIC 10 MG TABLET: 10 | 90 days supply | Qty: 90 | Fill #3

## 2019-03-14 MED FILL — UNIFINE PENTIPS 31GX3/16: 31G X 5 MM | 90 days supply | Qty: 100 | Fill #1

## 2019-03-14 MED FILL — UNIFINE PENTIPS 31GX3/16": 31G X 5 MM | 90 days supply | Qty: 100 | Fill #1

## 2019-04-01 MED FILL — FREESTYLE LITE TEST STRIP: 25 days supply | Qty: 100 | Fill #8

## 2019-04-06 ENCOUNTER — Other Ambulatory Visit: Payer: Self-pay | Admitting: Family Medicine

## 2019-04-06 DIAGNOSIS — I10 Essential (primary) hypertension: Secondary | ICD-10-CM

## 2019-04-08 MED FILL — LOSARTAN POTASSIUM 100 MG T: 100 | 30 days supply | Qty: 30 | Fill #0

## 2019-04-08 MED FILL — LANTUS SOLOSTAR 100 UNITS/M: 100 | 75 days supply | Qty: 15 | Fill #1

## 2019-04-11 DIAGNOSIS — Z20828 Contact with and (suspected) exposure to other viral communicable diseases: Secondary | ICD-10-CM | POA: Diagnosis not present

## 2019-04-12 ENCOUNTER — Other Ambulatory Visit: Payer: Self-pay | Admitting: Family Medicine

## 2019-04-12 DIAGNOSIS — Z8673 Personal history of transient ischemic attack (TIA), and cerebral infarction without residual deficits: Secondary | ICD-10-CM

## 2019-04-12 DIAGNOSIS — E119 Type 2 diabetes mellitus without complications: Secondary | ICD-10-CM

## 2019-04-12 MED FILL — metFORMIN HCL ER 500 MG TB2: 500 | 90 days supply | Qty: 360 | Fill #0

## 2019-04-12 MED FILL — CLOPIDOGREL 75 MG TABLET: 75 | 90 days supply | Qty: 90 | Fill #0

## 2019-04-13 MED FILL — BYDUREON BCise 2 MG/0.85ML: 2 | 84 days supply | Qty: 10 | Fill #1

## 2019-04-22 MED FILL — FREESTYLE LITE TEST STRIP: 25 days supply | Qty: 100 | Fill #9

## 2019-04-22 MED FILL — INVOKANA 300 MG TABLET: 300 | 90 days supply | Qty: 90 | Fill #1

## 2019-04-22 MED FILL — ALLOPURINOL 300 MG TABS: 300 | 90 days supply | Qty: 90 | Fill #1

## 2019-04-22 MED FILL — HYDROCORTISON-ACETIC ACID S: 1-2 | 11 days supply | Qty: 10 | Fill #1

## 2019-04-24 MED FILL — ROSUVASTATIN CALCIUM 5 MG T: 5 | 90 days supply | Qty: 39 | Fill #2

## 2019-04-25 MED FILL — TAMSULOSIN HCL 0.4 MG CAP: 0.4 | 90 days supply | Qty: 90 | Fill #3

## 2019-05-02 MED FILL — LOSARTAN POTASSIUM 100 MG T: 100 | 30 days supply | Qty: 30 | Fill #1

## 2019-05-22 ENCOUNTER — Other Ambulatory Visit: Payer: Self-pay

## 2019-05-22 NOTE — Progress Notes (Addendum)
[facility] [place], [place], [place] [address] [phone]  Date:  05/23/2019   Name:  Phillip [name]   DOB:  05/06/1950   MRN:  [id number]  PCP:  Darreld Mclean, MD    Chief Complaint: Diabetes (travel, doxy for malaria, cipro )   History of Present Illness:  Phillip [name] is a 69 y.o. 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 Yvone Neu seen by him in September He saw ophthalmology in October  Foot exam due A1c due- check today  Colon cancer screen up-to-date Can suggest Shingrix- not done yet, he will think about this   Most recent labs September  Wt Readings from Last 3 Encounters:  05/23/19 231 lb (104.8 kg)  12/12/18 218 lb (98.9 kg)  04/12/18 227 lb 6.4 oz (103.1 kg)   His weight is up a bit, fasting glucose generally 110- 120 Not having any lows  Lab Results  Component Value Date   HGBA1C 7.9 (H) 01/25/2019   Allopurinol Plavix Bydureon weekly Lantus Invokana 300 Metformin Losartan Bystolic Crestor Flomax Ambien as needed  He is traveling soon to [place]- would like a refill of his Lorrin Mais and some doxycycline 100 mg daily for malaria and some cipro to have on hand   He will be gone for 2 weeks- [place]  He got his 2nd covid shot  2 days ago Patient Active Problem List   Diagnosis Date Noted  . OSA on CPAP 08/12/2015  . Ventricular tachycardia ([place]) 11/10/2014  . CAD (coronary artery disease), native coronary artery   . Variants of migraine, not elsewhere classified, without mention of intractable migraine without mention of status migrainosus 08/14/2013  . History of ischemic stroke without residual deficits 04/17/2013  . ED (erectile dysfunction) 02/01/2012  . Gout 09/05/2011  . Hyperlipidemia 09/05/2011  . Hypertriglyceridemia 09/05/2011  . PVC (premature ventricular contraction) 09/05/2011  . Type 2 diabetes, controlled, with neuropathy ([place]) 06/01/2011  . Essential hypertension, benign 06/01/2011    Past Medical History:  Diagnosis Date  . Anemia   . Arthritis   . Diabetes mellitus   . Gout   . Hyperlipidemia   . Hypertension   . Localized osteoarthritis of left knee 11/20/2012  . Migraine headache with aura   . Neuromuscular disorder ([place])    " mild periferal neauropathy  . Osteoarthritis of left hip 11/20/2012  . PVC (premature ventricular contraction)   . Sleep apnea    cpap    Past Surgical History:  Procedure Laterality Date  . CHOLECYSTECTOMY  1997  . CVA     CVA, occiptal  . CVA     CVA, occipital  . knee arthoscopy  2006   Left Knee  . LEFT HEART CATH  AND CORONARY ANGIOGRAPHY N/A 12/07/2017   Procedure: LEFT HEART CATH AND CORONARY ANGIOGRAPHY;  Surgeon: Troy Sine, MD;  Location: [facility];  Service: Cardiovascular;  Laterality: N/A;  . left total hip    . left unicarpmental knee    . LOOP RECORDER IMPLANT N/A 04/19/2013   Procedure: LOOP RECORDER IMPLANT;  Surgeon: Evans Lance, MD;  Location: [facility] CATH LAB;  Service: Cardiovascular;  Laterality: N/A;  . PARTIAL KNEE ARTHROPLASTY Left 11/20/2012   Procedure: UNICOMPARTMENTAL KNEE;  Surgeon: Johnny Bridge, MD;  Location: [facility];  Service: Orthopedics;  Laterality: Left;  . REPLACEMENT UNICONDYLAR JOINT KNEE Left  11/20/2012   Dr Mardelle Matte  . TEE WITHOUT CARDIOVERSION N/A 04/19/2013   Procedure: TRANSESOPHAGEAL ECHOCARDIOGRAM (TEE);  Surgeon: Larey Dresser, MD;  Location: [facility];  Service: Cardiovascular;  Laterality: N/A;  . TONSILLECTOMY AND ADENOIDECTOMY     Childhood  . TOTAL HIP ARTHROPLASTY Left 11/20/2012   Dr Mardelle Matte  . TOTAL HIP ARTHROPLASTY Left 11/20/2012   Procedure: TOTAL HIP ARTHROPLASTY;  Surgeon: Johnny Bridge, MD;  Location: [facility];  Service: Orthopedics;  Laterality: Left;    Social History   Tobacco Use  . Smoking status: Never Smoker  . Smokeless tobacco: Never Used  Substance Use Topics  . Alcohol use: No    Alcohol/week: 0.0 standard drinks  . Drug use: No    Family History  Problem Relation Age of Onset  . Cancer Mother        Breast  . Stroke Mother   . Stroke Maternal Grandmother   . Stroke Maternal Aunt   . Stroke Maternal Uncle     Allergies  Allergen Reactions  . Sulfa Antibiotics Other (See Comments)    Unknown reaction    Medication list has been reviewed and updated.  Current Outpatient Medications on File Prior to Visit  Medication Sig Dispense Refill  . acetaminophen (TYLENOL) 650 MG CR tablet Take 1,300 mg by mouth every 8 (eight) hours as needed for pain.    [name] acetic acid-hydrocortisone (VOSOL-HC) OTIC solution Place 3 drops into both ears 3 (three) times daily as needed. 10 mL 1  . acetic acid-hydrocortisone (VOSOL-HC) OTIC solution Place 3 drops into both ears 3 (three) times daily as needed. 10 mL 1  . Alcohol Swabs PADS Use to test up to 4 times daily 100 each 2  . allopurinol (ZYLOPRIM) 300 MG tablet TAKE 1 TABLET (300 MG TOTAL) BY MOUTH DAILY. 90 tablet 1  . blood glucose meter kit and supplies KIT Use up to four times daily as directed. Dx E13.40. 1 each 0  . Blood Glucose Monitoring Suppl (FREESTYLE LITE) DEVI Use to test up to 4 times daily 1 each 0  . clobetasol cream (TEMOVATE) 8.14 % Apply 1 application topically daily as  needed (for rash).     . clopidogrel (PLAVIX) 75 MG tablet TAKE 1 TABLET BY MOUTH DAILY WITH BREAKFAST. 90 tablet 1  . diclofenac sodium (VOLTAREN) 1 % GEL Apply 1 application topically 2 (two) times daily as needed (for pain).    . Exenatide ER (BYDUREON BCISE) 2 MG/0.85ML AUIJ Inject 2 mg into the skin once a week. 12 pen 3  . glucose blood (FREESTYLE LITE) test strip USE TO TEST UP TO 4 TIMES DAILY 100 each 12  . Insulin Glargine (LANTUS SOLOSTAR) 100 UNIT/ML Solostar Pen Inject 20 Units into the skin daily. Titrate as needed 10 pen 6  . Insulin Pen Needle (PEN  NEEDLES) 32G X 5 MM MISC 1 each by Does not apply route daily. Use for basal insulin injection 100 each 6  . INVOKANA 300 MG TABS tablet TAKE 1 TABLET BY MOUTH ONCE DAILY 90 tablet 1  . losartan (COZAAR) 100 MG tablet TAKE 1 TABLET BY MOUTH DAILY. 30 tablet 1  . metaxalone (SKELAXIN) 800 MG tablet Take 1 tablet (800 mg total) by mouth every 6 (six) hours as needed. (Patient taking differently: Take 800 mg by mouth every 6 (six) hours as needed for muscle spasms. ) 60 tablet 1  . metFORMIN (GLUCOPHAGE-XR) 500 MG 24 hr tablet TAKE 2 TABLETS BY MOUTH 2 TIMES DAILY. 360 tablet 1  . nebivolol (BYSTOLIC) 10 MG tablet Take 1 tablet (10 mg total) by mouth every evening. 90 tablet 3  . rosuvastatin (CRESTOR) 5 MG tablet Take 1 tablet (5 mg total) by mouth every Monday, Wednesday, and Friday. 39 tablet 4  . SM LANCETS 33G MISC USE TO TEST BLOOD SUGAR TWICE DAILY 400 each PRN  . tamsulosin (FLOMAX) 0.4 MG CAPS capsule TAKE 1 CAPSULE BY MOUTH ONCE DAILY 30 capsule 11  . zolpidem (AMBIEN) 5 MG tablet Take 1 tablet (5 mg total) by mouth at bedtime as needed for sleep. 30 tablet 2   No current facility-administered medications on file prior to visit.    Review of Systems:  As per HPI- otherwise negative. No CP or SOB Active with walking and golf   Physical Examination: Vitals:   05/23/19 0922  BP: 130/80  Pulse: 68  Resp: 16  Temp: (!) 96  F (35.6 C)  SpO2: 98%   Vitals:   05/23/19 0922  Weight: 231 lb (104.8 kg)  Height: 6' (1.829 m)   Body mass index is 31.33 kg/m. Ideal Body Weight: Weight in (lb) to have BMI = 25: 183.9  GEN: WDWN, NAD, Non-toxic, A & O x 3, overweight, looks well  HEENT: Atraumatic, Normocephalic. Neck supple. No masses, No LAD. Ears and Nose: No external deformity. CV: RRR, No M/G/R. No JVD. No thrill. No extra heart sounds. PULM: CTA B, no wheezes, crackles, rhonchi. No retractions. No resp. distress. No accessory muscle use. EXTR: No c/c/e NEURO Normal gait.  PSYCH: Normally interactive. Conversant. Not depressed or anxious appearing.  Calm demeanor.  Foot exam today- normal  Minimal LLE edema since knee replacement   Assessment and Plan: Controlled type 2 diabetes mellitus without complication, without long-term current use of insulin ([place]) - Plan: Hemoglobin R6V, Basic metabolic panel  Benign prostatic hyperplasia with urinary frequency  Mixed hyperlipidemia  Essential hypertension  Medication monitoring encounter  OSA on CPAP  Travel advice encounter - Plan: doxycycline (VIBRAMYCIN) 100 MG capsule, ciprofloxacin (CIPRO) 500 MG tablet  Following up today Await a1c and will be in touch with him He is seeing neurology in March to discuss OSA Refilled doxy for malaria prophy and cipro to have on hand for diarrhea-upcoming medical mission trip to [place]  Will plan further follow- up pending labs.  Moderate med decision making today This visit occurred during the SARS-CoV-2 public health emergency.  Safety protocols were in place, including screening questions prior to the visit, additional usage of staff PPE, and extensive cleaning of exam room while observing appropriate contact time as indicated for disinfecting solutions.    Signed Lamar Blinks, MD  Received labs as below, message to patient A1c has improved from 7.9%  Results for orders placed or performed in visit  on 05/23/19  Hemoglobin A1c  Result Value Ref Range   Hgb A1c MFr Bld 7.3 (H) 4.6 - 6.5 %  Basic metabolic panel  Result Value Ref Range   Sodium 137 135 - 145 mEq/L   Potassium 4.1 3.5 - 5.1 mEq/L   Chloride 103 96 - 112 mEq/L   CO2 27 19 - 32 mEq/L   Glucose, Bld 133 (H) 70 - 99 mg/dL   BUN 18 6 - 23 mg/dL   Creatinine, Ser 0.99 0.40 - 1.50 mg/dL   GFR 74.66 >60.00 mL/min   Calcium 9.5 8.4 - 10.5 mg/dL

## 2019-05-23 ENCOUNTER — Encounter: Payer: Self-pay | Admitting: Family Medicine

## 2019-05-23 ENCOUNTER — Ambulatory Visit: Payer: 59 | Admitting: Family Medicine

## 2019-05-23 ENCOUNTER — Other Ambulatory Visit: Payer: Self-pay

## 2019-05-23 VITALS — BP 130/80 | HR 68 | Temp 96.0°F | Resp 16 | Ht 72.0 in | Wt 231.0 lb

## 2019-05-23 DIAGNOSIS — I10 Essential (primary) hypertension: Secondary | ICD-10-CM | POA: Diagnosis not present

## 2019-05-23 DIAGNOSIS — Z5181 Encounter for therapeutic drug level monitoring: Secondary | ICD-10-CM

## 2019-05-23 DIAGNOSIS — N401 Enlarged prostate with lower urinary tract symptoms: Secondary | ICD-10-CM

## 2019-05-23 DIAGNOSIS — E119 Type 2 diabetes mellitus without complications: Secondary | ICD-10-CM | POA: Diagnosis not present

## 2019-05-23 DIAGNOSIS — E782 Mixed hyperlipidemia: Secondary | ICD-10-CM

## 2019-05-23 DIAGNOSIS — Z9989 Dependence on other enabling machines and devices: Secondary | ICD-10-CM | POA: Diagnosis not present

## 2019-05-23 DIAGNOSIS — G4733 Obstructive sleep apnea (adult) (pediatric): Secondary | ICD-10-CM | POA: Diagnosis not present

## 2019-05-23 DIAGNOSIS — Z7184 Encounter for health counseling related to travel: Secondary | ICD-10-CM

## 2019-05-23 DIAGNOSIS — R350 Frequency of micturition: Secondary | ICD-10-CM

## 2019-05-23 LAB — BASIC METABOLIC PANEL
BUN: 18 mg/dL (ref 6–23)
CO2: 27 mEq/L (ref 19–32)
Calcium: 9.5 mg/dL (ref 8.4–10.5)
Chloride: 103 mEq/L (ref 96–112)
Creatinine, Ser: 0.99 mg/dL (ref 0.40–1.50)
GFR: 74.66 mL/min (ref >60.00)
Glucose, Bld: 133 mg/dL — ABNORMAL HIGH (ref 70–99)
Potassium: 4.1 mEq/L (ref 3.5–5.1)
Sodium: 137 mEq/L (ref 135–145)

## 2019-05-23 LAB — HEMOGLOBIN A1C: Hgb A1c MFr Bld: 7.3 % — ABNORMAL HIGH (ref 4.6–6.5)

## 2019-05-23 MED ORDER — CIPROFLOXACIN HCL 500 MG PO TABS: 500.0000 mg | ORAL_TABLET | Freq: Two times a day (BID) | ORAL | 0 refills | Status: DC

## 2019-05-23 MED ORDER — DOXYCYCLINE HYCLATE 100 MG PO CAPS: ORAL_CAPSULE | ORAL | 0 refills | Status: DC

## 2019-05-23 MED FILL — DOXYCYCLINE HYC 100 MG CAPS: 100 | 60 days supply | Qty: 60 | Fill #0

## 2019-05-23 MED FILL — CIPROFLOXACIN HCL 500 MG TA: 500 | 10 days supply | Qty: 20 | Fill #0

## 2019-05-23 NOTE — Patient Instructions (Addendum)
It was great to see you again today- have a safe journey and I will be in touch with your labs I am so sorry for the tragic loss of your grandchild.  We have been thinking about your family  Assuming all ok let's plan to visit in 4 months or so

## 2019-05-28 ENCOUNTER — Encounter: Payer: Self-pay | Admitting: Family Medicine

## 2019-05-28 DIAGNOSIS — G4700 Insomnia, unspecified: Secondary | ICD-10-CM

## 2019-05-28 MED ORDER — ZOLPIDEM TARTRATE 5 MG PO TABS: 5.0000 mg | ORAL_TABLET | Freq: Every evening | ORAL | 2 refills | Status: DC | PRN

## 2019-05-28 MED FILL — ZOLPIDEM TARTRATE 5 MG TAB: 5 | 30 days supply | Qty: 30 | Fill #0

## 2019-05-29 ENCOUNTER — Other Ambulatory Visit: Payer: Self-pay | Admitting: Family Medicine

## 2019-05-29 DIAGNOSIS — I10 Essential (primary) hypertension: Secondary | ICD-10-CM

## 2019-05-29 MED FILL — LOSARTAN POTASSIUM 100 MG T: 100 | 90 days supply | Qty: 90 | Fill #0

## 2019-05-30 MED FILL — PENICILLIN VK 500 MG TABLET: 500 | 10 days supply | Qty: 40 | Fill #0

## 2019-06-01 DIAGNOSIS — Z20822 Contact with and (suspected) exposure to covid-19: Secondary | ICD-10-CM | POA: Diagnosis not present

## 2019-06-12 ENCOUNTER — Ambulatory Visit: Payer: 59 | Admitting: Family Medicine

## 2019-06-12 MED FILL — UNIFINE PENTIPS 31GX3/16: 31G X 5 MM | 90 days supply | Qty: 100 | Fill #2

## 2019-06-12 MED FILL — UNIFINE PENTIPS 31GX3/16": 31G X 5 MM | 90 days supply | Qty: 100 | Fill #2

## 2019-06-21 ENCOUNTER — Other Ambulatory Visit: Payer: Self-pay

## 2019-06-21 ENCOUNTER — Encounter: Payer: Self-pay | Admitting: Family Medicine

## 2019-06-21 MED ORDER — NEBIVOLOL HCL 10 MG PO TABS: 10.0000 mg | ORAL_TABLET | Freq: Every evening | ORAL | 3 refills | Status: DC

## 2019-06-21 MED FILL — FREESTYLE LITE TEST STRIP: 25 days supply | Qty: 100 | Fill #10

## 2019-06-21 MED FILL — LANTUS SOLOSTAR 100 UNITS/M: 100 | 75 days supply | Qty: 15 | Fill #2

## 2019-06-21 MED FILL — BYSTOLIC 10 MG TABLET: 10 | 90 days supply | Qty: 90 | Fill #0

## 2019-06-24 ENCOUNTER — Encounter: Payer: Self-pay | Admitting: Family Medicine

## 2019-06-25 MED FILL — ZOLPIDEM TARTRATE 5 MG TAB: 5 | 30 days supply | Qty: 30 | Fill #1

## 2019-07-05 MED FILL — metFORMIN HCL ER 500 MG TB2: 500 | 90 days supply | Qty: 360 | Fill #1

## 2019-07-05 MED FILL — BYDUREON BCise 2 MG/0.85ML: 2 | 84 days supply | Qty: 10 | Fill #2

## 2019-07-05 MED FILL — CLOPIDOGREL 75 MG TABLET: 75 | 90 days supply | Qty: 90 | Fill #1

## 2019-07-16 ENCOUNTER — Other Ambulatory Visit: Payer: Self-pay | Admitting: Family Medicine

## 2019-07-16 DIAGNOSIS — Z7184 Encounter for health counseling related to travel: Secondary | ICD-10-CM

## 2019-07-16 MED FILL — DOXYCYCLINE HYC 100 MG CAPS: 100 | 60 days supply | Qty: 60 | Fill #0

## 2019-07-17 LAB — HEPATIC FUNCTION PANEL
ALT: 19 (ref 10–40)
AST: 19 (ref 14–40)
Alkaline Phosphatase: 70 (ref 25–125)
Bilirubin, Total: 0.4

## 2019-07-17 LAB — BASIC METABOLIC PANEL
CO2: 22 (ref 13–22)
Chloride: 102 (ref 99–108)
Creatinine: 1.4 — AB (ref 0.6–1.3)
Glucose: 154
Potassium: 4.4 (ref 3.4–5.3)
Sodium: 139 (ref 137–147)

## 2019-07-17 LAB — TSH: TSH: 2.36 (ref 0.41–5.90)

## 2019-07-17 LAB — CBC AND DIFFERENTIAL
HCT: 42 (ref 41–53)
Hemoglobin: 14.4 (ref 13.5–17.5)
Platelets: 271 (ref 150–399)
WBC: 5.4

## 2019-07-17 LAB — HEMOGLOBIN A1C: Hemoglobin A1C: 7.3

## 2019-07-17 LAB — COMPREHENSIVE METABOLIC PANEL
Calcium: 9.9 (ref 8.7–10.7)
GFR calc non Af Amer: 52

## 2019-07-17 LAB — PSA: PSA: 2.7

## 2019-07-18 DIAGNOSIS — G4733 Obstructive sleep apnea (adult) (pediatric): Secondary | ICD-10-CM | POA: Diagnosis not present

## 2019-07-18 LAB — LIPID PANEL
Cholesterol: 142 (ref 0–200)
HDL: 55 (ref 35–70)
LDL Cholesterol: 57
Triglycerides: 182 — AB (ref 40–160)

## 2019-07-22 ENCOUNTER — Other Ambulatory Visit: Payer: Self-pay | Admitting: Family Medicine

## 2019-07-22 DIAGNOSIS — M1000 Idiopathic gout, unspecified site: Secondary | ICD-10-CM

## 2019-07-22 DIAGNOSIS — E119 Type 2 diabetes mellitus without complications: Secondary | ICD-10-CM

## 2019-07-22 MED FILL — INVOKANA 300 MG TABLET: 300 | 90 days supply | Qty: 90 | Fill #0

## 2019-07-22 MED FILL — ALLOPURINOL 300 MG TABS: 300 | 90 days supply | Qty: 90 | Fill #0

## 2019-07-23 MED FILL — ROSUVASTATIN CALCIUM 5 MG T: 5 | 90 days supply | Qty: 39 | Fill #3

## 2019-07-24 ENCOUNTER — Other Ambulatory Visit: Payer: Self-pay | Admitting: Family Medicine

## 2019-07-24 ENCOUNTER — Encounter: Payer: Self-pay | Admitting: Family Medicine

## 2019-07-24 DIAGNOSIS — N401 Enlarged prostate with lower urinary tract symptoms: Secondary | ICD-10-CM

## 2019-07-24 MED FILL — TAMSULOSIN HCL 0.4 MG CAP: 0.4 | 90 days supply | Qty: 90 | Fill #0

## 2019-07-26 MED FILL — ZOLPIDEM TARTRATE 5 MG TAB: 5 | 30 days supply | Qty: 30 | Fill #2

## 2019-08-22 MED FILL — LOSARTAN POTASSIUM 100 MG T: 100 | 90 days supply | Qty: 90 | Fill #1

## 2019-08-24 ENCOUNTER — Other Ambulatory Visit: Payer: Self-pay | Admitting: Family Medicine

## 2019-08-24 DIAGNOSIS — G4700 Insomnia, unspecified: Secondary | ICD-10-CM

## 2019-08-26 MED FILL — ZOLPIDEM TARTRATE 5 MG TAB: 5 | 30 days supply | Qty: 30 | Fill #0

## 2019-08-27 ENCOUNTER — Encounter: Payer: Self-pay | Admitting: Family Medicine

## 2019-08-28 ENCOUNTER — Encounter: Payer: Self-pay | Admitting: Neurology

## 2019-09-02 ENCOUNTER — Encounter: Payer: Self-pay | Admitting: Neurology

## 2019-09-02 ENCOUNTER — Other Ambulatory Visit: Payer: Self-pay

## 2019-09-02 ENCOUNTER — Ambulatory Visit (INDEPENDENT_AMBULATORY_CARE_PROVIDER_SITE_OTHER): Payer: 59 | Admitting: Neurology

## 2019-09-02 VITALS — BP 120/69 | HR 26 | Temp 97.3°F | Ht 72.0 in | Wt 233.0 lb

## 2019-09-02 DIAGNOSIS — D582 Other hemoglobinopathies: Secondary | ICD-10-CM | POA: Insufficient documentation

## 2019-09-02 DIAGNOSIS — Z9989 Dependence on other enabling machines and devices: Secondary | ICD-10-CM | POA: Diagnosis not present

## 2019-09-02 DIAGNOSIS — I472 Ventricular tachycardia, unspecified: Secondary | ICD-10-CM

## 2019-09-02 DIAGNOSIS — J449 Chronic obstructive pulmonary disease, unspecified: Secondary | ICD-10-CM | POA: Diagnosis not present

## 2019-09-02 DIAGNOSIS — G4733 Obstructive sleep apnea (adult) (pediatric): Secondary | ICD-10-CM

## 2019-09-02 DIAGNOSIS — I2510 Atherosclerotic heart disease of native coronary artery without angina pectoris: Secondary | ICD-10-CM | POA: Diagnosis not present

## 2019-09-02 DIAGNOSIS — E1140 Type 2 diabetes mellitus with diabetic neuropathy, unspecified: Secondary | ICD-10-CM | POA: Diagnosis not present

## 2019-09-02 NOTE — Progress Notes (Signed)
SLEEP MEDICINE CLINIC   Provider:  Larey Seat, M D  Referring Provider: Darreld Mclean, MD Primary Care Physician:  Darreld Mclean, MD  Chief Complaint  Patient presents with  . Follow-up    pt alone, rm 10. pt states that machine is working well, no concerns there. DME [employer]. would like to discuss ? r/t peripheral neuropathy.     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 Lorelei Pont for OSA , transfer of sleep care.   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  Set pressure is 11 cmH2O was 2 cm EPR and his residual AHI 3.3 most of the residuals are obstructive apneas in origin there is no Cheyne-Stokes respiration noted.  The Epworth sleepiness score was endorsed at 4 out of 24 points, the fatigue severity was noted at 16 out of 63. There are some days with weather high air leaks but they did not seem not to influence the apnea count negatively. He has an over 72 year old machine and this should be replaced.  He will come to have either a PSG or HST in order to reestablished a baseline.  She needs a new machine.       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 Lorelei Pont for OSA , transfer of sleep care. He is now seen I  A virtual visit for CPAP compliance. He has used a by now 71 year old machine that can be repleced as soon as he agrees to it, or needs it.   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  His average user time is 7 hours 10 minutes, set pressure for the air sense 10 AutoSet is 11  cmH2O pressure was 2 cm EPR, he does have air leakage the 95th percentile is 37 L/min but his events per hour are just 2.5 AHI.  Very few central apneas are emerging the residual apnea index is mostly consistent of hypopnea and obstructive sleep apnea.  The patient reports that he has noted some air leakage but usually he wakes up with a :-) on his CPAP display.  He sleeps well he reads in bed and is asleep very promptly once he puts the CPAP on he may have one nocturia rarely to and after each bathroom break is awake for 10 to 15 minutes.  He rises refreshed and restored but he feels physically stiff which may have other reasons and apnea.     The patient suffered an occipital stroke in 2014. He has a long history of migraines. He had a last sleep study in [place], by Dr. Scarlette Calico. 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 This remains worrisome for him as he should not take Triptan- medication since his presumed visual aura persisted for about 14 hours he was seen by one of his colleagues , Dr. Arlyss Queen, who initiated a stroke workup. Dr.  Sabine also suffers from diabetes and has a diabetic induced neuropathy on the bottom of both feet. He is still taking Onglyza,. He is seen today because he had not had a repeat sleep study for the last 12 years. He has been on the same setting and on the same type CPAP machine. He is interested in reevaluating his sleep apnea and treatment options.He recalls that about 8 years ago he was on a medical mission and used his original CPAP machine on a car battery and it broke. He replaced the  the same type however at the same settings.   Interval history from 08/12/2015 [name] is here after his recent sleep study performed on 05/13/2015, during which he was diagnosed again with sleep apnea at an AHI of 49.8 no REM sleep was noted during the diagnostic part, supine AHI was 54.8  and nonsupine 27.3 average end tidal CO2 was 30.64 torr, no prolonged oxygen desaturations were noted. Occasional premature ventricular beats were seen, the patient was titrated to CPAP and was doing well on 11 cm water pressure. A 2 cm flex function was requested. Today we also having  the opportunity to see the first download residual AHI is 2.6, at 11 cm water pressure with an average user time of 6 hours and 51 minutes and 100% compliance for days of use and over 4 hours of daily use. [name] would like an auto set function that can be used as he loses weight.  Sleep medical history and family sleep history: Brother on CPAP, his "sister should be".  Social history: married, no tobacco use ever, no ETOH , caffeine user - plain iced tea , 3-4 glasses a day.  Interval history from 08/11/2016, as the pleasure of meeting with [name] today for a yearly compliance visit the patient has again reached 97% compliance with an average user time of 6 hours and 57 minutes the pressure set at 11 cm water with 2 cm EPR the residual AHI is 3.0 there are some air leaks but no major ones, there is no evidence of treatment emergent central apneas. He likes his machine and he feels that he is benefiting his sleep quality.  He has a new grand child.   Interval 08-30-2017, at the pleasure of meeting Ms. Dr. Shanon [facility] today for his regularly CPAP compliance.  The patient recently went through a mission in [place] and hemostasis was able to use his CPAP.  However he has noted that when electricity was not provided or available to run his machine he was also not recharging his batteries.  He is able to sleep much better on the CPAP and would like to be able to use a CPAP on prolonged flights areas.  We will discuss a travel CPAP may be a refurbished travel CPAP that would be cheaper to purchase. His compliance download is as usual, excellent.  His machine is set at 11 cmH2O was 2 cm EPR, his residual AHI was 3.3 there  are some moderate air leaks, use of time on average was 6 hours and 46 minutes 100% of days and 29 of these days over 4 hours consecutive views were documented establishing a compliance of 97%.  Review of Systems: Out of a complete 14 system review, the patient complains of only the following symptoms, and all other reviewed systems are negative.  The fatigue severity score was today endorsed at 13points and the Epworth sleepiness score at 7 points. 0 points were endorsed on the  15 point geriatric depression score.  Total knee replacement 4 years ago. Arthroscopic left knee 15 years ago. Knee pain while gardening, golf not affected.  Neck pain, especially on long flights.     Social History   Socioeconomic History  . Marital status: Married    Spouse name: Learta Codding  . Number of children: 4  . Years of education: 50  . Highest education level: Not on file  Occupational History  . Occupation: MD    Employer: [employer]  Tobacco Use  . Smoking status: Never Smoker  . Smokeless tobacco: Never Used  Substance and Sexual Activity  . Alcohol use: No    Alcohol/week: 0.0 standard drinks  . Drug use: No  . Sexual activity: Not on file  Other Topics Concern  . Not on file  Social History Narrative   Patient lives at home with family.   Caffeine Use: occasionally tea   Social Determinants of Health   Financial Resource Strain:   . Difficulty of Paying Living Expenses:   Food Insecurity:   . Worried About Charity fundraiser in the Last Year:   . Arboriculturist in the Last Year:   Transportation Needs:   . Film/video editor (Medical):   [name] Lack of Transportation (Non-Medical):   Physical Activity:   . Days of Exercise per Week:   . Minutes of Exercise per Session:   Stress:   . Feeling of Stress :   Social Connections:   . Frequency of Communication with Friends and Family:   . Frequency of Social Gatherings with Friends and Family:   . Attends Religious Services:   .  Active Member of Clubs or Organizations:   . Attends Archivist Meetings:   [name] Marital Status:   Intimate Partner Violence:   . Fear of Current or Ex-Partner:   . Emotionally Abused:   [name] Physically Abused:   . Sexually Abused:     Family History  Problem Relation Age of Onset  . Cancer Mother        Breast  . Stroke Mother   . Stroke Maternal Grandmother   . Stroke Maternal Aunt   . Stroke Maternal Uncle     Past Medical History:  Diagnosis Date  . Anemia   . Arthritis   . Diabetes mellitus   . Gout   . Hyperlipidemia   . Hypertension   . Localized osteoarthritis of left knee 11/20/2012  . Migraine headache with aura   . Neuromuscular disorder ([place])    " mild periferal neauropathy  . Osteoarthritis of left hip 11/20/2012  . PVC (premature ventricular contraction)   . Sleep apnea    cpap    Past Surgical History:  Procedure Laterality Date  . CHOLECYSTECTOMY  1997  . CVA     CVA, occiptal  . CVA     CVA, occipital  . knee arthoscopy  2006   Left Knee  . LEFT HEART CATH AND CORONARY ANGIOGRAPHY N/A 12/07/2017   Procedure: LEFT HEART CATH AND CORONARY ANGIOGRAPHY;  Surgeon: Troy Sine, MD;  Location: [facility];  Service: Cardiovascular;  Laterality: N/A;  . left total hip    . left unicarpmental knee    . LOOP RECORDER IMPLANT N/A 04/19/2013   Procedure: LOOP RECORDER IMPLANT;  Surgeon: Evans Lance, MD;  Location: [facility] CATH LAB;  Service: Cardiovascular;  Laterality: N/A;  . PARTIAL KNEE ARTHROPLASTY Left 11/20/2012   Procedure: UNICOMPARTMENTAL KNEE;  Surgeon: Johnny Bridge, MD;  Location: [facility];  Service: Orthopedics;  Laterality: Left;  . REPLACEMENT UNICONDYLAR JOINT KNEE Left 11/20/2012   Dr Mardelle Matte  . TEE WITHOUT CARDIOVERSION N/A 04/19/2013   Procedure: TRANSESOPHAGEAL ECHOCARDIOGRAM (TEE);  Surgeon: Larey Dresser, MD;  Location: [facility];  Service: Cardiovascular;  Laterality: N/A;  . TONSILLECTOMY AND ADENOIDECTOMY      Childhood  . TOTAL HIP ARTHROPLASTY Left 11/20/2012   Dr Mardelle Matte  . TOTAL HIP ARTHROPLASTY Left 11/20/2012   Procedure: TOTAL HIP ARTHROPLASTY;  Surgeon: Johnny Bridge, MD;  Location: [facility];  Service: Orthopedics;  Laterality: Left;    Current Outpatient Medications  Medication Sig Dispense Refill  . acetaminophen (TYLENOL) 650 MG CR tablet Take 1,300 mg by mouth every 8 (eight) hours as needed for pain.    [name] acetic acid-hydrocortisone (VOSOL-HC) OTIC solution Place 3 drops into both ears 3 (three) times daily as needed. 10 mL 1  . acetic acid-hydrocortisone (VOSOL-HC) OTIC solution Place 3 drops into both ears 3 (three) times daily as needed. 10 mL 1  . Alcohol Swabs PADS Use to test up to 4 times daily 100 each 2  . allopurinol (ZYLOPRIM) 300 MG tablet TAKE 1 TABLET BY MOUTH ONCE DAILY 90 tablet 1  . blood glucose meter kit and supplies KIT Use up to four times daily as directed. Dx E13.40. 1 each 0  . Blood Glucose Monitoring Suppl (FREESTYLE LITE) DEVI Use to test up to 4 times daily 1 each 0  . clopidogrel (PLAVIX) 75 MG tablet TAKE 1 TABLET BY MOUTH DAILY WITH BREAKFAST. 90 tablet 1  . diclofenac sodium (VOLTAREN) 1 % GEL Apply 1 application topically 2 (two) times daily as needed (for pain).    . Exenatide ER (BYDUREON BCISE) 2 MG/0.85ML AUIJ Inject 2 mg into the skin once a week. 12 pen 3  . glucose blood (FREESTYLE LITE) test strip USE TO TEST UP TO 4 TIMES DAILY 100 each 12  . Insulin Glargine (LANTUS SOLOSTAR) 100 UNIT/ML Solostar Pen Inject 20 Units into the skin daily. Titrate as needed (Patient taking differently: Inject 22 Units into the skin daily. Titrate as needed) 10 pen 6  . Insulin Pen Needle (PEN NEEDLES) 32G X 5 MM MISC 1 each by Does not apply route daily. Use for basal insulin injection 100 each 6  . INVOKANA 300 MG TABS tablet TAKE 1 TABLET BY MOUTH ONCE DAILY 90 tablet 1  . losartan (COZAAR) 100 MG tablet TAKE 1 TABLET BY MOUTH DAILY. 90 tablet 3  . metaxalone  (SKELAXIN) 800 MG tablet Take 1 tablet (800 mg total) by mouth every 6 (six) hours as needed. (Patient taking differently: Take 800 mg by mouth every 6 (six) hours as needed for muscle spasms. ) 60 tablet 1  . metFORMIN (GLUCOPHAGE-XR) 500 MG 24 hr tablet TAKE 2 TABLETS BY MOUTH 2 TIMES DAILY. 360 tablet 1  . nebivolol (BYSTOLIC) 10 MG tablet Take 1 tablet (10 mg total) by mouth every evening. 90 tablet 3  . rosuvastatin (CRESTOR) 5 MG tablet Take 1 tablet (5 mg total) by mouth every Monday, Wednesday, and Friday. 39 tablet 4  . SM LANCETS 33G MISC USE TO TEST BLOOD SUGAR TWICE DAILY 400 each PRN  . tamsulosin (FLOMAX) 0.4 MG CAPS capsule TAKE 1 CAPSULE BY MOUTH ONCE DAILY 90 capsule 3  . zolpidem (AMBIEN) 5 MG tablet TAKE 1 TABLET BY MOUTH AT BEDTIME AS NEEDED FOR SLEEP 30 tablet 2  No current facility-administered medications for this visit.    Allergies as of 09/02/2019 - Review Complete 09/02/2019  Allergen Reaction Noted  . Sulfa antibiotics Other (See Comments) 06/01/2011    Vitals: BP 120/69   Pulse (!) 26   Temp (!) 97.3 F (36.3 C)   Ht 6' (1.829 m)   Wt 233 lb (105.7 kg)   BMI 31.60 kg/m  Last Weight:  Wt Readings from Last 1 Encounters:  09/02/19 233 lb (105.7 kg)   JKD:TOIZ mass index is 31.6 kg/m.     Last Height:   Ht Readings from Last 1 Encounters:  09/02/19 6' (1.829 m)   Well groomed- mo ankle edema, General: The patient is awake, alert and appears not in acute distress. The patient is well groomed. Head: Normocephalic, atraumatic. Neck is supple.  Mallampati 1, the airway is short and ends abruptly is not narrow.  The palate is of normal shape the uvula is midline the tongue is midline without fasciculation or tremor. neck circumference: 17. 5"  Nasal airflow unrestricted , Mild Retrognathia is seen.  Neurologic exam : The patient is awake and alert, oriented to place and time.   Attention span & concentration ability appears normal.  Speech is fluent,   without dysarthria, dysphonia or aphasia. He is speaking as if short of breath.   Mood and affect are appropriate. Cranial nerves: no loss of smell or taste.  Pupils are equal -Facial motor strength is symmetric and tongue and uvula move midline. Shoulder shrug was symmetrical.  Sensory: intact touch and vibration.  Deep tendon reflexes: in the  upper and lower extremities are symmetric and intact. Babinski up-going, and painful! Hypersensitive planta pedis.   A/P:     1) Assesment ; OSA on CPAP, highly compliant user, needs a new machine and new baseline.   2) Diabetic neuropathy with hyperpathia in both feet, preserved vibration and fine touch. He is not interested in any gabapentin.     Follow Up Instructions: He has an over 82 year old CPAP machine and this should be replaced.  He will come to have either a PSG or HST in order to re-established a baseline.   he needs a new machine.    I discussed the assessment and treatment plan with the patient. The patient was provided an opportunity to ask questions and all were answered. The patient agreed with the plan and demonstrated an understanding of the instructions.   The patient was advised to call back or seek an in-person evaluation if the symptoms worsen or if the condition fails to improve as anticipated.  I provided 22 minutes of non-face-to-face time during this encounter.   Larey Seat, MD  09/18/2019   CC: Darreld Mclean, Md [address],  [place] [address]   CC: Dr Carola Frost

## 2019-09-02 NOTE — Patient Instructions (Signed)

## 2019-09-18 ENCOUNTER — Encounter: Payer: Self-pay | Admitting: Neurology

## 2019-09-22 NOTE — Progress Notes (Deleted)
[facility] [address] [place], [place] [address] [phone]  Date:  09/25/2019   Name:  Phillip [name]   DOB:  02-04-1951   MRN:  [id number]  PCP:  Darreld Mclean, MD    Chief Complaint: No chief complaint on file.   History of Present Illness:  Phillip [name] is a 69 y.o. 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, history of diabetes type 2 with neuropathy, OSA on CPAP, hypertension, CAD, hyperlipidemia, gout, history of ischemic stroke 2014 without residual deficits Was started on Lantus last year due to difficulty with A1c control; A1c has improved from 8.1-7.3 most recently  Married to [place], he has 4 children ([place], [place], Jana Half) and several grandchildren Last seen by myself in January of this year  COVID-19 series [facility] exam up-to-date A1c done in March Foot exam up-to-date Colon cancer screening current  He recently saw Dr Brett Fairy for sleep apnea visit-they plan to do a sleep study as it has been quite some time   Lab Results  Component Value Date   HGBA1C 7.3 07/17/2019     Patient Active Problem List   Diagnosis Date Noted  . Type 2 diabetes mellitus with diabetic neuropathy, without long-term current use of insulin ([place]) 09/02/2019  . OSA and COPD overlap syndrome ([place]) 09/02/2019  . Abnormal hemoglobin ([place]) 09/02/2019  . OSA on CPAP 08/12/2015  . Ventricular tachycardia ([place]) 11/10/2014  . CAD (coronary artery disease), native coronary artery   . Variants of migraine, not elsewhere classified, without mention of intractable migraine without mention of status migrainosus 08/14/2013  . History of ischemic stroke without residual deficits 04/17/2013  . ED (erectile dysfunction) 02/01/2012  . Gout 09/05/2011  . Hyperlipidemia 09/05/2011  . Hypertriglyceridemia 09/05/2011  . PVC (premature ventricular  contraction) 09/05/2011  . Type 2 diabetes, controlled, with neuropathy ([place]) 06/01/2011  . Essential hypertension, benign 06/01/2011    Past Medical History:  Diagnosis Date  . Anemia   . Arthritis   . Diabetes mellitus   . Gout   . Hyperlipidemia   . Hypertension   . Localized osteoarthritis of left knee 11/20/2012  . Migraine headache with aura   . Neuromuscular disorder ([place])    " mild periferal neauropathy  . Osteoarthritis of left hip 11/20/2012  . PVC (premature ventricular contraction)   . Sleep apnea    cpap    Past Surgical History:  Procedure Laterality Date  . CHOLECYSTECTOMY  1997  . CVA     CVA, occiptal  . CVA     CVA, occipital  . knee arthoscopy  2006   Left Knee  . LEFT HEART CATH AND CORONARY ANGIOGRAPHY N/A 12/07/2017   Procedure: LEFT HEART CATH AND CORONARY ANGIOGRAPHY;  Surgeon: Troy Sine, MD;  Location: [facility];  Service: Cardiovascular;  Laterality: N/A;  . left total hip    . left unicarpmental knee    . LOOP RECORDER IMPLANT N/A 04/19/2013   Procedure: LOOP RECORDER IMPLANT;  Surgeon: Evans Lance, MD;  Location: [facility] CATH LAB;  Service: Cardiovascular;  Laterality: N/A;  . PARTIAL KNEE ARTHROPLASTY Left 11/20/2012   Procedure: UNICOMPARTMENTAL KNEE;  Surgeon: Johnny Bridge, MD;  Location: [facility];  Service: Orthopedics;  Laterality: Left;  . REPLACEMENT UNICONDYLAR JOINT KNEE Left 11/20/2012   Dr Mardelle Matte  . TEE WITHOUT CARDIOVERSION N/A  04/19/2013   Procedure: TRANSESOPHAGEAL ECHOCARDIOGRAM (TEE);  Surgeon: Larey Dresser, MD;  Location: [facility];  Service: Cardiovascular;  Laterality: N/A;  . TONSILLECTOMY AND ADENOIDECTOMY     Childhood  . TOTAL HIP ARTHROPLASTY Left 11/20/2012   Dr Mardelle Matte  . TOTAL HIP ARTHROPLASTY Left 11/20/2012   Procedure: TOTAL HIP ARTHROPLASTY;  Surgeon: Johnny Bridge, MD;  Location: [facility];  Service: Orthopedics;  Laterality: Left;    Social History   Tobacco Use  . Smoking status: Never  Smoker  . Smokeless tobacco: Never Used  Substance Use Topics  . Alcohol use: No    Alcohol/week: 0.0 standard drinks  . Drug use: No    Family History  Problem Relation Age of Onset  . Cancer Mother        Breast  . Stroke Mother   . Stroke Maternal Grandmother   . Stroke Maternal Aunt   . Stroke Maternal Uncle     Allergies  Allergen Reactions  . Sulfa Antibiotics Other (See Comments)    Unknown reaction    Medication list has been reviewed and updated.  Current Outpatient Medications on File Prior to Visit  Medication Sig Dispense Refill  . acetaminophen (TYLENOL) 650 MG CR tablet Take 1,300 mg by mouth every 8 (eight) hours as needed for pain.    [name] acetic acid-hydrocortisone (VOSOL-HC) OTIC solution Place 3 drops into both ears 3 (three) times daily as needed. 10 mL 1  . acetic acid-hydrocortisone (VOSOL-HC) OTIC solution Place 3 drops into both ears 3 (three) times daily as needed. 10 mL 1  . Alcohol Swabs PADS Use to test up to 4 times daily 100 each 2  . allopurinol (ZYLOPRIM) 300 MG tablet TAKE 1 TABLET BY MOUTH ONCE DAILY 90 tablet 1  . blood glucose meter kit and supplies KIT Use up to four times daily as directed. Dx E13.40. 1 each 0  . Blood Glucose Monitoring Suppl (FREESTYLE LITE) DEVI Use to test up to 4 times daily 1 each 0  . clopidogrel (PLAVIX) 75 MG tablet TAKE 1 TABLET BY MOUTH DAILY WITH BREAKFAST. 90 tablet 1  . diclofenac sodium (VOLTAREN) 1 % GEL Apply 1 application topically 2 (two) times daily as needed (for pain).    . Exenatide ER (BYDUREON BCISE) 2 MG/0.85ML AUIJ Inject 2 mg into the skin once a week. 12 pen 3  . glucose blood (FREESTYLE LITE) test strip USE TO TEST UP TO 4 TIMES DAILY 100 each 12  . Insulin Glargine (LANTUS SOLOSTAR) 100 UNIT/ML Solostar Pen Inject 20 Units into the skin daily. Titrate as needed (Patient taking differently: Inject 22 Units into the skin daily. Titrate as needed) 10 pen 6  . Insulin Pen Needle (PEN NEEDLES) 32G X  5 MM MISC 1 each by Does not apply route daily. Use for basal insulin injection 100 each 6  . INVOKANA 300 MG TABS tablet TAKE 1 TABLET BY MOUTH ONCE DAILY 90 tablet 1  . losartan (COZAAR) 100 MG tablet TAKE 1 TABLET BY MOUTH DAILY. 90 tablet 3  . metaxalone (SKELAXIN) 800 MG tablet Take 1 tablet (800 mg total) by mouth every 6 (six) hours as needed. (Patient taking differently: Take 800 mg by mouth every 6 (six) hours as needed for muscle spasms. ) 60 tablet 1  . metFORMIN (GLUCOPHAGE-XR) 500 MG 24 hr tablet TAKE 2 TABLETS BY MOUTH 2 TIMES DAILY. 360 tablet 1  . nebivolol (BYSTOLIC) 10 MG tablet Take 1 tablet (10 mg total)  by mouth every evening. 90 tablet 3  . rosuvastatin (CRESTOR) 5 MG tablet Take 1 tablet (5 mg total) by mouth every Monday, Wednesday, and Friday. 39 tablet 4  . SM LANCETS 33G MISC USE TO TEST BLOOD SUGAR TWICE DAILY 400 each PRN  . tamsulosin (FLOMAX) 0.4 MG CAPS capsule TAKE 1 CAPSULE BY MOUTH ONCE DAILY 90 capsule 3  . zolpidem (AMBIEN) 5 MG tablet TAKE 1 TABLET BY MOUTH AT BEDTIME AS NEEDED FOR SLEEP 30 tablet 2   No current facility-administered medications on file prior to visit.    Review of Systems:  As per HPI- otherwise negative.   Physical Examination: There were no vitals filed for this visit. There were no vitals filed for this visit. There is no height or weight on file to calculate BMI. Ideal Body Weight:    GEN: no acute distress. HEENT: Atraumatic, Normocephalic.  Ears and Nose: No external deformity. CV: RRR, No M/G/R. No JVD. No thrill. No extra heart sounds. PULM: CTA B, no wheezes, crackles, rhonchi. No retractions. No resp. distress. No accessory muscle use. ABD: S, NT, ND, +BS. No rebound. No HSM. EXTR: No c/c/e PSYCH: Normally interactive. Conversant.    Assessment and Plan: *** Moderate medical decision making today  This visit occurred during the SARS-CoV-2 public health emergency.  Safety protocols were in place, including  screening questions prior to the visit, additional usage of staff PPE, and extensive cleaning of exam room while observing appropriate contact time as indicated for disinfecting solutions.    Signed Lamar Blinks, MD

## 2019-09-23 ENCOUNTER — Other Ambulatory Visit: Payer: Self-pay | Admitting: Family Medicine

## 2019-09-23 ENCOUNTER — Encounter: Payer: Self-pay | Admitting: Family Medicine

## 2019-09-23 MED FILL — BASAGLAR 100 UNIT/ML KWIKPE: 100 | 81 days supply | Qty: 18 | Fill #0

## 2019-09-23 MED FILL — ZOLPIDEM TARTRATE 5 MG TAB: 5 | 30 days supply | Qty: 30 | Fill #1

## 2019-09-23 MED FILL — BYDUREON BCise 2 MG/0.85ML: 2 | 84 days supply | Qty: 10 | Fill #3

## 2019-09-24 ENCOUNTER — Encounter: Payer: Self-pay | Admitting: Family Medicine

## 2019-09-25 ENCOUNTER — Ambulatory Visit (INDEPENDENT_AMBULATORY_CARE_PROVIDER_SITE_OTHER): Payer: 59 | Admitting: Family Medicine

## 2019-09-25 DIAGNOSIS — Z538 Procedure and treatment not carried out for other reasons: Secondary | ICD-10-CM

## 2019-09-26 ENCOUNTER — Ambulatory Visit: Payer: 59 | Admitting: Family Medicine

## 2019-10-03 ENCOUNTER — Other Ambulatory Visit: Payer: Self-pay | Admitting: Family Medicine

## 2019-10-03 DIAGNOSIS — Z8673 Personal history of transient ischemic attack (TIA), and cerebral infarction without residual deficits: Secondary | ICD-10-CM

## 2019-10-03 DIAGNOSIS — E119 Type 2 diabetes mellitus without complications: Secondary | ICD-10-CM

## 2019-10-03 MED FILL — metFORMIN HCL ER 500 MG TB2: 500 | 90 days supply | Qty: 360 | Fill #0

## 2019-10-03 MED FILL — CLOPIDOGREL 75 MG TABLET: 75 | 90 days supply | Qty: 90 | Fill #0

## 2019-10-06 NOTE — Progress Notes (Signed)
[place] at [facility], [place], [place] [address] [phone]  Date:  10/09/2019   Name:  Phillip [name]   DOB:  19-Aug-1950   MRN:  [id number]  PCP:  Darreld Mclean, MD    Chief Complaint: Diabetes (follow up), Cryotherapy, and Arthritis (aching in hands, increase pain)   History of Present Illness:  Phillip [name] is a 69 y.o. 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  They also had a big family reunion picnic over the weekend He is battling his weight still- he was down to 220 6 months ago, he trying to walk and eat well but this is always a struggle  Seen by his neurologist, Dr. Brett Fairy last month to follow-up on CPAP-they plan to do a new sleep study and get him a replacement CPAP machine  COVID-19 series- done Shingrix- mentioned today, he is still thinking about this, he had a very unpleasant localized reaction to Zostavax previously  Phillip [name] is also noted more trouble with arthritis in his right hand, his long finger is the most problematic, and also the base of the thumb.  He may have pain and stiffness when trying to use his computer at work  Lab Results  Component Value Date   HGBA1C 7.3 07/17/2019   Allopurinol Plavix Bydureon Basaglar- 22 units. No low sugars  Invokana Losartan Skelaxin Metformin Bystolic Crestor Flomax Ambien  Wt Readings from Last 3 Encounters:  10/09/19 230 lb (104.3 kg)  09/02/19 233 lb (105.7  kg)  05/23/19 231 lb (104.8 kg)   He has a few seborrheic keratoses he would like frozen today Will be traveling to [place] soon on a medical mission, needs malaria prophylaxis; doxycycline  Patient Active Problem List   Diagnosis Date Noted  . Type 2 diabetes mellitus with diabetic neuropathy, without long-term current use of insulin ([place]) 09/02/2019  . OSA and COPD overlap syndrome ([place]) 09/02/2019  . Abnormal hemoglobin ([place]) 09/02/2019  . OSA on CPAP 08/12/2015  . Ventricular tachycardia ([place]) 11/10/2014  . CAD (coronary artery disease), native coronary artery   . Variants of migraine, not elsewhere classified, without mention of intractable migraine without mention of status migrainosus 08/14/2013  . History of ischemic stroke without residual deficits 04/17/2013  . ED (erectile dysfunction) 02/01/2012  . Gout 09/05/2011  . Hyperlipidemia 09/05/2011  . Hypertriglyceridemia 09/05/2011  . PVC (premature ventricular contraction) 09/05/2011  . Type 2 diabetes, controlled, with neuropathy ([place]) 06/01/2011  . Essential hypertension, benign 06/01/2011    Past Medical History:  Diagnosis Date  . Anemia   . Arthritis   . Diabetes mellitus   . Gout   . Hyperlipidemia   . Hypertension   . Localized osteoarthritis of left knee 11/20/2012  . Migraine headache with aura   . Neuromuscular disorder ([place])    " mild periferal neauropathy  . Osteoarthritis of left hip 11/20/2012  . PVC (premature ventricular contraction)   . Sleep apnea    cpap  Past Surgical History:  Procedure Laterality Date  . CHOLECYSTECTOMY  1997  . CVA     CVA, occiptal  . CVA     CVA, occipital  . knee arthoscopy  2006   Left Knee  . LEFT HEART CATH AND CORONARY ANGIOGRAPHY N/A 12/07/2017   Procedure: LEFT HEART CATH AND CORONARY ANGIOGRAPHY;  Surgeon: Troy Sine, MD;  Location: [place] CV LAB;  Service: Cardiovascular;  Laterality: N/A;  . left total hip    . left unicarpmental knee    . LOOP  RECORDER IMPLANT N/A 04/19/2013   Procedure: LOOP RECORDER IMPLANT;  Surgeon: Evans Lance, MD;  Location: [facility] CATH LAB;  Service: Cardiovascular;  Laterality: N/A;  . PARTIAL KNEE ARTHROPLASTY Left 11/20/2012   Procedure: UNICOMPARTMENTAL KNEE;  Surgeon: Johnny Bridge, MD;  Location: [facility];  Service: Orthopedics;  Laterality: Left;  . REPLACEMENT UNICONDYLAR JOINT KNEE Left 11/20/2012   Dr Mardelle Matte  . TEE WITHOUT CARDIOVERSION N/A 04/19/2013   Procedure: TRANSESOPHAGEAL ECHOCARDIOGRAM (TEE);  Surgeon: Larey Dresser, MD;  Location: [facility];  Service: Cardiovascular;  Laterality: N/A;  . TONSILLECTOMY AND ADENOIDECTOMY     Childhood  . TOTAL HIP ARTHROPLASTY Left 11/20/2012   Dr Mardelle Matte  . TOTAL HIP ARTHROPLASTY Left 11/20/2012   Procedure: TOTAL HIP ARTHROPLASTY;  Surgeon: Johnny Bridge, MD;  [facility];  Service: Orthopedics;  Laterality: Left;    Social History   Tobacco Use  . Smoking status: Never Smoker  . Smokeless tobacco: Never Used  Substance Use Topics  . Alcohol use: No    Alcohol/week: 0.0 standard drinks  . Drug use: No    Family History  Problem Relation Age of Onset  . Cancer Mother        Breast  . Stroke Mother   . Stroke Maternal Grandmother   . Stroke Maternal Aunt   . Stroke Maternal Uncle     Allergies  Allergen Reactions  . Sulfa Antibiotics Other (See Comments)    Unknown reaction    Medication list has been reviewed and updated.  Current Outpatient Medications on File Prior to Visit  Medication Sig Dispense Refill  . acetaminophen (TYLENOL) 650 MG CR tablet Take 1,300 mg by mouth every 8 (eight) hours as needed for pain.    . Alcohol Swabs PADS Use to test up to 4 times daily 100 each 2  . allopurinol (ZYLOPRIM) 300 MG tablet TAKE 1 TABLET BY MOUTH ONCE DAILY 90 tablet 1  . blood glucose meter kit and supplies KIT Use up to four times daily as directed. Dx E13.40. 1 each 0  . Blood Glucose Monitoring Suppl (FREESTYLE LITE) DEVI  Use to test up to 4 times daily 1 each 0  . clopidogrel (PLAVIX) 75 MG tablet TAKE 1 TABLET BY MOUTH DAILY WITH BREAKFAST. 90 tablet 1  . diclofenac sodium (VOLTAREN) 1 % GEL Apply 1 application topically 2 (two) times daily as needed (for pain).    . Exenatide ER (BYDUREON BCISE) 2 MG/0.85ML AUIJ Inject 2 mg into the skin once a week. 12 pen 3  . glucose blood (FREESTYLE LITE) test strip USE TO TEST UP TO 4 TIMES DAILY 100 each 12  . Insulin Glargine (BASAGLAR KWIKPEN) 100 UNIT/ML Inject 0.22 mLs (22 Units total) into the skin daily. 15 mL 9  . Insulin Pen Needle (PEN NEEDLES) 32G X 5 MM MISC 1 each by Does not apply route daily. Use for basal insulin injection 100 each 6  .  INVOKANA 300 MG TABS tablet TAKE 1 TABLET BY MOUTH ONCE DAILY 90 tablet 1  . losartan (COZAAR) 100 MG tablet TAKE 1 TABLET BY MOUTH DAILY. 90 tablet 3  . metaxalone (SKELAXIN) 800 MG tablet Take 1 tablet (800 mg total) by mouth every 6 (six) hours as needed. (Patient taking differently: Take 800 mg by mouth every 6 (six) hours as needed for muscle spasms. ) 60 tablet 1  . metFORMIN (GLUCOPHAGE-XR) 500 MG 24 hr tablet TAKE 2 TABLETS BY MOUTH 2 TIMES DAILY. 360 tablet 1  . nebivolol (BYSTOLIC) 10 MG tablet Take 1 tablet (10 mg total) by mouth every evening. 90 tablet 3  . rosuvastatin (CRESTOR) 5 MG tablet Take 1 tablet (5 mg total) by mouth every Monday, Wednesday, and Friday. 39 tablet 4  . SM LANCETS 33G MISC USE TO TEST BLOOD SUGAR TWICE DAILY 400 each PRN  . tamsulosin (FLOMAX) 0.4 MG CAPS capsule TAKE 1 CAPSULE BY MOUTH ONCE DAILY 90 capsule 3  . zolpidem (AMBIEN) 5 MG tablet TAKE 1 TABLET BY MOUTH AT BEDTIME AS NEEDED FOR SLEEP 30 tablet 2   No current facility-administered medications on file prior to visit.    Review of Systems:  As per HPI- otherwise negative.   Physical Examination: Vitals:   10/09/19 0838  BP: 132/80  Pulse: 61  Resp: 17  SpO2: 99%   Vitals:   10/09/19 0838  Weight: 230 lb (104.3 kg)   Height: 6' (1.829 m)   Body mass index is 31.19 kg/m. Ideal Body Weight: Weight in (lb) to have BMI = 25: 183.9  GEN: no acute distress.  Overweight looks well HEENT: Atraumatic, Normocephalic.  Ears and Nose: No external deformity. CV: RRR, No M/G/R. No JVD. No thrill. No extra heart sounds. PULM: CTA B, no wheezes, crackles, rhonchi. No retractions. No resp. distress. No accessory muscle use. EXTR: No c/c/e PSYCH: Normally interactive. Conversant.  There is some stiffness and joint thickening at the right first MCP, and also lack of mobility in the IP joints of the right long finger No redness or heat  Verbal consent obtained.  Liquid nitrogen used to treat approximately 9 seborrheic keratoses on the head and torso.  Each lesion treated 3 times.  Patient tolerated well without any immediate complications   Assessment and Plan: Controlled type 2 diabetes mellitus without complication, without long-term current use of insulin ([place]) - Plan: Hemoglobin A1c  Essential hypertension  Mixed hyperlipidemia  Benign prostatic hyperplasia with urinary frequency  OSA on CPAP  Right hand pain - Plan: Sedimentation rate, C-reactive protein, Rheumatoid Factor, Cyclic citrul peptide antibody, IgG ([facility])  Need for malaria prophylaxis - Plan: doxycycline (VIBRAMYCIN) 100 MG capsule  Seborrheic keratosis  Here today for a follow-up visit A1c is pending Blood pressure is under control Phillip [name] continues to work on diet and exercise Concern of acceleration of her right hand arthritis.  We will do lab work to rule out autoimmune process.  Patient plans to have hand films done at [facility] for possible Refill doxycycline for malaria prophylaxis Moderate medical decision making today- Will plan further follow- up pending labs.  This visit occurred during the SARS-CoV-2 public health emergency.  Safety protocols were in place, including screening questions prior to the visit, additional usage of  staff PPE, and extensive cleaning of exam room while observing appropriate contact time as indicated for disinfecting solutions.    Signed Lamar Blinks, MD

## 2019-10-09 ENCOUNTER — Ambulatory Visit: Payer: 59 | Admitting: Family Medicine

## 2019-10-09 ENCOUNTER — Encounter: Payer: Self-pay | Admitting: Family Medicine

## 2019-10-09 ENCOUNTER — Other Ambulatory Visit: Payer: Self-pay

## 2019-10-09 VITALS — BP 132/80 | HR 61 | Resp 17 | Ht 72.0 in | Wt 230.0 lb

## 2019-10-09 DIAGNOSIS — R350 Frequency of micturition: Secondary | ICD-10-CM | POA: Diagnosis not present

## 2019-10-09 DIAGNOSIS — G4733 Obstructive sleep apnea (adult) (pediatric): Secondary | ICD-10-CM

## 2019-10-09 DIAGNOSIS — N401 Enlarged prostate with lower urinary tract symptoms: Secondary | ICD-10-CM

## 2019-10-09 DIAGNOSIS — Z9989 Dependence on other enabling machines and devices: Secondary | ICD-10-CM

## 2019-10-09 DIAGNOSIS — Z298 Encounter for other specified prophylactic measures: Secondary | ICD-10-CM | POA: Diagnosis not present

## 2019-10-09 DIAGNOSIS — M79641 Pain in right hand: Secondary | ICD-10-CM | POA: Diagnosis not present

## 2019-10-09 DIAGNOSIS — L821 Other seborrheic keratosis: Secondary | ICD-10-CM | POA: Diagnosis not present

## 2019-10-09 DIAGNOSIS — E119 Type 2 diabetes mellitus without complications: Secondary | ICD-10-CM | POA: Diagnosis not present

## 2019-10-09 DIAGNOSIS — E782 Mixed hyperlipidemia: Secondary | ICD-10-CM

## 2019-10-09 DIAGNOSIS — I10 Essential (primary) hypertension: Secondary | ICD-10-CM

## 2019-10-09 LAB — SEDIMENTATION RATE: Sed Rate: 12 mm/hr (ref 0–20)

## 2019-10-09 LAB — C-REACTIVE PROTEIN: CRP: <1 mg/dL (ref 0.5–20.0)

## 2019-10-09 LAB — HEMOGLOBIN A1C: Hgb A1c MFr Bld: 8.2 % — ABNORMAL HIGH (ref 4.6–6.5)

## 2019-10-09 MED ORDER — DOXYCYCLINE HYCLATE 100 MG PO CAPS: 100.0000 mg | ORAL_CAPSULE | Freq: Every day | ORAL | 0 refills | Status: DC

## 2019-10-09 NOTE — Patient Instructions (Addendum)
It was great to see you again today- have a wonderful time on your upcoming trip to [facility]!   I will be in touch with your labs  If Aaron Edelman can't do your x-ray I can order it for [facility] for you

## 2019-10-10 LAB — CYCLIC CITRUL PEPTIDE ANTIBODY, IGG: Cyclic Citrullin Peptide Ab: <16 UNITS

## 2019-10-10 LAB — RHEUMATOID FACTOR: Rheumatoid fact SerPl-aCnc: <14 IU/mL (ref <14)

## 2019-10-14 MED FILL — INVOKANA 300 MG TABLET: 300 | 90 days supply | Qty: 90 | Fill #1

## 2019-10-14 MED FILL — ALLOPURINOL 300 MG TABS: 300 | 90 days supply | Qty: 90 | Fill #1

## 2019-10-16 MED FILL — TAMSULOSIN HCL 0.4 MG CAP: 0.4 | 90 days supply | Qty: 90 | Fill #1

## 2019-10-21 MED FILL — ROSUVASTATIN CALCIUM 5 MG T: 5 | 90 days supply | Qty: 39 | Fill #4

## 2019-10-23 MED FILL — ZOLPIDEM TARTRATE 5 MG TAB: 5 | 30 days supply | Qty: 30 | Fill #2

## 2019-11-15 ENCOUNTER — Telehealth: Payer: Self-pay | Admitting: Family Medicine

## 2019-11-15 DIAGNOSIS — K644 Residual hemorrhoidal skin tags: Secondary | ICD-10-CM

## 2019-11-15 MED ORDER — HYDROCORT-PRAMOXINE (PERIANAL) 2.5-1 % EX CREA: 1.0000 "application " | TOPICAL_CREAM | Freq: Three times a day (TID) | CUTANEOUS | 0 refills | Status: DC

## 2019-11-15 NOTE — Telephone Encounter (Signed)
Recent onset of painful external hemorrhoid. Has used analpram in past. Will send in rx for tid prn.

## 2019-11-16 DIAGNOSIS — Z20822 Contact with and (suspected) exposure to covid-19: Secondary | ICD-10-CM | POA: Diagnosis not present

## 2019-11-18 MED FILL — LOSARTAN POTASSIUM 100 MG T: 100 | 90 days supply | Qty: 90 | Fill #2

## 2019-11-18 MED FILL — HYDROCORTISON-ACETIC ACID S: 1-2 | 11 days supply | Qty: 10 | Fill #0

## 2019-11-22 ENCOUNTER — Other Ambulatory Visit: Payer: Self-pay | Admitting: Family Medicine

## 2019-11-22 DIAGNOSIS — G4700 Insomnia, unspecified: Secondary | ICD-10-CM

## 2019-11-22 MED FILL — ZOLPIDEM TARTRATE 5 MG TAB: 5 | 30 days supply | Qty: 30 | Fill #0

## 2019-11-22 NOTE — Telephone Encounter (Signed)
Requesting:Ambien Contract:  none UDS: none Last Visit:10/09/2019 Next Visit:03/11/2020 Last Refill:08/26/2019  Please Advise

## 2019-11-26 MED FILL — HYDROCORTISON-ACETIC ACID S: 1-2 | 11 days supply | Qty: 10 | Fill #1

## 2019-12-02 MED FILL — DOXYCYCLINE HYCLATE 100 MG: 100 | 60 days supply | Qty: 60 | Fill #1

## 2019-12-03 ENCOUNTER — Other Ambulatory Visit: Payer: Self-pay | Admitting: Family Medicine

## 2019-12-03 DIAGNOSIS — Z298 Encounter for other specified prophylactic measures: Secondary | ICD-10-CM

## 2019-12-07 MED FILL — BASAGLAR 100 UNIT/ML KWIKPE: 100 | 81 days supply | Qty: 18 | Fill #1

## 2019-12-11 DIAGNOSIS — G4733 Obstructive sleep apnea (adult) (pediatric): Secondary | ICD-10-CM | POA: Diagnosis not present

## 2019-12-12 MED FILL — BYSTOLIC 10 MG TABLET: 10 | 90 days supply | Qty: 90 | Fill #2

## 2019-12-26 MED FILL — metFORMIN HCL ER 500 MG TB2: 500 | 90 days supply | Qty: 360 | Fill #1

## 2019-12-26 MED FILL — CLOPIDOGREL 75 MG TABLET: 75 | 90 days supply | Qty: 90 | Fill #1

## 2020-01-09 DIAGNOSIS — G4733 Obstructive sleep apnea (adult) (pediatric): Secondary | ICD-10-CM | POA: Diagnosis not present

## 2020-01-10 ENCOUNTER — Encounter: Payer: Self-pay | Admitting: Family Medicine

## 2020-01-13 ENCOUNTER — Other Ambulatory Visit: Payer: Self-pay | Admitting: Family Medicine

## 2020-01-13 DIAGNOSIS — E119 Type 2 diabetes mellitus without complications: Secondary | ICD-10-CM

## 2020-01-13 DIAGNOSIS — M1000 Idiopathic gout, unspecified site: Secondary | ICD-10-CM

## 2020-01-13 MED FILL — INVOKANA 300 MG TABLET: 300 | 90 days supply | Qty: 90 | Fill #0

## 2020-01-13 MED FILL — ALLOPURINOL 300 MG TABS: 300 | 90 days supply | Qty: 90 | Fill #0

## 2020-01-14 MED FILL — TAMSULOSIN HCL 0.4 MG CAP: 0.4 | 90 days supply | Qty: 90 | Fill #2

## 2020-01-20 ENCOUNTER — Other Ambulatory Visit: Payer: Self-pay | Admitting: Family Medicine

## 2020-01-20 MED FILL — ROSUVASTATIN CALCIUM 5 MG T: 5 | 84 days supply | Qty: 36 | Fill #0

## 2020-01-21 ENCOUNTER — Other Ambulatory Visit: Payer: Self-pay

## 2020-01-21 ENCOUNTER — Ambulatory Visit (INDEPENDENT_AMBULATORY_CARE_PROVIDER_SITE_OTHER): Payer: 59 | Admitting: *Deleted

## 2020-01-21 DIAGNOSIS — Z23 Encounter for immunization: Secondary | ICD-10-CM

## 2020-01-21 NOTE — Progress Notes (Signed)
Patient here for high dose flu vaccine.  Vaccine given in left deltoid and patient tolerated well.  

## 2020-02-17 DIAGNOSIS — Z03818 Encounter for observation for suspected exposure to other biological agents ruled out: Secondary | ICD-10-CM | POA: Diagnosis not present

## 2020-02-17 MED FILL — LOSARTAN POTASSIUM 100 MG T: 100 | 90 days supply | Qty: 90 | Fill #3

## 2020-02-26 MED FILL — BASAGLAR 100 UNIT/ML KWIKPE: 100 | 81 days supply | Qty: 18 | Fill #2

## 2020-03-03 DIAGNOSIS — E119 Type 2 diabetes mellitus without complications: Secondary | ICD-10-CM | POA: Diagnosis not present

## 2020-03-11 ENCOUNTER — Ambulatory Visit: Payer: 59 | Admitting: Family Medicine

## 2020-03-11 MED FILL — NEBIVOLOL HCL 10 MG TABS: 10 | 90 days supply | Qty: 90 | Fill #3

## 2020-03-19 NOTE — Progress Notes (Deleted)
[place] at [facility] [address] [place], [place] [address] [phone]  Date:  03/25/2020   Name:  Phillip [name]   DOB:  1950/12/02   MRN:  [id number]  PCP:  Darreld Mclean, MD    Chief Complaint: No chief complaint on file.   History of Present Illness:  Phillip [name] is a 69 y.o. very pleasant male patient who presents with the following:  [name] is here today for follow-up of diabetes- history of diabetes with neuropathy, CAD, CVA 2014 with full recovery, sleep apnea, dyslipidemia and hypertension Last seen by myself in June  A1c has gone up a bit at last check, patient had not been following his diet as well so this was not surprising  Lab Results  Component Value Date   HGBA1C 8.2 (H) 10/09/2019   COVID-19 series Eye exam Flu vaccine Check A1c today ?  Check PSA today   Patient Active Problem List   Diagnosis Date Noted  . Type 2 diabetes mellitus with diabetic neuropathy, without long-term current use of insulin ([place]) 09/02/2019  . OSA and COPD overlap syndrome ([place]) 09/02/2019  . Abnormal hemoglobin ([place]) 09/02/2019  . OSA on CPAP 08/12/2015  . Ventricular tachycardia ([place]) 11/10/2014  . CAD (coronary artery disease), native coronary artery   . Variants of migraine, not elsewhere classified, without mention of intractable migraine without mention of status migrainosus 08/14/2013  . History of ischemic stroke without residual deficits 04/17/2013  . ED (erectile dysfunction) 02/01/2012  . Gout 09/05/2011  . Hyperlipidemia 09/05/2011  . Hypertriglyceridemia 09/05/2011  . PVC (premature ventricular contraction) 09/05/2011  . Type 2 diabetes, controlled, with neuropathy ([place]) 06/01/2011  . Essential hypertension, benign 06/01/2011    Past Medical History:  Diagnosis Date  . Anemia   . Arthritis   . Diabetes mellitus   . Gout   . Hyperlipidemia   . Hypertension   . Localized osteoarthritis  of left knee 11/20/2012  . Migraine headache with aura   . Neuromuscular disorder ([place])    " mild periferal neauropathy  . Osteoarthritis of left hip 11/20/2012  . PVC (premature ventricular contraction)   . Sleep apnea    cpap    Past Surgical History:  Procedure Laterality Date  . CHOLECYSTECTOMY  1997  . CVA     CVA, occiptal  . CVA     CVA, occipital  . knee arthoscopy  2006   Left Knee  . LEFT HEART CATH AND CORONARY ANGIOGRAPHY N/A 12/07/2017   Procedure: LEFT HEART CATH AND CORONARY ANGIOGRAPHY;  Surgeon: Troy Sine, MD;  Location: [place] CV [facility];  Service: Cardiovascular;  Laterality: N/A;  . left total hip    . left unicarpmental knee    . LOOP RECORDER IMPLANT N/A 04/19/2013   Procedure: LOOP RECORDER IMPLANT;  Surgeon: Evans Lance, MD;  Location: [facility] CATH LAB;  Service: Cardiovascular;  Laterality: N/A;  . PARTIAL KNEE ARTHROPLASTY Left 11/20/2012   Procedure: UNICOMPARTMENTAL KNEE;  Surgeon: Johnny Bridge, MD;  Location: [facility];  Service: Orthopedics;  Laterality: Left;  . REPLACEMENT UNICONDYLAR JOINT KNEE Left 11/20/2012   Dr Mardelle Matte  . TEE WITHOUT CARDIOVERSION N/A 04/19/2013   Procedure: TRANSESOPHAGEAL ECHOCARDIOGRAM (TEE);  Surgeon: Larey Dresser, MD;  Location: [place];  Service: Cardiovascular;  Laterality: N/A;  . TONSILLECTOMY AND ADENOIDECTOMY     Childhood  . TOTAL HIP ARTHROPLASTY Left 11/20/2012   Dr  Landau  . TOTAL HIP ARTHROPLASTY Left 11/20/2012   Procedure: TOTAL HIP ARTHROPLASTY;  Surgeon: Johnny Bridge, MD;  Location: [facility];  Service: Orthopedics;  Laterality: Left;    Social History   Tobacco Use  . Smoking status: Never Smoker  . Smokeless tobacco: Never Used  Substance Use Topics  . Alcohol use: No    Alcohol/week: 0.0 standard drinks  . Drug use: No    Family History  Problem Relation Age of Onset  . Cancer Mother        Breast  . Stroke Mother   . Stroke Maternal Grandmother   . Stroke Maternal Aunt   . Stroke  Maternal Uncle     Allergies  Allergen Reactions  . Sulfa Antibiotics Other (See Comments)    Unknown reaction    Medication list has been reviewed and updated.  Current Outpatient Medications on File Prior to Visit  Medication Sig Dispense Refill  . doxycycline (VIBRAMYCIN) 100 MG capsule TAKE 1 CAPSULE BY MOUTH DAILY. USE FOR MALARIA PROPHYLAXIS 60 capsule 0  . acetaminophen (TYLENOL) 650 MG CR tablet Take 1,300 mg by mouth every 8 (eight) hours as needed for pain.    . Alcohol Swabs PADS Use to test up to 4 times daily 100 each 2  . allopurinol (ZYLOPRIM) 300 MG tablet TAKE 1 TABLET BY MOUTH ONCE DAILY 90 tablet 1  . blood glucose meter kit and supplies KIT Use up to four times daily as directed. Dx E13.40. 1 each 0  . Blood Glucose Monitoring Suppl (FREESTYLE LITE) DEVI Use to test up to 4 times daily 1 each 0  . clopidogrel (PLAVIX) 75 MG tablet TAKE 1 TABLET BY MOUTH DAILY WITH BREAKFAST. 90 tablet 1  . diclofenac sodium (VOLTAREN) 1 % GEL Apply 1 application topically 2 (two) times daily as needed (for pain).    . Exenatide ER (BYDUREON BCISE) 2 MG/0.85ML AUIJ Inject 2 mg into the skin once a week. 12 pen 3  . glucose blood (FREESTYLE LITE) test strip USE TO TEST UP TO 4 TIMES DAILY 100 each 12  . hydrocortisone-pramoxine (ANALPRAM HC) 2.5-1 % rectal cream Place 1 application rectally 3 (three) times daily. 30 g 0  . Insulin Glargine (BASAGLAR KWIKPEN) 100 UNIT/ML Inject 0.22 mLs (22 Units total) into the skin daily. 15 mL 9  . Insulin Pen Needle (PEN NEEDLES) 32G X 5 MM MISC 1 each by Does not apply route daily. Use for basal insulin injection 100 each 6  . INVOKANA 300 MG TABS tablet TAKE 1 TABLET BY MOUTH ONCE DAILY 90 tablet 1  . losartan (COZAAR) 100 MG tablet TAKE 1 TABLET BY MOUTH DAILY. 90 tablet 3  . metaxalone (SKELAXIN) 800 MG tablet Take 1 tablet (800 mg total) by mouth every 6 (six) hours as needed. (Patient taking differently: Take 800 mg by mouth every 6 (six) hours  as needed for muscle spasms. ) 60 tablet 1  . metFORMIN (GLUCOPHAGE-XR) 500 MG 24 hr tablet TAKE 2 TABLETS BY MOUTH 2 TIMES DAILY. 360 tablet 1  . nebivolol (BYSTOLIC) 10 MG tablet Take 1 tablet (10 mg total) by mouth every evening. 90 tablet 3  . rosuvastatin (CRESTOR) 5 MG tablet TAKE 1 TABLET (5 MG TOTAL) BY MOUTH EVERY MONDAY, WEDNESDAY, AND FRIDAY. 39 tablet 4  . SM LANCETS 33G MISC USE TO TEST BLOOD SUGAR TWICE DAILY 400 each PRN  . tamsulosin (FLOMAX) 0.4 MG CAPS capsule TAKE 1 CAPSULE BY MOUTH ONCE DAILY 90  capsule 3  . zolpidem (AMBIEN) 5 MG tablet TAKE 1 TABLET BY MOUTH AT BEDTIME AS NEEDED FOR SLEEP 30 tablet 2   No current facility-administered medications on file prior to visit.    Review of Systems:  As per HPI- otherwise negative.   Physical Examination: There were no vitals filed for this visit. There were no vitals filed for this visit. There is no height or weight on file to calculate BMI. Ideal Body Weight:    GEN: no acute distress. HEENT: Atraumatic, Normocephalic.  Ears and Nose: No external deformity. CV: RRR, No M/G/R. No JVD. No thrill. No extra heart sounds. PULM: CTA B, no wheezes, crackles, rhonchi. No retractions. No resp. distress. No accessory muscle use. ABD: S, NT, ND, +BS. No rebound. No HSM. EXTR: No c/c/e PSYCH: Normally interactive. Conversant.    Assessment and Plan: *** This visit occurred during the SARS-CoV-2 public health emergency.  Safety protocols were in place, including screening questions prior to the visit, additional usage of staff PPE, and extensive cleaning of exam room while observing appropriate contact time as indicated for disinfecting solutions.    Signed Lamar Blinks, MD

## 2020-03-25 ENCOUNTER — Other Ambulatory Visit: Payer: Self-pay

## 2020-03-25 ENCOUNTER — Ambulatory Visit: Payer: 59 | Admitting: Family Medicine

## 2020-03-25 ENCOUNTER — Other Ambulatory Visit: Payer: Self-pay | Admitting: Family Medicine

## 2020-03-25 DIAGNOSIS — Z8673 Personal history of transient ischemic attack (TIA), and cerebral infarction without residual deficits: Secondary | ICD-10-CM

## 2020-03-25 DIAGNOSIS — I10 Essential (primary) hypertension: Secondary | ICD-10-CM

## 2020-03-25 DIAGNOSIS — E119 Type 2 diabetes mellitus without complications: Secondary | ICD-10-CM

## 2020-03-25 DIAGNOSIS — Z0289 Encounter for other administrative examinations: Secondary | ICD-10-CM

## 2020-03-25 MED FILL — metFORMIN HCL ER 500 MG TB2: 500 | 90 days supply | Qty: 360 | Fill #0

## 2020-03-25 MED FILL — CLOPIDOGREL 75 MG TABLET: 75 | 90 days supply | Qty: 90 | Fill #0

## 2020-03-28 ENCOUNTER — Other Ambulatory Visit: Payer: Self-pay

## 2020-03-28 DIAGNOSIS — Z8673 Personal history of transient ischemic attack (TIA), and cerebral infarction without residual deficits: Secondary | ICD-10-CM

## 2020-03-28 DIAGNOSIS — E119 Type 2 diabetes mellitus without complications: Secondary | ICD-10-CM

## 2020-03-28 MED ORDER — CLOPIDOGREL BISULFATE 75 MG PO TABS: 75.0000 mg | ORAL_TABLET | Freq: Every day | ORAL | 1 refills | Status: DC

## 2020-03-28 MED ORDER — METFORMIN HCL ER 500 MG PO TB24: 1000.0000 mg | ORAL_TABLET | Freq: Two times a day (BID) | ORAL | 1 refills | Status: DC

## 2020-04-01 ENCOUNTER — Other Ambulatory Visit: Payer: Self-pay | Admitting: Family Medicine

## 2020-04-01 ENCOUNTER — Telehealth: Payer: Self-pay | Admitting: Family Medicine

## 2020-04-01 DIAGNOSIS — Z8673 Personal history of transient ischemic attack (TIA), and cerebral infarction without residual deficits: Secondary | ICD-10-CM

## 2020-04-01 DIAGNOSIS — M1000 Idiopathic gout, unspecified site: Secondary | ICD-10-CM

## 2020-04-01 DIAGNOSIS — E119 Type 2 diabetes mellitus without complications: Secondary | ICD-10-CM

## 2020-04-01 DIAGNOSIS — M25552 Pain in left hip: Secondary | ICD-10-CM | POA: Diagnosis not present

## 2020-04-01 DIAGNOSIS — M25562 Pain in left knee: Secondary | ICD-10-CM | POA: Diagnosis not present

## 2020-04-01 DIAGNOSIS — M545 Low back pain, unspecified: Secondary | ICD-10-CM | POA: Diagnosis not present

## 2020-04-01 MED ORDER — CLOPIDOGREL BISULFATE 75 MG PO TABS: 75.0000 mg | ORAL_TABLET | Freq: Every day | ORAL | 3 refills | Status: DC

## 2020-04-01 MED ORDER — ALLOPURINOL 300 MG PO TABS: 300.0000 mg | ORAL_TABLET | Freq: Every day | ORAL | 3 refills | Status: DC

## 2020-04-01 MED ORDER — METFORMIN HCL ER 500 MG PO TB24
1000.0000 mg | ORAL_TABLET | Freq: Two times a day (BID) | ORAL | 3 refills | Status: DC
  Filled 2020-08-06 – 2020-09-29 (×2): qty 360, 90d supply, fill #0

## 2020-04-01 MED ORDER — NEBIVOLOL HCL 10 MG PO TABS
10.0000 mg | ORAL_TABLET | Freq: Every evening | ORAL | 3 refills | Status: DC
Start: 2020-04-01 — End: 2020-04-01

## 2020-04-01 MED FILL — ALLOPURINOL 300 MG TABS: 300 | 90 days supply | Qty: 90 | Fill #0

## 2020-04-01 NOTE — Telephone Encounter (Signed)
Pt requested CPE appt, has appt end of Dec.  Pt sent message in after appt was made    "Thank you.  I plan to make it.  Unfortunately, I think 5 meds run out before my visit: Nebivolol Allopurinol Clopidogrel Metformin When you get a chance please refill to [facility]  Thank you. Phillip [name]"     Please advise

## 2020-04-01 NOTE — Telephone Encounter (Signed)
Medications sent to pharmacy for patient.

## 2020-04-06 MED FILL — INVOKANA 300 MG TABLET: 300 | 90 days supply | Qty: 90 | Fill #1

## 2020-04-07 MED FILL — ROSUVASTATIN CALCIUM 5 MG T: 5 | 84 days supply | Qty: 36 | Fill #1

## 2020-04-13 MED FILL — TAMSULOSIN HCL 0.4 MG CAP: 0.4 | 90 days supply | Qty: 90 | Fill #3

## 2020-04-19 NOTE — Progress Notes (Signed)
[place] at [facility] [place], [place],  [address] [phone]  Date:  04/27/2020   Name:  Phillip [name]   DOB:  05/29/1950   MRN:  [id number]  PCP:  Darreld Mclean, MD    Chief Complaint: Annual Exam   History of Present Illness:  Phillip [name] is a 69 y.o. 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  His schedule has made regular exercise a bit more difficult His fasting glucose is running about 120- 150; this is a bit higher than he would like Post prandials are going higher than he is used to as well Patient Active Problem List   Diagnosis Date Noted  . Type 2 diabetes mellitus with diabetic neuropathy, without long-term current use of insulin ([place]) 09/02/2019  . OSA and COPD overlap syndrome ([place]) 09/02/2019  . Abnormal hemoglobin ([place]) 09/02/2019  . OSA on CPAP 08/12/2015  . Ventricular tachycardia ([place])  11/10/2014  . CAD (coronary artery disease), native coronary artery   . Variants of migraine, not elsewhere classified, without mention of intractable migraine without mention of status migrainosus 08/14/2013  . History of ischemic stroke without residual deficits 04/17/2013  . ED (erectile dysfunction) 02/01/2012  . Gout 09/05/2011  . Hyperlipidemia 09/05/2011  . Hypertriglyceridemia 09/05/2011  . PVC (premature ventricular contraction) 09/05/2011  . Type 2 diabetes, controlled, with neuropathy ([place]) 06/01/2011  . Essential hypertension, benign 06/01/2011    Past Medical History:  Diagnosis Date  . Anemia   . Arthritis   . Diabetes mellitus   . Gout   . Hyperlipidemia   . Hypertension   . Localized osteoarthritis of left knee 11/20/2012  . Migraine headache with aura   . Neuromuscular disorder ([place])    " mild periferal neauropathy  . Osteoarthritis of left hip 11/20/2012  . PVC (premature ventricular contraction)   . Sleep apnea    cpap    Past Surgical History:  Procedure Laterality Date  . CHOLECYSTECTOMY  1997  . CVA     CVA, occiptal  . CVA     CVA, occipital  . knee arthoscopy  2006   Left Knee  . LEFT HEART CATH AND CORONARY ANGIOGRAPHY N/A 12/07/2017   Procedure: LEFT HEART CATH AND CORONARY ANGIOGRAPHY;  Surgeon: Troy Sine, MD;  Location: [place] [place] [facility];  Service: Cardiovascular;  Laterality: N/A;  .  left total hip    . left unicarpmental knee    . LOOP RECORDER IMPLANT N/A 04/19/2013   Procedure: LOOP RECORDER IMPLANT;  Surgeon: Evans Lance, MD;  Location: [facility] CATH LAB;  Service: Cardiovascular;  Laterality: N/A;  . PARTIAL KNEE ARTHROPLASTY Left 11/20/2012   Procedure: UNICOMPARTMENTAL KNEE;  Surgeon: Johnny Bridge, MD;  Location: [place];  Service: Orthopedics;  Laterality: Left;  . REPLACEMENT UNICONDYLAR JOINT KNEE Left 11/20/2012   Dr Mardelle Matte  . TEE WITHOUT CARDIOVERSION N/A 04/19/2013   Procedure: TRANSESOPHAGEAL ECHOCARDIOGRAM (TEE);  Surgeon:  Larey Dresser, MD;  Location: [facility];  Service: Cardiovascular;  Laterality: N/A;  . TONSILLECTOMY AND ADENOIDECTOMY     Childhood  . TOTAL HIP ARTHROPLASTY Left 11/20/2012   Dr Mardelle Matte  . TOTAL HIP ARTHROPLASTY Left 11/20/2012   Procedure: TOTAL HIP ARTHROPLASTY;  Surgeon: Johnny Bridge, MD;  Location: [place] [facility];  Service: Orthopedics;  Laterality: Left;    Social History   Tobacco Use  . Smoking status: Never Smoker  . Smokeless tobacco: Never Used  Substance Use Topics  . Alcohol use: No    Alcohol/week: 0.0 standard drinks  . Drug use: No    Family History  Problem Relation Age of Onset  . Cancer Mother        Breast  . Stroke Mother   . Stroke Maternal Grandmother   . Stroke Maternal Aunt   . Stroke Maternal Uncle     Allergies  Allergen Reactions  . Sulfa Antibiotics Other (See Comments)    Unknown reaction    Medication list has been reviewed and updated.  Current Outpatient Medications on File Prior to Visit  Medication Sig Dispense Refill  . Alcohol Swabs PADS Use to test up to 4 times daily 100 each 2  . allopurinol (ZYLOPRIM) 300 MG tablet Take 1 tablet (300 mg total) by mouth daily. 90 tablet 3  . blood glucose meter kit and supplies KIT Use up to four times daily as directed. Dx E13.40. 1 each 0  . Blood Glucose Monitoring Suppl (FREESTYLE LITE) DEVI Use to test up to 4 times daily 1 each 0  . BYDUREON BCISE 2 MG/0.85ML AUIJ INJECT 2 MG INTO THE SKIN ONCE A WEEK. 10.2 mL 3  . clopidogrel (PLAVIX) 75 MG tablet Take 1 tablet (75 mg total) by mouth daily with breakfast. 90 tablet 3  . diclofenac sodium (VOLTAREN) 1 % GEL Apply 1 application topically 2 (two) times daily as needed (for pain).    [name] doxycycline (VIBRAMYCIN) 100 MG capsule TAKE 1 CAPSULE BY MOUTH DAILY. USE FOR MALARIA PROPHYLAXIS (Patient not taking: Reported on 04/27/2020) 60 capsule 0  . glucose blood (FREESTYLE LITE) test strip USE TO TEST UP TO 4 TIMES DAILY 100 each 12  . Insulin  Glargine (BASAGLAR KWIKPEN) 100 UNIT/ML Inject 0.22 mLs (22 Units total) into the skin daily. 15 mL 9  . Insulin Pen Needle (PEN NEEDLES) 32G X 5 MM MISC 1 each by Does not apply route daily. Use for basal insulin injection 100 each 6  . INVOKANA 300 MG TABS tablet TAKE 1 TABLET BY MOUTH ONCE DAILY 90 tablet 1  . losartan (COZAAR) 100 MG tablet TAKE 1 TABLET BY MOUTH DAILY. 90 tablet 3  . metFORMIN (GLUCOPHAGE-XR) 500 MG 24 hr tablet Take 2 tablets (1,000 mg total) by mouth 2 (two) times daily. 360 tablet 3  . nebivolol (BYSTOLIC) 10 MG tablet Take 1 tablet (10 mg total) by mouth every  evening. 90 tablet 3  . rosuvastatin (CRESTOR) 5 MG tablet TAKE 1 TABLET (5 MG TOTAL) BY MOUTH EVERY MONDAY, WEDNESDAY, AND FRIDAY. 39 tablet 4  . SM LANCETS 33G MISC USE TO TEST BLOOD SUGAR TWICE DAILY 400 each PRN  . tamsulosin (FLOMAX) 0.4 MG CAPS capsule TAKE 1 CAPSULE BY MOUTH ONCE DAILY 90 capsule 3  . zolpidem (AMBIEN) 5 MG tablet TAKE 1 TABLET BY MOUTH AT BEDTIME AS NEEDED FOR SLEEP 30 tablet 2  . acetaminophen (TYLENOL) 650 MG CR tablet Take 1,300 mg by mouth every 8 (eight) hours as needed for pain. (Patient not taking: Reported on 04/27/2020)    . hydrocortisone-pramoxine (ANALPRAM HC) 2.5-1 % rectal cream Place 1 application rectally 3 (three) times daily. (Patient not taking: Reported on 04/27/2020) 30 g 0  . metaxalone (SKELAXIN) 800 MG tablet Take 1 tablet (800 mg total) by mouth every 6 (six) hours as needed. (Patient not taking: Reported on 04/27/2020) 60 tablet 1   No current facility-administered medications on file prior to visit.    Review of Systems:  As per HPI- otherwise negative.  No SOB or CP with exercise  Physical Examination: Vitals:   04/27/20 0900  BP: 136/70  Pulse: 60  Resp: 18  Temp: 98 F (36.7 C)  SpO2: 95%   Vitals:   04/27/20 0900  Weight: 232 lb 6 oz (105.4 kg)  Height: 6' (1.829 m)   Body mass index is 31.52 kg/m. Ideal Body Weight: Weight in (lb) to have  BMI = 25: 183.9  GEN: no acute distress. Overweight, looks well  HEENT: Atraumatic, Normocephalic.  Ears and Nose: No external deformity. CV: RRR, No M/G/R. No JVD. No thrill. No extra heart sounds. PULM: CTA B, no wheezes, crackles, rhonchi. No retractions. No resp. distress. No accessory muscle use. ABD: S, NT, ND, +BS. No rebound. No HSM. EXTR: No c/c/e PSYCH: Normally interactive. Conversant.    Assessment and Plan: Physical exam  Idiopathic gout, unspecified chronicity, unspecified site - Plan: Uric acid  Controlled type 2 diabetes mellitus without complication, without long-term current use of insulin (HCC) - Plan: Comprehensive metabolic panel, Hemoglobin A1c  Essential hypertension - Plan: CBC, Comprehensive metabolic panel, losartan (COZAAR) 100 MG tablet  OSA on CPAP  Mixed hyperlipidemia - Plan: Lipid panel  Benign prostatic hyperplasia with urinary frequency - Plan: PSA  Need for malaria prophylaxis - Plan: doxycycline (VIBRAMYCIN) 100 MG capsule  External hemorrhoids - Plan: hydrocortisone-pramoxine (ANALPRAM HC) 2.5-1 % rectal cream  Insomnia, unspecified type - Plan: zolpidem (AMBIEN) 5 MG tablet  Travel advice encounter - Plan: ciprofloxacin (CIPRO) 500 MG tablet  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  Labs are pending as above, main concern is A1c.  I will be in touch this result as soon as possible Refilled Ambien which he uses for insomnia Prescription for Cipro provided for upcoming international travel, also doxycycline for malaria prophylaxis Blood pressures under good control on current regimen This visit occurred during the SARS-CoV-2 public health emergency.  Safety protocols were in place, including screening questions prior to the visit, additional usage of staff PPE, and extensive cleaning of exam room while observing appropriate contact time as indicated for disinfecting solutions.    Signed Lamar Blinks, MD  Received  labs as below, message to patient Results for orders placed or performed in visit on 04/27/20  CBC  Result Value Ref Range   WBC 4.3 4.0 - 10.5 K/uL   RBC 4.52 4.22 -  5.81 Mil/uL   Platelets 249.0 150.0 - 400.0 K/uL   Hemoglobin 14.0 13.0 - 17.0 g/dL   HCT 41.9 39.0 - 52.0 %   MCV 92.7 78.0 - 100.0 fl   MCHC 33.4 30.0 - 36.0 g/dL   RDW 13.0 11.5 - 15.5 %  Comprehensive metabolic panel  Result Value Ref Range   Sodium 136 135 - 145 mEq/L   Potassium 4.1 3.5 - 5.1 mEq/L   Chloride 104 96 - 112 mEq/L   CO2 24 19 - 32 mEq/L   Glucose, Bld 161 (H) 70 - 99 mg/dL   BUN 27 (H) 6 - 23 mg/dL   Creatinine, Ser 1.04 0.40 - 1.50 mg/dL   Total Bilirubin 0.6 0.2 - 1.2 mg/dL   Alkaline Phosphatase 58 39 - 117 U/L   AST 15 0 - 37 U/L   ALT 20 0 - 53 U/L   Total Protein 6.6 6.0 - 8.3 g/dL   Albumin 4.3 3.5 - 5.2 g/dL   GFR 72.35 >60.00 mL/min   Calcium 9.6 8.4 - 10.5 mg/dL  Hemoglobin A1c  Result Value Ref Range   Hgb A1c MFr Bld 8.2 (H) 4.6 - 6.5 %  Lipid panel  Result Value Ref Range   Cholesterol 140 0 - 200 mg/dL   Triglycerides 199.0 (H) 0.0 - 149.0 mg/dL   HDL 44.40 >39.00 mg/dL   VLDL 39.8 0.0 - 40.0 mg/dL   LDL Cholesterol 56 0 - 99 mg/dL   Total CHOL/HDL Ratio 3    NonHDL 95.81   PSA  Result Value Ref Range   PSA 1.99 0.10 - 4.00 ng/mL  Uric acid  Result Value Ref Range   Uric Acid, Serum 4.2 4.0 - 7.8 mg/dL

## 2020-04-21 ENCOUNTER — Other Ambulatory Visit: Payer: Self-pay | Admitting: Family Medicine

## 2020-04-21 DIAGNOSIS — E119 Type 2 diabetes mellitus without complications: Secondary | ICD-10-CM

## 2020-04-21 MED FILL — CLOPIDOGREL 75 MG TABLET: 75 | 90 days supply | Qty: 90 | Fill #0

## 2020-04-21 MED FILL — BYDUREON BCise 2 MG/0.85ML: 2 | 84 days supply | Qty: 10 | Fill #0

## 2020-04-27 ENCOUNTER — Encounter: Payer: Self-pay | Admitting: Family Medicine

## 2020-04-27 ENCOUNTER — Ambulatory Visit (INDEPENDENT_AMBULATORY_CARE_PROVIDER_SITE_OTHER): Payer: 59 | Admitting: Family Medicine

## 2020-04-27 ENCOUNTER — Other Ambulatory Visit (HOSPITAL_COMMUNITY): Payer: Self-pay | Admitting: Family Medicine

## 2020-04-27 ENCOUNTER — Other Ambulatory Visit: Payer: Self-pay

## 2020-04-27 VITALS — BP 136/70 | HR 60 | Temp 98.0°F | Resp 18 | Ht 72.0 in | Wt 232.4 lb

## 2020-04-27 DIAGNOSIS — R350 Frequency of micturition: Secondary | ICD-10-CM

## 2020-04-27 DIAGNOSIS — M1000 Idiopathic gout, unspecified site: Secondary | ICD-10-CM | POA: Diagnosis not present

## 2020-04-27 DIAGNOSIS — E119 Type 2 diabetes mellitus without complications: Secondary | ICD-10-CM

## 2020-04-27 DIAGNOSIS — Z298 Encounter for other specified prophylactic measures: Secondary | ICD-10-CM

## 2020-04-27 DIAGNOSIS — N401 Enlarged prostate with lower urinary tract symptoms: Secondary | ICD-10-CM | POA: Diagnosis not present

## 2020-04-27 DIAGNOSIS — G4733 Obstructive sleep apnea (adult) (pediatric): Secondary | ICD-10-CM | POA: Diagnosis not present

## 2020-04-27 DIAGNOSIS — K644 Residual hemorrhoidal skin tags: Secondary | ICD-10-CM

## 2020-04-27 DIAGNOSIS — E782 Mixed hyperlipidemia: Secondary | ICD-10-CM

## 2020-04-27 DIAGNOSIS — Z9989 Dependence on other enabling machines and devices: Secondary | ICD-10-CM

## 2020-04-27 DIAGNOSIS — G4700 Insomnia, unspecified: Secondary | ICD-10-CM

## 2020-04-27 DIAGNOSIS — Z0000 Encounter for general adult medical examination without abnormal findings: Secondary | ICD-10-CM

## 2020-04-27 DIAGNOSIS — I10 Essential (primary) hypertension: Secondary | ICD-10-CM | POA: Diagnosis not present

## 2020-04-27 DIAGNOSIS — Z7184 Encounter for health counseling related to travel: Secondary | ICD-10-CM

## 2020-04-27 LAB — CBC
HCT: 41.9 % (ref 39.0–52.0)
Hemoglobin: 14 g/dL (ref 13.0–17.0)
MCHC: 33.4 g/dL (ref 30.0–36.0)
MCV: 92.7 fl (ref 78.0–100.0)
Platelets: 249 10*3/uL (ref 150.0–400.0)
RBC: 4.52 Mil/uL (ref 4.22–5.81)
RDW: 13 % (ref 11.5–15.5)
WBC: 4.3 10*3/uL (ref 4.0–10.5)

## 2020-04-27 LAB — COMPREHENSIVE METABOLIC PANEL
ALT: 20 U/L (ref 0–53)
AST: 15 U/L (ref 0–37)
Albumin: 4.3 g/dL (ref 3.5–5.2)
Alkaline Phosphatase: 58 U/L (ref 39–117)
BUN: 27 mg/dL — ABNORMAL HIGH (ref 6–23)
CO2: 24 mEq/L (ref 19–32)
Calcium: 9.6 mg/dL (ref 8.4–10.5)
Chloride: 104 mEq/L (ref 96–112)
Creatinine, Ser: 1.04 mg/dL (ref 0.40–1.50)
GFR: 72.35 mL/min (ref >60.00)
Glucose, Bld: 161 mg/dL — ABNORMAL HIGH (ref 70–99)
Potassium: 4.1 mEq/L (ref 3.5–5.1)
Sodium: 136 mEq/L (ref 135–145)
Total Bilirubin: 0.6 mg/dL (ref 0.2–1.2)
Total Protein: 6.6 g/dL (ref 6.0–8.3)

## 2020-04-27 LAB — LIPID PANEL
Cholesterol: 140 mg/dL (ref 0–200)
HDL: 44.4 mg/dL (ref >39.00)
LDL Cholesterol: 56 mg/dL (ref 0–99)
NonHDL: 95.81
Total CHOL/HDL Ratio: 3
Triglycerides: 199 mg/dL — ABNORMAL HIGH (ref 0.0–149.0)
VLDL: 39.8 mg/dL (ref 0.0–40.0)

## 2020-04-27 LAB — PSA: PSA: 1.99 ng/mL (ref 0.10–4.00)

## 2020-04-27 LAB — HEMOGLOBIN A1C: Hgb A1c MFr Bld: 8.2 % — ABNORMAL HIGH (ref 4.6–6.5)

## 2020-04-27 LAB — URIC ACID: Uric Acid, Serum: 4.2 mg/dL (ref 4.0–7.8)

## 2020-04-27 MED ORDER — DOXYCYCLINE HYCLATE 100 MG PO CAPS
ORAL_CAPSULE | ORAL | 0 refills | Status: DC
Start: 2020-04-27 — End: 2020-06-24

## 2020-04-27 MED ORDER — CIPROFLOXACIN HCL 500 MG PO TABS: 500.0000 mg | ORAL_TABLET | Freq: Two times a day (BID) | ORAL | 0 refills | Status: DC

## 2020-04-27 MED ORDER — HYDROCORT-PRAMOXINE (PERIANAL) 2.5-1 % EX CREA: 1.0000 "application " | TOPICAL_CREAM | Freq: Three times a day (TID) | CUTANEOUS | 0 refills | Status: DC

## 2020-04-27 MED ORDER — LOSARTAN POTASSIUM 100 MG PO TABS: 100.0000 mg | ORAL_TABLET | Freq: Every day | ORAL | 3 refills | Status: DC

## 2020-04-27 MED ORDER — ZOLPIDEM TARTRATE 5 MG PO TABS: 5.0000 mg | ORAL_TABLET | Freq: Every evening | ORAL | 2 refills | Status: DC | PRN

## 2020-04-27 MED FILL — ZOLPIDEM TARTRATE 5 MG TABS: 5 | 30 days supply | Qty: 30 | Fill #0

## 2020-04-27 MED FILL — CIPROFLOXACIN HCL 500 MG TA: 500 | 10 days supply | Qty: 20 | Fill #0

## 2020-04-27 MED FILL — HYDROCORT-PRAMOXINE 2.5-1%: 2.5-1 | 10 days supply | Qty: 30 | Fill #0

## 2020-04-27 MED FILL — DOXYCYCLINE HYCLATE 100 MG: 100 | 60 days supply | Qty: 60 | Fill #0

## 2020-04-27 NOTE — Patient Instructions (Signed)
Good to see you again today!  I will be in touch with your labs asap Give my love to [place] and [place]

## 2020-05-15 DIAGNOSIS — Z20822 Contact with and (suspected) exposure to covid-19: Secondary | ICD-10-CM | POA: Diagnosis not present

## 2020-05-16 MED FILL — BASAGLAR 100 UNIT/ML KWIKPE: 100 | 81 days supply | Qty: 18 | Fill #3

## 2020-05-18 MED FILL — LOSARTAN POTASSIUM 100 MG T: 100 | 30 days supply | Qty: 30 | Fill #0

## 2020-05-25 MED FILL — ZOLPIDEM TARTRATE 5 MG TABS: 5 | 30 days supply | Qty: 30 | Fill #1

## 2020-05-30 MED FILL — metFORMIN HCL ER 500 MG TB2: 500 | 90 days supply | Qty: 360 | Fill #0

## 2020-05-30 MED FILL — NEBIVOLOL HCL 10 MG TABS: 10 | 90 days supply | Qty: 90 | Fill #0

## 2020-06-09 ENCOUNTER — Telehealth: Payer: Self-pay | Admitting: Neurology

## 2020-06-09 NOTE — Telephone Encounter (Signed)
Pt. is asking if we are able to tell when he was last prescribed CPAP machine. He is also asking when he will be eligible for a new one. Please advise.

## 2020-06-10 ENCOUNTER — Encounter: Payer: Self-pay | Admitting: Neurology

## 2020-06-11 MED FILL — LOSARTAN POTASSIUM 50 MG TA: 50 | 30 days supply | Qty: 60 | Fill #0

## 2020-06-20 MED FILL — DOXYCYCLINE HYCLATE 100 MG: 100 | 60 days supply | Qty: 60 | Fill #0

## 2020-06-22 ENCOUNTER — Other Ambulatory Visit: Payer: Self-pay | Admitting: Family Medicine

## 2020-06-22 DIAGNOSIS — Z298 Encounter for other specified prophylactic measures: Secondary | ICD-10-CM

## 2020-06-24 ENCOUNTER — Other Ambulatory Visit: Payer: Self-pay

## 2020-06-24 ENCOUNTER — Other Ambulatory Visit: Payer: Self-pay | Admitting: Family Medicine

## 2020-06-24 DIAGNOSIS — Z298 Encounter for other specified prophylactic measures: Secondary | ICD-10-CM

## 2020-06-24 MED ORDER — DOXYCYCLINE HYCLATE 100 MG PO CAPS: ORAL_CAPSULE | ORAL | 0 refills | Status: DC

## 2020-06-24 MED FILL — ALLOPURINOL 300 MG TABS: 300 | 90 days supply | Qty: 90 | Fill #1

## 2020-06-24 MED FILL — ZOLPIDEM TARTRATE 5 MG TABS: 5 | 30 days supply | Qty: 30 | Fill #2

## 2020-06-30 ENCOUNTER — Encounter: Payer: Self-pay | Admitting: Family Medicine

## 2020-06-30 ENCOUNTER — Telehealth: Payer: Self-pay | Admitting: Family Medicine

## 2020-06-30 ENCOUNTER — Encounter: Payer: Self-pay | Admitting: Neurology

## 2020-06-30 ENCOUNTER — Other Ambulatory Visit: Payer: Self-pay

## 2020-06-30 ENCOUNTER — Other Ambulatory Visit (HOSPITAL_COMMUNITY): Payer: Self-pay | Admitting: Family Medicine

## 2020-06-30 ENCOUNTER — Ambulatory Visit: Payer: 59 | Admitting: Neurology

## 2020-06-30 VITALS — BP 146/82 | HR 71 | Ht 72.0 in | Wt 233.0 lb

## 2020-06-30 DIAGNOSIS — I2510 Atherosclerotic heart disease of native coronary artery without angina pectoris: Secondary | ICD-10-CM | POA: Diagnosis not present

## 2020-06-30 DIAGNOSIS — J449 Chronic obstructive pulmonary disease, unspecified: Secondary | ICD-10-CM

## 2020-06-30 DIAGNOSIS — G4733 Obstructive sleep apnea (adult) (pediatric): Secondary | ICD-10-CM | POA: Diagnosis not present

## 2020-06-30 DIAGNOSIS — E1140 Type 2 diabetes mellitus with diabetic neuropathy, unspecified: Secondary | ICD-10-CM

## 2020-06-30 DIAGNOSIS — I493 Ventricular premature depolarization: Secondary | ICD-10-CM

## 2020-06-30 DIAGNOSIS — Z9989 Dependence on other enabling machines and devices: Secondary | ICD-10-CM

## 2020-06-30 MED ORDER — SEMGLEE 100 UNIT/ML ~~LOC~~ SOPN: 30.0000 [IU] | PEN_INJECTOR | Freq: Every day | SUBCUTANEOUS | 11 refills | Status: DC

## 2020-06-30 MED FILL — BASAGLAR 100 UNIT/ML KWIKPE: 100 | 50 days supply | Qty: 15 | Fill #4

## 2020-06-30 NOTE — Telephone Encounter (Signed)
[place] called in reference to patient medication Insulin Glargine [facility]) 100 UNIT/ML [[id number].  Patient needs a new script sent in , patient's insurance will cover this medication [place] , Patient is also taking 30 units . Patient is going out [place] the [place] this Friday.

## 2020-06-30 NOTE — Patient Instructions (Signed)

## 2020-06-30 NOTE — Progress Notes (Signed)
SLEEP MEDICINE CLINIC   Provider:  Larey Seat, M D  Referring Provider: Darreld Mclean, MD   Primary Care Physician:  Darreld Mclean, MD  Chief Complaint  Patient presents with  . Follow-up    RM 12, alone. Wants to discuss getting a new cpap. Using [facility] for supplies.    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  He has a history of type 2 diabetes with neuropathy, and he has a history of a stroke and that the year 2016.  His CPAP treatment started 5 years ago and he is due for new machine as of January 11.  Also he has been on CPAP for over 15 years prior to our sleep involvement.  So at this time before he will fully retire he would like to have a replacement machine and he is due for 1, I will order a home sleep test just to have the current level of apnea documented I will send an order for auto titration CPAP parallel which means that the machine can adjust itself between the pressure setting that I will be determining.  I would like to make sure that he has supplies until the new machine arrives.  He uses a ResMed nasal pillows.  He does have facial hair so this is actually a better seal.  He has used the machine for the last 30 days 100% and every night over 4 hours which makes him 100% compliant user with an average user time of 7 hours 23 minutes.  The set pressure at this time is 11 cmH2O was 2 cm expiratory pressure relief setting heated humidification and a residual AHI of 3.7.  No central apneas are emerging he does have some unknown apneas which usually means that there is some air leakage that he nurse the machine to recognize if central or obstructive.  The 95th percentile air leak is quite high at 44 L.  However his apnea index seems to be keeping low.      He is travelling to [place] for a mission trip , later this week. HST ASAP ? I spoke to him about [employer], the BMI requirement, the  Patient group that is most likely  to benefit. 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       09-02-2019:Dr. 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 Lorelei Pont for OSA , transfer of sleep care.   [name] has been an extremely compliant CPAP user and reached a compliance of 100% with an average use at time of 7 hours and 8 minutes, he is using an air sense 10 AutoSet with a serial #[id number].  Set pressure is 11 cmH2O was 2 cm EPR and his residual AHI 3.3 most of the residuals are obstructive apneas in origin there is no Cheyne-Stokes respiration noted.  The Epworth sleepiness score was endorsed at 4 out of 24 points, the fatigue severity was noted at 16 out of 63. There are some days with weather high air leaks but they did not seem not to influence the apnea count negatively. He has an over 64 year old machine and this should be replaced.  He will come to have either a PSG or HST in order to reestablished a baseline.  She needs a new machine.     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 Lorelei Pont for OSA , transfer of sleep care. He is now seen I  A virtual visit for CPAP compliance. He has used a by now 72 year old machine that can be repleced as soon as he agrees to it, or needs it.   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  His average user time is 7 hours 10 minutes, set pressure for the air sense 10 AutoSet is 11 cmH2O pressure was 2 cm EPR, he does have air leakage the 95th percentile is 37 L/min but his events per hour are just 2.5 AHI.  Very few central apneas are emerging the residual apnea index is mostly consistent of hypopnea and obstructive sleep  apnea.  The patient reports that he has noted some air leakage but usually he wakes up with a :-) on his CPAP display.  He sleeps well he reads in bed and is asleep very promptly once he puts the CPAP on he may have one nocturia rarely to and after each bathroom break is awake for 10 to 15 minutes.  He rises refreshed and restored but he feels physically stiff which may have other reasons and apnea.     The patient suffered an occipital stroke in 2014. He has a long history of migraines. He had a last sleep study in [place], by Dr. Scarlette Calico. 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 This remains worrisome for him as he should not take Triptan- medication since his presumed visual aura persisted for about 14 hours he was seen by one of his colleagues , Dr. Arlyss Queen, who initiated a stroke workup. 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 He is still taking Onglyza,. He is seen today because he had not had a repeat sleep study for the last 12 years. He has been on the same setting and on the same type CPAP machine. He is interested in reevaluating his sleep apnea and treatment options.He recalls that about 8 years ago he was on a medical mission and used his original CPAP machine on a car battery and it broke. He replaced the  the same type however at the same settings.   Interval history from 08/12/2015 [name] is here after his recent sleep study performed on 05/13/2015, during which he was diagnosed again with sleep apnea at an AHI of 49.8 no REM sleep was noted during the diagnostic part, supine AHI was 54.8 and nonsupine 27.3 average end tidal CO2 was 30.64 torr, no prolonged oxygen desaturations were noted. Occasional premature ventricular beats were seen, the patient was titrated to CPAP and was doing well on 11 cm water pressure. A 2 cm flex  function was requested. Today we also having  the opportunity to see the first download residual AHI is 2.6, at 11 cm water pressure with an average user time of 6 hours and 51 minutes and 100% compliance for days of use and over 4 hours of daily use. [name] would like an auto set function that can be used as he loses weight.  Sleep medical  history and family sleep history: Brother on CPAP, his "sister should be".  Social history: married, no tobacco use ever, no ETOH , caffeine user - plain iced tea , 3-4 glasses a day.  Interval history from 08/11/2016, as the pleasure of meeting with [name] today for a yearly compliance visit the patient has again reached 97% compliance with an average user time of 6 hours and 57 minutes the pressure set at 11 cm water with 2 cm EPR the residual AHI is 3.0 there are some air leaks but no major ones, there is no evidence of treatment emergent central apneas. He likes his machine and he feels that he is benefiting his sleep quality.  He has a new grand child.   Interval 08-30-2017, at the pleasure of meeting Ms. Dr. Shanon Brow [facility] today for his regularly CPAP compliance.  The patient recently went through a mission in [place] and hemostasis was able to use his CPAP.  However he has noted that when electricity was not provided or available to run his machine he was also not recharging his batteries.  He is able to sleep much better on the CPAP and would like to be able to use a CPAP on prolonged flights areas.  We will discuss a travel CPAP may be a refurbished travel CPAP that would be cheaper to purchase. His compliance download is as usual, excellent.  His machine is set at 11 cmH2O was 2 cm EPR, his residual AHI was 3.3 there are some moderate air leaks, use of time on average was 6 hours and 46 minutes 100% of days and 29 of these days over 4 hours consecutive views were documented establishing a compliance of 97%.  Review of Systems: Out of a complete 14  system review, the patient complains of only the following symptoms, and all other reviewed systems are negative.  The fatigue severity score was today endorsed at 13points and the Epworth sleepiness score at 7 points. 0 points were endorsed on the 15 point geriatric depression score.  Total knee replacement 4 years ago. Arthroscopic left knee 15 years ago. Knee pain while gardening, golf not affected.  Neck pain, especially on long flights.   How likely are you to doze in the following situations: 0 = not likely, 1 = slight chance, 2 = moderate chance, 3 = high chance  Sitting and Reading? Watching Television? Sitting inactive in a public place (theater or meeting)? Lying down in the afternoon when circumstances permit? Sitting and talking to someone? Sitting quietly after lunch without alcohol? In a car, while stopped for a few minutes in traffic? As a passenger in a car for an hour without a break?  Total =5 on CPAP.   100 % compliance.       Social History   Socioeconomic History  . Marital status: Married    Spouse name: Learta Codding  . Number of children: 4  . Years of education: 22  . Highest education level: Not on file  Occupational History  . Occupation: MD    Employer: [employer]  Tobacco Use  . Smoking status: Never Smoker  . Smokeless tobacco: Never Used  Substance and Sexual Activity  . Alcohol use: No    Alcohol/week: 0.0 standard drinks  . Drug use: No  . Sexual activity: Not on file  Other Topics Concern  . Not on file  Social History Narrative   Patient lives at home with family.   Caffeine Use: occasionally tea   Social  Determinants of Health   Financial Resource Strain: Not on file  Food Insecurity: Not on file  Transportation Needs: Not on file  Physical Activity: Not on file  Stress: Not on file  Social Connections: Not on file  Intimate Partner Violence: Not on file    Family History  Problem Relation Age of Onset  . Cancer Mother         Breast  . Stroke Mother   . Stroke Maternal Grandmother   . Stroke Maternal Aunt   . Stroke Maternal Uncle     Past Medical History:  Diagnosis Date  . Anemia   . Arthritis   . Diabetes mellitus   . Gout   . Hyperlipidemia   . Hypertension   . Localized osteoarthritis of left knee 11/20/2012  . Migraine headache with aura   . Neuromuscular disorder ([place])    " mild periferal neauropathy  . Osteoarthritis of left hip 11/20/2012  . PVC (premature ventricular contraction)   . Sleep apnea    cpap    Past Surgical History:  Procedure Laterality Date  . CHOLECYSTECTOMY  1997  . CVA     CVA, occiptal  . CVA     CVA, occipital  . knee arthoscopy  2006   Left Knee  . LEFT HEART CATH AND CORONARY ANGIOGRAPHY N/A 12/07/2017   Procedure: LEFT HEART CATH AND CORONARY ANGIOGRAPHY;  Surgeon: Troy Sine, MD;  Location: [facility];  Service: Cardiovascular;  Laterality: N/A;  . left total hip    . left unicarpmental knee    . LOOP RECORDER IMPLANT N/A 04/19/2013   Procedure: LOOP RECORDER IMPLANT;  Surgeon: Evans Lance, MD;  Location: [facility] CATH LAB;  Service: Cardiovascular;  Laterality: N/A;  . PARTIAL KNEE ARTHROPLASTY Left 11/20/2012   Procedure: UNICOMPARTMENTAL KNEE;  Surgeon: Johnny Bridge, MD;  Location: [facility];  Service: Orthopedics;  Laterality: Left;  . REPLACEMENT UNICONDYLAR JOINT KNEE Left 11/20/2012   Dr Mardelle Matte  . TEE WITHOUT CARDIOVERSION N/A 04/19/2013   Procedure: TRANSESOPHAGEAL ECHOCARDIOGRAM (TEE);  Surgeon: Larey Dresser, MD;  Location: [facility];  Service: Cardiovascular;  Laterality: N/A;  . TONSILLECTOMY AND ADENOIDECTOMY     Childhood  . TOTAL HIP ARTHROPLASTY Left 11/20/2012   Dr Mardelle Matte  . TOTAL HIP ARTHROPLASTY Left 11/20/2012   Procedure: TOTAL HIP ARTHROPLASTY;  Surgeon: Johnny Bridge, MD;  Location: [facility];  Service: Orthopedics;  Laterality: Left;    Current Outpatient Medications  Medication Sig Dispense Refill  . acetaminophen  (TYLENOL) 650 MG CR tablet Take 1,300 mg by mouth every 8 (eight) hours as needed for pain.    . Alcohol Swabs PADS Use to test up to 4 times daily 100 each 2  . allopurinol (ZYLOPRIM) 300 MG tablet Take 1 tablet (300 mg total) by mouth daily. 90 tablet 3  . blood glucose meter kit and supplies KIT Use up to four times daily as directed. Dx E13.40. 1 each 0  . Blood Glucose Monitoring Suppl (FREESTYLE LITE) DEVI Use to test up to 4 times daily 1 each 0  . BYDUREON BCISE 2 MG/0.85ML AUIJ INJECT 2 MG INTO THE SKIN ONCE A WEEK. 10.2 mL 3  . ciprofloxacin (CIPRO) 500 MG tablet Take 1 tablet (500 mg total) by mouth 2 (two) times daily. 20 tablet 0  . clopidogrel (PLAVIX) 75 MG tablet Take 1 tablet (75 mg total) by mouth daily with breakfast. 90 tablet 3  . diclofenac sodium (VOLTAREN) 1 %  GEL Apply 1 application topically 2 (two) times daily as needed (for pain).    [name] doxycycline (VIBRAMYCIN) 100 MG capsule TAKE 1 CAPSULE BY MOUTH DAILY. USE FOR MALARIA PROPHYLAXIS 60 capsule 0  . glucose blood (FREESTYLE LITE) test strip USE TO TEST UP TO 4 TIMES DAILY 100 each 12  . hydrocortisone-pramoxine (ANALPRAM HC) 2.5-1 % rectal cream Place 1 application rectally 3 (three) times daily. 30 g 0  . Insulin Glargine (BASAGLAR KWIKPEN) 100 UNIT/ML Inject 0.22 mLs (22 Units total) into the skin daily. (Patient taking differently: Inject 30 Units into the skin daily.) 15 mL 9  . Insulin Pen Needle (PEN NEEDLES) 32G X 5 MM MISC 1 each by Does not apply route daily. Use for basal insulin injection 100 each 6  . INVOKANA 300 MG TABS tablet TAKE 1 TABLET BY MOUTH ONCE DAILY 90 tablet 1  . losartan (COZAAR) 100 MG tablet Take 1 tablet (100 mg total) by mouth daily. 90 tablet 3  . metaxalone (SKELAXIN) 800 MG tablet Take 1 tablet (800 mg total) by mouth every 6 (six) hours as needed. 60 tablet 1  . metFORMIN (GLUCOPHAGE-XR) 500 MG 24 hr tablet Take 2 tablets (1,000 mg total) by mouth 2 (two) times daily. 360 tablet 3  .  nebivolol (BYSTOLIC) 10 MG tablet Take 1 tablet (10 mg total) by mouth every evening. 90 tablet 3  . rosuvastatin (CRESTOR) 5 MG tablet TAKE 1 TABLET (5 MG TOTAL) BY MOUTH EVERY MONDAY, WEDNESDAY, AND FRIDAY. 39 tablet 4  . SM LANCETS 33G MISC USE TO TEST BLOOD SUGAR TWICE DAILY 400 each PRN  . tamsulosin (FLOMAX) 0.4 MG CAPS capsule TAKE 1 CAPSULE BY MOUTH ONCE DAILY 90 capsule 3  . zolpidem (AMBIEN) 5 MG tablet Take 1 tablet (5 mg total) by mouth at bedtime as needed. for sleep 30 tablet 2   No current facility-administered medications for this visit.    Allergies as of 06/30/2020 - Review Complete 06/30/2020  Allergen Reaction Noted  . Sulfa antibiotics Other (See Comments) 06/01/2011    Vitals: BP (!) 146/82   Pulse 71   Ht 6' (1.829 m)   Wt 233 lb (105.7 kg)   BMI 31.60 kg/m  Last Weight:  Wt Readings from Last 1 Encounters:  06/30/20 233 lb (105.7 kg)   QTM:AUQJ mass index is 31.6 kg/m.     Last Height:   Ht Readings from Last 1 Encounters:  06/30/20 6' (1.829 m)   Well groomed- mo ankle edema, General: The patient is awake, alert and appears not in acute distress. The patient is well groomed. Head: Normocephalic, atraumatic. Neck is supple.  Mallampati 1, the airway is short and ends abruptly is not narrow.  The palate is of normal shape the uvula is midline the tongue is midline without fasciculation or tremor. neck circumference: 17. 5"  Nasal airflow unrestricted , Mild Retrognathia is seen.  Neurologic exam : The patient is awake and alert, oriented to place and time.   Attention span & concentration ability appears normal.  Speech is fluent,  without dysarthria, dysphonia or aphasia. He is speaking as if short of breath.   Mood and affect are appropriate. Cranial nerves: no loss of smell or taste.  Pupils are equal -Facial motor strength is symmetric and tongue and uvula move midline. Shoulder shrug was symmetrical.  Sensory: intact touch and vibration.  Deep  tendon reflexes: in the  upper and lower extremities are symmetric and intact. Babinski up-going, and painful!  Hypersensitive planta pedis.   A/P: Patient reports he had one night being miserable because the CPAP could not be used that night- he had left the tubing in a hotel room in [place], he felt an immediate decrement in sleep quality.   He uses a ResMed nasal pillows.  He does have facial hair so this is actually a better seal.  He has used the machine for the last 30 days 100% and every night over 4 hours which makes him 100% compliant user with an average user time of 7 hours 23 minutes.  The set pressure at this time is 11 cmH2O was 2 cm expiratory pressure relief setting heated humidification and a residual AHI of 3.7.  No central apneas are emerging he does have some unknown apneas which usually means that there is some air leakage that he nurse the machine to recognize if central or obstructive.  The 95th percentile air leak is quite high at 44 L.  However his apnea index seems to be keeping low.     1) Assesment ; OSA on CPAP, highly compliant user, 100%, needs a new machine and new baseline. He is travelling to [place] for a mission trip , later this week. HST ASAP ? I spoke to him about Inspire, the BMI requirement, the  Patient group that is most likely to benefit. 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    2) Diabetic neuropathy with hyperpathia in both feet, preserved vibration and fine touch. He is not interested in any gabapentin.     Follow Up Instructions: He has an over 41 year old CPAP machine and this should be replaced.  He will come to have either a PSG or HST in order to re-established a baseline.  Prefers HST ASAP, was interested to learn about INSPIRE but is  Preferring CPAP for now.  He needs a new machine.  I discussed the assessment and treatment plan with the patient. The patient was provided an opportunity to  ask questions and all were answered. The patient agreed with the plan and demonstrated an understanding of the instructions.   The patient was advised to call back or seek an in-person evaluation if the symptoms worsen or if the condition fails to improve as anticipated.  I provided 22 minutes of -face-to-face time during this encounter.   Larey Seat, MD  06/30/2020   CC: Darreld Mclean, Md [address] [place],  [place] [address]   CC: Dr Carola Frost- [place] service.

## 2020-07-01 ENCOUNTER — Ambulatory Visit (INDEPENDENT_AMBULATORY_CARE_PROVIDER_SITE_OTHER): Payer: 59 | Admitting: Neurology

## 2020-07-01 ENCOUNTER — Encounter: Payer: Self-pay | Admitting: Neurology

## 2020-07-01 DIAGNOSIS — I2510 Atherosclerotic heart disease of native coronary artery without angina pectoris: Secondary | ICD-10-CM

## 2020-07-01 DIAGNOSIS — E1140 Type 2 diabetes mellitus with diabetic neuropathy, unspecified: Secondary | ICD-10-CM

## 2020-07-01 DIAGNOSIS — I493 Ventricular premature depolarization: Secondary | ICD-10-CM

## 2020-07-01 DIAGNOSIS — Z20822 Contact with and (suspected) exposure to covid-19: Secondary | ICD-10-CM | POA: Diagnosis not present

## 2020-07-01 DIAGNOSIS — G4733 Obstructive sleep apnea (adult) (pediatric): Secondary | ICD-10-CM | POA: Diagnosis not present

## 2020-07-01 DIAGNOSIS — Z9989 Dependence on other enabling machines and devices: Secondary | ICD-10-CM

## 2020-07-01 NOTE — Addendum Note (Signed)
Addended by: Larey Seat on: 07/01/2020 12:21 PM   Modules accepted: Orders

## 2020-07-01 NOTE — Progress Notes (Signed)
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   RECOMMENDATION: The patient is due for a new CPAP, his OSA is severe at an AHI of 48.5/h and he has intermittent bradycardia with apnea, but no prolonged hypoxia, 02 Nadir was 80%%. I will order an auto CPAP with a pressure window from 6-15 cm water, 3 cm EPR and mask of his choice., he was using an airfit p10 but may need a more substantial headgear ( air leaks) . Consider bella interface, please fit patient.    INTERPRETING PHYSICIAN:  Larey Seat, MD

## 2020-07-01 NOTE — Progress Notes (Signed)
   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  He has a history of type 2 diabetes with neuropathy, and he has a history of a stroke 2016.  His CPAP treatment started 5 years ago and he is due for new machine as of January 11- he had been on CPAP for over 15 years prior to our sleep involvement.  So at this time before he will fully retire he would like to have a replacement machine and he is due now- I will order a home sleep test just to have the current level of apnea documented, I will send an order for auto titration CPAP parallel which means that the machine can adjust itself between the pressure setting that I will be determining.   I would like to make sure that he has supplies until the new machine arrives.  He uses a ResMed nasal pillows.  He does have facial hair so this is actually a better seal.  He has used the machine for the last 30 days every night and over 4 hours which makes him 100% compliant user- with an average user time of 7 hours 23 minutes.  The set pressure at this time is 11 cmH2O with 2 cm expiratory pressure relief and a residual AHI of 3.7/h.  No central apneas are emerging he does have some unknown apneas which usually means that there is some air leakage.  The 95th percentile air leak is quite high at 44 L.    Epworth sleepiness score: 5/24.  BMI: 31.7 kg/m  Neck Circumference: 17.5 "  FINDINGS:   Total Record Time (hours, min): 5 h 47 min Total Sleep Time (hours, min):  4 h 21 min   Percent REM (%):    9.9 %   Calculated pAHI (per hour): 48.5      REM pAHI: N/A    NREM pAHI: N/A Supine AHI:47.3/h   Oxygen Saturation (%) Mean: 93  Minimum oxygen saturation (%):        80   O2 Saturation Range  (%): 80-98  O2Saturation (minutes) <=88%: 5.8 min   Pulse Mean (bpm):    69  Pulse Range (35-89)   IMPRESSION: This HST confirmed the presence of severe OSA (obstructive sleep apnea) and need to continue CPAP therapy.   RECOMMENDATION: The patient is due for a new CPAP, his OSA is severe at an AHI of 48.5/h and he has intermittent bradycardia with apnea, but no prolonged hypoxia, 02 Nadir was 80%%. I will order an auto CPAP with a pressure window from 6-15 cm water, 3 cm EPR and mask of his choice., he was using an airfit p10 but may need a more substantial headgear ( air leaks) . Consider bella interface, please fit patient.    INTERPRETING PHYSICIAN:  Larey Seat, MD   [facility] Sleep Board certified by The [employer] of Sleep Medicine and Fellow of the [employer] of Neurology. Medical Director of [employer].

## 2020-07-01 NOTE — Procedures (Signed)
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  He has a history of type 2 diabetes with neuropathy, and he has a history of a stroke 2016.  His CPAP treatment started 5 years ago and he is due for new machine as of January 11- he had been on CPAP for over 15 years prior to our sleep involvement.  So at this time before he will fully retire he would like to have a replacement machine and he is due now- I will order a home sleep test just to have the current level of apnea documented, I will send an order for auto titration CPAP parallel which means that the machine can adjust itself between the pressure setting that I will be determining.   I would like to make sure that he has supplies until the new machine arrives.  He uses a ResMed nasal pillows.  He does have facial hair so this is actually a better seal.  He has used the machine for the last 30 days every night and over 4 hours which makes him 100% compliant user- with an average user time of 7 hours 23 minutes.  The set pressure at this time is 11 cmH2O with 2 cm expiratory pressure relief and a residual AHI of 3.7/h.  No central apneas are emerging he does have some unknown apneas which usually means that there is some air leakage.  The 95th percentile air leak is quite high at 44 L.    Epworth sleepiness score: 5/24.  BMI: 31.7 kg/m  Neck Circumference: 17.5 "  FINDINGS:   Total Record Time (hours, min): 5 h 47 min Total Sleep Time (hours, min):  4 h 21 min   Percent REM (%):    9.9 %   Calculated pAHI (per hour): 48.5      REM pAHI: N/A    NREM pAHI: N/A Supine AHI:47.3/h   Oxygen Saturation (%) Mean: 93  Minimum oxygen saturation (%):        80   O2 Saturation Range (%):  80-98  O2Saturation (minutes) <=88%: 5.8 min   Pulse Mean (bpm):    69  Pulse Range (35-89)   IMPRESSION: This HST confirmed the presence of severe OSA (obstructive sleep apnea) and need to continue CPAP therapy.   RECOMMENDATION: The patient is due for a new CPAP, his OSA is severe at an AHI of 48.5/h and he has intermittent bradycardia with apnea, but no prolonged hypoxia, 02 Nadir was 80%%. I will order an auto CPAP with a pressure window from 6-15 cm water, 3 cm EPR and mask of his choice., he was using an airfit p10 but may need a more substantial headgear ( air leaks) . Consider bella interface, please fit patient.    INTERPRETING PHYSICIAN:  Larey Seat, MD   [facility] Sleep Board certified by The [employer] of Sleep Medicine and Fellow of the [employer] of Neurology. Medical Director of [employer].

## 2020-07-02 MED FILL — CLOPIDOGREL 75 MG TABLET: 75 | 90 days supply | Qty: 90 | Fill #0

## 2020-07-06 ENCOUNTER — Other Ambulatory Visit: Payer: Self-pay | Admitting: Family Medicine

## 2020-07-06 DIAGNOSIS — E119 Type 2 diabetes mellitus without complications: Secondary | ICD-10-CM

## 2020-07-06 MED FILL — LOSARTAN POTASSIUM 50 MG TA: 50 | 30 days supply | Qty: 60 | Fill #1

## 2020-07-06 MED FILL — INVOKANA 300 MG TABLET: 300 | 90 days supply | Qty: 90 | Fill #0

## 2020-07-08 DIAGNOSIS — G4733 Obstructive sleep apnea (adult) (pediatric): Secondary | ICD-10-CM | POA: Diagnosis not present

## 2020-07-08 MED FILL — BYDUREON BCise 2 MG/0.85ML: 2 | 84 days supply | Qty: 10 | Fill #1

## 2020-07-13 ENCOUNTER — Other Ambulatory Visit: Payer: Self-pay | Admitting: Family Medicine

## 2020-07-13 DIAGNOSIS — N401 Enlarged prostate with lower urinary tract symptoms: Secondary | ICD-10-CM

## 2020-07-24 ENCOUNTER — Other Ambulatory Visit: Payer: Self-pay | Admitting: Family Medicine

## 2020-07-24 ENCOUNTER — Other Ambulatory Visit (HOSPITAL_BASED_OUTPATIENT_CLINIC_OR_DEPARTMENT_OTHER): Payer: Self-pay

## 2020-07-24 DIAGNOSIS — G4700 Insomnia, unspecified: Secondary | ICD-10-CM

## 2020-07-24 MED FILL — ZOLPIDEM TARTRATE 5 MG TABS: 5 | 30 days supply | Qty: 30 | Fill #0

## 2020-07-29 ENCOUNTER — Other Ambulatory Visit (HOSPITAL_COMMUNITY): Payer: Self-pay | Admitting: *Deleted

## 2020-07-30 MED FILL — LOSARTAN POTASSIUM 50 MG TA: 50 | 30 days supply | Qty: 60 | Fill #2

## 2020-08-04 ENCOUNTER — Other Ambulatory Visit: Payer: Self-pay

## 2020-08-04 ENCOUNTER — Ambulatory Visit (HOSPITAL_BASED_OUTPATIENT_CLINIC_OR_DEPARTMENT_OTHER)
Admission: RE | Admit: 2020-08-04 | Discharge: 2020-08-04 | Disposition: A | Discharge status: Home / Self Care | Payer: 59 | Source: Ambulatory Visit | Attending: Cardiology | Admitting: Cardiology

## 2020-08-06 ENCOUNTER — Other Ambulatory Visit (HOSPITAL_COMMUNITY): Payer: Self-pay

## 2020-08-12 ENCOUNTER — Encounter: Payer: Self-pay | Admitting: Family Medicine

## 2020-08-13 ENCOUNTER — Other Ambulatory Visit (HOSPITAL_COMMUNITY): Payer: Self-pay

## 2020-08-13 MED FILL — Nebivolol HCl Tab 10 MG (Base Equivalent): ORAL | 90 days supply | Qty: 90 | Fill #0 | Status: CN

## 2020-08-13 MED FILL — Insulin Glargine-yfgn Soln Pen-Injector 100 Unit/ML: SUBCUTANEOUS | 50 days supply | Qty: 15 | Fill #0 | Status: AC

## 2020-08-14 ENCOUNTER — Other Ambulatory Visit (HOSPITAL_COMMUNITY): Payer: Self-pay

## 2020-08-17 ENCOUNTER — Other Ambulatory Visit (HOSPITAL_COMMUNITY): Payer: Self-pay

## 2020-08-19 ENCOUNTER — Other Ambulatory Visit (HOSPITAL_COMMUNITY): Payer: Self-pay

## 2020-08-26 ENCOUNTER — Other Ambulatory Visit (HOSPITAL_COMMUNITY): Payer: Self-pay

## 2020-08-29 ENCOUNTER — Other Ambulatory Visit (HOSPITAL_COMMUNITY): Payer: Self-pay

## 2020-08-29 MED FILL — Losartan Potassium Tab 50 MG: ORAL | 90 days supply | Qty: 90 | Fill #0 | Status: AC

## 2020-08-29 MED FILL — Zolpidem Tartrate Tab 5 MG: ORAL | 30 days supply | Qty: 30 | Fill #0 | Status: AC

## 2020-08-31 ENCOUNTER — Other Ambulatory Visit (HOSPITAL_COMMUNITY): Payer: Self-pay
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
## 2020-09-07 ENCOUNTER — Ambulatory Visit: Payer: 59 | Admitting: Family Medicine

## 2020-09-07 ENCOUNTER — Encounter: Payer: Self-pay | Admitting: Family Medicine

## 2020-09-07 ENCOUNTER — Other Ambulatory Visit: Payer: Self-pay

## 2020-09-07 VITALS — BP 132/82 | HR 58 | Resp 18 | Ht 72.0 in | Wt 226.0 lb

## 2020-09-07 DIAGNOSIS — E119 Type 2 diabetes mellitus without complications: Secondary | ICD-10-CM

## 2020-09-07 DIAGNOSIS — R1310 Dysphagia, unspecified: Secondary | ICD-10-CM

## 2020-09-07 DIAGNOSIS — G4733 Obstructive sleep apnea (adult) (pediatric): Secondary | ICD-10-CM | POA: Diagnosis not present

## 2020-09-07 DIAGNOSIS — Z9989 Dependence on other enabling machines and devices: Secondary | ICD-10-CM | POA: Diagnosis not present

## 2020-09-07 DIAGNOSIS — E782 Mixed hyperlipidemia: Secondary | ICD-10-CM | POA: Diagnosis not present

## 2020-09-07 DIAGNOSIS — I10 Essential (primary) hypertension: Secondary | ICD-10-CM

## 2020-09-07 DIAGNOSIS — Q254 Congenital malformation of aorta unspecified: Secondary | ICD-10-CM

## 2020-09-07 DIAGNOSIS — M1000 Idiopathic gout, unspecified site: Secondary | ICD-10-CM

## 2020-09-07 DIAGNOSIS — Z7721 Contact with and (suspected) exposure to potentially hazardous body fluids: Secondary | ICD-10-CM | POA: Diagnosis not present

## 2020-09-07 LAB — COMPREHENSIVE METABOLIC PANEL
ALT: 22 U/L (ref 0–53)
AST: 16 U/L (ref 0–37)
Albumin: 4.4 g/dL (ref 3.5–5.2)
Alkaline Phosphatase: 59 U/L (ref 39–117)
BUN: 20 mg/dL (ref 6–23)
CO2: 28 mEq/L (ref 19–32)
Calcium: 9.8 mg/dL (ref 8.4–10.5)
Chloride: 103 mEq/L (ref 96–112)
Creatinine, Ser: 1.1 mg/dL (ref 0.40–1.50)
GFR: 67.47 mL/min (ref >60.00)
Glucose, Bld: 148 mg/dL — ABNORMAL HIGH (ref 70–99)
Potassium: 4.4 mEq/L (ref 3.5–5.1)
Sodium: 138 mEq/L (ref 135–145)
Total Bilirubin: 0.6 mg/dL (ref 0.2–1.2)
Total Protein: 6.7 g/dL (ref 6.0–8.3)

## 2020-09-07 LAB — LIPID PANEL
Cholesterol: 115 mg/dL (ref 0–200)
HDL: 46.6 mg/dL (ref >39.00)
LDL Cholesterol: 45 mg/dL (ref 0–99)
NonHDL: 68.62
Total CHOL/HDL Ratio: 2
Triglycerides: 116 mg/dL (ref 0.0–149.0)
VLDL: 23.2 mg/dL (ref 0.0–40.0)

## 2020-09-07 LAB — HEMOGLOBIN A1C: Hgb A1c MFr Bld: 7.9 % — ABNORMAL HIGH (ref 4.6–6.5)

## 2020-09-07 LAB — CBC
HCT: 41.5 % (ref 39.0–52.0)
Hemoglobin: 13.9 g/dL (ref 13.0–17.0)
MCHC: 33.4 g/dL (ref 30.0–36.0)
MCV: 93.4 fl (ref 78.0–100.0)
Platelets: 259 10*3/uL (ref 150.0–400.0)
RBC: 4.44 Mil/uL (ref 4.22–5.81)
RDW: 12.8 % (ref 11.5–15.5)
WBC: 4.7 10*3/uL (ref 4.0–10.5)

## 2020-09-07 NOTE — Patient Instructions (Addendum)
Great to see you again today- I will be in touch with your labs aspa  I will refer you back to Dr Benson Norway to discuss colon and  Will also order CTA to eval possible aortic dilatation can do at the [facility] if you like

## 2020-09-08 ENCOUNTER — Encounter: Payer: Self-pay | Admitting: Family Medicine

## 2020-09-08 ENCOUNTER — Other Ambulatory Visit (HOSPITAL_COMMUNITY): Payer: Self-pay

## 2020-09-08 LAB — HIV ANTIBODY (ROUTINE TESTING W REFLEX): HIV 1&2 Ab, 4th Generation: NONREACTIVE

## 2020-09-08 MED FILL — Exenatide Extended Release Susp Auto-Injector 2 MG/0.85ML: SUBCUTANEOUS | 84 days supply | Qty: 10.2 | Fill #0 | Status: AC

## 2020-09-14 ENCOUNTER — Other Ambulatory Visit (HOSPITAL_COMMUNITY): Payer: Self-pay

## 2020-09-16 ENCOUNTER — Other Ambulatory Visit (HOSPITAL_COMMUNITY): Payer: Self-pay

## 2020-09-16 MED FILL — Nebivolol HCl Tab 10 MG (Base Equivalent): ORAL | 90 days supply | Qty: 90 | Fill #0 | Status: AC

## 2020-09-16 MED FILL — Rosuvastatin Calcium Tab 5 MG: ORAL | 90 days supply | Qty: 39 | Fill #0 | Status: AC

## 2020-09-16 MED FILL — Allopurinol Tab 300 MG: ORAL | 90 days supply | Qty: 90 | Fill #0 | Status: AC

## 2020-09-23 ENCOUNTER — Other Ambulatory Visit: Payer: Self-pay

## 2020-09-23 ENCOUNTER — Ambulatory Visit
Admission: RE | Admit: 2020-09-23 | Discharge: 2020-09-23 | Disposition: A | Discharge status: Home / Self Care | Payer: 59 | Source: Ambulatory Visit | Attending: Family Medicine | Admitting: Family Medicine

## 2020-09-23 DIAGNOSIS — I712 Thoracic aortic aneurysm, without rupture: Secondary | ICD-10-CM | POA: Diagnosis not present

## 2020-09-23 DIAGNOSIS — Q254 Congenital malformation of aorta unspecified: Secondary | ICD-10-CM

## 2020-09-23 DIAGNOSIS — I700 Atherosclerosis of aorta: Secondary | ICD-10-CM | POA: Diagnosis not present

## 2020-09-23 DIAGNOSIS — I2510 Atherosclerotic heart disease of native coronary artery without angina pectoris: Secondary | ICD-10-CM | POA: Diagnosis not present

## 2020-09-23 MED ORDER — IOPAMIDOL (ISOVUE-300) INJECTION 61%
75.0000 mL | Freq: Once | INTRAVENOUS | Status: AC | PRN
  Administered 2020-09-23: 75 mL via INTRAVENOUS

## 2020-09-29 ENCOUNTER — Encounter: Payer: Self-pay | Admitting: Family Medicine

## 2020-09-29 ENCOUNTER — Other Ambulatory Visit (HOSPITAL_COMMUNITY): Payer: Self-pay

## 2020-09-29 MED FILL — Tamsulosin HCl Cap 0.4 MG: ORAL | 90 days supply | Qty: 90 | Fill #0 | Status: AC

## 2020-09-29 MED FILL — Clopidogrel Bisulfate Tab 75 MG (Base Equiv): ORAL | 90 days supply | Qty: 90 | Fill #0 | Status: AC

## 2020-09-29 MED FILL — Canagliflozin Tab 300 MG: ORAL | 90 days supply | Qty: 90 | Fill #0 | Status: AC

## 2020-09-29 MED FILL — Insulin Glargine-yfgn Soln Pen-Injector 100 Unit/ML: SUBCUTANEOUS | 50 days supply | Qty: 15 | Fill #1 | Status: AC

## 2020-10-03 ENCOUNTER — Other Ambulatory Visit (HOSPITAL_COMMUNITY): Payer: Self-pay

## 2020-10-07 ENCOUNTER — Encounter: Payer: Self-pay | Admitting: Family Medicine

## 2020-10-09 ENCOUNTER — Other Ambulatory Visit (HOSPITAL_COMMUNITY): Payer: Self-pay

## 2020-11-06 ENCOUNTER — Other Ambulatory Visit (HOSPITAL_COMMUNITY): Payer: Self-pay

## 2020-12-17 DIAGNOSIS — G4733 Obstructive sleep apnea (adult) (pediatric): Secondary | ICD-10-CM | POA: Diagnosis not present

## 2020-12-31 ENCOUNTER — Encounter: Payer: Self-pay | Admitting: Family Medicine

## 2020-12-31 DIAGNOSIS — E119 Type 2 diabetes mellitus without complications: Secondary | ICD-10-CM

## 2021-01-01 ENCOUNTER — Telehealth: Payer: Self-pay | Admitting: Family Medicine

## 2021-01-01 MED ORDER — LANTUS SOLOSTAR 100 UNIT/ML ~~LOC~~ SOPN: 30.0000 [IU] | PEN_INJECTOR | Freq: Every day | SUBCUTANEOUS | 99 refills | Status: DC

## 2021-01-01 NOTE — Telephone Encounter (Signed)
Patient sent mychart essage to provider.

## 2021-01-01 NOTE — Telephone Encounter (Signed)
Pt called in and stated his insurance [facility] that was sent in but will accept: Lantus 100 u/ml 3 ml self injection syringes.   Pharmacy: [facility] [id number] [address], [address] AT [facility] [address]  [address] [place] [address]  Phone:  [phone]

## 2021-01-11 ENCOUNTER — Ambulatory Visit: Payer: 59 | Admitting: Family Medicine

## 2021-01-17 DIAGNOSIS — G4733 Obstructive sleep apnea (adult) (pediatric): Secondary | ICD-10-CM | POA: Diagnosis not present

## 2021-01-19 NOTE — Progress Notes (Addendum)
[place] at [facility] [address], [place], [place] [address] [phone]  Date:  01/20/2021   Name:  Phillip [name]   DOB:  25-Oct-1950   MRN:  [id number]  PCP:  Darreld Mclean, MD    Chief Complaint: 4 month follow up (Concerns/ questions: Cmp instead of BMP for abd pain. /Flu shot today: already had this and booster number 2)   History of Present Illness:  Phillip [name] is a 70 y.o. very pleasant male patient who presents with the following:  Jaecion is seen today for CPE-  diabetes recheck.   History of diabetes with neuropathy, CAD, CVA in 2014 with full recovery, sleep apnea, dyslipidemia, hypertension Most recent visit with myself was in May  Married to [place], 4 children and several grandchildren-everyone is doing well  Shingrix- he is still looking at doing this Flu shot- done  Updated COVID booster- done  Most recent A1c showed improvement, decreased from 8.2% Full labs done in May-cholesterol very good He notes am glucose looks good He is using lantus 30, invokana, Bcise, metformin  CT scan done to follow-up possible aortic aneurysm noted incidentally on coronary calcium scan.  Radiology recommended follow-up in 2 to 3 years for stability IMPRESSION: 1. The tubular ascending thoracic aorta measures up to 3.9 x 3.9 cm in caliber, at the upper limit of normal in size. The descending thoracic aorta is normal in caliber and measures up to 2.8 x 2.6 cm. 2. Coronary artery disease. Lab Results  Component Value Date   HGBA1C 7.9 (H) 09/07/2020   Pt has noted an occasional RUQ pain for the last 3 months- may occur every 2 days.  Will seem to occur if he moves a certain way.  May go away if he "thumps" on it with his hand No jaundice No vomiting Might be worse if he has more gas Feels better if he leans towards the right  Chole done in 1997  He is double checking with Dr Benson Norway about his last colonoscopy to make  sure this is not currently due No SOB  Patient Active Problem List   Diagnosis Date Noted   Severe obstructive sleep apnea-hypopnea syndrome 07/01/2020   Dependence on CPAP ventilation 07/01/2020   Type 2 diabetes mellitus with diabetic neuropathy, without long-term current use of insulin ([place]) 09/02/2019   OSA and COPD overlap syndrome ([place]) 09/02/2019   Abnormal hemoglobin ([facility]) 09/02/2019   OSA on CPAP 08/12/2015   Ventricular tachycardia ([place]) 11/10/2014   CAD (coronary artery disease), native coronary artery    Variants of migraine, not elsewhere classified, without mention of intractable migraine without mention of status migrainosus 08/14/2013   History of ischemic stroke without residual deficits 04/17/2013   ED (erectile dysfunction) 02/01/2012   Gout 09/05/2011   Hyperlipidemia 09/05/2011   Hypertriglyceridemia 09/05/2011   PVC (premature ventricular contraction) 09/05/2011   Type 2 diabetes, controlled, with neuropathy ([place]) 06/01/2011   Essential hypertension, benign 06/01/2011    Past Medical History:  Diagnosis Date   Anemia    Arthritis    Diabetes mellitus    Gout    Hyperlipidemia    Hypertension    Localized osteoarthritis of left knee 11/20/2012   Migraine headache with aura    Neuromuscular disorder ([place])    " mild periferal neauropathy   Osteoarthritis of left hip 11/20/2012   PVC (premature ventricular contraction)    Sleep apnea    cpap  Past Surgical History:  Procedure Laterality Date   CHOLECYSTECTOMY  1997   CVA     CVA, occiptal   CVA     CVA, occipital   knee arthoscopy  2006   Left Knee   LEFT HEART CATH AND CORONARY ANGIOGRAPHY N/A 12/07/2017   Procedure: LEFT HEART CATH AND CORONARY ANGIOGRAPHY;  Surgeon: Troy Sine, MD;  Location: [facility];  Service: Cardiovascular;  Laterality: N/A;   left total hip     left unicarpmental knee     LOOP RECORDER IMPLANT N/A 04/19/2013   Procedure: LOOP RECORDER IMPLANT;  Surgeon:  Evans Lance, MD;  Location: [facility] C[facility];  Service: Cardiovascular;  Laterality: N/A;   PARTIAL KNEE ARTHROPLASTY Left 11/20/2012   Procedure: UNICOMPARTMENTAL KNEE;  Surgeon: Johnny Bridge, MD;  Location: [facility];  Service: Orthopedics;  Laterality: Left;   REPLACEMENT UNICONDYLAR JOINT KNEE Left 11/20/2012   Dr Mardelle Matte   TEE WITHOUT CARDIOVERSION N/A 04/19/2013   Procedure: TRANSESOPHAGEAL ECHOCARDIOGRAM (TEE);  Surgeon: Larey Dresser, MD;  Location: [facility];  Service: Cardiovascular;  Laterality: N/A;   TONSILLECTOMY AND ADENOIDECTOMY     Childhood   TOTAL HIP ARTHROPLASTY Left 11/20/2012   Dr Mardelle Matte   TOTAL HIP ARTHROPLASTY Left 11/20/2012   Procedure: TOTAL HIP ARTHROPLASTY;  Surgeon: Johnny Bridge, MD;  Location: [facility];  Service: Orthopedics;  Laterality: Left;    Social History   Tobacco Use   Smoking status: Never   Smokeless tobacco: Never  Substance Use Topics   Alcohol use: No    Alcohol/week: 0.0 standard drinks   Drug use: No    Family History  Problem Relation Age of Onset   Cancer Mother        Breast   Stroke Mother    Stroke Maternal Grandmother    Stroke Maternal Aunt    Stroke Maternal Uncle     Allergies  Allergen Reactions   Sulfa Antibiotics Other (See Comments)    Unknown reaction    Medication list has been reviewed and updated.  Current Outpatient Medications on File Prior to Visit  Medication Sig Dispense Refill   acetaminophen (TYLENOL) 650 MG CR tablet Take 1,300 mg by mouth every 8 (eight) hours as needed for pain.     Alcohol Swabs PADS Use to test up to 4 times daily 100 each 2   allopurinol (ZYLOPRIM) 300 MG tablet TAKE 1 TABLET BY MOUTH DAILY. 90 tablet 3   blood glucose meter kit and supplies KIT Use up to four times daily as directed. Dx E13.40. 1 each 0   Blood Glucose Monitoring Suppl (FREESTYLE LITE) DEVI Use to test up to 4 times daily 1 each 0   clopidogrel (PLAVIX) 75 MG tablet TAKE 1 TABLET (75 MG TOTAL) BY MOUTH  DAILY WITH BREAKFAST. 90 tablet 3   diclofenac sodium (VOLTAREN) 1 % GEL Apply 1 application topically 2 (two) times daily as needed (for pain).     doxycycline (VIBRAMYCIN) 100 MG capsule TAKE 1 CAPSULE BY MOUTH DAILY. USE FOR MALARIA PROPHYLAXIS 60 capsule 0   glucose blood (FREESTYLE LITE) test strip USE TO TEST UP TO 4 TIMES DAILY 100 each 12   hydrocortisone-pramoxine (ANALPRAM HC) 2.5-1 % rectal cream Place 1 application rectally 3 (three) times daily. 30 g 0   insulin glargine (LANTUS SOLOSTAR) 100 UNIT/ML Solostar Pen Inject 30 Units into the skin daily. 15 mL PRN   Insulin Pen Needle (PEN NEEDLES) 32G X 5  MM MISC 1 each by Does not apply route daily. Use for basal insulin injection 100 each 6   losartan (COZAAR) 50 MG tablet TAKE 2 TABLETS (100 MG) BY MOUTH ONCE A DAY 600 tablet 0   metaxalone (SKELAXIN) 800 MG tablet Take 1 tablet (800 mg total) by mouth every 6 (six) hours as needed. 60 tablet 1   metFORMIN (GLUCOPHAGE-XR) 500 MG 24 hr tablet Take 2 tablets (1,000 mg total) by mouth 2 (two) times daily. 360 tablet 3   SM LANCETS 33G MISC USE TO TEST BLOOD SUGAR TWICE DAILY 400 each PRN   tamsulosin (FLOMAX) 0.4 MG CAPS capsule TAKE 1 CAPSULE BY MOUTH ONCE DAILY 90 capsule 3   rosuvastatin (CRESTOR) 5 MG tablet TAKE 1 TABLET (5 MG TOTAL) BY MOUTH EVERY MONDAY, WEDNESDAY, AND FRIDAY. 39 tablet 4   No current facility-administered medications on file prior to visit.    Review of Systems:  As per HPI- otherwise negative.   Physical Examination: Vitals:   01/20/21 1604 01/20/21 1627  BP: (!) 162/88 (!) 142/85  Pulse: 71   Resp: 18   Temp: 97.9 F (36.6 C)   SpO2: 96%    Vitals:   01/20/21 1604  Weight: 230 lb 6.4 oz (104.5 kg)  Height: 6' (1.829 m)   Body mass index is 31.25 kg/m. Ideal Body Weight: Weight in (lb) to have BMI = 25: 183.9  GEN: no acute distress. Overweight, looks well  HEENT: Atraumatic, Normocephalic.  Ears and Nose: No external deformity. CV: RRR,  No M/G/R. No JVD. No thrill. No extra heart sounds. PULM: CTA B, no wheezes, crackles, rhonchi. No retractions. No resp. distress. No accessory muscle use. ABD: S, NT, ND, +BS. No rebound. No HSM.  Cannot reproduce right upper quadrant tenderness or right rib tenderness at this time EXTR: No c/c/e PSYCH: Normally interactive. Conversant.    Assessment and Plan: Controlled type 2 diabetes mellitus without complication, without long-term current use of insulin ([place]) - Plan: Hemoglobin A1c, Comprehensive metabolic panel, Exenatide ER 2 MG/0.85ML AUIJ, canagliflozin (INVOKANA) 300 MG TABS tablet  Essential hypertension - Plan: CBC, nebivolol (BYSTOLIC) 10 MG tablet  Mixed hyperlipidemia  RUQ pain - Plan: US Abdomen Limited RUQ (LIVER/GB), DG Ribs Unilateral W/Chest Right  Insomnia, unspecified type - Plan: zolpidem (AMBIEN) 5 MG tablet  Here today for follow-up visit.  Maintenance labs for diabetes pending as above Blood pressure under reasonable control Discussed right upper quadrant tenderness.  Patient would like to pursue further evaluation; we plan for right rib films and a right upper quadrant ultrasound in addition to labs as above Refilled Ambien which he uses mostly when traveling for various medical missions  This visit occurred during the SARS-CoV-2 public health emergency.  Safety protocols were in place, including screening questions prior to the visit, additional usage of staff PPE, and extensive cleaning of exam room while observing appropriate contact time as indicated for disinfecting solutions.   Signed Lamar Blinks, MD   Addendum 9/22, message to patient  Results for orders placed or performed in visit on 01/20/21  Hemoglobin A1c  Result Value Ref Range   Hgb A1c MFr Bld 7.7 (H) 4.6 - 6.5 %  Comprehensive metabolic panel  Result Value Ref Range   Sodium 139 135 - 145 mEq/L   Potassium 4.2 3.5 - 5.1 mEq/L   Chloride 104 96 - 112 mEq/L   CO2 25 19 - 32 mEq/L    Glucose, Bld 117 (H) 70 - 99 mg/dL  BUN 25 (H) 6 - 23 mg/dL   Creatinine, Ser 0.99 0.40 - 1.50 mg/dL   Total Bilirubin 0.5 0.2 - 1.2 mg/dL   Alkaline Phosphatase 62 39 - 117 U/L   AST 16 0 - 37 U/L   ALT 22 0 - 53 U/L   Total Protein 6.9 6.0 - 8.3 g/dL   Albumin 4.3 3.5 - 5.2 g/dL   GFR 76.37 >60.00 mL/min   Calcium 9.8 8.4 - 10.5 mg/dL  CBC  Result Value Ref Range   WBC 7.6 4.0 - 10.5 K/uL   RBC 4.34 4.22 - 5.81 Mil/uL   Platelets 287.0 150.0 - 400.0 K/uL   Hemoglobin 13.6 13.0 - 17.0 g/dL   HCT 40.5 39.0 - 52.0 %   MCV 93.4 78.0 - 100.0 fl   MCHC 33.6 30.0 - 36.0 g/dL   RDW 12.9 11.5 - 15.5 %

## 2021-01-20 ENCOUNTER — Other Ambulatory Visit: Payer: Self-pay

## 2021-01-20 ENCOUNTER — Encounter: Payer: Self-pay | Admitting: Family Medicine

## 2021-01-20 ENCOUNTER — Ambulatory Visit (INDEPENDENT_AMBULATORY_CARE_PROVIDER_SITE_OTHER): Payer: Medicare Other | Admitting: Family Medicine

## 2021-01-20 ENCOUNTER — Ambulatory Visit (HOSPITAL_BASED_OUTPATIENT_CLINIC_OR_DEPARTMENT_OTHER)
Admission: RE | Admit: 2021-01-20 | Discharge: 2021-01-20 | Disposition: A | Discharge status: Home / Self Care | Payer: Medicare Other | Source: Ambulatory Visit | Attending: Family Medicine | Admitting: Family Medicine

## 2021-01-20 VITALS — BP 142/85 | HR 71 | Temp 97.9°F | Resp 18 | Ht 72.0 in | Wt 230.4 lb

## 2021-01-20 DIAGNOSIS — E119 Type 2 diabetes mellitus without complications: Secondary | ICD-10-CM | POA: Diagnosis not present

## 2021-01-20 DIAGNOSIS — M419 Scoliosis, unspecified: Secondary | ICD-10-CM | POA: Diagnosis not present

## 2021-01-20 DIAGNOSIS — E782 Mixed hyperlipidemia: Secondary | ICD-10-CM | POA: Diagnosis not present

## 2021-01-20 DIAGNOSIS — I10 Essential (primary) hypertension: Secondary | ICD-10-CM

## 2021-01-20 DIAGNOSIS — I700 Atherosclerosis of aorta: Secondary | ICD-10-CM | POA: Diagnosis not present

## 2021-01-20 DIAGNOSIS — R1011 Right upper quadrant pain: Secondary | ICD-10-CM | POA: Insufficient documentation

## 2021-01-20 DIAGNOSIS — G4700 Insomnia, unspecified: Secondary | ICD-10-CM

## 2021-01-20 DIAGNOSIS — J8410 Pulmonary fibrosis, unspecified: Secondary | ICD-10-CM | POA: Diagnosis not present

## 2021-01-20 MED ORDER — ZOLPIDEM TARTRATE 5 MG PO TABS: ORAL_TABLET | Freq: Every evening | ORAL | 2 refills | Status: DC | PRN

## 2021-01-20 MED ORDER — EXENATIDE ER 2 MG/0.85ML ~~LOC~~ AUIJ: AUTO-INJECTOR | SUBCUTANEOUS | 3 refills | Status: DC

## 2021-01-20 MED ORDER — CANAGLIFLOZIN 300 MG PO TABS: ORAL_TABLET | Freq: Every day | ORAL | 3 refills | Status: DC

## 2021-01-20 MED ORDER — NEBIVOLOL HCL 10 MG PO TABS: ORAL_TABLET | Freq: Every evening | ORAL | 3 refills | Status: DC

## 2021-01-20 NOTE — Patient Instructions (Addendum)
We will get you set up for a RUQ [place] and right sided rib films asap I will be in touch with your labs asap  Assuming all is well please see me in about 4 months

## 2021-01-21 ENCOUNTER — Encounter: Payer: Self-pay | Admitting: Family Medicine

## 2021-01-21 ENCOUNTER — Ambulatory Visit (HOSPITAL_BASED_OUTPATIENT_CLINIC_OR_DEPARTMENT_OTHER)
Admission: RE | Admit: 2021-01-21 | Discharge: 2021-01-21 | Disposition: A | Discharge status: Home / Self Care | Payer: Medicare Other | Source: Ambulatory Visit | Attending: Family Medicine | Admitting: Family Medicine

## 2021-01-21 DIAGNOSIS — R1011 Right upper quadrant pain: Secondary | ICD-10-CM | POA: Insufficient documentation

## 2021-01-21 DIAGNOSIS — K7689 Other specified diseases of liver: Secondary | ICD-10-CM | POA: Diagnosis not present

## 2021-01-21 LAB — COMPREHENSIVE METABOLIC PANEL
ALT: 22 U/L (ref 0–53)
AST: 16 U/L (ref 0–37)
Albumin: 4.3 g/dL (ref 3.5–5.2)
Alkaline Phosphatase: 62 U/L (ref 39–117)
BUN: 25 mg/dL — ABNORMAL HIGH (ref 6–23)
CO2: 25 mEq/L (ref 19–32)
Calcium: 9.8 mg/dL (ref 8.4–10.5)
Chloride: 104 mEq/L (ref 96–112)
Creatinine, Ser: 0.99 mg/dL (ref 0.40–1.50)
GFR: 76.37 mL/min (ref >60.00)
Glucose, Bld: 117 mg/dL — ABNORMAL HIGH (ref 70–99)
Potassium: 4.2 mEq/L (ref 3.5–5.1)
Sodium: 139 mEq/L (ref 135–145)
Total Bilirubin: 0.5 mg/dL (ref 0.2–1.2)
Total Protein: 6.9 g/dL (ref 6.0–8.3)

## 2021-01-21 LAB — CBC
HCT: 40.5 % (ref 39.0–52.0)
Hemoglobin: 13.6 g/dL (ref 13.0–17.0)
MCHC: 33.6 g/dL (ref 30.0–36.0)
MCV: 93.4 fl (ref 78.0–100.0)
Platelets: 287 10*3/uL (ref 150.0–400.0)
RBC: 4.34 Mil/uL (ref 4.22–5.81)
RDW: 12.9 % (ref 11.5–15.5)
WBC: 7.6 10*3/uL (ref 4.0–10.5)

## 2021-01-21 LAB — HEMOGLOBIN A1C: Hgb A1c MFr Bld: 7.7 % — ABNORMAL HIGH (ref 4.6–6.5)

## 2021-01-22 ENCOUNTER — Encounter: Payer: Self-pay | Admitting: Family Medicine

## 2021-02-02 ENCOUNTER — Encounter: Payer: Self-pay | Admitting: Neurology

## 2021-02-04 ENCOUNTER — Ambulatory Visit: Payer: Medicare Other | Admitting: Neurology

## 2021-02-04 ENCOUNTER — Encounter: Payer: Self-pay | Admitting: Neurology

## 2021-02-04 VITALS — BP 142/81 | HR 68 | Ht 72.0 in | Wt 235.0 lb

## 2021-02-04 DIAGNOSIS — G4733 Obstructive sleep apnea (adult) (pediatric): Secondary | ICD-10-CM

## 2021-02-04 DIAGNOSIS — E1140 Type 2 diabetes mellitus with diabetic neuropathy, unspecified: Secondary | ICD-10-CM

## 2021-02-04 DIAGNOSIS — I2510 Atherosclerotic heart disease of native coronary artery without angina pectoris: Secondary | ICD-10-CM | POA: Diagnosis not present

## 2021-02-04 DIAGNOSIS — Z9989 Dependence on other enabling machines and devices: Secondary | ICD-10-CM | POA: Diagnosis not present

## 2021-02-04 DIAGNOSIS — J449 Chronic obstructive pulmonary disease, unspecified: Secondary | ICD-10-CM

## 2021-02-04 NOTE — Progress Notes (Signed)
SLEEP MEDICINE CLINIC   Provider:  Larey Seat, M D  Referring Provider: Darreld Mclean, MD   Primary Care Physician:  Darreld Mclean, MD  Chief Complaint  Patient presents with   Obstructive Sleep Apnea    Rm 10, alone. Here for initial CPAP f/u w new CPAP machine. Pt reports doing well. Last night was not a good night, pt kept getting twisted w it.    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  Severe AHi treated well on CPAP, AHI 3.3/h. and using autotitration 8-14 cm water, 3 cm EPR.  I like to increase the upper range of pressure. We agreed to set from 10-16 cm water., keep 3 cm EPR.  I like to use his DME to eliminate the ramp time.  DM- HTN- obesity.        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  He has a history of type 2 diabetes with neuropathy, and he has a history of a stroke and that the year 2016.  His CPAP treatment started 5 years ago and he is due for new machine as of January 11.  Also he has been on CPAP for over 15 years prior to our sleep involvement.  So at this time before he will fully retire he would like to have a replacement machine and he is due for 1, I will order a home sleep test just to have the current level of apnea documented I will send an order for auto titration CPAP parallel which means that the machine can adjust itself between the pressure setting that I will be determining.  I would like to make sure that he has supplies until the new machine arrives.  He uses a ResMed nasal pillows.  He does have facial hair so this is actually a better seal.  He has used the machine for the last 30 days 100% and every night over 4 hours which makes him 100% compliant user with an average user time of 7 hours 23 minutes.  The set pressure at this time is 11 cmH2O was 2 cm expiratory pressure relief setting heated humidification and a residual AHI of 3.7.  No central  apneas are emerging he does have some unknown apneas which usually means that there is some air leakage that he nurse the machine to recognize if central or obstructive.  The 95th percentile air leak is quite high at 44 L.  However his apnea index seems to be keeping low.      He is travelling to [place] for a mission trip , later this week. HST ASAP ? I spoke to him about [employer], the BMI requirement, the  Patient group that is most likely to benefit. 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 Lorelei Pont for OSA , transfer of sleep care.   [name] has been an extremely compliant CPAP user and reached a compliance of 100% with an average use at time of 7 hours and 8 minutes, he is using an air sense 10 AutoSet with a serial #[id number].  Set pressure is 11 cmH2O was 2 cm EPR and his residual AHI 3.3 most of the residuals are  obstructive apneas in origin there is no Cheyne-Stokes respiration noted.  The Epworth sleepiness score was endorsed at 4 out of 24 points, the fatigue severity was noted at 16 out of 63. There are some days with weather high air leaks but they did not seem not to influence the apnea count negatively. He has an over 18 year old machine and this should be replaced.  He will come to have either a PSG or HST in order to reestablished a baseline.  She needs a new machine.     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 Lorelei Pont for OSA , transfer of sleep care. He is now seen I  A virtual visit for CPAP compliance. He has used a by now 72 year old machine that can be repleced as soon as he agrees to it, or needs it.   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  His average user time is 7 hours 10 minutes, set pressure for the air sense 10 AutoSet is 11 cmH2O pressure was 2 cm EPR, he does have air leakage the 95th percentile is 37 L/min but his events per hour are just 2.5 AHI.  Very few central apneas are emerging the residual apnea index is mostly consistent of hypopnea and obstructive sleep apnea.  The patient reports that he has noted some air leakage but usually he wakes up with a :-) on his CPAP display.  He sleeps well he reads in bed and is asleep very promptly once he puts the CPAP on he may have one nocturia rarely to and after each bathroom break is awake for 10 to 15 minutes.  He rises refreshed and restored but he feels physically stiff which may have other reasons and apnea.     The patient suffered an occipital stroke in 2014. He has a long history of migraines. He had a last sleep study in [place], by Dr. Scarlette Calico. 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 This remains worrisome for him as he should not take Triptan- medication since his presumed visual aura persisted for about 14 hours he was seen by one of his colleagues , Dr. Arlyss Queen, who initiated a stroke workup. 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 He is still taking Onglyza,. He is seen today because he had not had a repeat sleep study for the last 12 years. He has been on the same setting and on the same type CPAP machine. He is interested in reevaluating his sleep apnea and treatment options.He recalls that about 8 years ago he was on a medical mission and used his original CPAP machine on a car battery and it broke. He replaced the  the same type however at the same settings.   Interval history from 08/12/2015 [name] is here after  his recent sleep study performed on 05/13/2015, during which he was diagnosed again with sleep apnea at an AHI of 49.8 no REM sleep was noted during the diagnostic part, supine AHI was 54.8 and nonsupine 27.3 average end tidal CO2 was 30.64 torr, no prolonged oxygen desaturations  were noted. Occasional premature ventricular beats were seen, the patient was titrated to CPAP and was doing well on 11 cm water pressure. A 2 cm flex function was requested. Today we also having  the opportunity to see the first download residual AHI is 2.6, at 11 cm water pressure with an average user time of 6 hours and 51 minutes and 100% compliance for days of use and over 4 hours of daily use. [name] would like an auto set function that can be used as he loses weight.  Sleep medical history and family sleep history: Brother on CPAP, his "sister should be".  Social history: married, no tobacco use ever, no ETOH , caffeine user - plain iced tea , 3-4 glasses a day.  Interval history from 08/11/2016, as the pleasure of meeting with [name] today for a yearly compliance visit the patient has again reached 97% compliance with an average user time of 6 hours and 57 minutes the pressure set at 11 cm water with 2 cm EPR the residual AHI is 3.0 there are some air leaks but no major ones, there is no evidence of treatment emergent central apneas. He likes his machine and he feels that he is benefiting his sleep quality.  He has a new grand child.   Interval 08-30-2017, at the pleasure of meeting Ms. Dr. Shanon Brow [facility] today for his regularly CPAP compliance.  The patient recently went through a mission in [place] and hemostasis was able to use his CPAP.  However he has noted that when electricity was not provided or available to run his machine he was also not recharging his batteries.  He is able to sleep much better on the CPAP and would like to be able to use a CPAP on prolonged flights areas.  We will discuss a travel CPAP may  be a refurbished travel CPAP that would be cheaper to purchase. His compliance download is as usual, excellent.  His machine is set at 11 cmH2O was 2 cm EPR, his residual AHI was 3.3 there are some moderate air leaks, use of time on average was 6 hours and 46 minutes 100% of days and 29 of these days over 4 hours consecutive views were documented establishing a compliance of 97%.  Review of Systems: Out of a complete 14 system review, the patient complains of only the following symptoms, and all other reviewed systems are negative.  The fatigue severity score was today endorsed at 13points and the Epworth sleepiness score at 7 points. 0 points were endorsed on the 15 point geriatric depression score.  Total knee replacement 4 years ago. Arthroscopic left knee 15 years ago. Knee pain while gardening, golf not affected.  Neck pain, especially on long flights.   How likely are you to doze in the following situations: 0 = not likely, 1 = slight chance, 2 = moderate chance, 3 = high chance  Sitting and Reading? Watching Television? Sitting inactive in a public place (theater or meeting)? Lying down in the afternoon when circumstances permit? Sitting and talking to someone? Sitting quietly after lunch without alcohol? In a car, while stopped for a few minutes in traffic? As a passenger in a car for an hour without a break?  Total =5 on CPAP.   100 % compliance.       Social History   Socioeconomic History   Marital status: Married    Spouse name: Learta Codding   Number of children: 4   Years of education: 65  Highest education level: Not on file  Occupational History   Occupation: MD    Employer: [employer]  Tobacco Use   Smoking status: Never   Smokeless tobacco: Never  Substance and Sexual Activity   Alcohol use: No    Alcohol/week: 0.0 standard drinks   Drug use: No   Sexual activity: Not on file  Other Topics Concern   Not on file  Social History Narrative   Patient lives at  home with family.   Caffeine Use: occasionally tea   Social Determinants of Health   Financial Resource Strain: Not on file  Food Insecurity: Not on file  Transportation Needs: Not on file  Physical Activity: Not on file  Stress: Not on file  Social Connections: Not on file  Intimate Partner Violence: Not on file    Family History  Problem Relation Age of Onset   Cancer Mother        Breast   Stroke Mother    Stroke Maternal Grandmother    Stroke Maternal Aunt    Stroke Maternal Uncle     Past Medical History:  Diagnosis Date   Anemia    Arthritis    Diabetes mellitus    Gout    Hyperlipidemia    Hypertension    Localized osteoarthritis of left knee 11/20/2012   Migraine headache with aura    Neuromuscular disorder ([place])    " mild periferal neauropathy   Osteoarthritis of left hip 11/20/2012   PVC (premature ventricular contraction)    Sleep apnea    cpap    Past Surgical History:  Procedure Laterality Date   CHOLECYSTECTOMY  1997   CVA     CVA, occiptal   CVA     CVA, occipital   knee arthoscopy  2006   Left Knee   LEFT HEART CATH AND CORONARY ANGIOGRAPHY N/A 12/07/2017   Procedure: LEFT HEART CATH AND CORONARY ANGIOGRAPHY;  Surgeon: Troy Sine, MD;  Location: [facility];  Service: Cardiovascular;  Laterality: N/A;   left total hip     left unicarpmental knee     LOOP RECORDER IMPLANT N/A 04/19/2013   Procedure: LOOP RECORDER IMPLANT;  Surgeon: Evans Lance, MD;  Location: [facility];  Service: Cardiovascular;  Laterality: N/A;   PARTIAL KNEE ARTHROPLASTY Left 11/20/2012   Procedure: UNICOMPARTMENTAL KNEE;  Surgeon: Johnny Bridge, MD;  Location: [facility];  Service: Orthopedics;  Laterality: Left;   REPLACEMENT UNICONDYLAR JOINT KNEE Left 11/20/2012   Dr Mardelle Matte   TEE WITHOUT CARDIOVERSION N/A 04/19/2013   Procedure: TRANSESOPHAGEAL ECHOCARDIOGRAM (TEE);  Surgeon: Larey Dresser, MD;  Location: [facility];  Service: Cardiovascular;   Laterality: N/A;   TONSILLECTOMY AND ADENOIDECTOMY     Childhood   TOTAL HIP ARTHROPLASTY Left 11/20/2012   Dr Mardelle Matte   TOTAL HIP ARTHROPLASTY Left 11/20/2012   Procedure: TOTAL HIP ARTHROPLASTY;  Surgeon: Johnny Bridge, MD;  Location: [facility];  Service: Orthopedics;  Laterality: Left;    Current Outpatient Medications  Medication Sig Dispense Refill   acetaminophen (TYLENOL) 650 MG CR tablet Take 1,300 mg by mouth every 8 (eight) hours as needed for pain.     Alcohol Swabs PADS Use to test up to 4 times daily 100 each 2   allopurinol (ZYLOPRIM) 300 MG tablet TAKE 1 TABLET BY MOUTH DAILY. 90 tablet 3   blood glucose meter kit and supplies KIT Use up to four times daily as directed. Dx E13.40. 1 each 0  Blood Glucose Monitoring Suppl (FREESTYLE LITE) DEVI Use to test up to 4 times daily 1 each 0   canagliflozin (INVOKANA) 300 MG TABS tablet TAKE 1 TABLET BY MOUTH ONCE DAILY 90 tablet 3   clopidogrel (PLAVIX) 75 MG tablet TAKE 1 TABLET (75 MG TOTAL) BY MOUTH DAILY WITH BREAKFAST. 90 tablet 3   diclofenac sodium (VOLTAREN) 1 % GEL Apply 1 application topically 2 (two) times daily as needed (for pain).     doxycycline (VIBRAMYCIN) 100 MG capsule TAKE 1 CAPSULE BY MOUTH DAILY. USE FOR MALARIA PROPHYLAXIS 60 capsule 0   Exenatide ER 2 MG/0.85ML AUIJ INJECT 2 MG INTO THE SKIN ONCE A WEEK. 10.2 mL 3   glucose blood (FREESTYLE LITE) test strip USE TO TEST UP TO 4 TIMES DAILY 100 each 12   hydrocortisone-pramoxine (ANALPRAM HC) 2.5-1 % rectal cream Place 1 application rectally 3 (three) times daily. 30 g 0   insulin glargine (LANTUS SOLOSTAR) 100 UNIT/ML Solostar Pen Inject 30 Units into the skin daily. 15 mL PRN   Insulin Pen Needle (PEN NEEDLES) 32G X 5 MM MISC 1 each by Does not apply route daily. Use for basal insulin injection 100 each 6   losartan (COZAAR) 50 MG tablet TAKE 2 TABLETS (100 MG) BY MOUTH ONCE A DAY 600 tablet 0   metaxalone (SKELAXIN) 800 MG tablet Take 1 tablet (800 mg total) by  mouth every 6 (six) hours as needed. 60 tablet 1   metFORMIN (GLUCOPHAGE-XR) 500 MG 24 hr tablet Take 2 tablets (1,000 mg total) by mouth 2 (two) times daily. 360 tablet 3   nebivolol (BYSTOLIC) 10 MG tablet TAKE 1 TABLET BY MOUTH EVERY EVENING. 90 tablet 3   SM LANCETS 33G MISC USE TO TEST BLOOD SUGAR TWICE DAILY 400 each PRN   tamsulosin (FLOMAX) 0.4 MG CAPS capsule TAKE 1 CAPSULE BY MOUTH ONCE DAILY 90 capsule 3   zolpidem (AMBIEN) 5 MG tablet TAKE 1 TABLET BY MOUTH AT BEDTIME AS NEEDED FOR SLEEP 30 tablet 2   rosuvastatin (CRESTOR) 5 MG tablet TAKE 1 TABLET (5 MG TOTAL) BY MOUTH EVERY MONDAY, WEDNESDAY, AND FRIDAY. 39 tablet 4   No current facility-administered medications for this visit.    Allergies as of 02/04/2021 - Review Complete 01/20/2021  Allergen Reaction Noted   Sulfa antibiotics Other (See Comments) 06/01/2011    Vitals: BP (!) 142/81   Pulse 68   Ht 6' (1.829 m)   Wt 235 lb (106.6 kg)   BMI 31.87 kg/m  Last Weight:  Wt Readings from Last 1 Encounters:  02/04/21 235 lb (106.6 kg)   JSE:GBTD mass index is 31.87 kg/m.     Last Height:   Ht Readings from Last 1 Encounters:  02/04/21 6' (1.829 m)   Well groomed- mo ankle edema, General: The patient is awake, alert and appears not in acute distress. The patient is well groomed. Head: Normocephalic, atraumatic. Neck is supple.  Mallampati 1, the airway is short and ends abruptly is not narrow.  The palate is of normal shape the uvula is midline the tongue is midline without fasciculation or tremor. neck circumference: 17. 5"  Nasal airflow unrestricted , Mild Retrognathia is seen.  Neurologic exam : The patient is awake and alert, oriented to place and time.   Attention span & concentration ability appears normal.  Speech is fluent,  without dysarthria, dysphonia or aphasia. He is speaking as if short of breath.   Mood and affect are appropriate. Cranial nerves: no  loss of smell or taste.  Pupils are equal  -Facial motor strength is symmetric and tongue and uvula move midline. Shoulder shrug was symmetrical.  Sensory: intact touch and vibration.  Deep tendon reflexes: in the  upper and lower extremities are symmetric and intact. Babinski up-going, and painful! Hypersensitive planta pedis.   A/P: Patient reports he had one night being miserable because the CPAP could not be used that night- he had left the tubing in a hotel room in [place], he felt an immediate decrement in sleep quality.   He uses a ResMed nasal pillows.  He does have facial hair so this is actually a better seal.  He has used the machine for the last 30 days 100% and every night over 4 hours which makes him 100% compliant user with an average user time of 7 hours 23 minutes.  The set pressure at this time is 11 cmH2O was 2 cm expiratory pressure relief setting heated humidification and a residual AHI of 3.7.  No central apneas are emerging he does have some unknown apneas which usually means that there is some air leakage that he nurse the machine to recognize if central or obstructive.  The 95th percentile air leak is quite high at 44 L.  However his apnea index seems to be keeping low.     1) Assesment ; OSA on CPAP, highly compliant user, 100%, needs a new machine and new baseline. He is travelling to [place] for a mission trip , later this week. HST ASAP ? I spoke to him about [employer], the BMI requirement, the  Patient group that is most likely to benefit. 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   Severe AHi treated well on CPAP, AHI 3.3/h. and using autotitration 8-14 cm water, 3 cm EPR.  I like to increase the upper range of pressure. We agreed to set from 10-16 cm water., keep 3 cm EPR.  I like to use his DME to eliminate the ramp time.  DM- HTN- obesity.    2) Diabetic neuropathy with hyperpathia in both feet, preserved vibration and fine touch. He is not interested in any  gabapentin.     Follow Up Instructions: He has a new CPAP autotitration device, replaced an over 10 year old CPAP machine.Severe AHi treated well on CPAP, AHI 3.3/h. and using autotitration 8-14 cm water, 3 cm EPR.  I like to increase the upper range of pressure. We agreed to set from 10-16 cm water., keep 3 cm EPR.  I like to use his DME to eliminate the ramp time.  DM- HTN- obesity.  I discussed the assessment and treatment plan with the patient. The patient was provided an opportunity to ask questions and all were answered. The patient agreed with the plan and demonstrated an understanding of the instructions.   The patient was advised to call back or seek an in-person evaluation if the symptoms worsen or if the condition fails to improve as anticipated.  I provided 22 minutes of -face-to-face time during this encounter.   Larey Seat, MD  02/04/2021   CC: Darreld Mclean, Md [address],  [place] [id number]   CC: Dr Carola Frost- [place] service.

## 2021-02-04 NOTE — Progress Notes (Signed)
CM sent to [facility]

## 2021-02-10 ENCOUNTER — Ambulatory Visit: Payer: 59 | Admitting: Neurology

## 2021-02-12 ENCOUNTER — Emergency Department (HOSPITAL_COMMUNITY)
Admission: EM | Admit: 2021-02-12 | Discharge: 2021-02-12 | Disposition: A | Discharge status: Home / Self Care | Payer: Medicare Other | Attending: Emergency Medicine | Admitting: Emergency Medicine

## 2021-02-12 ENCOUNTER — Emergency Department (HOSPITAL_COMMUNITY): Payer: Medicare Other

## 2021-02-12 ENCOUNTER — Encounter (HOSPITAL_COMMUNITY): Payer: Self-pay

## 2021-02-12 DIAGNOSIS — Z794 Long term (current) use of insulin: Secondary | ICD-10-CM | POA: Insufficient documentation

## 2021-02-12 DIAGNOSIS — S51012A Laceration without foreign body of left elbow, initial encounter: Secondary | ICD-10-CM | POA: Diagnosis not present

## 2021-02-12 DIAGNOSIS — S52572A Other intraarticular fracture of lower end of left radius, initial encounter for closed fracture: Secondary | ICD-10-CM | POA: Insufficient documentation

## 2021-02-12 DIAGNOSIS — Z79899 Other long term (current) drug therapy: Secondary | ICD-10-CM | POA: Diagnosis not present

## 2021-02-12 DIAGNOSIS — R079 Chest pain, unspecified: Secondary | ICD-10-CM | POA: Diagnosis not present

## 2021-02-12 DIAGNOSIS — I10 Essential (primary) hypertension: Secondary | ICD-10-CM | POA: Diagnosis not present

## 2021-02-12 DIAGNOSIS — S52515A Nondisplaced fracture of left radial styloid process, initial encounter for closed fracture: Secondary | ICD-10-CM | POA: Diagnosis not present

## 2021-02-12 DIAGNOSIS — Z7902 Long term (current) use of antithrombotics/antiplatelets: Secondary | ICD-10-CM | POA: Diagnosis not present

## 2021-02-12 DIAGNOSIS — Z7984 Long term (current) use of oral hypoglycemic drugs: Secondary | ICD-10-CM | POA: Insufficient documentation

## 2021-02-12 DIAGNOSIS — E1140 Type 2 diabetes mellitus with diabetic neuropathy, unspecified: Secondary | ICD-10-CM | POA: Insufficient documentation

## 2021-02-12 DIAGNOSIS — Y92410 Unspecified street and highway as the place of occurrence of the external cause: Secondary | ICD-10-CM | POA: Insufficient documentation

## 2021-02-12 DIAGNOSIS — I2510 Atherosclerotic heart disease of native coronary artery without angina pectoris: Secondary | ICD-10-CM | POA: Insufficient documentation

## 2021-02-12 DIAGNOSIS — R0781 Pleurodynia: Secondary | ICD-10-CM | POA: Diagnosis not present

## 2021-02-12 DIAGNOSIS — R58 Hemorrhage, not elsewhere classified: Secondary | ICD-10-CM | POA: Diagnosis not present

## 2021-02-12 DIAGNOSIS — M25511 Pain in right shoulder: Secondary | ICD-10-CM | POA: Diagnosis not present

## 2021-02-12 DIAGNOSIS — Z96642 Presence of left artificial hip joint: Secondary | ICD-10-CM | POA: Insufficient documentation

## 2021-02-12 DIAGNOSIS — S52502A Unspecified fracture of the lower end of left radius, initial encounter for closed fracture: Secondary | ICD-10-CM | POA: Diagnosis not present

## 2021-02-12 DIAGNOSIS — M25532 Pain in left wrist: Secondary | ICD-10-CM | POA: Diagnosis not present

## 2021-02-12 DIAGNOSIS — M7989 Other specified soft tissue disorders: Secondary | ICD-10-CM | POA: Diagnosis not present

## 2021-02-12 DIAGNOSIS — R0782 Intercostal pain: Secondary | ICD-10-CM | POA: Diagnosis not present

## 2021-02-12 DIAGNOSIS — R609 Edema, unspecified: Secondary | ICD-10-CM | POA: Diagnosis not present

## 2021-02-12 DIAGNOSIS — S52572D Other intraarticular fracture of lower end of left radius, subsequent encounter for closed fracture with routine healing: Secondary | ICD-10-CM | POA: Diagnosis not present

## 2021-02-12 DIAGNOSIS — S6992XA Unspecified injury of left wrist, hand and finger(s), initial encounter: Secondary | ICD-10-CM | POA: Diagnosis present

## 2021-02-12 MED ORDER — ACETAMINOPHEN 325 MG PO TABS
650.0000 mg | ORAL_TABLET | Freq: Once | ORAL | Status: AC
  Administered 2021-02-12: 650 mg via ORAL
  Filled 2021-02-12: qty 2

## 2021-02-12 MED ORDER — BACITRACIN ZINC 500 UNIT/GM EX OINT
1.0000 "application " | TOPICAL_OINTMENT | Freq: Once | CUTANEOUS | Status: AC
  Administered 2021-02-12: 1 via TOPICAL
  Filled 2021-02-12: qty 1.8

## 2021-02-12 MED ORDER — HYDROCODONE-ACETAMINOPHEN 5-325 MG PO TABS: 1.0000 | ORAL_TABLET | Freq: Four times a day (QID) | ORAL | 0 refills | Status: DC | PRN

## 2021-02-12 NOTE — ED Notes (Signed)
Ortho tech at bedside 
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
## 2021-02-12 NOTE — ED Notes (Signed)
Ambulated to the bathroom and back to his room independently on a steady gait. Pt is alert and oriented x 4.

## 2021-02-12 NOTE — Progress Notes (Signed)
Orthopedic Tech Progress Note Patient Details:  BRAVEN WOLK [birth year] [id number]  Ortho Devices Type of Ortho Device: Sugartong splint Ortho Device/Splint Location: lue Ortho Device/Splint Interventions: Ordered, Application, Adjustment   Post Interventions Patient Tolerated: Well Instructions Provided: Care of device, Poper ambulation with device  Skippy Marhefka L Briony Parveen 02/12/2021, 1:56 PM

## 2021-02-12 NOTE — Discharge Instructions (Signed)
Contact Dr. Luanna Cole office to schedule a follow-up appointment.  Take the medications as needed for pain.  Ice to help with the swelling.  Elevate your arm when able.  Return to the ER for worsening symptoms, shortness of breath, abdominal pain

## 2021-02-12 NOTE — ED Notes (Signed)
Ortho tech notified and is coming to place splint to right upper extremity.

## 2021-02-12 NOTE — ED Notes (Addendum)
Wounds to left hand and left elbow irrigated and cleaned. Bacitracin applied. Non adherent dressing applied. Pt tolerated well. Will continue to monitor.
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
## 2021-02-15 ENCOUNTER — Ambulatory Visit: Payer: 59 | Admitting: Neurology

## 2021-02-15 DIAGNOSIS — S52502A Unspecified fracture of the lower end of left radius, initial encounter for closed fracture: Secondary | ICD-10-CM | POA: Diagnosis not present

## 2021-02-16 DIAGNOSIS — G4733 Obstructive sleep apnea (adult) (pediatric): Secondary | ICD-10-CM | POA: Diagnosis not present

## 2021-02-24 DIAGNOSIS — S52502D Unspecified fracture of the lower end of left radius, subsequent encounter for closed fracture with routine healing: Secondary | ICD-10-CM | POA: Diagnosis not present

## 2021-03-10 ENCOUNTER — Encounter: Payer: Self-pay | Admitting: Family Medicine

## 2021-03-10 DIAGNOSIS — E119 Type 2 diabetes mellitus without complications: Secondary | ICD-10-CM

## 2021-03-10 MED ORDER — FREESTYLE LITE TEST VI STRP: ORAL_STRIP | 12 refills | Status: DC

## 2021-03-13 MED ORDER — BLOOD GLUCOSE MONITOR KIT: PACK | 0 refills | Status: AC

## 2021-03-13 NOTE — Addendum Note (Signed)
Addended by: Lamar Blinks C on: 03/13/2021 06:51 AM   Modules accepted: Orders

## 2021-03-17 ENCOUNTER — Other Ambulatory Visit: Payer: Self-pay

## 2021-03-17 DIAGNOSIS — Z8673 Personal history of transient ischemic attack (TIA), and cerebral infarction without residual deficits: Secondary | ICD-10-CM

## 2021-03-17 MED ORDER — CLOPIDOGREL BISULFATE 75 MG PO TABS: 75.0000 mg | ORAL_TABLET | Freq: Every day | ORAL | 0 refills | Status: DC

## 2021-03-19 DIAGNOSIS — G4733 Obstructive sleep apnea (adult) (pediatric): Secondary | ICD-10-CM | POA: Diagnosis not present

## 2021-03-21 ENCOUNTER — Other Ambulatory Visit: Payer: Self-pay | Admitting: Family Medicine

## 2021-03-21 DIAGNOSIS — Z8673 Personal history of transient ischemic attack (TIA), and cerebral infarction without residual deficits: Secondary | ICD-10-CM

## 2021-03-22 DIAGNOSIS — S52502D Unspecified fracture of the lower end of left radius, subsequent encounter for closed fracture with routine healing: Secondary | ICD-10-CM | POA: Diagnosis not present

## 2021-04-02 ENCOUNTER — Other Ambulatory Visit: Payer: Self-pay

## 2021-04-02 DIAGNOSIS — M1000 Idiopathic gout, unspecified site: Secondary | ICD-10-CM

## 2021-04-02 MED ORDER — ROSUVASTATIN CALCIUM 5 MG PO TABS: ORAL_TABLET | ORAL | 2 refills | Status: DC

## 2021-04-02 MED ORDER — ALLOPURINOL 300 MG PO TABS: ORAL_TABLET | Freq: Every day | ORAL | 1 refills | Status: DC

## 2021-04-28 DIAGNOSIS — S52502D Unspecified fracture of the lower end of left radius, subsequent encounter for closed fracture with routine healing: Secondary | ICD-10-CM | POA: Diagnosis not present

## 2021-05-07 ENCOUNTER — Other Ambulatory Visit: Payer: Self-pay | Admitting: Family Medicine

## 2021-05-07 MED ORDER — LOSARTAN POTASSIUM 50 MG PO TABS: ORAL_TABLET | ORAL | 1 refills | Status: DC

## 2021-05-11 DIAGNOSIS — E119 Type 2 diabetes mellitus without complications: Secondary | ICD-10-CM | POA: Diagnosis not present

## 2021-05-11 LAB — HM DIABETES EYE EXAM

## 2021-05-16 ENCOUNTER — Encounter: Payer: Self-pay | Admitting: Family Medicine

## 2021-05-16 DIAGNOSIS — E782 Mixed hyperlipidemia: Secondary | ICD-10-CM

## 2021-05-16 DIAGNOSIS — E119 Type 2 diabetes mellitus without complications: Secondary | ICD-10-CM

## 2021-05-16 DIAGNOSIS — I10 Essential (primary) hypertension: Secondary | ICD-10-CM

## 2021-05-16 DIAGNOSIS — Z125 Encounter for screening for malignant neoplasm of prostate: Secondary | ICD-10-CM
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
## 2021-05-16 NOTE — Addendum Note (Signed)
Addended by: Lamar Blinks C on: 05/16/2021 05:34 PM   Modules accepted: Orders

## 2021-05-19 ENCOUNTER — Ambulatory Visit (INDEPENDENT_AMBULATORY_CARE_PROVIDER_SITE_OTHER): Payer: Medicare Other | Admitting: Family Medicine

## 2021-05-19 ENCOUNTER — Other Ambulatory Visit: Payer: Medicare Other

## 2021-05-19 VITALS — BP 132/70 | HR 64 | Temp 97.8°F | Resp 18 | Ht 72.0 in | Wt 235.4 lb

## 2021-05-19 DIAGNOSIS — K047 Periapical abscess without sinus: Secondary | ICD-10-CM

## 2021-05-19 DIAGNOSIS — Z8673 Personal history of transient ischemic attack (TIA), and cerebral infarction without residual deficits: Secondary | ICD-10-CM

## 2021-05-19 DIAGNOSIS — E782 Mixed hyperlipidemia: Secondary | ICD-10-CM | POA: Diagnosis not present

## 2021-05-19 DIAGNOSIS — I10 Essential (primary) hypertension: Secondary | ICD-10-CM

## 2021-05-19 DIAGNOSIS — Z125 Encounter for screening for malignant neoplasm of prostate: Secondary | ICD-10-CM

## 2021-05-19 DIAGNOSIS — E119 Type 2 diabetes mellitus without complications: Secondary | ICD-10-CM

## 2021-05-19 DIAGNOSIS — Z298 Encounter for other specified prophylactic measures: Secondary | ICD-10-CM

## 2021-05-19 DIAGNOSIS — R972 Elevated prostate specific antigen [PSA]: Secondary | ICD-10-CM

## 2021-05-19 DIAGNOSIS — G4700 Insomnia, unspecified: Secondary | ICD-10-CM

## 2021-05-19 DIAGNOSIS — M1000 Idiopathic gout, unspecified site: Secondary | ICD-10-CM

## 2021-05-19 DIAGNOSIS — Z2989 Encounter for other specified prophylactic measures: Secondary | ICD-10-CM

## 2021-05-19 LAB — BASIC METABOLIC PANEL
BUN: 25 mg/dL — ABNORMAL HIGH (ref 6–23)
CO2: 26 mEq/L (ref 19–32)
Calcium: 9.8 mg/dL (ref 8.4–10.5)
Chloride: 104 mEq/L (ref 96–112)
Creatinine, Ser: 1.17 mg/dL (ref 0.40–1.50)
GFR: 62.35 mL/min (ref >60.00)
Glucose, Bld: 124 mg/dL — ABNORMAL HIGH (ref 70–99)
Potassium: 4.1 mEq/L (ref 3.5–5.1)
Sodium: 138 mEq/L (ref 135–145)

## 2021-05-19 LAB — HEMOGLOBIN A1C: Hgb A1c MFr Bld: 8 % — ABNORMAL HIGH (ref 4.6–6.5)

## 2021-05-19 LAB — LIPID PANEL
Cholesterol: 122 mg/dL (ref 0–200)
HDL: 45.1 mg/dL (ref >39.00)
LDL Cholesterol: 43 mg/dL (ref 0–99)
NonHDL: 76.96
Total CHOL/HDL Ratio: 3
Triglycerides: 169 mg/dL — ABNORMAL HIGH (ref 0.0–149.0)
VLDL: 33.8 mg/dL (ref 0.0–40.0)

## 2021-05-19 LAB — PSA: PSA: 4.16 ng/mL — ABNORMAL HIGH (ref 0.10–4.00)

## 2021-05-19 MED ORDER — DAPAGLIFLOZIN PROPANEDIOL 10 MG PO TABS: 10.0000 mg | ORAL_TABLET | Freq: Every day | ORAL | 3 refills | Status: DC

## 2021-05-19 MED ORDER — CLOPIDOGREL BISULFATE 75 MG PO TABS: 75.0000 mg | ORAL_TABLET | Freq: Once | ORAL | 3 refills | Status: DC

## 2021-05-19 MED ORDER — METFORMIN HCL ER 500 MG PO TB24: 1000.0000 mg | ORAL_TABLET | Freq: Two times a day (BID) | ORAL | 3 refills | Status: DC

## 2021-05-19 MED ORDER — ZOLPIDEM TARTRATE 5 MG PO TABS: 5.0000 mg | ORAL_TABLET | Freq: Every evening | ORAL | 1 refills | Status: DC | PRN

## 2021-05-19 MED ORDER — EXENATIDE ER 2 MG/0.85ML ~~LOC~~ AUIJ: AUTO-INJECTOR | SUBCUTANEOUS | 3 refills | Status: DC

## 2021-05-19 MED ORDER — LOSARTAN POTASSIUM 100 MG PO TABS: 100.0000 mg | ORAL_TABLET | Freq: Every day | ORAL | 3 refills | Status: DC

## 2021-05-19 MED ORDER — PENICILLIN V POTASSIUM 500 MG PO TABS: 500.0000 mg | ORAL_TABLET | Freq: Three times a day (TID) | ORAL | 0 refills | Status: DC

## 2021-05-19 MED ORDER — ALLOPURINOL 300 MG PO TABS: ORAL_TABLET | Freq: Every day | ORAL | 3 refills | Status: DC

## 2021-05-19 MED ORDER — DOXYCYCLINE HYCLATE 100 MG PO CAPS: ORAL_CAPSULE | ORAL | 0 refills | Status: DC

## 2021-05-19 NOTE — Patient Instructions (Addendum)
Great to see you again today!  Congrats on the grand-children!  Please see me in about 6 months Penicillin for your dental infection- let me know if you need anything

## 2021-06-02 DIAGNOSIS — M7632 Iliotibial band syndrome, left leg: Secondary | ICD-10-CM | POA: Diagnosis not present

## 2021-06-02 DIAGNOSIS — S52502D Unspecified fracture of the lower end of left radius, subsequent encounter for closed fracture with routine healing: Secondary | ICD-10-CM | POA: Diagnosis not present

## 2021-07-07 ENCOUNTER — Other Ambulatory Visit: Payer: Self-pay

## 2021-07-07 DIAGNOSIS — I10 Essential (primary) hypertension: Secondary | ICD-10-CM

## 2021-07-07 MED ORDER — NEBIVOLOL HCL 10 MG PO TABS: ORAL_TABLET | Freq: Every evening | ORAL | 3 refills | Status: DC

## 2021-07-10 ENCOUNTER — Encounter: Payer: Self-pay | Admitting: Family Medicine

## 2021-07-10 DIAGNOSIS — L4050 Arthropathic psoriasis, unspecified: Secondary | ICD-10-CM

## 2021-07-10 DIAGNOSIS — R972 Elevated prostate specific antigen [PSA]: Secondary | ICD-10-CM

## 2021-07-19 DIAGNOSIS — G4733 Obstructive sleep apnea (adult) (pediatric): Secondary | ICD-10-CM | POA: Diagnosis not present

## 2021-08-01 ENCOUNTER — Other Ambulatory Visit: Payer: Self-pay | Admitting: Family Medicine

## 2021-08-01 ENCOUNTER — Telehealth: Payer: Self-pay | Admitting: Family Medicine

## 2021-08-01 MED ORDER — NIRMATRELVIR/RITONAVIR (PAXLOVID)TABLET: 3.0000 | ORAL_TABLET | Freq: Two times a day (BID) | ORAL | 0 refills | Status: AC

## 2021-08-01 MED ORDER — MOLNUPIRAVIR EUA 200MG CAPSULE: 4.0000 | ORAL_CAPSULE | Freq: Two times a day (BID) | ORAL | 0 refills | Status: AC

## 2021-08-01 NOTE — Telephone Encounter (Signed)
Call from pt- daughter has covid.  He is leaving for [place] tomorrow .  Will cal in molnupiravir kit for him to have with him ?Fully vaccinated  ?

## 2021-08-17 ENCOUNTER — Telehealth: Payer: Self-pay

## 2021-08-17 NOTE — Telephone Encounter (Signed)
Received PA request via fax for [place] but will disregard since pt is on Farxiga: ? ?05/2021 note: "Change invokana to [place] due to insurance preference" ?

## 2021-08-29 ENCOUNTER — Other Ambulatory Visit: Payer: Self-pay | Admitting: Family Medicine

## 2021-09-02 ENCOUNTER — Other Ambulatory Visit: Payer: Medicare Other

## 2021-09-02 ENCOUNTER — Encounter: Payer: Self-pay | Admitting: Family Medicine

## 2021-09-02 ENCOUNTER — Other Ambulatory Visit (INDEPENDENT_AMBULATORY_CARE_PROVIDER_SITE_OTHER): Payer: Medicare Other

## 2021-09-02 DIAGNOSIS — E119 Type 2 diabetes mellitus without complications: Secondary | ICD-10-CM | POA: Diagnosis not present

## 2021-09-02 DIAGNOSIS — R972 Elevated prostate specific antigen [PSA]: Secondary | ICD-10-CM | POA: Diagnosis not present

## 2021-09-02 LAB — PSA: PSA: 2.5 ng/mL (ref 0.10–4.00)

## 2021-09-02 LAB — HEMOGLOBIN A1C: Hgb A1c MFr Bld: 7.4 % — ABNORMAL HIGH (ref 4.6–6.5)

## 2021-09-07 DIAGNOSIS — R3915 Urgency of urination: Secondary | ICD-10-CM | POA: Diagnosis not present

## 2021-09-07 DIAGNOSIS — R3916 Straining to void: Secondary | ICD-10-CM | POA: Diagnosis not present

## 2021-09-07 DIAGNOSIS — R972 Elevated prostate specific antigen [PSA]: Secondary | ICD-10-CM | POA: Diagnosis not present

## 2021-09-07 DIAGNOSIS — N401 Enlarged prostate with lower urinary tract symptoms: Secondary | ICD-10-CM | POA: Diagnosis not present

## 2021-09-24 ENCOUNTER — Other Ambulatory Visit: Payer: Self-pay | Admitting: Family Medicine

## 2021-09-24 DIAGNOSIS — M1000 Idiopathic gout, unspecified site: Secondary | ICD-10-CM

## 2021-09-30 ENCOUNTER — Telehealth: Payer: Self-pay | Admitting: Family Medicine

## 2021-09-30 NOTE — Telephone Encounter (Signed)
Left message for patient to call back and schedule Medicare Annual Wellness Visit (AWV).   Please offer to do virtually or by telephone.  Left office number and my jabber #[phone].  AWVI eligible as of 09/30/2021  Please schedule at anytime with Nurse Health Advisor.   

## 2021-10-06 ENCOUNTER — Encounter: Payer: Self-pay | Admitting: Family Medicine

## 2021-10-06 ENCOUNTER — Other Ambulatory Visit: Payer: Self-pay | Admitting: Family Medicine

## 2021-10-06 DIAGNOSIS — Z8673 Personal history of transient ischemic attack (TIA), and cerebral infarction without residual deficits: Secondary | ICD-10-CM

## 2021-10-06 MED ORDER — CLOPIDOGREL BISULFATE 75 MG PO TABS: 75.0000 mg | ORAL_TABLET | Freq: Once | ORAL | 0 refills | Status: DC

## 2021-10-06 MED ORDER — CLOPIDOGREL BISULFATE 75 MG PO TABS: 75.0000 mg | ORAL_TABLET | Freq: Every day | ORAL | 3 refills | Status: DC

## 2021-10-11 ENCOUNTER — Ambulatory Visit: Payer: Medicare Other

## 2021-10-11 ENCOUNTER — Telehealth: Payer: Self-pay

## 2021-10-11 NOTE — Progress Notes (Deleted)
Subjective:   Phillip [name] is a 71 y.o. male who presents for an Initial Medicare Annual Wellness Visit.  I connected with Phillip [name] today by telephone and verified that I am speaking with the correct person using two identifiers. Location patient: home Location provider: work Persons participating in the virtual visit: patient, Engineer, civil (consulting).    I discussed the limitations, risks, security and privacy concerns of performing an evaluation and management service by telephone and the availability of in person appointments. I also discussed with the patient that there may be a patient responsible charge related to this service. The patient expressed understanding and verbally consented to this telephonic visit.    Interactive audio and video telecommunications were attempted between this provider and patient, however failed, due to patient having technical difficulties OR patient did not have access to video capability.  We continued and completed visit with audio only.  Some vital signs may be absent or patient reported.   Time Spent with patient on telephone encounter: *** minutes   Review of Systems    ***       Objective:    There were no vitals filed for this visit. There is no height or weight on file to calculate BMI.     02/12/2021    9:28 AM 12/07/2017    8:55 AM 04/17/2013    6:00 PM 11/20/2012    4:05 PM 11/20/2012    6:01 AM 11/06/2012    2:45 PM  Advanced Directives  Does Patient Have a Medical Advance Directive? No Yes Patient does not have advance directive;Patient would not like information Patient does not have advance directive  Patient does not have advance directive;Patient would not like information  Type of Customer service manager Power of [place];Living will      Does patient want to make changes to medical advance directive?  No - Patient declined      Copy of Healthcare Power of Attorney in Chart?  No - copy requested      Pre-existing out of facility DNR  order (yellow form or pink MOST form)   No  No     Current Medications (verified) Outpatient Encounter Medications as of 10/11/2021  Medication Sig   acetaminophen (TYLENOL) 650 MG CR tablet Take 1,300 mg by mouth every 8 (eight) hours as needed for pain.   Alcohol Swabs PADS Use to test up to 4 times daily   ALEVE 220 MG tablet Take 220-440 mg by mouth 2 (two) times daily as needed (for headaches).   allopurinol (ZYLOPRIM) 300 MG tablet TAKE 1 TABLET BY MOUTH DAILY   blood glucose meter kit and supplies KIT Pt needs One Touch Ultra glucometer.  Use up to BID to check blood sugars   clopidogrel (PLAVIX) 75 MG tablet Take 1 tablet (75 mg total) by mouth daily.   dapagliflozin propanediol (FARXIGA) 10 MG TABS tablet Take 1 tablet (10 mg total) by mouth daily before breakfast.   diclofenac sodium (VOLTAREN) 1 % GEL Apply 2-4 g topically 2 (two) times daily as needed (for pain).   doxycycline (VIBRAMYCIN) 100 MG capsule TAKE 1 CAPSULE BY MOUTH DAILY. USE FOR MALARIA PROPHYLAXIS   Exenatide ER 2 MG/0.85ML AUIJ INJECT 2 MG INTO THE SKIN ONCE A WEEK.   glucose blood (FREESTYLE LITE) test strip USE TO TEST UP TO 4 TIMES DAILY   hydrocortisone-pramoxine (ANALPRAM HC) 2.5-1 % rectal cream Place 1 application rectally 3 (three) times daily. (Patient not taking: Reported on 02/12/2021)  insulin glargine (LANTUS SOLOSTAR) 100 UNIT/ML Solostar Pen Inject 30 Units into the skin daily. (Patient taking differently: Inject 30 Units into the skin at bedtime.)   Insulin Pen Needle (PEN NEEDLES) 32G X 5 MM MISC 1 each by Does not apply route daily. Use for basal insulin injection   losartan (COZAAR) 100 MG tablet Take 1 tablet (100 mg total) by mouth daily.   metFORMIN (GLUCOPHAGE-XR) 500 MG 24 hr tablet Take 2 tablets (1,000 mg total) by mouth 2 (two) times daily.   nebivolol (BYSTOLIC) 10 MG tablet TAKE 1 TABLET BY MOUTH EVERY EVENING.   penicillin v potassium (VEETID) 500 MG tablet Take 1 tablet (500 mg total)  by mouth 3 (three) times daily.   QUICKVUE AT-HOME COVID-19 TEST KIT TEST AS DIRECTED TODAY   rosuvastatin (CRESTOR) 5 MG tablet TAKE 1 TABLET (5 MG TOTAL) BY MOUTH EVERY MONDAY, WEDNESDAY, AND FRIDAY.   SM LANCETS 33G MISC USE TO TEST BLOOD SUGAR TWICE DAILY   zolpidem (AMBIEN) 5 MG tablet Take 1 tablet (5 mg total) by mouth at bedtime as needed for sleep.   No facility-administered encounter medications on file as of 10/11/2021.    Allergies (verified) Sulfa antibiotics   History: Past Medical History:  Diagnosis Date   Anemia    Arthritis    Diabetes mellitus    Gout    Hyperlipidemia    Hypertension    Localized osteoarthritis of left knee 11/20/2012   Migraine headache with aura    Neuromuscular disorder ([place])    " mild periferal neauropathy   Osteoarthritis of left hip 11/20/2012   PVC (premature ventricular contraction)    Sleep apnea    cpap   Past Surgical History:  Procedure Laterality Date   CHOLECYSTECTOMY  1997   CVA     CVA, occiptal   CVA     CVA, occipital   knee arthoscopy  2006   Left Knee   LEFT HEART CATH AND CORONARY ANGIOGRAPHY N/A 12/07/2017   Procedure: LEFT HEART CATH AND CORONARY ANGIOGRAPHY;  Surgeon: Troy Sine, MD;  Location: [facility];  Service: Cardiovascular;  Laterality: N/A;   left total hip     left unicarpmental knee     LOOP RECORDER IMPLANT N/A 04/19/2013   Procedure: LOOP RECORDER IMPLANT;  Surgeon: Evans Lance, MD;  Location: [facility];  Service: Cardiovascular;  Laterality: N/A;   PARTIAL KNEE ARTHROPLASTY Left 11/20/2012   Procedure: UNICOMPARTMENTAL KNEE;  Surgeon: Johnny Bridge, MD;  Location: [facility];  Service: Orthopedics;  Laterality: Left;   REPLACEMENT UNICONDYLAR JOINT KNEE Left 11/20/2012   Dr Mardelle Matte   TEE WITHOUT CARDIOVERSION N/A 04/19/2013   Procedure: TRANSESOPHAGEAL ECHOCARDIOGRAM (TEE);  Surgeon: Larey Dresser, MD;  Location: [place];  Service: Cardiovascular;  Laterality: N/A;    TONSILLECTOMY AND ADENOIDECTOMY     Childhood   TOTAL HIP ARTHROPLASTY Left 11/20/2012   Dr Mardelle Matte   TOTAL HIP ARTHROPLASTY Left 11/20/2012   Procedure: TOTAL HIP ARTHROPLASTY;  Surgeon: Johnny Bridge, MD;  Location: [place];  Service: Orthopedics;  Laterality: Left;   Family History  Problem Relation Age of Onset   Cancer Mother        Breast   Stroke Mother    Stroke Maternal Grandmother    Stroke Maternal Aunt    Stroke Maternal Uncle    Social History   Socioeconomic History   Marital status: Married    Spouse name: Learta Codding   Number  of children: 4   Years of education: 12   Highest education level: Not on file  Occupational History   Occupation: MD    Employer: [employer]  Tobacco Use   Smoking status: Never   Smokeless tobacco: Never  Substance and Sexual Activity   Alcohol use: No    Alcohol/week: 0.0 standard drinks of alcohol   Drug use: No   Sexual activity: Not on file  Other Topics Concern   Not on file  Social History Narrative   Patient lives at home with family.   Caffeine Use: occasionally tea   Social Determinants of Health   Financial Resource Strain: Not on file  Food Insecurity: Not on file  Transportation Needs: Not on file  Physical Activity: Not on file  Stress: Not on file  Social Connections: Not on file    Tobacco Counseling Counseling given: Not Answered   Clinical Intake:                 Diabetes:  Is the patient diabetic?  Yes  If diabetic, was a CBG obtained today?  No  Did the patient bring in their glucometer from home?  No phone visit How often do you monitor your CBG's? ***.   Financial Strains and Diabetes Management:  Are you having any financial strains with the device, your supplies or your medication? {YES/NO:21197}.  Does the patient want to be seen by Chronic Care Management for management of their diabetes?  {YES/NO:21197} Would the patient like to be referred to a Nutritionist or for Diabetic  Management?  {YES/NO:21197}  Diabetic Exams:  Diabetic Eye Exam: Completed 05/12/2021.   Diabetic Foot Exam:Pt has been advised about the importance in completing this exam. To be completed by PCP.           Activities of Daily Living     No data to display          Patient Care Team: Copland, Gay Filler, MD as PCP - General (Family Medicine)  Indicate any recent Medical Services you may have received from other than [facility] providers in the past year (date may be approximate).     Assessment:   This is a routine wellness examination for [place].  Hearing/Vision screen No results found.  Dietary issues and exercise activities discussed:     Goals Addressed   None    Depression Screen    05/19/2021    4:00 PM 04/27/2020    9:06 AM 01/16/2017    9:00 AM 10/30/2015    3:12 PM 07/21/2015    2:26 PM  PHQ 2/9 Scores  PHQ - 2 Score 0 0 0 0 0  Exception Documentation   Patient refusal      Fall Risk    05/19/2021    4:00 PM 04/27/2020    9:05 AM 01/16/2017    9:00 AM 10/30/2015    3:12 PM  [place] in the past year? 0 0 No No  Number falls in past yr: 0 0    Injury with Fall? 0 0    Follow up  Falls evaluation completed      FALL RISK PREVENTION PERTAINING TO THE HOME:  Any stairs in or around the home? {YES/NO:21197} If so, are there any without handrails? {YES/NO:21197} Home free of loose throw rugs in walkways, pet beds, electrical cords, etc? {YES/NO:21197} Adequate lighting in your home to reduce risk of falls? {YES/NO:21197}  ASSISTIVE DEVICES UTILIZED TO PREVENT FALLS:  Life  alert? {YES/NO:21197} Use of a cane, walker or w/c? {YES/NO:21197} Grab bars in the bathroom? {YES/NO:21197} Shower chair or bench in shower? {YES/NO:21197} Elevated toilet seat or a handicapped toilet? {YES/NO:21197}  TIMED UP AND GO:  Was the test performed? No . Phone visit   Cognitive Function:        Immunizations Immunization History  Administered  Date(s) Administered   Fluad Quad(high Dose 65+) 01/25/2019, 01/21/2020   Influenza Split 02/01/2012   Influenza, High Dose Seasonal PF 01/16/2017   Influenza-Unspecified 01/24/2014, 12/31/2020   PFIZER(Purple Top)SARS-COV-2 Vaccination 05/01/2019, 05/21/2019, 01/01/2020, 07/31/2020   PPD Test 01/31/2014   Pneumococcal Conjugate-13 07/25/2014   Pneumococcal Polysaccharide-23 04/20/2016   Tdap 04/20/2016   Typhoid Live 01/16/2017   Zoster, Live 06/11/2010    TDAP status: Up to date  Flu Vaccine status: Up to date  Pneumococcal vaccine status: Up to date  Covid-19 vaccine status: Information provided on how to obtain vaccines.   Qualifies for Shingles Vaccine? Yes   Zostavax completed Yes   Shingrix Completed?: No.    Education has been provided regarding the importance of this vaccine. Patient has been advised to call insurance company to determine out of pocket expense if they have not yet received this vaccine. Advised may also receive vaccine at local pharmacy or Health Dept. Verbalized acceptance and understanding.  Screening Tests Health Maintenance  Topic Date Due   Zoster Vaccines- Shingrix (1 of 2) Never done   COVID-19 Vaccine (5 - Booster for Pfizer series) 09/25/2020   FOOT EXAM  09/07/2021   INFLUENZA VACCINE  11/30/2021   HEMOGLOBIN A1C  03/05/2022   OPHTHALMOLOGY EXAM  05/12/2022   COLONOSCOPY (Pts 45-39yrs Insurance coverage will need to be confirmed)  06/05/2022   TETANUS/TDAP  04/20/2026   Pneumonia Vaccine 42+ Years old  Completed   Hepatitis C Screening  Completed   HPV VACCINES  Aged Out    Health Maintenance  Health Maintenance Due  Topic Date Due   Zoster Vaccines- Shingrix (1 of 2) Never done   COVID-19 Vaccine (5 - Booster for Pfizer series) 09/25/2020   FOOT EXAM  09/07/2021    {Colorectal cancer screening:2101809}  Lung Cancer Screening: (Low Dose CT Chest recommended if Age 18-80 years, 30 pack-year currently smoking OR have quit w/in  15years.) {DOES NOT does:27190::"does not"} qualify.   Lung Cancer Screening Referral: ***  Additional Screening:  Hepatitis C Screening: Completed 04/20/2016  Vision Screening: Recommended annual ophthalmology exams for early detection of glaucoma and other disorders of the eye. Is the patient up to date with their annual eye exam?  Yes  Who is the provider or what is the name of the office in which the patient attends annual eye exams? ***   Dental Screening: Recommended annual dental exams for proper oral hygiene  Community Resource Referral / Chronic Care Management: CRR required this visit?  {YES/NO:21197}  CCM required this visit?  {YES/NO:21197}     Plan:     I have personally reviewed and noted the following in the patient's chart:   Medical and social history Use of alcohol, tobacco or illicit drugs  Current medications and supplements including opioid prescriptions. {Opioid [phone]} Functional ability and status Nutritional status Physical activity Advanced directives List of other physicians Hospitalizations, surgeries, and ER visits in previous 12 months Vitals Screenings to include cognitive, depression, and falls Referrals and appointments  In addition, I have reviewed and discussed with patient certain preventive protocols, quality metrics, and best practice recommendations. A written personalized  care plan for preventive services as well as general preventive health recommendations were provided to patient.   Due to this being a telephonic visit, the after visit summary with patients personalized plan was offered to patient via mail or my-chart. Patient would like to access on my-chart.   Marta Antu, LPN   5/46/2703  Nurse Health Advisor  Nurse Notes: ***

## 2021-10-11 NOTE — Telephone Encounter (Signed)
Patient had a 2:45  phone visit scheduled for his Medicare Wellness visit today. Attempted to reach patient x 3. Left message to call back.

## 2021-10-14 ENCOUNTER — Ambulatory Visit (INDEPENDENT_AMBULATORY_CARE_PROVIDER_SITE_OTHER): Payer: Medicare Other

## 2021-10-14 DIAGNOSIS — Z0000 Encounter for general adult medical examination without abnormal findings: Secondary | ICD-10-CM | POA: Diagnosis not present

## 2021-10-14 NOTE — Patient Instructions (Signed)
Phillip [name] , Thank you for taking time to come for your Medicare Wellness Visit. I appreciate your ongoing commitment to your health goals. Please review the following plan we discussed and let me know if I can assist you in the future.   Screening recommendations/referrals: Colonoscopy: 06/05/12 due 06/05/22 Recommended yearly ophthalmology/optometry visit for glaucoma screening and checkup Recommended yearly dental visit for hygiene and checkup  Vaccinations: Influenza vaccine: up to date Pneumococcal vaccine: up to date Tdap vaccine: up to date Shingles vaccine: Due-May obtain vaccine at your local pharmacy.    Covid-19: Due-May obtain vaccine at our your local pharmacy.   Advanced directives: yes, not on file  Conditions/risks identified: see problem list  Next appointment: Follow up in one year for your annual wellness visit.   Preventive Care 31 Years and Older, Male Preventive care refers to lifestyle choices and visits with your health care provider that can promote health and wellness. What does preventive care include? A yearly physical exam. This is also called an annual well check. Dental exams once or twice a year. Routine eye exams. Ask your health care provider how often you should have your eyes checked. Personal lifestyle choices, including: Daily care of your teeth and gums. Regular physical activity. Eating a healthy diet. Avoiding tobacco and drug use. Limiting alcohol use. Practicing safe sex. Taking low doses of aspirin every day. Taking vitamin and mineral supplements as recommended by your health care provider. What happens during an annual well check? The services and screenings done by your health care provider during your annual well check will depend on your age, overall health, lifestyle risk factors, and family history of disease. Counseling  Your health care provider may ask you questions about your: Alcohol use. Tobacco use. Drug use. Emotional  well-being. Home and relationship well-being. Sexual activity. Eating habits. History of falls. Memory and ability to understand (cognition). Work and work Statistician. Screening  You may have the following tests or measurements: Height, weight, and BMI. Blood pressure. Lipid and cholesterol levels. These may be checked every 5 years, or more frequently if you are over 70 years old. Skin check. Lung cancer screening. You may have this screening every year starting at age 14 if you have a 30-pack-year history of smoking and currently smoke or have quit within the past 15 years. Fecal occult blood test (FOBT) of the stool. You may have this test every year starting at age 71. Flexible sigmoidoscopy or colonoscopy. You may have a sigmoidoscopy every 5 years or a colonoscopy every 10 years starting at age 40. Prostate cancer screening. Recommendations will vary depending on your family history and other risks. Hepatitis C blood test. Hepatitis B blood test. Sexually transmitted disease (STD) testing. Diabetes screening. This is done by checking your blood sugar (glucose) after you have not eaten for a while (fasting). You may have this done every 1-3 years. Abdominal aortic aneurysm (AAA) screening. You may need this if you are a current or former smoker. Osteoporosis. You may be screened starting at age 62 if you are at high risk. Talk with your health care provider about your test results, treatment options, and if necessary, the need for more tests. Vaccines  Your health care provider may recommend certain vaccines, such as: Influenza vaccine. This is recommended every year. Tetanus, diphtheria, and acellular pertussis (Tdap, Td) vaccine. You may need a Td booster every 10 years. Zoster vaccine. You may need this after age 43. Pneumococcal 13-valent conjugate (PCV13) vaccine. One dose  is recommended after age 49. Pneumococcal polysaccharide (PPSV23) vaccine. One dose is recommended after  age 63. Talk to your health care provider about which screenings and vaccines you need and how often you need them. This information is not intended to replace advice given to you by your health care provider. Make sure you discuss any questions you have with your health care provider. Document Released: 05/15/2015 Document Revised: 01/06/2016 Document Reviewed: 02/17/2015 Elsevier Interactive Patient Education  2017 [place] Prevention in the Home Falls can cause injuries. They can happen to people of all ages. There are many things you can do to make your home safe and to help prevent falls. What can I do on the outside of my home? Regularly fix the edges of walkways and driveways and fix any cracks. Remove anything that might make you trip as you walk through a door, such as a raised step or threshold. Trim any bushes or trees on the path to your home. Use bright outdoor lighting. Clear any walking paths of anything that might make someone trip, such as rocks or tools. Regularly check to see if handrails are loose or broken. Make sure that both sides of any steps have handrails. Any raised decks and porches should have guardrails on the edges. Have any leaves, snow, or ice cleared regularly. Use sand or salt on walking paths during winter. Clean up any spills in your garage right away. This includes oil or grease spills. What can I do in the bathroom? Use night lights. Install grab bars by the toilet and in the tub and shower. Do not use towel bars as grab bars. Use non-skid mats or decals in the tub or shower. If you need to sit down in the shower, use a plastic, non-slip stool. Keep the floor dry. Clean up any water that spills on the floor as soon as it happens. Remove soap buildup in the tub or shower regularly. Attach bath mats securely with double-sided non-slip rug tape. Do not have throw rugs and other things on the floor that can make you trip. What can I do in the  bedroom? Use night lights. Make sure that you have a light by your bed that is easy to reach. Do not use any sheets or blankets that are too big for your bed. They should not hang down onto the floor. Have a firm chair that has side arms. You can use this for support while you get dressed. Do not have throw rugs and other things on the floor that can make you trip. What can I do in the kitchen? Clean up any spills right away. Avoid walking on wet floors. Keep items that you use a lot in easy-to-reach places. If you need to reach something above you, use a strong step stool that has a grab bar. Keep electrical cords out of the way. Do not use floor polish or wax that makes floors slippery. If you must use wax, use non-skid floor wax. Do not have throw rugs and other things on the floor that can make you trip. What can I do with my stairs? Do not leave any items on the stairs. Make sure that there are handrails on both sides of the stairs and use them. Fix handrails that are broken or loose. Make sure that handrails are as long as the stairways. Check any carpeting to make sure that it is firmly attached to the stairs. Fix any carpet that is loose or worn. Avoid  having throw rugs at the top or bottom of the stairs. If you do have throw rugs, attach them to the floor with carpet tape. Make sure that you have a light switch at the top of the stairs and the bottom of the stairs. If you do not have them, ask someone to add them for you. What else can I do to help prevent falls? Wear shoes that: Do not have high heels. Have rubber bottoms. Are comfortable and fit you well. Are closed at the toe. Do not wear sandals. If you use a stepladder: Make sure that it is fully opened. Do not climb a closed stepladder. Make sure that both sides of the stepladder are locked into place. Ask someone to hold it for you, if possible. Clearly mark and make sure that you can see: Any grab bars or  handrails. First and last steps. Where the edge of each step is. Use tools that help you move around (mobility aids) if they are needed. These include: Canes. Walkers. Scooters. Crutches. Turn on the lights when you go into a dark area. Replace any light bulbs as soon as they burn out. Set up your furniture so you have a clear path. Avoid moving your furniture around. If any of your floors are uneven, fix them. If there are any pets around you, be aware of where they are. Review your medicines with your doctor. Some medicines can make you feel dizzy. This can increase your chance of falling. Ask your doctor what other things that you can do to help prevent falls. This information is not intended to replace advice given to you by your health care provider. Make sure you discuss any questions you have with your health care provider. Document Released: 02/12/2009 Document Revised: 09/24/2015 Document Reviewed: 05/23/2014 Elsevier Interactive Patient Education  2017 [employer].

## 2021-10-14 NOTE — Progress Notes (Signed)
Subjective:   MALIK PAAR is a 71 y.o. male who presents for an Initial Medicare Annual  Wellness Visit.  I connected with  Posey Boyer on 10/14/21 by a audio enabled telemedicine application and verified that I am speaking with the correct person using two identifiers.  Patient Location: Home  Provider Location: Office/Clinic  I discussed the limitations of evaluation and management by telemedicine. The patient expressed understanding and agreed to proceed.   Review of Systems     Cardiac Risk Factors include: hypertension;dyslipidemia;diabetes mellitus     Objective:    There were no vitals filed for this visit. There is no height or weight on file to calculate BMI.     10/14/2021    3:05 PM 02/12/2021    9:28 AM 12/07/2017    8:55 AM 04/17/2013    6:00 PM 11/20/2012    4:05 PM 11/20/2012    6:01 AM 11/06/2012    2:45 PM  Advanced Directives  Does Patient Have a Medical Advance Directive? Yes No Yes Patient does not have advance directive;Patient would not like information Patient does not have advance directive  Patient does not have advance directive;Patient would not like information  Type of Scientist, forensic Power of [place];Out of facility DNR (pink MOST or yellow form);Living will  [place];Living will      Does patient want to make changes to medical advance directive? No - Patient declined  No - Patient declined      Copy of [place] in Chart? No - copy requested  No - copy requested      Would patient like information on creating a medical advance directive? No - Patient declined        Pre-existing out of facility DNR order (yellow form or pink MOST form)    No  No     Current Medications (verified) Outpatient Encounter Medications as of 10/14/2021  Medication Sig   acetaminophen (TYLENOL) 650 MG CR tablet Take 1,300 mg by mouth every 8 (eight) hours as needed for pain.   Alcohol Swabs PADS Use to test up  to 4 times daily   ALEVE 220 MG tablet Take 220-440 mg by mouth 2 (two) times daily as needed (for headaches).   allopurinol (ZYLOPRIM) 300 MG tablet TAKE 1 TABLET BY MOUTH DAILY   blood glucose meter kit and supplies KIT Pt needs One Touch Ultra glucometer.  Use up to BID to check blood sugars   clopidogrel (PLAVIX) 75 MG tablet Take 1 tablet (75 mg total) by mouth daily.   dapagliflozin propanediol (FARXIGA) 10 MG TABS tablet Take 1 tablet (10 mg total) by mouth daily before breakfast.   diclofenac sodium (VOLTAREN) 1 % GEL Apply 2-4 g topically 2 (two) times daily as needed (for pain).   Exenatide ER 2 MG/0.85ML AUIJ INJECT 2 MG INTO THE SKIN ONCE A WEEK.   glucose blood (FREESTYLE LITE) test strip USE TO TEST UP TO 4 TIMES DAILY   hydrocortisone-pramoxine (ANALPRAM HC) 2.5-1 % rectal cream Place 1 application rectally 3 (three) times daily.   insulin glargine (LANTUS SOLOSTAR) 100 UNIT/ML Solostar Pen Inject 30 Units into the skin daily. (Patient taking differently: Inject 30 Units into the skin at bedtime.)   Insulin Pen Needle (PEN NEEDLES) 32G X 5 MM MISC 1 each by Does not apply route daily. Use for basal insulin injection   losartan (COZAAR) 100 MG tablet Take 1 tablet (100 mg total) by  mouth daily.   metFORMIN (GLUCOPHAGE-XR) 500 MG 24 hr tablet Take 2 tablets (1,000 mg total) by mouth 2 (two) times daily.   nebivolol (BYSTOLIC) 10 MG tablet TAKE 1 TABLET BY MOUTH EVERY EVENING.   QUICKVUE AT-HOME COVID-19 TEST KIT TEST AS DIRECTED TODAY   rosuvastatin (CRESTOR) 5 MG tablet TAKE 1 TABLET (5 MG TOTAL) BY MOUTH EVERY MONDAY, WEDNESDAY, AND FRIDAY.   SM LANCETS 33G MISC USE TO TEST BLOOD SUGAR TWICE DAILY   zolpidem (AMBIEN) 5 MG tablet Take 1 tablet (5 mg total) by mouth at bedtime as needed for sleep.   [DISCONTINUED] doxycycline (VIBRAMYCIN) 100 MG capsule TAKE 1 CAPSULE BY MOUTH DAILY. USE FOR MALARIA PROPHYLAXIS   [DISCONTINUED] penicillin v potassium (VEETID) 500 MG tablet Take 1  tablet (500 mg total) by mouth 3 (three) times daily.   No facility-administered encounter medications on file as of 10/14/2021.    Allergies (verified) Sulfa antibiotics   History: Past Medical History:  Diagnosis Date   Anemia    Arthritis    Diabetes mellitus    Gout    Hyperlipidemia    Hypertension    Localized osteoarthritis of left knee 11/20/2012   Migraine headache with aura    Neuromuscular disorder ([place])    " mild periferal neauropathy   Osteoarthritis of left hip 11/20/2012   PVC (premature ventricular contraction)    Sleep apnea    cpap   Past Surgical History:  Procedure Laterality Date   CHOLECYSTECTOMY  1997   CVA     CVA, occiptal   CVA     CVA, occipital   knee arthoscopy  2006   Left Knee   LEFT HEART CATH AND CORONARY ANGIOGRAPHY N/A 12/07/2017   Procedure: LEFT HEART CATH AND CORONARY ANGIOGRAPHY;  Surgeon: Troy Sine, MD;  Location: [facility];  Service: Cardiovascular;  Laterality: N/A;   left total hip     left unicarpmental knee     LOOP RECORDER IMPLANT N/A 04/19/2013   Procedure: LOOP RECORDER IMPLANT;  Surgeon: Evans Lance, MD;  Location: [facility];  Service: Cardiovascular;  Laterality: N/A;   PARTIAL KNEE ARTHROPLASTY Left 11/20/2012   Procedure: UNICOMPARTMENTAL KNEE;  Surgeon: Johnny Bridge, MD;  Location: [facility];  Service: Orthopedics;  Laterality: Left;   REPLACEMENT UNICONDYLAR JOINT KNEE Left 11/20/2012   Dr Mardelle Matte   TEE WITHOUT CARDIOVERSION N/A 04/19/2013   Procedure: TRANSESOPHAGEAL ECHOCARDIOGRAM (TEE);  Surgeon: Larey Dresser, MD;  Location: [facility];  Service: Cardiovascular;  Laterality: N/A;   TONSILLECTOMY AND ADENOIDECTOMY     Childhood   TOTAL HIP ARTHROPLASTY Left 11/20/2012   Dr Mardelle Matte   TOTAL HIP ARTHROPLASTY Left 11/20/2012   Procedure: TOTAL HIP ARTHROPLASTY;  Surgeon: Johnny Bridge, MD;  Location: [place] [facility];  Service: Orthopedics;  Laterality: Left;   Family History  Problem Relation Age of  Onset   Cancer Mother        Breast   Stroke Mother    Stroke Maternal Grandmother    Stroke Maternal Aunt    Stroke Maternal Uncle    Social History   Socioeconomic History   Marital status: Married    Spouse name: Learta Codding   Number of children: 4   Years of education: 23   Highest education level: Not on file  Occupational History   Occupation: MD    Employer: [employer]  Tobacco Use   Smoking status: Never   Smokeless tobacco: Never  Substance and  Sexual Activity   Alcohol use: No    Alcohol/week: 0.0 standard drinks of alcohol   Drug use: No   Sexual activity: Not on file  Other Topics Concern   Not on file  Social History Narrative   Patient lives at home with family.   Caffeine Use: occasionally tea   Social Determinants of Radio broadcast assistant Strain: Not on file  Food Insecurity: Not on file  Transportation Needs: Not on file  Physical Activity: Not on file  Stress: Not on file  Social Connections: Not on file    Tobacco Counseling Counseling given: Not Answered   Clinical Intake:  Pre-visit preparation completed: Yes  Pain : No/denies pain     BMI - recorded: 31.93 Nutritional Status: BMI > 30  Obese Nutritional Risks: None Diabetes: Yes CBG done?: No Did pt. bring in CBG monitor from home?: No  How often do you need to have someone help you when you read instructions, pamphlets, or other written materials from your doctor or pharmacy?: 1 - Never  Diabetic?yes Nutrition Risk Assessment:  Has the patient had any N/V/D within the last 2 months?  No  Does the patient have any non-healing wounds?  No  Has the patient had any unintentional weight loss or weight gain?  No   Diabetes:  Is the patient diabetic?  Yes  If diabetic, was a CBG obtained today?  No  Did the patient bring in their glucometer from home?  No  How often do you monitor your CBG's? Randomly.   Financial Strains and Diabetes Management:  Are you having any  financial strains with the device, your supplies or your medication? No .  Does the patient want to be seen by Chronic Care Management for management of their diabetes?  No  Would the patient like to be referred to a Nutritionist or for Diabetic Management?  No   Diabetic Exams:  Diabetic Eye Exam: Completed 05/12/21 Diabetic Foot Exam: Overdue, Pt has been advised about the importance in completing this exam. Pt is scheduled for diabetic foot exam on n/a.   Interpreter Needed?: No  Information entered by :: [place] [facility]    10/14/2021    3:11 PM  In your present state of health, do you have any difficulty performing the following activities:  Hearing? 1  Vision? 0  Difficulty concentrating or making decisions? 0  Walking or climbing stairs? 1  Dressing or bathing? 0  Doing errands, shopping? 0  Preparing Food and eating ? N  Using the Toilet? N  In the past six months, have you accidently leaked urine? N  Do you have problems with loss of bowel control? N  Managing your Medications? N  Managing your Finances? N  Housekeeping or managing your Housekeeping? N    Patient Care Team: Copland, Gay Filler, MD as PCP - General (Family Medicine)  Indicate any recent Medical Services you may have received from other than [facility] providers in the past year (date may be approximate).     Assessment:   This is a routine wellness examination for [place].  Hearing/Vision screen No results found.  Dietary issues and exercise activities discussed: Current Exercise Habits: Home exercise routine, Type of exercise: walking, Time (Minutes): 40, Frequency (Times/Week): 7, Weekly Exercise (Minutes/Week): 280, Intensity: Mild, Exercise limited by: None identified   Goals Addressed   None    Depression Screen    10/14/2021    3:06 PM 05/19/2021  4:00 PM 04/27/2020    9:06 AM 01/16/2017    9:00 AM 10/30/2015    3:12 PM 07/21/2015    2:26 PM  PHQ 2/9 Scores   PHQ - 2 Score 0 0 0 0 0 0  Exception Documentation    Patient refusal      Fall Risk    10/14/2021    3:06 PM 05/19/2021    4:00 PM 04/27/2020    9:05 AM 01/16/2017    9:00 AM 10/30/2015    3:12 PM  [place] in the past year? 0 0 0 No No  Number falls in past yr: 0 0 0    Injury with Fall? 0 0 0    Risk for fall due to : No Fall Risks      Follow up Falls evaluation completed  Falls evaluation completed      [place]:  Any stairs in or around the home? Yes  If so, are there any without handrails? No  Home free of loose throw rugs in walkways, pet beds, electrical cords, etc? Yes  Adequate lighting in your home to reduce risk of falls? Yes   ASSISTIVE DEVICES UTILIZED TO PREVENT FALLS:  Life alert? No  Use of a cane, walker or w/c? No  Grab bars in the bathroom? No  Shower chair or bench in shower? Yes  Elevated toilet seat or a handicapped toilet? No   TIMED UP AND GO:  Was the test performed? No .    Cognitive Function:        10/14/2021    3:15 PM  6CIT Screen  What Year? 0 points  What month? 0 points  What time? 0 points  Count back from 20 0 points  Months in reverse 0 points  Repeat phrase 0 points  Total Score 0 points    Immunizations Immunization History  Administered Date(s) Administered   Fluad Quad(high Dose 65+) 01/25/2019, 01/21/2020   Influenza Split 02/01/2012   Influenza, High Dose Seasonal PF 01/16/2017   Influenza-Unspecified 01/24/2014, 12/31/2020   PFIZER(Purple Top)SARS-COV-2 Vaccination 05/01/2019, 05/21/2019, 01/01/2020, 07/31/2020   PPD Test 01/31/2014   Pneumococcal Conjugate-13 07/25/2014   Pneumococcal Polysaccharide-23 04/20/2016   Tdap 04/20/2016   Typhoid Live 01/16/2017   Zoster, Live 06/11/2010    TDAP status: Up to date  Flu Vaccine status: Up to date  Pneumococcal vaccine status: Up to date  Covid-19 vaccine status: Information provided on how to obtain vaccines.    Qualifies for Shingles Vaccine? Yes   Zostavax completed No   Shingrix Completed?: No.    Education has been provided regarding the importance of this vaccine. Patient has been advised to call insurance company to determine out of pocket expense if they have not yet received this vaccine. Advised may also receive vaccine at local pharmacy or Health Dept. Verbalized acceptance and understanding.  Screening Tests Health Maintenance  Topic Date Due   Zoster Vaccines- Shingrix (1 of 2) Never done   COVID-19 Vaccine (5 - Booster for Pfizer series) 09/25/2020   FOOT EXAM  09/07/2021   INFLUENZA VACCINE  11/30/2021   HEMOGLOBIN A1C  03/05/2022   OPHTHALMOLOGY EXAM  05/12/2022   COLONOSCOPY (Pts 45-53yr Insurance coverage will need to be confirmed)  06/05/2022   TETANUS/TDAP  04/20/2026   Pneumonia Vaccine 71 Years old  Completed   Hepatitis C Screening  Completed   HPV VACCINES  Aged Out    Health Maintenance  Health Maintenance Due  Topic Date Due   Zoster Vaccines- Shingrix (1 of 2) Never done   COVID-19 Vaccine (5 - Booster for Pfizer series) 09/25/2020   FOOT EXAM  09/07/2021    Colorectal cancer screening: Type of screening: Colonoscopy. Completed 06/05/12. Repeat every 10 years  Lung Cancer Screening: (Low Dose CT Chest recommended if Age 15-80 years, 30 pack-year currently smoking OR have quit w/in 15years.) does not qualify.   Lung Cancer Screening Referral: N/A  Additional Screening:  Hepatitis C Screening: does qualify; Completed 04/20/16  Vision Screening: Recommended annual ophthalmology exams for early detection of glaucoma and other disorders of the eye. Is the patient up to date with their annual eye exam?  Yes  Who is the provider or what is the name of the office in which the patient attends annual eye exams? Dr. [place] If pt is not established with a provider, would they like to be referred to a provider to establish care? No .   Dental Screening: Recommended  annual dental exams for proper oral hygiene  Community Resource Referral / Chronic Care Management: CRR required this visit?  No   CCM required this visit?  No      Plan:     I have personally reviewed and noted the following in the patient's chart:   Medical and social history Use of alcohol, tobacco or illicit drugs  Current medications and supplements including opioid prescriptions. Patient is not currently taking opioid prescriptions. Functional ability and status Nutritional status Physical activity Advanced directives List of other physicians Hospitalizations, surgeries, and ER visits in previous 12 months Vitals Screenings to include cognitive, depression, and falls Referrals and appointments  In addition, I have reviewed and discussed with patient certain preventive protocols, quality metrics, and best practice recommendations. A written personalized care plan for preventive services as well as general preventive health recommendations were provided to patient.   Due to this being a telephonic visit, the after visit summary with patients personalized plan was offered to patient via mail or my-chart. Patient would like to access on my-chart.  Duard Brady Katlynne Mckercher, [place]   10/14/2021   Nurse Notes: none

## 2021-11-03 ENCOUNTER — Other Ambulatory Visit: Payer: Self-pay | Admitting: Family Medicine

## 2021-11-10 DIAGNOSIS — G4733 Obstructive sleep apnea (adult) (pediatric): Secondary | ICD-10-CM | POA: Diagnosis not present

## 2021-11-16 ENCOUNTER — Telehealth: Payer: Self-pay | Admitting: *Deleted

## 2021-11-16 NOTE — Telephone Encounter (Signed)
Left vm to return call.    

## 2021-11-16 NOTE — Progress Notes (Unsigned)
[facility] [address], [address] [place], [place] [address] [phone]  Date:  11/17/2021   Name:  Phillip [name]   DOB:  December 24, 1950   MRN:  [id number]  PCP:  Pearline Cables, MD    Chief Complaint: No chief complaint on file.   History of Present Illness:  Phillip [name] is a 71 y.o. very pleasant male patient who presents with the following:  Dr. Alwyn Ren seen today for follow-up Most recent visit with myself was in January  History of diabetes, sleep apnea on CPAP, diabetic neuropathy, CAD, migraine headache, ischemic stroke in 2014 with no residual  Seen by neurology, Dr.Dohmeier in October to follow-up on sleep apnea He had been seeing Dr. Arlyn Leak with cardiology prior to his retirement  Shingrix Can recommend a COVID-19 booster  Foot exam Has been seen by urology to follow-up on PSA Lab Results  Component Value Date   HGBA1C 7.4 (H) 09/02/2021   Lantus 30 units Metformin 1000 twice daily Farxiga 10 Exenatide ER 2 mg Plavix Losartan 100 Bystolic 10 Crestor 20 Allopurinol- ?  Any recent gout symptoms, can check uric acid Patient Active Problem List   Diagnosis Date Noted   Severe obstructive sleep apnea-hypopnea syndrome 07/01/2020   Dependence on CPAP ventilation 07/01/2020   Type 2 diabetes mellitus with diabetic neuropathy, without long-term current use of insulin (HCC) 09/02/2019   OSA and COPD overlap syndrome (HCC) 09/02/2019   Abnormal hemoglobin (HCC) 09/02/2019   OSA on CPAP 08/12/2015   Ventricular tachycardia (HCC) 11/10/2014   CAD (coronary artery disease), native coronary artery    Variants of migraine, not elsewhere classified, without mention of intractable migraine without mention of status migrainosus 08/14/2013   History of ischemic stroke without residual deficits 04/17/2013   ED (erectile dysfunction) 02/01/2012   Gout 09/05/2011   Hyperlipidemia 09/05/2011   Hypertriglyceridemia  09/05/2011   PVC (premature ventricular contraction) 09/05/2011   Type 2 diabetes, controlled, with neuropathy (HCC) 06/01/2011   Essential hypertension, benign 06/01/2011    Past Medical History:  Diagnosis Date   Anemia    Arthritis    Diabetes mellitus    Gout    Hyperlipidemia    Hypertension    Localized osteoarthritis of left knee 11/20/2012   Migraine headache with aura    Neuromuscular disorder (HCC)    " mild periferal neauropathy   Osteoarthritis of left hip 11/20/2012   PVC (premature ventricular contraction)    Sleep apnea    cpap    Past Surgical History:  Procedure Laterality Date   CHOLECYSTECTOMY  1997   CVA     CVA, occiptal   CVA     CVA, occipital   knee arthoscopy  2006   Left Knee   LEFT HEART CATH AND CORONARY ANGIOGRAPHY N/A 12/07/2017   Procedure: LEFT HEART CATH AND CORONARY ANGIOGRAPHY;  Surgeon: Lennette Bihari, MD;  Location: [facility];  Service: Cardiovascular;  Laterality: N/A;   left total hip     left unicarpmental knee     LOOP RECORDER IMPLANT N/A 04/19/2013   Procedure: LOOP RECORDER IMPLANT;  Surgeon: Marinus Maw, MD;  [facility] C[facility];  Service: Cardiovascular;  Laterality: N/A;   PARTIAL KNEE ARTHROPLASTY Left 11/20/2012   Procedure: UNICOMPARTMENTAL KNEE;  Surgeon: Eulas Post, MD;  Location: [facility] OR;  Service: Orthopedics;  Laterality: Left;   REPLACEMENT UNICONDYLAR JOINT KNEE Left 11/20/2012  Dr Mardelle Matte   TEE WITHOUT CARDIOVERSION N/A 04/19/2013   Procedure: TRANSESOPHAGEAL ECHOCARDIOGRAM (TEE);  Surgeon: Larey Dresser, MD;  Location: [facility];  Service: Cardiovascular;  Laterality: N/A;   TONSILLECTOMY AND ADENOIDECTOMY     Childhood   TOTAL HIP ARTHROPLASTY Left 11/20/2012   Dr Mardelle Matte   TOTAL HIP ARTHROPLASTY Left 11/20/2012   Procedure: TOTAL HIP ARTHROPLASTY;  Surgeon: Johnny Bridge, MD;  Location: [facility];  Service: Orthopedics;  Laterality: Left;    Social History   Tobacco Use   Smoking status:  Never   Smokeless tobacco: Never  Substance Use Topics   Alcohol use: No    Alcohol/week: 0.0 standard drinks of alcohol   Drug use: No    Family History  Problem Relation Age of Onset   Cancer Mother        Breast   Stroke Mother    Stroke Maternal Grandmother    Stroke Maternal Aunt    Stroke Maternal Uncle     Allergies  Allergen Reactions   Sulfa Antibiotics Rash and Other (See Comments)    Reaction not confirmed, but a possibility (from childhood)    Medication list has been reviewed and updated.  Current Outpatient Medications on File Prior to Visit  Medication Sig Dispense Refill   acetaminophen (TYLENOL) 650 MG CR tablet Take 1,300 mg by mouth every 8 (eight) hours as needed for pain.     Alcohol Swabs PADS Use to test up to 4 times daily 100 each 2   ALEVE 220 MG tablet Take 220-440 mg by mouth 2 (two) times daily as needed (for headaches).     allopurinol (ZYLOPRIM) 300 MG tablet TAKE 1 TABLET BY MOUTH DAILY 90 tablet 3   blood glucose meter kit and supplies KIT Pt needs One Touch Ultra glucometer.  Use up to BID to check blood sugars 1 each 0   clopidogrel (PLAVIX) 75 MG tablet Take 1 tablet (75 mg total) by mouth daily. 90 tablet 3   dapagliflozin propanediol (FARXIGA) 10 MG TABS tablet Take 1 tablet (10 mg total) by mouth daily before breakfast. 90 tablet 3   diclofenac sodium (VOLTAREN) 1 % GEL Apply 2-4 g topically 2 (two) times daily as needed (for pain).     Exenatide ER 2 MG/0.85ML AUIJ INJECT 2 MG INTO THE SKIN ONCE A WEEK. 10.2 mL 3   glucose blood (FREESTYLE LITE) test strip USE TO TEST UP TO 4 TIMES DAILY 100 each 12   hydrocortisone-pramoxine (ANALPRAM HC) 2.5-1 % rectal cream Place 1 application rectally 3 (three) times daily. 30 g 0   insulin glargine (LANTUS SOLOSTAR) 100 UNIT/ML Solostar Pen Inject 30 Units into the skin daily. (Patient taking differently: Inject 30 Units into the skin at bedtime.) 15 mL PRN   Insulin Pen Needle (PEN NEEDLES) 32G X 5  MM MISC 1 each by Does not apply route daily. Use for basal insulin injection 100 each 6   losartan (COZAAR) 100 MG tablet Take 1 tablet (100 mg total) by mouth daily. 90 tablet 3   metFORMIN (GLUCOPHAGE-XR) 500 MG 24 hr tablet Take 2 tablets (1,000 mg total) by mouth 2 (two) times daily. 360 tablet 3   nebivolol (BYSTOLIC) 10 MG tablet TAKE 1 TABLET BY MOUTH EVERY EVENING. 90 tablet 3   QUICKVUE AT-HOME COVID-19 TEST KIT TEST AS DIRECTED TODAY 8 each 1   rosuvastatin (CRESTOR) 5 MG tablet TAKE 1 TABLET (5 MG TOTAL) BY MOUTH EVERY MONDAY, WEDNESDAY, AND FRIDAY.  39 tablet 2   SM LANCETS 33G MISC USE TO TEST BLOOD SUGAR TWICE DAILY 400 each PRN   zolpidem (AMBIEN) 5 MG tablet Take 1 tablet (5 mg total) by mouth at bedtime as needed for sleep. 30 tablet 1   No current facility-administered medications on file prior to visit.    Review of Systems:  As per HPI- otherwise negative.   Physical Examination: There were no vitals filed for this visit. There were no vitals filed for this visit. There is no height or weight on file to calculate BMI. Ideal Body Weight:    GEN: no acute distress. HEENT: Atraumatic, Normocephalic.  Ears and Nose: No external deformity. CV: RRR, No M/G/R. No JVD. No thrill. No extra heart sounds. PULM: CTA B, no wheezes, crackles, rhonchi. No retractions. No resp. distress. No accessory muscle use. ABD: S, NT, ND, +BS. No rebound. No HSM. EXTR: No c/c/e PSYCH: Normally interactive. Conversant.  Foot exam  Assessment and Plan: ***  Signed Lamar Blinks, MD

## 2021-11-16 NOTE — Telephone Encounter (Signed)
Caller Name Antwain Caliendo Caller Phone Number [phone] Patient Name Phillip [name] Patient DOB June 01, 1950 Call Type Message Only Information Provided Reason for Call Request to Reschedule Office Appointment Initial Comment Caller states he needs to reschedule an appointment for 11/17/2021 at 345pm. Caller just realized he has another doctor's appointment at 300pm. Could the office contact the caller? Patient request to speak to RN No Additional Comment Caller needs to cancel his appointment on Wed 11/17/2021 at 345pm and reschedule the appointment. Disp. Time Disposition Final User 11/15/2021 5:29:52 PM General Information Provided Yes [place], [employer]

## 2021-11-16 NOTE — Patient Instructions (Incomplete)
Great to see you again today, as always Assuming all is well lets plan for 6 months  Let me know if you would like -zio patch monitor -cardiology referral -CT calcium score    Let's try changing over to Ozempic from [place]- start at '1mg'$  weekly and let me know what you think.  I can increase to 2 mg in a month if you like  Try the requip 0.25- increase to 0.5 mg after a couple of days, can go up from there as needed

## 2021-11-17 ENCOUNTER — Ambulatory Visit (INDEPENDENT_AMBULATORY_CARE_PROVIDER_SITE_OTHER): Payer: Medicare Other | Admitting: Family Medicine

## 2021-11-17 VITALS — BP 132/84 | HR 71 | Temp 98.5°F | Resp 18 | Ht 72.0 in | Wt 233.0 lb

## 2021-11-17 DIAGNOSIS — I10 Essential (primary) hypertension: Secondary | ICD-10-CM | POA: Diagnosis not present

## 2021-11-17 DIAGNOSIS — L299 Pruritus, unspecified: Secondary | ICD-10-CM | POA: Diagnosis not present

## 2021-11-17 DIAGNOSIS — G2581 Restless legs syndrome: Secondary | ICD-10-CM | POA: Diagnosis not present

## 2021-11-17 DIAGNOSIS — E119 Type 2 diabetes mellitus without complications: Secondary | ICD-10-CM | POA: Diagnosis not present

## 2021-11-17 DIAGNOSIS — R002 Palpitations: Secondary | ICD-10-CM

## 2021-11-17 MED ORDER — ACETIC ACID 2 % OT SOLN: 4.0000 [drp] | Freq: Four times a day (QID) | OTIC | 1 refills | Status: DC

## 2021-11-17 MED ORDER — ROPINIROLE HCL 0.25 MG PO TABS: ORAL_TABLET | ORAL | 3 refills | Status: DC

## 2021-11-17 MED ORDER — SEMAGLUTIDE (1 MG/DOSE) 4 MG/3ML ~~LOC~~ SOPN: 1.0000 mg | PEN_INJECTOR | SUBCUTANEOUS | 3 refills | Status: DC

## 2021-11-17 NOTE — Telephone Encounter (Signed)
Fyi.

## 2021-11-22 DIAGNOSIS — R3914 Feeling of incomplete bladder emptying: Secondary | ICD-10-CM | POA: Diagnosis not present

## 2021-11-22 DIAGNOSIS — R3915 Urgency of urination: Secondary | ICD-10-CM | POA: Diagnosis not present

## 2021-11-22 DIAGNOSIS — N401 Enlarged prostate with lower urinary tract symptoms: Secondary | ICD-10-CM | POA: Diagnosis not present

## 2021-11-22 DIAGNOSIS — R3912 Poor urinary stream: Secondary | ICD-10-CM | POA: Diagnosis not present

## 2021-12-11 DIAGNOSIS — G4733 Obstructive sleep apnea (adult) (pediatric): Secondary | ICD-10-CM | POA: Diagnosis not present

## 2021-12-21 ENCOUNTER — Other Ambulatory Visit: Payer: Self-pay | Admitting: Family Medicine

## 2022-01-04 ENCOUNTER — Other Ambulatory Visit: Payer: Self-pay | Admitting: Family Medicine

## 2022-01-04 DIAGNOSIS — E119 Type 2 diabetes mellitus without complications: Secondary | ICD-10-CM

## 2022-01-20 ENCOUNTER — Encounter: Payer: Self-pay | Admitting: Family Medicine

## 2022-01-20 DIAGNOSIS — U071 COVID-19: Secondary | ICD-10-CM

## 2022-01-20 MED ORDER — NIRMATRELVIR/RITONAVIR (PAXLOVID)TABLET: 3.0000 | ORAL_TABLET | Freq: Two times a day (BID) | ORAL | 0 refills | Status: AC

## 2022-01-20 NOTE — Telephone Encounter (Signed)
Okay for Rx? Or do they need VV?

## 2022-01-25 ENCOUNTER — Encounter: Payer: Self-pay | Admitting: Family Medicine

## 2022-01-25 DIAGNOSIS — R002 Palpitations: Secondary | ICD-10-CM

## 2022-01-26 ENCOUNTER — Encounter: Payer: Self-pay | Admitting: Family Medicine

## 2022-01-26 ENCOUNTER — Ambulatory Visit: Payer: Medicare Other | Attending: Family Medicine

## 2022-01-26 DIAGNOSIS — R002 Palpitations: Secondary | ICD-10-CM

## 2022-01-26 NOTE — Progress Notes (Unsigned)
Enrolled for [employer] to mail a ZIO XT long term holter monitor to the patients address on file.   DOD to read. 

## 2022-01-28 DIAGNOSIS — M545 Low back pain, unspecified: Secondary | ICD-10-CM | POA: Diagnosis not present

## 2022-01-28 DIAGNOSIS — M7632 Iliotibial band syndrome, left leg: Secondary | ICD-10-CM | POA: Diagnosis not present

## 2022-01-28 DIAGNOSIS — M25552 Pain in left hip: Secondary | ICD-10-CM | POA: Diagnosis not present

## 2022-01-29 DIAGNOSIS — R002 Palpitations: Secondary | ICD-10-CM | POA: Diagnosis not present

## 2022-02-08 ENCOUNTER — Encounter: Payer: Self-pay | Admitting: Neurology

## 2022-02-08 ENCOUNTER — Ambulatory Visit: Payer: Medicare Other | Admitting: Neurology

## 2022-02-08 ENCOUNTER — Telehealth: Payer: Self-pay

## 2022-02-08 VITALS — BP 160/90 | HR 66 | Ht 72.0 in | Wt 231.5 lb

## 2022-02-08 DIAGNOSIS — R002 Palpitations: Secondary | ICD-10-CM | POA: Diagnosis not present

## 2022-02-08 DIAGNOSIS — E1140 Type 2 diabetes mellitus with diabetic neuropathy, unspecified: Secondary | ICD-10-CM

## 2022-02-08 DIAGNOSIS — G4733 Obstructive sleep apnea (adult) (pediatric): Secondary | ICD-10-CM | POA: Diagnosis not present

## 2022-02-08 DIAGNOSIS — Z9989 Dependence on other enabling machines and devices: Secondary | ICD-10-CM | POA: Diagnosis not present

## 2022-02-08 NOTE — Patient Instructions (Signed)
KEEPING IT CLEAN: CPAP HYGIENE PROPER UPKEEP OF YOUR CPAP MACHINE CAN HELP ENSURE THE DEVICE FUNCTIONS PROPERLY CPAP CLEANING INSTRUCTIONS Along with proper CPAP cleaning it is recommended that you replace your mask, tubing and filters once very 3 months and more frequently if you are sick.   DAILY CLEANING Do not use moisturizing soaps, bleach, scented oils, chlorine, or alcohol-based solutions to clean your supplies. These solutions may cause irritation to your skin and lungs and may reduce the life of your products. Dawn [employer] or Comparable works best for daily cleaning.  **If you've been sick, it's smart to wash your mask, tubing, humidifier and filter daily until your cold, flu or virus symptoms are gone. That can help reduce the amount of time you spend under the weather.  Before using your mask -wash your face daily with soap and water to remove excess facial oils. Wipe down your mask (including areas that come in contact with your skin) using a damp towel with soap and warm water. This will remove any oils, dead skin cells, and sweat on the mask that can affect the quality of the seal. Gently rinse with a clean towel and let the mask air-dry out of direct sunlight. You can also use unscented baby wipes or pre-moistened towels designed specifically for cleaning CPAP masks, which are available on-line. DO NOT USE CLOROX OR DISINFECTING WIPES. If your unit has a humidifier, empty any leftover water instead of letting in sit in the unit all day. Refill the humidifier with clean, distilled water right before bedtime for optimal use WEEKLY (OR MORE FREQUENT) CLEANING Your mask and tubing need a full bath at least once a week to keep it free of dust, bacteria, and germs. (During COVID-19 or any other flu/virus we recommend more frequent cleaning) Clean the CPAP tubing, nasal mask, and headgear in a bathroom sink filled with warm water and a few drops of ammonia-free, mild dish detergent. Avoid  using stronger cleaning products, as they may damage the mask or leave harmful residue. Swirl all parts around for about five minutes, rinse well and let air dry during the day. Hang the tubing over the shower rod, on a towel rack or in the laundry room to ensure all the water drips out. The mask and headgear can be air-dried on a towel or hung on a hook or hanger. You should also wipe down your CPAP machine with a damp cloth. Ensure the unit is unplugged. The towel shouldn't be too damp or wet, as water could get into the machine. Clean the filter by removing it and rinsing it in warm tap water. Run it under the water and squeeze to make sure there is no dust. Then blot down the filter with a towel. Do not wash your machine's white filter, if one is present--those are disposable and should be replaced every two weeks. If you are recovering from being sick, we recommend changing the filter sooner. If your CPAP has a humidifier, that also needs to be cleaned weekly. Empty any remaining water and then wash the water chamber in the sink with warm soapy water. Rinse well and drain out as much of the water as possible. Let the chamber air-dry before placing it back into the CPAP unit. Every other week you should disinfect the humidifier. Do that by soaking it in a solution of one-part vinegar to five parts water for 30 minutes, thoroughly rinsing and then placing in your dishwasher's top rack for washing. And keep  it clean by using only distilled water to prevent mineral deposits that can build up and cause damage to your machine. IMPORTANT TIPS Make caring for your CPAP equipment part of your morning routine. Keep machine and accessories out of direct sunlight to avoid damaging them. Never use bleach to clean accessories. Place machine on a level surface and away from curtains that may interfere with the air intake. Keep track of when you should order replacement parts for your mask and accessories so that  you always get the most out of your CPAP.With a little upkeep, your CPAP can continue to help you breathe better for a long time. Just a few minutes a day can help keep your CPAP running efficiently for years to come.
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
## 2022-02-15 DIAGNOSIS — R002 Palpitations: Secondary | ICD-10-CM | POA: Diagnosis not present

## 2022-02-16 ENCOUNTER — Ambulatory Visit: Payer: Medicare Other | Attending: Orthopedic Surgery | Admitting: Physical Therapy

## 2022-02-16 ENCOUNTER — Encounter: Payer: Self-pay | Admitting: Physical Therapy

## 2022-02-16 DIAGNOSIS — M6281 Muscle weakness (generalized): Secondary | ICD-10-CM | POA: Diagnosis not present

## 2022-02-16 DIAGNOSIS — M79605 Pain in left leg: Secondary | ICD-10-CM | POA: Diagnosis not present

## 2022-02-16 DIAGNOSIS — M5459 Other low back pain: Secondary | ICD-10-CM | POA: Insufficient documentation

## 2022-02-16 NOTE — Therapy (Signed)
OUTPATIENT PHYSICAL THERAPY THORACOLUMBAR EVALUATION   Patient Name: Phillip [name] MRN: [id number] DOB:19-Jul-1950, 71 y.o., male Today's Date: 02/16/2022   PT End of Session - 02/16/22 1111     Visit Number 1    Date for PT Re-Evaluation 05/19/22    Authorization Type BCBS medicare    PT Start Time 1100    PT Stop Time 7616    PT Time Calculation (min) 45 min             Past Medical History:  Diagnosis Date   Anemia    Arthritis    Diabetes mellitus    Gout    Hyperlipidemia    Hypertension    Localized osteoarthritis of left knee 11/20/2012   Migraine headache with aura    Neuromuscular disorder ([place])    " mild periferal neauropathy   Osteoarthritis of left hip 11/20/2012   PVC (premature ventricular contraction)    Sleep apnea    cpap   Past Surgical History:  Procedure Laterality Date   CHOLECYSTECTOMY  1997   CVA     CVA, occiptal   CVA     CVA, occipital   knee arthoscopy  2006   Left Knee   LEFT HEART CATH AND CORONARY ANGIOGRAPHY N/A 12/07/2017   Procedure: LEFT HEART CATH AND CORONARY ANGIOGRAPHY;  Surgeon: Troy Sine, MD;  Location: [facility];  Service: Cardiovascular;  Laterality: N/A;   left total hip     left unicarpmental knee     LOOP RECORDER IMPLANT N/A 04/19/2013   Procedure: LOOP RECORDER IMPLANT;  Surgeon: Evans Lance, MD;  Location: [facility];  Service: Cardiovascular;  Laterality: N/A;   PARTIAL KNEE ARTHROPLASTY Left 11/20/2012   Procedure: UNICOMPARTMENTAL KNEE;  Surgeon: Johnny Bridge, MD;  Location: [place];  Service: Orthopedics;  Laterality: Left;   REPLACEMENT UNICONDYLAR JOINT KNEE Left 11/20/2012   Dr Mardelle Matte   TEE WITHOUT CARDIOVERSION N/A 04/19/2013   Procedure: TRANSESOPHAGEAL ECHOCARDIOGRAM (TEE);  Surgeon: Larey Dresser, MD;  Location: [place];  Service: Cardiovascular;  Laterality: N/A;   TONSILLECTOMY AND ADENOIDECTOMY     Childhood   TOTAL HIP ARTHROPLASTY Left 11/20/2012   Dr Mardelle Matte   TOTAL  HIP ARTHROPLASTY Left 11/20/2012   Procedure: TOTAL HIP ARTHROPLASTY;  Surgeon: Johnny Bridge, MD;  Location: [place];  Service: Orthopedics;  Laterality: Left;   Patient Active Problem List   Diagnosis Date Noted   Severe obstructive sleep apnea-hypopnea syndrome 07/01/2020   Dependence on CPAP ventilation 07/01/2020   Type 2 diabetes mellitus with diabetic neuropathy, without long-term current use of insulin ([place]) 09/02/2019   OSA and COPD overlap syndrome ([place]) 09/02/2019   Abnormal hemoglobin ([facility]) 09/02/2019   OSA on CPAP 08/12/2015   Ventricular tachycardia ([place]) 11/10/2014   CAD (coronary artery disease), native coronary artery    Variants of migraine, not elsewhere classified, without mention of intractable migraine without mention of status migrainosus 08/14/2013   History of ischemic stroke without residual deficits 04/17/2013   ED (erectile dysfunction) 02/01/2012   Gout 09/05/2011   Hyperlipidemia 09/05/2011   Hypertriglyceridemia 09/05/2011   PVC (premature ventricular contraction) 09/05/2011   Type 2 diabetes, controlled, with neuropathy ([place]) 06/01/2011   Essential hypertension, benign 06/01/2011    PCP: [place]  REFERRING PROVIDER: Mardelle Matte  REFERRING DIAG: LBP, left leg pain and weakness  Rationale for Evaluation and Treatment Rehabilitation  THERAPY DIAG:  Other low back pain  Pain in left leg  Muscle  weakness (generalized)  ONSET DATE: August 2023  SUBJECTIVE:                                                                                                                                                                                           SUBJECTIVE STATEMENT: Reports that he had left partial TKA and left THA 8 years ago, recovered well, reports that over the past year he has had increased LBP and left leg pain and some weakness, reports reaching into the back of the oven really hurts and his leg gives way PERTINENT HISTORY:  See above  PAIN:  Are  you having pain? Yes: NPRS scale: 1/10 Pain location: left leg to the knee Pain description: pain and stiffness Aggravating factors: early morning, squatting, get up after sitting pain up to 6-7/10 Relieving factors: rest, Tylenol, lie down, can be 1/10   PRECAUTIONS: None  WEIGHT BEARING RESTRICTIONS: No  FALLS:  Has patient fallen in last 6 months? No  LIVING ENVIRONMENT: Lives with: lives with their family Lives in: House/apartment Stairs: Yes: Internal: 16 steps; can reach both Has following equipment at home: None  OCCUPATION: some work with hospice, he is an MD, does travel over seas often  PLOF: Independent and golf 1xweek, some walking up to 2 miles a few times a week  PATIENT GOALS: get up and down from the floor, less pain, stronger, get up easier   OBJECTIVE:   DIAGNOSTIC FINDINGS:  X-rays scoliosis and degenerative changes  PATIENT SURVEYS:  FOTO 55  COGNITION:  Overall cognitive status: Within functional limits for tasks assessed     SENSATION: WFL  MUSCLE LENGTH: Very tight HS 45 degrees with pain mostly left posterior knee Tight calves, mild tightness in the piriformis  POSTURE: rounded shoulders and forward head  PALPATION: Tight in the lumbar area, non tender  LUMBAR ROM:   AROM eval  Flexion Decreased 50%  Extension 50%  Right lateral flexion Decreased 75%  Left lateral flexion Decreased 75 %  Right rotation   Left rotation    (Blank rows = not tested)  LOWER EXTREMITY ROM:      LOWER EXTREMITY MMT:    MMT Right eval Left eval  Hip flexion 4+ 4-  Hip extension    Hip abduction 4+ 4-  Hip adduction    Hip internal rotation    Hip external rotation    Knee flexion    Knee extension 4+ 4-  Ankle dorsiflexion    Ankle plantarflexion    Ankle inversion    Ankle eversion     (Blank rows = not tested)  FUNCTIONAL TESTS:  5  times sit to stand: 12 with decreased control Timed up and go (TUG): 12 seconds slight off balance  with turn  GAIT: Distance walked: some ataxic LE motions, does have neuropathy Assistive device utilized: None Level of assistance: Complete Independence Comments: unsteady turns, does stairs one at a time up and down due to feeling like the left leg will not hold him    TODAY'S TREATMENT:    PATIENT EDUCATION:  Education details: HEP and POC Person educated: Patient Education method: Explanation, Demonstration, Verbal cues, and Handouts Education comprehension: verbalized understanding   HOME EXERCISE PROGRAM: Access Code: [email or web address] URL: [email or web address] Date: 02/16/2022 Prepared by: Lum Babe  Exercises - Hooklying Single Knee to Chest Stretch  - 2 x daily - 7 x weekly - 2 sets - 10 reps - 10 hold - Supine Double Knee to Chest  - 2 x daily - 7 x weekly - 2 sets - 10 reps - 10 hold - Supine Lower Trunk Rotation  - 2 x daily - 7 x weekly - 2 sets - 10 reps - 10 hold - Seated Hamstring Stretch with Chair  - 2 x daily - 7 x weekly - 2 sets - 5 reps - 15 hold  ASSESSMENT:  CLINICAL IMPRESSION: Patient is a 71 y.o. male who was seen today for physical therapy evaluation and treatment for LBP, stiffness, left leg pain and weakness, difficulty getting up from floor and out of bed. Has a diastisis recti, weak abs, difficulty getting out of bed   OBJECTIVE IMPAIRMENTS: Abnormal gait, decreased balance, decreased mobility, difficulty walking, decreased ROM, decreased strength, increased muscle spasms, impaired flexibility, improper body mechanics, and pain.   REHAB POTENTIAL: Good  CLINICAL DECISION MAKING: Stable/uncomplicated  EVALUATION COMPLEXITY: Low   GOALS: Goals reviewed with patient? Yes  SHORT TERM GOALS: Target date: 03/02/22  Independent with initial HEP Goal status: INITIAL  LONG TERM GOALS: Target date: 05/11/22  Understand posture and body mechanics Goal status: INITIAL  2.  Go up and down stairs step over step Goal status:  INITIAL  3.  Get up off the floor to play with grandkids Goal status: INITIAL  4.  Increase left LE stregnth to 4+/5 Goal status: INITIAL  5.  Squat to reach in the oven without difficulty Goal status: INITIAL   PLAN: PT FREQUENCY: 1x/week  PT DURATION: 12 weeks  PLANNED INTERVENTIONS: Therapeutic exercises, Therapeutic activity, Neuromuscular re-education, Balance training, Gait training, Patient/Family education, Self Care, Joint mobilization, Stair training, Dry Needling, Electrical stimulation, Moist heat, Taping, Traction, and Manual therapy.  PLAN FOR NEXT SESSION: start gym activity   Nidia Grogan W, PT 02/16/2022, 11:12 AM bac

## 2022-02-18 DIAGNOSIS — R002 Palpitations: Secondary | ICD-10-CM | POA: Insufficient documentation

## 2022-02-23 ENCOUNTER — Ambulatory Visit: Payer: Medicare Other | Attending: Internal Medicine | Admitting: Internal Medicine

## 2022-02-23 ENCOUNTER — Encounter: Payer: Self-pay | Admitting: Internal Medicine

## 2022-02-23 DIAGNOSIS — R002 Palpitations: Secondary | ICD-10-CM

## 2022-02-23 NOTE — Patient Instructions (Signed)
Medication Instructions:  Your physician recommends that you continue on your current medications as directed. Please refer to the Current Medication list given to you today.  *If you need a refill on your cardiac medications before your next appointment, please call your pharmacy*   Lab Work: None ordered.  If you have labs (blood work) drawn today and your tests are completely normal, you will receive your results only by: [facility] (if you have MyChart) OR A paper copy in the mail If you have any lab test that is abnormal or we need to change your treatment, we will call you to review the results.   Testing/Procedures: None ordered.    Follow-Up: At [facility], you and your health needs are our priority.  As part of our continuing mission to provide you with exceptional heart care, we have created designated Provider Care Teams.  These Care Teams include your primary Cardiologist (physician) and Advanced Practice Providers (APPs -  Physician Assistants and Nurse Practitioners) who all work together to provide you with the care you need, when you need it.  We recommend signing up for the patient portal called "MyChart".  Sign up information is provided on this After Visit Summary.  MyChart is used to connect with patients for Virtual Visits (Telemedicine).  Patients are able to view lab/test results, encounter notes, upcoming appointments, etc.  Non-urgent messages can be sent to your provider as well.   To learn more about what you can do with MyChart, go to [email or web address].    Your next appointment:   18 months with Dr Caryl Comes  Loop removal with Dr Caryl Comes - 04/29/2022 at 830am  Important Information About Sugar
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
## 2022-02-25 ENCOUNTER — Ambulatory Visit: Payer: Medicare Other | Admitting: Physical Therapy

## 2022-02-25 ENCOUNTER — Encounter: Payer: Self-pay | Admitting: Physical Therapy

## 2022-02-25 DIAGNOSIS — M6281 Muscle weakness (generalized): Secondary | ICD-10-CM | POA: Diagnosis not present

## 2022-02-25 DIAGNOSIS — M79605 Pain in left leg: Secondary | ICD-10-CM | POA: Diagnosis not present

## 2022-02-25 DIAGNOSIS — M5459 Other low back pain: Secondary | ICD-10-CM | POA: Diagnosis not present

## 2022-02-25 NOTE — Therapy (Signed)
OUTPATIENT PHYSICAL THERAPY THORACOLUMBAR EVALUATION   Patient Name: Phillip [name] MRN: [id number] DOB:[birth year], 71 y.o., male Today's Date: 02/25/2022   PT End of Session - 02/25/22 0759     Visit Number 2    Date for PT Re-Evaluation 05/19/22    PT Start Time 0800    PT Stop Time 0845    PT Time Calculation (min) 45 min    Activity Tolerance Patient tolerated treatment well    Behavior During Therapy [facility] for tasks assessed/performed             Past Medical History:  Diagnosis Date   Anemia    Arthritis    Diabetes mellitus    Gout    Hyperlipidemia    Hypertension    Localized osteoarthritis of left knee 11/20/2012   Migraine headache with aura    Neuromuscular disorder ([place])    " mild periferal neauropathy   Osteoarthritis of left hip 11/20/2012   PVC (premature ventricular contraction)    Sleep apnea    cpap   Past Surgical History:  Procedure Laterality Date   CHOLECYSTECTOMY  1997   CVA     CVA, occiptal   CVA     CVA, occipital   knee arthoscopy  2006   Left Knee   LEFT HEART CATH AND CORONARY ANGIOGRAPHY N/A 12/07/2017   Procedure: LEFT HEART CATH AND CORONARY ANGIOGRAPHY;  Surgeon: Troy Sine, MD;  Location: [place] CV LAB;  Service: Cardiovascular;  Laterality: N/A;   left total hip     left unicarpmental knee     LOOP RECORDER IMPLANT N/A 04/19/2013   Procedure: LOOP RECORDER IMPLANT;  Surgeon: Evans Lance, MD;  Location: [facility] CATH LAB;  Service: Cardiovascular;  Laterality: N/A;   PARTIAL KNEE ARTHROPLASTY Left 11/20/2012   Procedure: UNICOMPARTMENTAL KNEE;  Surgeon: Johnny Bridge, MD;  Location: [place];  Service: Orthopedics;  Laterality: Left;   REPLACEMENT UNICONDYLAR JOINT KNEE Left 11/20/2012   Dr Mardelle Matte   TEE WITHOUT CARDIOVERSION N/A 04/19/2013   Procedure: TRANSESOPHAGEAL ECHOCARDIOGRAM (TEE);  Surgeon: Larey Dresser, MD;  Location: [place];  Service: Cardiovascular;  Laterality: N/A;   TONSILLECTOMY AND ADENOIDECTOMY      Childhood   TOTAL HIP ARTHROPLASTY Left 11/20/2012   Dr Mardelle Matte   TOTAL HIP ARTHROPLASTY Left 11/20/2012   Procedure: TOTAL HIP ARTHROPLASTY;  Surgeon: Johnny Bridge, MD;  Location: [place];  Service: Orthopedics;  Laterality: Left;   Patient Active Problem List   Diagnosis Date Noted   Palpitations 02/18/2022   Severe obstructive sleep apnea-hypopnea syndrome 07/01/2020   Dependence on CPAP ventilation 07/01/2020   Type 2 diabetes mellitus with diabetic neuropathy, without long-term current use of insulin ([place]) 09/02/2019   OSA and COPD overlap syndrome ([place]) 09/02/2019   Abnormal hemoglobin ([facility]) 09/02/2019   OSA on CPAP 08/12/2015   Ventricular tachycardia ([place]) 11/10/2014   CAD (coronary artery disease), native coronary artery    Variants of migraine, not elsewhere classified, without mention of intractable migraine without mention of status migrainosus 08/14/2013   History of ischemic stroke without residual deficits 04/17/2013   ED (erectile dysfunction) 02/01/2012   Gout 09/05/2011   Hyperlipidemia 09/05/2011   Hypertriglyceridemia 09/05/2011   PVC (premature ventricular contraction) 09/05/2011   Type 2 diabetes, controlled, with neuropathy ([place]) 06/01/2011   Essential hypertension, benign 06/01/2011    PCP: [place]  REFERRING PROVIDER: Mardelle Matte  REFERRING DIAG: LBP, left leg pain and weakness  Rationale for Evaluation and Treatment  Rehabilitation  THERAPY DIAG:  Other low back pain  Pain in left leg  Muscle weakness (generalized)  ONSET DATE: August 2023  SUBJECTIVE:                                                                                                                                                                                           SUBJECTIVE STATEMENT: Could not be compliant with exercises due to straining his arm and not being able to pull is knee to chest.  PERTINENT HISTORY:  See above  PAIN:  Are you having pain? Yes: NPRS scale:  0/10 Pain location: left leg to the knee Pain description: pain and stiffness Aggravating factors: early morning, squatting, get up after sitting pain up to 6-7/10 Relieving factors: rest, Tylenol, lie down, can be 1/10    FALLS:  Has patient fallen in last 6 months? No  LIVING ENVIRONMENT: Lives with: lives with their family Lives in: House/apartment Stairs: Yes: Internal: 16 steps; can reach both Has following equipment at home: None  OCCUPATION: some work with hospice, he is an MD, does travel over seas often  PLOF: Independent and golf 1xweek, some walking up to 2 miles a few times a week  PATIENT GOALS: get up and down from the floor, less pain, stronger, get up easier   OBJECTIVE:   DIAGNOSTIC FINDINGS:  X-rays scoliosis and degenerative changes  PATIENT SURVEYS:  FOTO 55  COGNITION:  Overall cognitive status: Within functional limits for tasks assessed     SENSATION: WFL  MUSCLE LENGTH: Very tight HS 45 degrees with pain mostly left posterior knee Tight calves, mild tightness in the piriformis  POSTURE: rounded shoulders and forward head  PALPATION: Tight in the lumbar area, non tender  LUMBAR ROM:   AROM eval  Flexion Decreased 50%  Extension 50%  Right lateral flexion Decreased 75%  Left lateral flexion Decreased 75 %  Right rotation   Left rotation    (Blank rows = not tested)  LOWER EXTREMITY ROM:      LOWER EXTREMITY MMT:    MMT Right eval Left eval  Hip flexion 4+ 4-  Hip extension    Hip abduction 4+ 4-  Hip adduction    Hip internal rotation    Hip external rotation    Knee flexion    Knee extension 4+ 4-  Ankle dorsiflexion    Ankle plantarflexion    Ankle inversion    Ankle eversion     (Blank rows = not tested)  FUNCTIONAL TESTS:  5 times sit to stand: 12 with decreased control Timed up and go (TUG): 12 seconds slight off  balance with turn  GAIT: Distance walked: some ataxic LE motions, does have  neuropathy Assistive device utilized: None Level of assistance: Complete Independence Comments: unsteady turns, does stairs one at a time up and down due to feeling like the left leg will not hold him    TODAY'S TREATMENT:  02/25/22 Bike L3.5 x 6 min  S2S 2x10  6in step ups x10 each Hamstring curls 35lb 2x10 Leg Ext 10lb 2x10 Calf stretch black bar 4x15''  Leg press 40lb 2x15 Standing rows 10lb x10   PATIENT EDUCATION:  Education details: HEP and POC Person educated: Patient Education method: Consulting civil engineer, Media planner, Verbal cues, and Handouts Education comprehension: verbalized understanding   HOME EXERCISE PROGRAM: Access Code: [email or web address] URL: [email or web address] Date: 02/16/2022 Prepared by: Lum Babe  Exercises - Hooklying Single Knee to Chest Stretch  - 2 x daily - 7 x weekly - 2 sets - 10 reps - 10 hold - Supine Double Knee to Chest  - 2 x daily - 7 x weekly - 2 sets - 10 reps - 10 hold - Supine Lower Trunk Rotation  - 2 x daily - 7 x weekly - 2 sets - 10 reps - 10 hold - Seated Hamstring Stretch with Chair  - 2 x daily - 7 x weekly - 2 sets - 5 reps - 15 hold  ASSESSMENT:  CLINICAL IMPRESSION: Pt enters doing well. He continues to report difficulty getting off the floor. Session consisted of functional LE and isolation strength. Some instability present with step ups. PT reports he could feel his anterior L knee with leg press. Postural cue required with standing rows and extensions.   OBJECTIVE IMPAIRMENTS: Abnormal gait, decreased balance, decreased mobility, difficulty walking, decreased ROM, decreased strength, increased muscle spasms, impaired flexibility, improper body mechanics, and pain.   REHAB POTENTIAL: Good  CLINICAL DECISION MAKING: Stable/uncomplicated  EVALUATION COMPLEXITY: Low   GOALS: Goals reviewed with patient? Yes  SHORT TERM GOALS: Target date: 03/02/22  Independent with initial HEP Goal status:  INITIAL  LONG TERM GOALS: Target date: 05/11/22  Understand posture and body mechanics Goal status: INITIAL  2.  Go up and down stairs step over step Goal status: INITIAL  3.  Get up off the floor to play with grandkids Goal status: INITIAL  4.  Increase left LE stregnth to 4+/5 Goal status: INITIAL  5.  Squat to reach in the oven without difficulty Goal status: INITIAL   PLAN: PT FREQUENCY: 1x/week  PT DURATION: 12 weeks  PLANNED INTERVENTIONS: Therapeutic exercises, Therapeutic activity, Neuromuscular re-education, Balance training, Gait training, Patient/Family education, Self Care, Joint mobilization, Stair training, Dry Needling, Electrical stimulation, Moist heat, Taping, Traction, and Manual therapy.  PLAN FOR NEXT SESSION: start gym activity   Scot Jun, PTA 02/25/2022, 7:59 AM bac

## 2022-02-25 NOTE — Addendum Note (Signed)
Addended by: Michelle Nasuti on: 02/25/2022 08:42 AM   Modules accepted: Orders

## 2022-03-04 ENCOUNTER — Encounter: Payer: Self-pay | Admitting: Physical Therapy

## 2022-03-04 ENCOUNTER — Ambulatory Visit: Payer: Medicare Other | Attending: Orthopedic Surgery | Admitting: Physical Therapy

## 2022-03-04 DIAGNOSIS — M79605 Pain in left leg: Secondary | ICD-10-CM | POA: Insufficient documentation

## 2022-03-04 DIAGNOSIS — M6281 Muscle weakness (generalized): Secondary | ICD-10-CM | POA: Diagnosis not present

## 2022-03-04 DIAGNOSIS — M5459 Other low back pain: Secondary | ICD-10-CM | POA: Diagnosis not present

## 2022-03-04 NOTE — Therapy (Addendum)
OUTPATIENT PHYSICAL THERAPY THORACOLUMBAR EVALUATION   Patient Name: Phillip [name] MRN: [id number] DOB:Feb 10, 1951, 71 y.o., male Today's Date: 03/04/2022   PT End of Session - 03/04/22 0806     Visit Number 3    Date for PT Re-Evaluation 05/19/22    PT Start Time 0800    PT Stop Time 0840    PT Time Calculation (min) 40 min    Activity Tolerance Patient tolerated treatment well    Behavior During Therapy [facility] for tasks assessed/performed             Past Medical History:  Diagnosis Date   Anemia    Arthritis    Diabetes mellitus    Gout    Hyperlipidemia    Hypertension    Localized osteoarthritis of left knee 11/20/2012   Migraine headache with aura    Neuromuscular disorder ([place])    " mild periferal neauropathy   Osteoarthritis of left hip 11/20/2012   PVC (premature ventricular contraction)    Sleep apnea    cpap   Past Surgical History:  Procedure Laterality Date   CHOLECYSTECTOMY  1997   CVA     CVA, occiptal   CVA     CVA, occipital   knee arthoscopy  2006   Left Knee   LEFT HEART CATH AND CORONARY ANGIOGRAPHY N/A 12/07/2017   Procedure: LEFT HEART CATH AND CORONARY ANGIOGRAPHY;  Surgeon: Troy Sine, MD;  Location: [place] CV LAB;  Service: Cardiovascular;  Laterality: N/A;   left total hip     left unicarpmental knee     LOOP RECORDER IMPLANT N/A 04/19/2013   Procedure: LOOP RECORDER IMPLANT;  Surgeon: Evans Lance, MD;  Location: [facility] CATH LAB;  Service: Cardiovascular;  Laterality: N/A;   PARTIAL KNEE ARTHROPLASTY Left 11/20/2012   Procedure: UNICOMPARTMENTAL KNEE;  Surgeon: Johnny Bridge, MD;  Location: [place];  Service: Orthopedics;  Laterality: Left;   REPLACEMENT UNICONDYLAR JOINT KNEE Left 11/20/2012   Dr Mardelle Matte   TEE WITHOUT CARDIOVERSION N/A 04/19/2013   Procedure: TRANSESOPHAGEAL ECHOCARDIOGRAM (TEE);  Surgeon: Larey Dresser, MD;  Location: [place];  Service: Cardiovascular;  Laterality: N/A;   TONSILLECTOMY AND ADENOIDECTOMY      Childhood   TOTAL HIP ARTHROPLASTY Left 11/20/2012   Dr Mardelle Matte   TOTAL HIP ARTHROPLASTY Left 11/20/2012   Procedure: TOTAL HIP ARTHROPLASTY;  Surgeon: Johnny Bridge, MD;  Location: [place];  Service: Orthopedics;  Laterality: Left;   Patient Active Problem List   Diagnosis Date Noted   Palpitations 02/18/2022   Severe obstructive sleep apnea-hypopnea syndrome 07/01/2020   Dependence on CPAP ventilation 07/01/2020   Type 2 diabetes mellitus with diabetic neuropathy, without long-term current use of insulin ([place]) 09/02/2019   OSA and COPD overlap syndrome ([place]) 09/02/2019   Abnormal hemoglobin ([facility]) 09/02/2019   OSA on CPAP 08/12/2015   Ventricular tachycardia ([place]) 11/10/2014   CAD (coronary artery disease), native coronary artery    Variants of migraine, not elsewhere classified, without mention of intractable migraine without mention of status migrainosus 08/14/2013   History of ischemic stroke without residual deficits 04/17/2013   ED (erectile dysfunction) 02/01/2012   Gout 09/05/2011   Hyperlipidemia 09/05/2011   Hypertriglyceridemia 09/05/2011   PVC (premature ventricular contraction) 09/05/2011   Type 2 diabetes, controlled, with neuropathy ([place]) 06/01/2011   Essential hypertension, benign 06/01/2011    PCP: [place]  REFERRING PROVIDER: Mardelle Matte  REFERRING DIAG: LBP, left leg pain and weakness  Rationale for Evaluation and Treatment  Rehabilitation  THERAPY DIAG:  Other low back pain  Pain in left leg  Muscle weakness (generalized)  ONSET DATE: August 2023  SUBJECTIVE:                                                                                                                                                                                           SUBJECTIVE STATEMENT: Patient reports that he was sore and stiff after his last treatment, lasted a couple days. PERTINENT HISTORY:  See above  PAIN:  Are you having pain? Yes: NPRS scale: 0/10 Pain location:  left leg to the knee Pain description: pain and stiffness Aggravating factors: early morning, squatting, get up after sitting pain up to 6-7/10 Relieving factors: rest, Tylenol, lie down, can be 1/10    FALLS:  Has patient fallen in last 6 months? No  LIVING ENVIRONMENT: Lives with: lives with their family Lives in: House/apartment Stairs: Yes: Internal: 16 steps; can reach both Has following equipment at home: None  OCCUPATION: some work with hospice, he is an MD, does travel over seas often  PLOF: Independent and golf 1xweek, some walking up to 2 miles a few times a week  PATIENT GOALS: get up and down from the floor, less pain, stronger, get up easier   OBJECTIVE:   DIAGNOSTIC FINDINGS:  X-rays scoliosis and degenerative changes  PATIENT SURVEYS:  FOTO 55  COGNITION:  Overall cognitive status: Within functional limits for tasks assessed     SENSATION: WFL  MUSCLE LENGTH: Very tight HS 45 degrees with pain mostly left posterior knee Tight calves, mild tightness in the piriformis  POSTURE: rounded shoulders and forward head  PALPATION: Tight in the lumbar area, non tender  LUMBAR ROM:   AROM eval  Flexion Decreased 50%  Extension 50%  Right lateral flexion Decreased 75%  Left lateral flexion Decreased 75 %  Right rotation   Left rotation    (Blank rows = not tested)  LOWER EXTREMITY ROM:      LOWER EXTREMITY MMT:    MMT Right eval Left eval  Hip flexion 4+ 4-  Hip extension    Hip abduction 4+ 4-  Hip adduction    Hip internal rotation    Hip external rotation    Knee flexion    Knee extension 4+ 4-  Ankle dorsiflexion    Ankle plantarflexion    Ankle inversion    Ankle eversion     (Blank rows = not tested)  FUNCTIONAL TESTS:  5 times sit to stand: 12 with decreased control Timed up and go (TUG): 12 seconds slight off balance with turn  GAIT: Distance  walked: some ataxic LE motions, does have neuropathy Assistive device utilized:  None Level of assistance: Complete Independence Comments: unsteady turns, does stairs one at a time up and down due to feeling like the left leg will not hold him    TODAY'S TREATMENT:  03/04/22 NuStep L5 x 6 minutes Supine stretch-LTR, pelvic tilt Supine bridge with pelvic tilt x 10 Seated weight shifts into rotation Seated HS stretch Heel raises on step Side to side step against green tband resistance    02/25/22 Bike L3.5 x 6 min  S2S 2x10  6in step ups x10 each Hamstring curls 35lb 2x10 Leg Ext 10lb 2x10 Calf stretch black bar 4x15''  Leg press 40lb 2x15 Standing rows 10lb x10   PATIENT EDUCATION:  Education details: HEP and POC Person educated: Patient Education method: Consulting civil engineer, Media planner, Verbal cues, and Handouts Education comprehension: verbalized understanding   HOME EXERCISE PROGRAM: Access Code: [email or web address] URL: [email or web address] Date: 02/16/2022 Prepared by: Lum Babe  Exercises - Hooklying Single Knee to Chest Stretch  - 2 x daily - 7 x weekly - 2 sets - 10 reps - 10 hold - Supine Double Knee to Chest  - 2 x daily - 7 x weekly - 2 sets - 10 reps - 10 hold - Supine Lower Trunk Rotation  - 2 x daily - 7 x weekly - 2 sets - 10 reps - 10 hold - Seated Hamstring Stretch with Chair  - 2 x daily - 7 x weekly - 2 sets - 5 reps - 15 hold  ASSESSMENT:  CLINICAL IMPRESSION: Pt reports multiple problems with HEP and previous therapy session. Modified his stretching and strengthening to mobilize his spine and initiate stabilizing/strengthening as tolerated.    OBJECTIVE IMPAIRMENTS: Abnormal gait, decreased balance, decreased mobility, difficulty walking, decreased ROM, decreased strength, increased muscle spasms, impaired flexibility, improper body mechanics, and pain.   REHAB POTENTIAL: Good  CLINICAL DECISION MAKING: Stable/uncomplicated  EVALUATION COMPLEXITY: Low   GOALS: Goals reviewed with patient? Yes  SHORT TERM  GOALS: Target date: 03/02/22  Independent with initial HEP Goal status: ongoing  LONG TERM GOALS: Target date: 05/11/22  Understand posture and body mechanics Goal status: INITIAL  2.  Go up and down stairs step over step Goal status: ongoing  3.  Get up off the floor to play with grandkids Goal status: INITIAL  4.  Increase left LE stregnth to 4+/5 Goal status: INITIAL  5.  Squat to reach in the oven without difficulty Goal status: INITIAL   PLAN: PT FREQUENCY: 1x/week  PT DURATION: 12 weeks  PLANNED INTERVENTIONS: Therapeutic exercises, Therapeutic activity, Neuromuscular re-education, Balance training, Gait training, Patient/Family education, Self Care, Joint mobilization, Stair training, Dry Needling, Electrical stimulation, Moist heat, Taping, Traction, and Manual therapy.  PLAN FOR NEXT SESSION: HEP with physioball exercises as he has one at home.   Ethel Rana DPT 03/04/22 8:45 AM  03/04/2022, 8:45 AM bac

## 2022-03-08 ENCOUNTER — Encounter: Payer: Self-pay | Admitting: Physical Therapy

## 2022-03-08 ENCOUNTER — Ambulatory Visit: Payer: Medicare Other | Admitting: Physical Therapy

## 2022-03-08 DIAGNOSIS — M79605 Pain in left leg: Secondary | ICD-10-CM

## 2022-03-08 DIAGNOSIS — M5459 Other low back pain: Secondary | ICD-10-CM | POA: Diagnosis not present

## 2022-03-08 DIAGNOSIS — M6281 Muscle weakness (generalized): Secondary | ICD-10-CM | POA: Diagnosis not present

## 2022-03-08 NOTE — Therapy (Signed)
OUTPATIENT PHYSICAL THERAPY THORACOLUMBAR EVALUATION   Patient Name: Phillip [name] MRN: [id number] DOB:July 26, 1950, 71 y.o., male Today's Date: 03/08/2022   PT End of Session - 03/08/22 1534     Visit Number 4    Date for PT Re-Evaluation 05/19/22    Authorization Type BCBS medicare    PT Start Time 1530    PT Stop Time 1615    PT Time Calculation (min) 45 min    Activity Tolerance Patient tolerated treatment well    Behavior During Therapy [facility] for tasks assessed/performed             Past Medical History:  Diagnosis Date   Anemia    Arthritis    Diabetes mellitus    Gout    Hyperlipidemia    Hypertension    Localized osteoarthritis of left knee 11/20/2012   Migraine headache with aura    Neuromuscular disorder ([place])    " mild periferal neauropathy   Osteoarthritis of left hip 11/20/2012   PVC (premature ventricular contraction)    Sleep apnea    cpap   Past Surgical History:  Procedure Laterality Date   CHOLECYSTECTOMY  1997   CVA     CVA, occiptal   CVA     CVA, occipital   knee arthoscopy  2006   Left Knee   LEFT HEART CATH AND CORONARY ANGIOGRAPHY N/A 12/07/2017   Procedure: LEFT HEART CATH AND CORONARY ANGIOGRAPHY;  Surgeon: Troy Sine, MD;  Location: [place] [facility];  Service: Cardiovascular;  Laterality: N/A;   left total hip     left unicarpmental knee     LOOP RECORDER IMPLANT N/A 04/19/2013   Procedure: LOOP RECORDER IMPLANT;  Surgeon: Evans Lance, MD;  Location: [facility] [place] [facility];  Service: Cardiovascular;  Laterality: N/A;   PARTIAL KNEE ARTHROPLASTY Left 11/20/2012   Procedure: UNICOMPARTMENTAL KNEE;  Surgeon: Johnny Bridge, MD;  Location: [place];  Service: Orthopedics;  Laterality: Left;   REPLACEMENT UNICONDYLAR JOINT KNEE Left 11/20/2012   Dr Mardelle Matte   TEE WITHOUT CARDIOVERSION N/A 04/19/2013   Procedure: TRANSESOPHAGEAL ECHOCARDIOGRAM (TEE);  Surgeon: Larey Dresser, MD;  Location: [place];  Service: Cardiovascular;  Laterality: N/A;    TONSILLECTOMY AND ADENOIDECTOMY     Childhood   TOTAL HIP ARTHROPLASTY Left 11/20/2012   Dr Mardelle Matte   TOTAL HIP ARTHROPLASTY Left 11/20/2012   Procedure: TOTAL HIP ARTHROPLASTY;  Surgeon: Johnny Bridge, MD;  Location: [place];  Service: Orthopedics;  Laterality: Left;   Patient Active Problem List   Diagnosis Date Noted   Palpitations 02/18/2022   Severe obstructive sleep apnea-hypopnea syndrome 07/01/2020   Dependence on CPAP ventilation 07/01/2020   Type 2 diabetes mellitus with diabetic neuropathy, without long-term current use of insulin ([place]) 09/02/2019   OSA and COPD overlap syndrome ([place]) 09/02/2019   Abnormal hemoglobin ([facility]) 09/02/2019   OSA on CPAP 08/12/2015   Ventricular tachycardia ([place]) 11/10/2014   CAD (coronary artery disease), native coronary artery    Variants of migraine, not elsewhere classified, without mention of intractable migraine without mention of status migrainosus 08/14/2013   History of ischemic stroke without residual deficits 04/17/2013   ED (erectile dysfunction) 02/01/2012   Gout 09/05/2011   Hyperlipidemia 09/05/2011   Hypertriglyceridemia 09/05/2011   PVC (premature ventricular contraction) 09/05/2011   Type 2 diabetes, controlled, with neuropathy ([place]) 06/01/2011   Essential hypertension, benign 06/01/2011    PCP: [facility]  REFERRING PROVIDER: Mardelle Matte  REFERRING DIAG: LBP, left leg pain and  weakness  Rationale for Evaluation and Treatment Rehabilitation  THERAPY DIAG:  Other low back pain  Pain in left leg  Muscle weakness (generalized)  ONSET DATE: August 2023  SUBJECTIVE:                                                                                                                                                                                           SUBJECTIVE STATEMENT: Reports that he is sore for two days after treatment, stretches feel okay  PERTINENT HISTORY:  See above  PAIN:  Are you having pain? Yes: NPRS scale:  0/10 Pain location: left leg to the knee Pain description: pain and stiffness Aggravating factors: early morning, squatting, get up after sitting pain up to 6-7/10 Relieving factors: rest, Tylenol, lie down, can be 1/10    FALLS:  Has patient fallen in last 6 months? No  LIVING ENVIRONMENT: Lives with: lives with their family Lives in: House/apartment Stairs: Yes: Internal: 16 steps; can reach both Has following equipment at home: None  OCCUPATION: some work with hospice, he is an MD, does travel over seas often  PLOF: Independent and golf 1xweek, some walking up to 2 miles a few times a week  PATIENT GOALS: get up and down from the floor, less pain, stronger, get up easier   OBJECTIVE:   DIAGNOSTIC FINDINGS:  X-rays scoliosis and degenerative changes  PATIENT SURVEYS:  FOTO 55  COGNITION:  Overall cognitive status: Within functional limits for tasks assessed     SENSATION: WFL  MUSCLE LENGTH: Very tight HS 45 degrees with pain mostly left posterior knee Tight calves, mild tightness in the piriformis  POSTURE: rounded shoulders and forward head  PALPATION: Tight in the lumbar area, non tender  LUMBAR ROM:   AROM eval  Flexion Decreased 50%  Extension 50%  Right lateral flexion Decreased 75%  Left lateral flexion Decreased 75 %  Right rotation   Left rotation    (Blank rows = not tested)  LOWER EXTREMITY ROM:      LOWER EXTREMITY MMT:    MMT Right eval Left eval  Hip flexion 4+ 4-  Hip extension    Hip abduction 4+ 4-  Hip adduction    Hip internal rotation    Hip external rotation    Knee flexion    Knee extension 4+ 4-  Ankle dorsiflexion    Ankle plantarflexion    Ankle inversion    Ankle eversion     (Blank rows = not tested)  FUNCTIONAL TESTS:  5 times sit to stand: 12 with decreased control Timed up and go (TUG): 12 seconds slight off balance  with turn  GAIT: Distance walked: some ataxic LE motions, does have  neuropathy Assistive device utilized: None Level of assistance: Complete Independence Comments: unsteady turns, does stairs one at a time up and down due to feeling like the left leg will not hold him    TODAY'S TREATMENT:  03/08/22 Nustep level 5 x 6 minutes Calf stretch Seated row 20# 2x10 Lat pulls 20# 2x10 Passive HS and piriformis stretch Lumbar traction 75# x 10 minutes   03/04/22 NuStep L5 x 6 minutes Supine stretch-LTR, pelvic tilt Supine bridge with pelvic tilt x 10 Seated weight shifts into rotation Seated HS stretch Heel raises on step Side to side step against green tband resistance    02/25/22 Bike L3.5 x 6 min  S2S 2x10  6in step ups x10 each Hamstring curls 35lb 2x10 Leg Ext 10lb 2x10 Calf stretch black bar 4x15''  Leg press 40lb 2x15 Standing rows 10lb x10   PATIENT EDUCATION:  Education details: HEP and POC Person educated: Patient Education method: Consulting civil engineer, Media planner, Verbal cues, and Handouts Education comprehension: verbalized understanding   HOME EXERCISE PROGRAM: Access Code: [email or web address] URL: [email or web address] Date: 02/16/2022 Prepared by: Lum Babe  Exercises - Hooklying Single Knee to Chest Stretch  - 2 x daily - 7 x weekly - 2 sets - 10 reps - 10 hold - Supine Double Knee to Chest  - 2 x daily - 7 x weekly - 2 sets - 10 reps - 10 hold - Supine Lower Trunk Rotation  - 2 x daily - 7 x weekly - 2 sets - 10 reps - 10 hold - Seated Hamstring Stretch with Chair  - 2 x daily - 7 x weekly - 2 sets - 5 reps - 15 hold  ASSESSMENT:  CLINICAL IMPRESSION: Pt continues to report pain for a few days after PT, added a few different exercises and tried traction   OBJECTIVE IMPAIRMENTS: Abnormal gait, decreased balance, decreased mobility, difficulty walking, decreased ROM, decreased strength, increased muscle spasms, impaired flexibility, improper body mechanics, and pain.   REHAB POTENTIAL: Good  CLINICAL  DECISION MAKING: Stable/uncomplicated  EVALUATION COMPLEXITY: Low   GOALS: Goals reviewed with patient? Yes  SHORT TERM GOALS: Target date: 03/02/22  Independent with initial HEP Goal status: ongoing  LONG TERM GOALS: Target date: 05/11/22  Understand posture and body mechanics Goal status: INITIAL  2.  Go up and down stairs step over step Goal status: ongoing  3.  Get up off the floor to play with grandkids Goal status: INITIAL  4.  Increase left LE stregnth to 4+/5 Goal status: INITIAL  5.  Squat to reach in the oven without difficulty Goal status: INITIAL   PLAN: PT FREQUENCY: 1x/week  PT DURATION: 12 weeks  PLANNED INTERVENTIONS: Therapeutic exercises, Therapeutic activity, Neuromuscular re-education, Balance training, Gait training, Patient/Family education, Self Care, Joint mobilization, Stair training, Dry Needling, Electrical stimulation, Moist heat, Taping, Traction, and Manual therapy.  PLAN FOR NEXT SESSION: assess progress and traction   Lum Babe, PT 03/08/22 3:34 PM

## 2022-03-15 ENCOUNTER — Ambulatory Visit: Payer: Medicare Other | Admitting: Physical Therapy

## 2022-03-15 ENCOUNTER — Encounter: Payer: Self-pay | Admitting: Physical Therapy

## 2022-03-15 DIAGNOSIS — M5459 Other low back pain: Secondary | ICD-10-CM | POA: Diagnosis not present

## 2022-03-15 DIAGNOSIS — M6281 Muscle weakness (generalized): Secondary | ICD-10-CM | POA: Diagnosis not present

## 2022-03-15 DIAGNOSIS — M79605 Pain in left leg: Secondary | ICD-10-CM

## 2022-03-15 NOTE — Therapy (Signed)
OUTPATIENT PHYSICAL THERAPY THORACOLUMBAR EVALUATION   Patient Name: Phillip [name] MRN: [id number] DOB:[birth year], 71 y.o., male Today's Date: 03/15/2022   PT End of Session - 03/15/22 1700     Visit Number 5    Date for PT Re-Evaluation 05/19/22    Authorization Type BCBS medicare    PT Start Time 1700    PT Stop Time 0932    PT Time Calculation (min) 45 min    Activity Tolerance Patient tolerated treatment well    Behavior During Therapy [facility] for tasks assessed/performed             Past Medical History:  Diagnosis Date   Anemia    Arthritis    Diabetes mellitus    Gout    Hyperlipidemia    Hypertension    Localized osteoarthritis of left knee 11/20/2012   Migraine headache with aura    Neuromuscular disorder ([place])    " mild periferal neauropathy   Osteoarthritis of left hip 11/20/2012   PVC (premature ventricular contraction)    Sleep apnea    cpap   Past Surgical History:  Procedure Laterality Date   CHOLECYSTECTOMY  1997   CVA     CVA, occiptal   CVA     CVA, occipital   knee arthoscopy  2006   Left Knee   LEFT HEART CATH AND CORONARY ANGIOGRAPHY N/A 12/07/2017   Procedure: LEFT HEART CATH AND CORONARY ANGIOGRAPHY;  Surgeon: Troy Sine, MD;  Location: [place] [facility];  Service: Cardiovascular;  Laterality: N/A;   left total hip     left unicarpmental knee     LOOP RECORDER IMPLANT N/A 04/19/2013   Procedure: LOOP RECORDER IMPLANT;  Surgeon: Evans Lance, MD;  Location: [facility];  Service: Cardiovascular;  Laterality: N/A;   PARTIAL KNEE ARTHROPLASTY Left 11/20/2012   Procedure: UNICOMPARTMENTAL KNEE;  Surgeon: Johnny Bridge, MD;  Location: [place];  Service: Orthopedics;  Laterality: Left;   REPLACEMENT UNICONDYLAR JOINT KNEE Left 11/20/2012   Dr Mardelle Matte   TEE WITHOUT CARDIOVERSION N/A 04/19/2013   Procedure: TRANSESOPHAGEAL ECHOCARDIOGRAM (TEE);  Surgeon: Larey Dresser, MD;  Location: [place];  Service: Cardiovascular;  Laterality:  N/A;   TONSILLECTOMY AND ADENOIDECTOMY     Childhood   TOTAL HIP ARTHROPLASTY Left 11/20/2012   Dr Mardelle Matte   TOTAL HIP ARTHROPLASTY Left 11/20/2012   Procedure: TOTAL HIP ARTHROPLASTY;  Surgeon: Johnny Bridge, MD;  Location: [place];  Service: Orthopedics;  Laterality: Left;   Patient Active Problem List   Diagnosis Date Noted   Palpitations 02/18/2022   Severe obstructive sleep apnea-hypopnea syndrome 07/01/2020   Dependence on CPAP ventilation 07/01/2020   Type 2 diabetes mellitus with diabetic neuropathy, without long-term current use of insulin ([place]) 09/02/2019   OSA and COPD overlap syndrome ([place]) 09/02/2019   Abnormal hemoglobin ([facility]) 09/02/2019   OSA on CPAP 08/12/2015   Ventricular tachycardia ([place]) 11/10/2014   CAD (coronary artery disease), native coronary artery    Variants of migraine, not elsewhere classified, without mention of intractable migraine without mention of status migrainosus 08/14/2013   History of ischemic stroke without residual deficits 04/17/2013   ED (erectile dysfunction) 02/01/2012   Gout 09/05/2011   Hyperlipidemia 09/05/2011   Hypertriglyceridemia 09/05/2011   PVC (premature ventricular contraction) 09/05/2011   Type 2 diabetes, controlled, with neuropathy ([place]) 06/01/2011   Essential hypertension, benign 06/01/2011    PCP: [facility]  REFERRING PROVIDER: Mardelle Matte  REFERRING DIAG: LBP, left leg pain and  weakness  Rationale for Evaluation and Treatment Rehabilitation  THERAPY DIAG:  Other low back pain  Pain in left leg  Muscle weakness (generalized)  ONSET DATE: August 2023  SUBJECTIVE:                                                                                                                                                                                           SUBJECTIVE STATEMENT: Patient played golf today a little sore but overall feels like he is doing better PERTINENT HISTORY:  See above  PAIN:  Are you having pain? Yes:  NPRS scale: 2/10 Pain location: left leg to the knee Pain description: pain and stiffness Aggravating factors: early morning, squatting, get up after sitting pain up to 6-7/10 Relieving factors: rest, Tylenol, lie down, can be 1/10    FALLS:  Has patient fallen in last 6 months? No  LIVING ENVIRONMENT: Lives with: lives with their family Lives in: House/apartment Stairs: Yes: Internal: 16 steps; can reach both Has following equipment at home: None  OCCUPATION: some work with hospice, he is an MD, does travel over seas often  PLOF: Independent and golf 1xweek, some walking up to 2 miles a few times a week  PATIENT GOALS: get up and down from the floor, less pain, stronger, get up easier   OBJECTIVE:   DIAGNOSTIC FINDINGS:  X-rays scoliosis and degenerative changes  PATIENT SURVEYS:  FOTO 55  COGNITION:  Overall cognitive status: Within functional limits for tasks assessed     SENSATION: WFL  MUSCLE LENGTH: Very tight HS 45 degrees with pain mostly left posterior knee Tight calves, mild tightness in the piriformis  POSTURE: rounded shoulders and forward head  PALPATION: Tight in the lumbar area, non tender  LUMBAR ROM:   AROM eval  Flexion Decreased 50%  Extension 50%  Right lateral flexion Decreased 75%  Left lateral flexion Decreased 75 %  Right rotation   Left rotation    (Blank rows = not tested)  LOWER EXTREMITY ROM:      LOWER EXTREMITY MMT:    MMT Right eval Left eval  Hip flexion 4+ 4-  Hip extension    Hip abduction 4+ 4-  Hip adduction    Hip internal rotation    Hip external rotation    Knee flexion    Knee extension 4+ 4-  Ankle dorsiflexion    Ankle plantarflexion    Ankle inversion    Ankle eversion     (Blank rows = not tested)  FUNCTIONAL TESTS:  5 times sit to stand: 12 with decreased control Timed up and go (TUG): 12 seconds slight off  balance with turn  GAIT: Distance walked: some ataxic LE motions, does have  neuropathy Assistive device utilized: None Level of assistance: Complete Independence Comments: unsteady turns, does stairs one at a time up and down due to feeling like the left leg will not hold him    TODAY'S TREATMENT:  03/15/22 Calf stretch Seated rows 35# Lats 25# AR press 10# Straight arm pulls 10# with core activation Feet on ball K2C, trunk rotation, bridge and isometric abs Traction 82# lumbar static  03/08/22 Nustep level 5 x 6 minutes Calf stretch Seated row 20# 2x10 Lat pulls 20# 2x10 Passive HS and piriformis stretch Lumbar traction 75# x 10 minutes   03/04/22 NuStep L5 x 6 minutes Supine stretch-LTR, pelvic tilt Supine bridge with pelvic tilt x 10 Seated weight shifts into rotation Seated HS stretch Heel raises on step Side to side step against green tband resistance    02/25/22 Bike L3.5 x 6 min  S2S 2x10  6in step ups x10 each Hamstring curls 35lb 2x10 Leg Ext 10lb 2x10 Calf stretch black bar 4x15''  Leg press 40lb 2x15 Standing rows 10lb x10   PATIENT EDUCATION:  Education details: HEP and POC Person educated: Patient Education method: Consulting civil engineer, Media planner, Verbal cues, and Handouts Education comprehension: verbalized understanding   HOME EXERCISE PROGRAM: Access Code: [email or web address] URL: [email or web address] Date: 02/16/2022 Prepared by: Lum Babe  Exercises - Hooklying Single Knee to Chest Stretch  - 2 x daily - 7 x weekly - 2 sets - 10 reps - 10 hold - Supine Double Knee to Chest  - 2 x daily - 7 x weekly - 2 sets - 10 reps - 10 hold - Supine Lower Trunk Rotation  - 2 x daily - 7 x weekly - 2 sets - 10 reps - 10 hold - Seated Hamstring Stretch with Chair  - 2 x daily - 7 x weekly - 2 sets - 5 reps - 15 hold  ASSESSMENT:  CLINICAL IMPRESSION: Reports that he is not having as much pain as previous, still at times will have pain that lingers for an hour to 4 hours.  Thinks that the traction is  helping.  OBJECTIVE IMPAIRMENTS: Abnormal gait, decreased balance, decreased mobility, difficulty walking, decreased ROM, decreased strength, increased muscle spasms, impaired flexibility, improper body mechanics, and pain.   REHAB POTENTIAL: Good  CLINICAL DECISION MAKING: Stable/uncomplicated  EVALUATION COMPLEXITY: Low   GOALS: Goals reviewed with patient? Yes  SHORT TERM GOALS: Target date: 03/02/22  Independent with initial HEP Goal status: met   LONG TERM GOALS: Target date: 05/11/22  Understand posture and body mechanics Goal status: INITIAL  2.  Go up and down stairs step over step Goal status: ongoing  3.  Get up off the floor to play with grandkids Goal status: INITIAL  4.  Increase left LE stregnth to 4+/5 Goal status: INITIAL  5.  Squat to reach in the oven without difficulty Goal status: INITIAL   PLAN: PT FREQUENCY: 1x/week  PT DURATION: 12 weeks  PLANNED INTERVENTIONS: Therapeutic exercises, Therapeutic activity, Neuromuscular re-education, Balance training, Gait training, Patient/Family education, Self Care, Joint mobilization, Stair training, Dry Needling, Electrical stimulation, Moist heat, Taping, Traction, and Manual therapy.  PLAN FOR NEXT SESSION: assess progress and traction   Lum Babe, PT 03/15/22 5:01 PM

## 2022-03-16 ENCOUNTER — Encounter: Payer: Self-pay | Admitting: Internal Medicine

## 2022-03-16 NOTE — Progress Notes (Signed)
-  Looking through patient's medications, did not see any medications that cannot be taken with ED meds. Would caution use based on blood pressure. Can cause additional blood pressure reductions and can lead to dizziness and lightheadedness. There is also a degree of cardiac risk associated with sexual activity and provider should consider lowest effective dose possible.  Thanks!  Sandford Craze, PharmD. [facility] Acute Care PGY-1 03/16/2022 2:46 PM

## 2022-03-20 ENCOUNTER — Other Ambulatory Visit: Payer: Self-pay | Admitting: Family Medicine

## 2022-03-20 DIAGNOSIS — G2581 Restless legs syndrome: Secondary | ICD-10-CM

## 2022-03-20 DIAGNOSIS — E119 Type 2 diabetes mellitus without complications: Secondary | ICD-10-CM

## 2022-03-21 DIAGNOSIS — R3912 Poor urinary stream: Secondary | ICD-10-CM | POA: Diagnosis not present

## 2022-03-21 DIAGNOSIS — R3914 Feeling of incomplete bladder emptying: Secondary | ICD-10-CM | POA: Diagnosis not present

## 2022-03-21 DIAGNOSIS — N401 Enlarged prostate with lower urinary tract symptoms: Secondary | ICD-10-CM | POA: Diagnosis not present

## 2022-03-21 DIAGNOSIS — N5201 Erectile dysfunction due to arterial insufficiency: Secondary | ICD-10-CM | POA: Diagnosis not present

## 2022-03-22 ENCOUNTER — Ambulatory Visit: Payer: Medicare Other | Admitting: Physical Therapy

## 2022-03-22 DIAGNOSIS — M6281 Muscle weakness (generalized): Secondary | ICD-10-CM

## 2022-03-22 DIAGNOSIS — M5459 Other low back pain: Secondary | ICD-10-CM

## 2022-03-22 DIAGNOSIS — M79605 Pain in left leg: Secondary | ICD-10-CM | POA: Diagnosis not present

## 2022-03-22 NOTE — Therapy (Signed)
OUTPATIENT PHYSICAL THERAPY THORACOLUMBAR EVALUATION   Patient Name: Phillip [name] MRN: [id number] DOB:10/14/1950, 71 y.o., male Today's Date: 03/22/2022   PT End of Session - 03/22/22 0806     Visit Number 6    Date for PT Re-Evaluation 05/19/22    PT Start Time 0800    PT Stop Time 0840    PT Time Calculation (min) 40 min    Activity Tolerance Patient tolerated treatment well    Behavior During Therapy WFL for tasks assessed/performed              Past Medical History:  Diagnosis Date   Anemia    Arthritis    Diabetes mellitus    Gout    Hyperlipidemia    Hypertension    Localized osteoarthritis of left knee 11/20/2012   Migraine headache with aura    Neuromuscular disorder (HCC)    " mild periferal neauropathy   Osteoarthritis of left hip 11/20/2012   PVC (premature ventricular contraction)    Sleep apnea    cpap   Past Surgical History:  Procedure Laterality Date   CHOLECYSTECTOMY  1997   CVA     CVA, occiptal   CVA     CVA, occipital   knee arthoscopy  2006   Left Knee   LEFT HEART CATH AND CORONARY ANGIOGRAPHY N/A 12/07/2017   Procedure: LEFT HEART CATH AND CORONARY ANGIOGRAPHY;  Surgeon: Kelly, Thomas A, MD;  Location: [facility];  Service: Cardiovascular;  Laterality: N/A;   left total hip     left unicarpmental knee     LOOP RECORDER IMPLANT N/A 04/19/2013   Procedure: LOOP RECORDER IMPLANT;  Surgeon: Gregg W Taylor, MD;  Location: [facility];  Service: Cardiovascular;  Laterality: N/A;   PARTIAL KNEE ARTHROPLASTY Left 11/20/2012   Procedure: UNICOMPARTMENTAL KNEE;  Surgeon: Joshua P Landau, MD;  Location: [facility];  Service: Orthopedics;  Laterality: Left;   REPLACEMENT UNICONDYLAR JOINT KNEE Left 11/20/2012   Dr Landau   TEE WITHOUT CARDIOVERSION N/A 04/19/2013   Procedure: TRANSESOPHAGEAL ECHOCARDIOGRAM (TEE);  Surgeon: Dalton S McLean, MD;  Location: [facility];  Service: Cardiovascular;  Laterality: N/A;   TONSILLECTOMY AND  ADENOIDECTOMY     Childhood   TOTAL HIP ARTHROPLASTY Left 11/20/2012   Dr Landau   TOTAL HIP ARTHROPLASTY Left 11/20/2012   Procedure: TOTAL HIP ARTHROPLASTY;  Surgeon: Joshua P Landau, MD;  Location: [facility];  Service: Orthopedics;  Laterality: Left;   Patient Active Problem List   Diagnosis Date Noted   Palpitations 02/18/2022   Severe obstructive sleep apnea-hypopnea syndrome 07/01/2020   Dependence on CPAP ventilation 07/01/2020   Type 2 diabetes mellitus with diabetic neuropathy, without long-term current use of insulin (HCC) 09/02/2019   OSA and COPD overlap syndrome (HCC) 09/02/2019   Abnormal hemoglobin (HCC) 09/02/2019   OSA on CPAP 08/12/2015   Ventricular tachycardia (HCC) 11/10/2014   CAD (coronary artery disease), native coronary artery    Variants of migraine, not elsewhere classified, without mention of intractable migraine without mention of status migrainosus 08/14/2013   History of ischemic stroke without residual deficits 04/17/2013   ED (erectile dysfunction) 02/01/2012   Gout 09/05/2011   Hyperlipidemia 09/05/2011   Hypertriglyceridemia 09/05/2011   PVC (premature ventricular contraction) 09/05/2011   Type 2 diabetes, controlled, with neuropathy (HCC) 06/01/2011   Essential hypertension, benign 06/01/2011    PCP: [facility]  REFERRING PROVIDER: Landau  REFERRING DIAG: LBP, left leg pain and weakness  Rationale for Evaluation and   Treatment Rehabilitation  THERAPY DIAG:  Other low back pain  Pain in left leg  Muscle weakness (generalized)  ONSET DATE: August 2023  SUBJECTIVE:                                                                                                                                                                                           SUBJECTIVE STATEMENT: Patient reports that he feels the traction is helping. He is trying to more consistently walk. He has been able to go about 4 miles, but has had to slow down. His balance is not  as good as it used to be. His L knee aches at times, L hip, hurts less than knee. His back hurts if he leans over. He cannot hold a squat position. His L medial calf shows atrphy PERTINENT HISTORY:  See above  PAIN:  Are you having pain? Yes: NPRS scale: 2/10 Pain location: left leg to the knee Pain description: pain and stiffness Aggravating factors: early morning, squatting, get up after sitting pain up to 6-7/10 Relieving factors: rest, Tylenol, lie down, can be 1/10    FALLS:  Has patient fallen in last 6 months? No  LIVING ENVIRONMENT: Lives with: lives with their family Lives in: House/apartment Stairs: Yes: Internal: 16 steps; can reach both Has following equipment at home: None  OCCUPATION: some work with hospice, he is an MD, does travel over seas often  PLOF: Independent and golf 1xweek, some walking up to 2 miles a few times a week  PATIENT GOALS: get up and down from the floor, less pain, stronger, get up easier   OBJECTIVE:   DIAGNOSTIC FINDINGS:  X-rays scoliosis and degenerative changes  PATIENT SURVEYS:  FOTO 55  COGNITION:  Overall cognitive status: Within functional limits for tasks assessed     SENSATION: WFL  MUSCLE LENGTH: Very tight HS 45 degrees with pain mostly left posterior knee Tight calves, mild tightness in the piriformis  POSTURE: rounded shoulders and forward head  PALPATION: Tight in the lumbar area, non tender  LUMBAR ROM:   AROM eval  Flexion Decreased 50%  Extension 50%  Right lateral flexion Decreased 75%  Left lateral flexion Decreased 75 %  Right rotation   Left rotation    (Blank rows = not tested)  LOWER EXTREMITY ROM:      LOWER EXTREMITY MMT:    MMT Right eval Left eval  Hip flexion 4+ 4-  Hip extension    Hip abduction 4+ 4-  Hip adduction    Hip internal rotation    Hip external rotation    Knee flexion    Knee  extension 4+ 4-  Ankle dorsiflexion    Ankle plantarflexion    Ankle inversion     Ankle eversion     (Blank rows = not tested)  FUNCTIONAL TESTS:  5 times sit to stand: 12 with decreased control Timed up and go (TUG): 12 seconds slight off balance with turn  GAIT: Distance walked: some ataxic LE motions, does have neuropathy Assistive device utilized: None Level of assistance: Complete Independence Comments: unsteady turns, does stairs one at a time up and down due to feeling like the left leg will not hold him    TODAY'S TREATMENT:  03/22/22 Bike L4 x 6 minutes Assessed B calf strength. L medial calf with weak area. Heel raises on step and then on floor as patient does not have a good step at home, B and U on floor. Sit to stand with G tband resistance at knees and 4# weight in BUE to engage trunk, 10 reps Repeated while standing on Airex pad x 10 with OHP B side stepping with G tband resistance, 5 x 4 steps each direction. Traction-lumbar 90#, x 15 minutes   03/15/22 Calf stretch Seated rows 35# Lats 25# AR press 10# Straight arm pulls 10# with core activation Feet on ball K2C, trunk rotation, bridge and isometric abs Traction 82# lumbar static  03/08/22 Nustep level 5 x 6 minutes Calf stretch Seated row 20# 2x10 Lat pulls 20# 2x10 Passive HS and piriformis stretch Lumbar traction 75# x 10 minutes   03/04/22 NuStep L5 x 6 minutes Supine stretch-LTR, pelvic tilt Supine bridge with pelvic tilt x 10 Seated weight shifts into rotation Seated HS stretch Heel raises on step Side to side step against green tband resistance    02/25/22 Bike L3.5 x 6 min  S2S 2x10  6in step ups x10 each Hamstring curls 35lb 2x10 Leg Ext 10lb 2x10 Calf stretch black bar 4x15''  Leg press 40lb 2x15 Standing rows 10lb x10   PATIENT EDUCATION:  Education details: HEP and POC Person educated: Patient Education method: Explanation, Demonstration, Verbal cues, and Handouts Education comprehension: verbalized understanding   HOME EXERCISE PROGRAM: Access  Code: [email or web address] URL: [email or web address] Date: 02/16/2022 Prepared by: Michael Albright  Exercises - Hooklying Single Knee to Chest Stretch  - 2 x daily - 7 x weekly - 2 sets - 10 reps - 10 hold - Supine Double Knee to Chest  - 2 x daily - 7 x weekly - 2 sets - 10 reps - 10 hold - Supine Lower Trunk Rotation  - 2 x daily - 7 x weekly - 2 sets - 10 reps - 10 hold - Seated Hamstring Stretch with Chair  - 2 x daily - 7 x weekly - 2 sets - 5 reps - 15 hold  ASSESSMENT:  CLINICAL IMPRESSION: Reports that he is not having as much pain as previous, still at times will have pain that lingers for an hour to 4 hours.  Thinks that the traction is helping.  OBJECTIVE IMPAIRMENTS: Abnormal gait, decreased balance, decreased mobility, difficulty walking, decreased ROM, decreased strength, increased muscle spasms, impaired flexibility, improper body mechanics, and pain.   REHAB POTENTIAL: Good  CLINICAL DECISION MAKING: Stable/uncomplicated  EVALUATION COMPLEXITY: Low   GOALS: Goals reviewed with patient? Yes  SHORT TERM GOALS: Target date: 03/02/22  Independent with initial HEP Goal status: met   LONG TERM GOALS: Target date: 05/11/22  Understand posture and body mechanics Goal status: INITIAL  2.  Go up and down stairs step over step   Goal status: ongoing  3.  Get up off the floor to play with grandkids Goal status: INITIAL  4.  Increase left LE stregnth to 4+/5 Goal status: INITIAL  5.  Squat to reach in the oven without difficulty Goal status: INITIAL   PLAN: PT FREQUENCY: 1x/week  PT DURATION: 12 weeks  PLANNED INTERVENTIONS: Therapeutic exercises, Therapeutic activity, Neuromuscular re-education, Balance training, Gait training, Patient/Family education, Self Care, Joint mobilization, Stair training, Dry Needling, Electrical stimulation, Moist heat, Taping, Traction, and Manual therapy.  PLAN FOR NEXT SESSION: assess progress and traction   Susan Varga  DPT 03/22/22 8:42 AM  03/22/22 8:42 AM  

## 2022-03-29 ENCOUNTER — Ambulatory Visit: Payer: Medicare Other | Admitting: Physical Therapy

## 2022-03-29 ENCOUNTER — Encounter: Payer: Self-pay | Admitting: Physical Therapy

## 2022-03-29 DIAGNOSIS — M6281 Muscle weakness (generalized): Secondary | ICD-10-CM

## 2022-03-29 DIAGNOSIS — M5459 Other low back pain: Secondary | ICD-10-CM

## 2022-03-29 DIAGNOSIS — M79605 Pain in left leg: Secondary | ICD-10-CM | POA: Diagnosis not present

## 2022-03-29 NOTE — Therapy (Signed)
OUTPATIENT PHYSICAL THERAPY THORACOLUMBAR EVALUATION   Patient Name: Phillip [name] MRN: [id number] DOB:08/27/1950, 71 y.o., male Today's Date: 03/29/2022   PT End of Session - 03/29/22 0851     Visit Number 7    Date for PT Re-Evaluation 05/19/22    PT Start Time 0845    PT Stop Time 0925    PT Time Calculation (min) 40 min    Activity Tolerance Patient tolerated treatment well    Behavior During Therapy [facility] for tasks assessed/performed              Past Medical History:  Diagnosis Date   Anemia    Arthritis    Diabetes mellitus    Gout    Hyperlipidemia    Hypertension    Localized osteoarthritis of left knee 11/20/2012   Migraine headache with aura    Neuromuscular disorder ([place])    " mild periferal neauropathy   Osteoarthritis of left hip 11/20/2012   PVC (premature ventricular contraction)    Sleep apnea    cpap   Past Surgical History:  Procedure Laterality Date   CHOLECYSTECTOMY  1997   CVA     CVA, occiptal   CVA     CVA, occipital   knee arthoscopy  2006   Left Knee   LEFT HEART CATH AND CORONARY ANGIOGRAPHY N/A 12/07/2017   Procedure: LEFT HEART CATH AND CORONARY ANGIOGRAPHY;  Surgeon: Troy Sine, MD;  Location: [place] [facility];  Service: Cardiovascular;  Laterality: N/A;   left total hip     left unicarpmental knee     LOOP RECORDER IMPLANT N/A 04/19/2013   Procedure: LOOP RECORDER IMPLANT;  Surgeon: Evans Lance, MD;  Location: [facility]ATH [facility];  Service: Cardiovascular;  Laterality: N/A;   PARTIAL KNEE ARTHROPLASTY Left 11/20/2012   Procedure: UNICOMPARTMENTAL KNEE;  Surgeon: Johnny Bridge, MD;  Location: [place];  Service: Orthopedics;  Laterality: Left;   REPLACEMENT UNICONDYLAR JOINT KNEE Left 11/20/2012   Dr Mardelle Matte   TEE WITHOUT CARDIOVERSION N/A 04/19/2013   Procedure: TRANSESOPHAGEAL ECHOCARDIOGRAM (TEE);  Surgeon: Larey Dresser, MD;  Location: [place];  Service: Cardiovascular;  Laterality: N/A;   TONSILLECTOMY AND  ADENOIDECTOMY     Childhood   TOTAL HIP ARTHROPLASTY Left 11/20/2012   Dr Mardelle Matte   TOTAL HIP ARTHROPLASTY Left 11/20/2012   Procedure: TOTAL HIP ARTHROPLASTY;  Surgeon: Johnny Bridge, MD;  Location: [place];  Service: Orthopedics;  Laterality: Left;   Patient Active Problem List   Diagnosis Date Noted   Palpitations 02/18/2022   Severe obstructive sleep apnea-hypopnea syndrome 07/01/2020   Dependence on CPAP ventilation 07/01/2020   Type 2 diabetes mellitus with diabetic neuropathy, without long-term current use of insulin ([place]) 09/02/2019   OSA and COPD overlap syndrome ([place]) 09/02/2019   Abnormal hemoglobin (HCC) 09/02/2019   OSA on CPAP 08/12/2015   Ventricular tachycardia ([place]) 11/10/2014   CAD (coronary artery disease), native coronary artery    Variants of migraine, not elsewhere classified, without mention of intractable migraine without mention of status migrainosus 08/14/2013   History of ischemic stroke without residual deficits 04/17/2013   ED (erectile dysfunction) 02/01/2012   Gout 09/05/2011   Hyperlipidemia 09/05/2011   Hypertriglyceridemia 09/05/2011   PVC (premature ventricular contraction) 09/05/2011   Type 2 diabetes, controlled, with neuropathy ([place]) 06/01/2011   Essential hypertension, benign 06/01/2011    PCP: [facility]  REFERRING PROVIDER: Mardelle Matte  REFERRING DIAG: LBP, left leg pain and weakness  Rationale for Evaluation and  Treatment Rehabilitation  THERAPY DIAG:  Other low back pain  Pain in left leg  Muscle weakness (generalized)  ONSET DATE: August 2023  SUBJECTIVE:                                                                                                                                                                                           SUBJECTIVE STATEMENT: Patient reports that he has been achy in multiple joints. L hip, B knees, back. He does not always feel safe, does not always trust his L knee going down steps. He does not feel  the weather has affected him.  PERTINENT HISTORY:  See above  PAIN:  Are you having pain? Yes: NPRS scale: 2/10 Pain location: left leg to the knee Pain description: pain and stiffness Aggravating factors: early morning, squatting, get up after sitting pain up to 6-7/10 Relieving factors: rest, Tylenol, lie down, can be 1/10    FALLS:  Has patient fallen in last 6 months? No  LIVING ENVIRONMENT: Lives with: lives with their family Lives in: House/apartment Stairs: Yes: Internal: 16 steps; can reach both Has following equipment at home: None  OCCUPATION: some work with hospice, he is an MD, does travel over seas often  PLOF: Independent and golf 1xweek, some walking up to 2 miles a few times a week  PATIENT GOALS: get up and down from the floor, less pain, stronger, get up easier   OBJECTIVE:   DIAGNOSTIC FINDINGS:  X-rays scoliosis and degenerative changes  PATIENT SURVEYS:  FOTO 55  COGNITION:  Overall cognitive status: Within functional limits for tasks assessed     SENSATION: WFL  MUSCLE LENGTH: Very tight HS 45 degrees with pain mostly left posterior knee Tight calves, mild tightness in the piriformis  POSTURE: rounded shoulders and forward head  PALPATION: Tight in the lumbar area, non tender  LUMBAR ROM:   AROM eval  Flexion Decreased 50%  Extension 50%  Right lateral flexion Decreased 75%  Left lateral flexion Decreased 75 %  Right rotation   Left rotation    (Blank rows = not tested)  LOWER EXTREMITY ROM:      LOWER EXTREMITY MMT:    MMT Right eval Left eval  Hip flexion 4+ 4-  Hip extension    Hip abduction 4+ 4-  Hip adduction    Hip internal rotation    Hip external rotation    Knee flexion    Knee extension 4+ 4-  Ankle dorsiflexion    Ankle plantarflexion    Ankle inversion    Ankle eversion     (Blank rows = not tested)  FUNCTIONAL  TESTS:  5 times sit to stand: 12 with decreased control Timed up and go (TUG): 12  seconds slight off balance with turn  GAIT: Distance walked: some ataxic LE motions, does have neuropathy Assistive device utilized: None Level of assistance: Complete Independence Comments: unsteady turns, does stairs one at a time up and down due to feeling like the left leg will not hold him    TODAY'S TREATMENT:  03/29/22 Bike L3.5 x 6 minutes Lumbar stretch with physioball, forward, L, R. Lower trunk mobilization in sit- patient required mod TC and VC to isolate lower trunk. Seated rotational weight shifts, emphasizing lower trunk mobility. Mini squats over mat- 5 reps Lumbar traction 100# x 12 minutes.  03/22/22 Bike L4 x 6 minutes Assessed B calf strength. L medial calf with weak area. Heel raises on step and then on floor as patient does not have a good step at home, B and U on floor. Sit to stand with G tband resistance at knees and 4# weight in BUE to engage trunk, 10 reps Repeated while standing on Airex pad x 10 with OHP B side stepping with G tband resistance, 5 x 4 steps each direction. Traction-lumbar 90#, x 15 minutes   03/15/22 Calf stretch Seated rows 35# Lats 25# AR press 10# Straight arm pulls 10# with core activation Feet on ball K2C, trunk rotation, bridge and isometric abs Traction 82# lumbar static  03/08/22 Nustep level 5 x 6 minutes Calf stretch Seated row 20# 2x10 Lat pulls 20# 2x10 Passive HS and piriformis stretch Lumbar traction 75# x 10 minutes   03/04/22 NuStep L5 x 6 minutes Supine stretch-LTR, pelvic tilt Supine bridge with pelvic tilt x 10 Seated weight shifts into rotation Seated HS stretch Heel raises on step Side to side step against green tband resistance    02/25/22 Bike L3.5 x 6 min  S2S 2x10  6in step ups x10 each Hamstring curls 35lb 2x10 Leg Ext 10lb 2x10 Calf stretch black bar 4x15''  Leg press 40lb 2x15 Standing rows 10lb x10   PATIENT EDUCATION:  Education details: HEP and POC Person educated:  Patient Education method: Consulting civil engineer, Media planner, Verbal cues, and Handouts Education comprehension: verbalized understanding   HOME EXERCISE PROGRAM: Access Code: [email or web address] URL: [email or web address] Date: 02/16/2022 Prepared by: Lum Babe  ASSESSMENT:  CLINICAL IMPRESSION: Reports multiple joints in pain at various times, no real pattern, but his L knee feels unstable at times. Updated HEP to include additional low back stretch, and also introducing some additional strengthening.  OBJECTIVE IMPAIRMENTS: Abnormal gait, decreased balance, decreased mobility, difficulty walking, decreased ROM, decreased strength, increased muscle spasms, impaired flexibility, improper body mechanics, and pain.   REHAB POTENTIAL: Good  CLINICAL DECISION MAKING: Stable/uncomplicated  EVALUATION COMPLEXITY: Low   GOALS: Goals reviewed with patient? Yes  SHORT TERM GOALS: Target date: 03/02/22  Independent with initial HEP Goal status: met   LONG TERM GOALS: Target date: 05/11/22  Understand posture and body mechanics Goal status: INITIAL  2.  Go up and down stairs step over step Goal status: ongoing  3.  Get up off the floor to play with grandkids Goal status: INITIAL  4.  Increase left LE stregnth to 4+/5 Goal status: INITIAL  5.  Squat to reach in the oven without difficulty Goal status: ongoing.   PLAN: PT FREQUENCY: 1x/week  PT DURATION: 12 weeks  PLANNED INTERVENTIONS: Therapeutic exercises, Therapeutic activity, Neuromuscular re-education, Balance training, Gait training, Patient/Family education, Self Care, Joint mobilization, Stair training, Dry Needling, Electrical stimulation,  Moist heat, Taping, Traction, and Manual therapy.  PLAN FOR NEXT SESSION: Update HEP with additional strengthening. He wants to practice Standing<> floor transfers.   Ethel Rana DPT 03/29/22 9:24 AM  03/29/22 9:24 AM

## 2022-04-05 ENCOUNTER — Ambulatory Visit: Payer: Medicare Other | Admitting: Physical Therapy

## 2022-04-06 ENCOUNTER — Encounter: Payer: Self-pay | Admitting: Physical Therapy

## 2022-04-06 ENCOUNTER — Ambulatory Visit: Payer: Medicare Other | Attending: Orthopedic Surgery | Admitting: Physical Therapy

## 2022-04-06 DIAGNOSIS — M5459 Other low back pain: Secondary | ICD-10-CM | POA: Diagnosis not present

## 2022-04-06 DIAGNOSIS — M6281 Muscle weakness (generalized): Secondary | ICD-10-CM | POA: Diagnosis not present

## 2022-04-06 DIAGNOSIS — M79605 Pain in left leg: Secondary | ICD-10-CM | POA: Insufficient documentation

## 2022-04-06 NOTE — Therapy (Signed)
OUTPATIENT PHYSICAL THERAPY THORACOLUMBAR TREATMENT   Patient Name: Phillip [name] MRN: [id number] DOB:[birth year], 71 y.o., male Today's Date: 04/06/2022   PT End of Session - 04/06/22 0926     Visit Number 8    Date for PT Re-Evaluation 05/19/22    PT Start Time 0930    Activity Tolerance Patient tolerated treatment well    Behavior During Therapy [facility] for tasks assessed/performed              Past Medical History:  Diagnosis Date   Anemia    Arthritis    Diabetes mellitus    Gout    Hyperlipidemia    Hypertension    Localized osteoarthritis of left knee 11/20/2012   Migraine headache with aura    Neuromuscular disorder ([place])    " mild periferal neauropathy   Osteoarthritis of left hip 11/20/2012   PVC (premature ventricular contraction)    Sleep apnea    cpap   Past Surgical History:  Procedure Laterality Date   CHOLECYSTECTOMY  1997   CVA     CVA, occiptal   CVA     CVA, occipital   knee arthoscopy  2006   Left Knee   LEFT HEART CATH AND CORONARY ANGIOGRAPHY N/A 12/07/2017   Procedure: LEFT HEART CATH AND CORONARY ANGIOGRAPHY;  Surgeon: Troy Sine, MD;  Location: [employer] CV LAB;  Service: Cardiovascular;  Laterality: N/A;   left total hip     left unicarpmental knee     LOOP RECORDER IMPLANT N/A 04/19/2013   Procedure: LOOP RECORDER IMPLANT;  Surgeon: Evans Lance, MD;  Location: [facility] CATH LAB;  Service: Cardiovascular;  Laterality: N/A;   PARTIAL KNEE ARTHROPLASTY Left 11/20/2012   Procedure: UNICOMPARTMENTAL KNEE;  Surgeon: Johnny Bridge, MD;  Location: [place];  Service: Orthopedics;  Laterality: Left;   REPLACEMENT UNICONDYLAR JOINT KNEE Left 11/20/2012   Dr Mardelle Matte   TEE WITHOUT CARDIOVERSION N/A 04/19/2013   Procedure: TRANSESOPHAGEAL ECHOCARDIOGRAM (TEE);  Surgeon: Larey Dresser, MD;  Location: [place];  Service: Cardiovascular;  Laterality: N/A;   TONSILLECTOMY AND ADENOIDECTOMY     Childhood   TOTAL HIP ARTHROPLASTY Left 11/20/2012    Dr Mardelle Matte   TOTAL HIP ARTHROPLASTY Left 11/20/2012   Procedure: TOTAL HIP ARTHROPLASTY;  Surgeon: Johnny Bridge, MD;  Location: [place];  Service: Orthopedics;  Laterality: Left;   Patient Active Problem List   Diagnosis Date Noted   Palpitations 02/18/2022   Severe obstructive sleep apnea-hypopnea syndrome 07/01/2020   Dependence on CPAP ventilation 07/01/2020   Type 2 diabetes mellitus with diabetic neuropathy, without long-term current use of insulin ([place]) 09/02/2019   OSA and COPD overlap syndrome ([place]) 09/02/2019   Abnormal hemoglobin (H[facility]) 09/02/2019   OSA on CPAP 08/12/2015   Ventricular tachycardia ([place]) 11/10/2014   CAD (coronary artery disease), native coronary artery    Variants of migraine, not elsewhere classified, without mention of intractable migraine without mention of status migrainosus 08/14/2013   History of ischemic stroke without residual deficits 04/17/2013   ED (erectile dysfunction) 02/01/2012   Gout 09/05/2011   Hyperlipidemia 09/05/2011   Hypertriglyceridemia 09/05/2011   PVC (premature ventricular contraction) 09/05/2011   Type 2 diabetes, controlled, with neuropathy ([place]) 06/01/2011   Essential hypertension, benign 06/01/2011    PCP: [place]  REFERRING PROVIDER: Mardelle Matte  REFERRING DIAG: LBP, left leg pain and weakness  Rationale for Evaluation and Treatment Rehabilitation  THERAPY DIAG:  Other low back pain  Pain in left leg  Muscle weakness (generalized)  ONSET DATE: August 2023  SUBJECTIVE:                                                                                                                                                                                           SUBJECTIVE STATEMENT: "I am doing good" Pulled his arm last Thursday putting a TV on the wall.    PERTINENT HISTORY:  See above  PAIN:  Are you having pain? Yes: NPRS scale: 0/10 Pain location: left leg to the knee Pain description: pain and stiffness Aggravating  factors: early morning, squatting, get up after sitting pain up to 6-7/10 Relieving factors: rest, Tylenol, lie down, can be 1/10    FALLS:  Has patient fallen in last 6 months? No  LIVING ENVIRONMENT: Lives with: lives with their family Lives in: House/apartment Stairs: Yes: Internal: 16 steps; can reach both Has following equipment at home: None  OCCUPATION: some work with hospice, he is an MD, does travel over seas often  PLOF: Independent and golf 1xweek, some walking up to 2 miles a few times a week  PATIENT GOALS: get up and down from the floor, less pain, stronger, get up easier   OBJECTIVE:   DIAGNOSTIC FINDINGS:  X-rays scoliosis and degenerative changes  PATIENT SURVEYS:  FOTO 55  COGNITION:  Overall cognitive status: Within functional limits for tasks assessed     SENSATION: WFL  MUSCLE LENGTH: Very tight HS 45 degrees with pain mostly left posterior knee Tight calves, mild tightness in the piriformis  POSTURE: rounded shoulders and forward head  PALPATION: Tight in the lumbar area, non tender  LUMBAR ROM:   AROM eval  Flexion Decreased 50%  Extension 50%  Right lateral flexion Decreased 75%  Left lateral flexion Decreased 75 %  Right rotation   Left rotation    (Blank rows = not tested)  LOWER EXTREMITY ROM:      LOWER EXTREMITY MMT:    MMT Right eval Left eval  Hip flexion 4+ 4-  Hip extension    Hip abduction 4+ 4-  Hip adduction    Hip internal rotation    Hip external rotation    Knee flexion    Knee extension 4+ 4-  Ankle dorsiflexion    Ankle plantarflexion    Ankle inversion    Ankle eversion     (Blank rows = not tested)  FUNCTIONAL TESTS:  5 times sit to stand: 12 with decreased control Timed up and go (TUG): 12 seconds slight off balance with turn  GAIT: Distance walked: some ataxic LE motions, does have neuropathy Assistive device utilized: None Level  of assistance: Complete Independence Comments: unsteady  turns, does stairs one at a time up and down due to feeling like the left leg will not hold him    TODAY'S TREATMENT:  04/06/22 NuStep L5 x 6 min Calf stretch  Hip Ext 5lb x10 Leg Press 20lb 2x10 Lumbar stretch with physioball, forward, L, R. S2S Elevated surface holding blue ball x10, without ball x 10 due to it bothering his sore arm Seated trunk rotations yellow ball Lumbar traction 100# x 12 minutes.  03/29/22 Bike L3.5 x 6 minutes Lumbar stretch with physioball, forward, L, R. Lower trunk mobilization in sit- patient required mod TC and VC to isolate lower trunk. Seated rotational weight shifts, emphasizing lower trunk mobility. Mini squats over mat- 5 reps Lumbar traction 100# x 12 minutes.  03/22/22 Bike L4 x 6 minutes Assessed B calf strength. L medial calf with weak area. Heel raises on step and then on floor as patient does not have a good step at home, B and U on floor. Sit to stand with G tband resistance at knees and 4# weight in BUE to engage trunk, 10 reps Repeated while standing on Airex pad x 10 with OHP B side stepping with G tband resistance, 5 x 4 steps each direction. Traction-lumbar 90#, x 15 minutes   03/15/22 Calf stretch Seated rows 35# Lats 25# AR press 10# Straight arm pulls 10# with core activation Feet on ball K2C, trunk rotation, bridge and isometric abs Traction 82# lumbar static  03/08/22 Nustep level 5 x 6 minutes Calf stretch Seated row 20# 2x10 Lat pulls 20# 2x10 Passive HS and piriformis stretch Lumbar traction 75# x 10 minutes   03/04/22 NuStep L5 x 6 minutes Supine stretch-LTR, pelvic tilt Supine bridge with pelvic tilt x 10 Seated weight shifts into rotation Seated HS stretch Heel raises on step Side to side step against green tband resistance    02/25/22 Bike L3.5 x 6 min  S2S 2x10  6in step ups x10 each Hamstring curls 35lb 2x10 Leg Ext 10lb 2x10 Calf stretch black bar 4x15''  Leg press 40lb 2x15 Standing rows  10lb x10   PATIENT EDUCATION:  Education details: HEP and POC Person educated: Patient Education method: Consulting civil engineer, Media planner, Verbal cues, and Handouts Education comprehension: verbalized understanding   HOME EXERCISE PROGRAM: Access Code: [email or web address] URL: [email or web address] Date: 02/16/2022 Prepared by: Lum Babe  ASSESSMENT:  CLINICAL IMPRESSION: Pt enters feeling well. Stated he pulled his L arm trying t put up a TV. Avoided interventions that's required UE use. Added leg press and hip extensions without issue. Tactile cues to prevent forward trunk lean with hip Ext. Stiffness reported with seated trunk rotations  OBJECTIVE IMPAIRMENTS: Abnormal gait, decreased balance, decreased mobility, difficulty walking, decreased ROM, decreased strength, increased muscle spasms, impaired flexibility, improper body mechanics, and pain.   REHAB POTENTIAL: Good  CLINICAL DECISION MAKING: Stable/uncomplicated  EVALUATION COMPLEXITY: Low   GOALS: Goals reviewed with patient? Yes  SHORT TERM GOALS: Target date: 03/02/22  Independent with initial HEP Goal status: met   LONG TERM GOALS: Target date: 05/11/22  Understand posture and body mechanics Goal status: INITIAL  2.  Go up and down stairs step over step Goal status: ongoing  3.  Get up off the floor to play with grandkids Goal status: INITIAL  4.  Increase left LE stregnth to 4+/5 Goal status: INITIAL  5.  Squat to reach in the oven without difficulty Goal status: ongoing.   PLAN: PT FREQUENCY: 1x/week  PT  DURATION: 12 weeks  PLANNED INTERVENTIONS: Therapeutic exercises, Therapeutic activity, Neuromuscular re-education, Balance training, Gait training, Patient/Family education, Self Care, Joint mobilization, Stair training, Dry Needling, Electrical stimulation, Moist heat, Taping, Traction, and Manual therapy.  PLAN FOR NEXT SESSION: Update HEP with additional strengthening. He wants to  practice Standing<> floor transfers.   Ethel Rana DPT 04/06/22 9:28 AM  04/06/22 9:28 AM

## 2022-04-12 ENCOUNTER — Ambulatory Visit: Payer: Medicare Other | Admitting: Physical Therapy

## 2022-04-12 ENCOUNTER — Encounter: Payer: Self-pay | Admitting: Physical Therapy

## 2022-04-12 DIAGNOSIS — M5459 Other low back pain: Secondary | ICD-10-CM

## 2022-04-12 DIAGNOSIS — M79605 Pain in left leg: Secondary | ICD-10-CM | POA: Diagnosis not present

## 2022-04-12 DIAGNOSIS — M6281 Muscle weakness (generalized): Secondary | ICD-10-CM | POA: Diagnosis not present

## 2022-04-12 NOTE — Therapy (Signed)
OUTPATIENT PHYSICAL THERAPY THORACOLUMBAR TREATMENT   Patient Name: Phillip [name] MRN: [id number] DOB:14-Feb-1951, 71 y.o., male Today's Date: 04/12/2022   PT End of Session - 04/12/22 0848     Visit Number 9    Date for PT Re-Evaluation 05/19/22    PT Start Time 0848    PT Stop Time 0930    PT Time Calculation (min) 42 min    Activity Tolerance Patient tolerated treatment well    Behavior During Therapy [facility] for tasks assessed/performed              Past Medical History:  Diagnosis Date   Anemia    Arthritis    Diabetes mellitus    Gout    Hyperlipidemia    Hypertension    Localized osteoarthritis of left knee 11/20/2012   Migraine headache with aura    Neuromuscular disorder ([place])    " mild periferal neauropathy   Osteoarthritis of left hip 11/20/2012   PVC (premature ventricular contraction)    Sleep apnea    cpap   Past Surgical History:  Procedure Laterality Date   CHOLECYSTECTOMY  1997   CVA     CVA, occiptal   CVA     CVA, occipital   knee arthoscopy  2006   Left Knee   LEFT HEART CATH AND CORONARY ANGIOGRAPHY N/A 12/07/2017   Procedure: LEFT HEART CATH AND CORONARY ANGIOGRAPHY;  Surgeon: Troy Sine, MD;  Location: [place] CV LAB;  Service: Cardiovascular;  Laterality: N/A;   left total hip     left unicarpmental knee     LOOP RECORDER IMPLANT N/A 04/19/2013   Procedure: LOOP RECORDER IMPLANT;  Surgeon: Evans Lance, MD;  Location: [facility] CATH LAB;  Service: Cardiovascular;  Laterality: N/A;   PARTIAL KNEE ARTHROPLASTY Left 11/20/2012   Procedure: UNICOMPARTMENTAL KNEE;  Surgeon: Johnny Bridge, MD;  Location: [place];  Service: Orthopedics;  Laterality: Left;   REPLACEMENT UNICONDYLAR JOINT KNEE Left 11/20/2012   Dr Mardelle Matte   TEE WITHOUT CARDIOVERSION N/A 04/19/2013   Procedure: TRANSESOPHAGEAL ECHOCARDIOGRAM (TEE);  Surgeon: Larey Dresser, MD;  Location: [place];  Service: Cardiovascular;  Laterality: N/A;   TONSILLECTOMY AND ADENOIDECTOMY      Childhood   TOTAL HIP ARTHROPLASTY Left 11/20/2012   Dr Mardelle Matte   TOTAL HIP ARTHROPLASTY Left 11/20/2012   Procedure: TOTAL HIP ARTHROPLASTY;  Surgeon: Johnny Bridge, MD;  Location: [place];  Service: Orthopedics;  Laterality: Left;   Patient Active Problem List   Diagnosis Date Noted   Palpitations 02/18/2022   Severe obstructive sleep apnea-hypopnea syndrome 07/01/2020   Dependence on CPAP ventilation 07/01/2020   Type 2 diabetes mellitus with diabetic neuropathy, without long-term current use of insulin ([place]) 09/02/2019   OSA and COPD overlap syndrome ([place]) 09/02/2019   Abnormal hemoglobin ([facility]) 09/02/2019   OSA on CPAP 08/12/2015   Ventricular tachycardia ([place]) 11/10/2014   CAD (coronary artery disease), native coronary artery    Variants of migraine, not elsewhere classified, without mention of intractable migraine without mention of status migrainosus 08/14/2013   History of ischemic stroke without residual deficits 04/17/2013   ED (erectile dysfunction) 02/01/2012   Gout 09/05/2011   Hyperlipidemia 09/05/2011   Hypertriglyceridemia 09/05/2011   PVC (premature ventricular contraction) 09/05/2011   Type 2 diabetes, controlled, with neuropathy ([place]) 06/01/2011   Essential hypertension, benign 06/01/2011    PCP: [place]  REFERRING PROVIDER: Mardelle Matte  REFERRING DIAG: LBP, left leg pain and weakness  Rationale for Evaluation and  Treatment Rehabilitation  THERAPY DIAG:  Other low back pain  Pain in left leg  Muscle weakness (generalized)  ONSET DATE: August 2023  SUBJECTIVE:                                                                                                                                                                                           SUBJECTIVE STATEMENT: Doing ok, L knee has been giving him more trouble, back is ok  PERTINENT HISTORY:  See above  PAIN:  Are you having pain? Yes: NPRS scale: 0/10 Pain location: left leg to the knee Pain  description: pain and stiffness Aggravating factors: early morning, squatting, get up after sitting pain up to 6-7/10 Relieving factors: rest, Tylenol, lie down, can be 1/10    FALLS:  Has patient fallen in last 6 months? No  LIVING ENVIRONMENT: Lives with: lives with their family Lives in: House/apartment Stairs: Yes: Internal: 16 steps; can reach both Has following equipment at home: None  OCCUPATION: some work with hospice, he is an MD, does travel over seas often  PLOF: Independent and golf 1xweek, some walking up to 2 miles a few times a week  PATIENT GOALS: get up and down from the floor, less pain, stronger, get up easier   OBJECTIVE:   DIAGNOSTIC FINDINGS:  X-rays scoliosis and degenerative changes  PATIENT SURVEYS:  FOTO 55  COGNITION:  Overall cognitive status: Within functional limits for tasks assessed     SENSATION: WFL  MUSCLE LENGTH: Very tight HS 45 degrees with pain mostly left posterior knee Tight calves, mild tightness in the piriformis  POSTURE: rounded shoulders and forward head  PALPATION: Tight in the lumbar area, non tender  LUMBAR ROM:   AROM eval  Flexion Decreased 50%  Extension 50%  Right lateral flexion Decreased 75%  Left lateral flexion Decreased 75 %  Right rotation   Left rotation    (Blank rows = not tested)  LOWER EXTREMITY ROM:      LOWER EXTREMITY MMT:    MMT Right eval Left eval  Hip flexion 4+ 4-  Hip extension    Hip abduction 4+ 4-  Hip adduction    Hip internal rotation    Hip external rotation    Knee flexion    Knee extension 4+ 4-  Ankle dorsiflexion    Ankle plantarflexion    Ankle inversion    Ankle eversion     (Blank rows = not tested)  FUNCTIONAL TESTS:  5 times sit to stand: 12 with decreased control Timed up and go (TUG): 12 seconds slight off balance with turn  GAIT: Distance walked:  some ataxic LE motions, does have neuropathy Assistive device utilized: None Level of assistance:  Complete Independence Comments: unsteady turns, does stairs one at a time up and down due to feeling like the left leg will not hold him    TODAY'S TREATMENT:  04/01/22 Bike L 3.5 x 6 min Resisted gait 40lb 4 way x3 each  Calf stretch Leg Press 40lb 2x12 Stairs 6 in x10 two rails alternating pattern Lumbar traction 100# x 12 minutes.    04/06/22 NuStep L5 x 6 min Calf stretch  Hip Ext 5lb x10 Leg Press 20lb 2x10 Lumbar stretch with physioball, forward, L, R. S2S Elevated surface holding blue ball x10, without ball x 10 due to it bothering his sore arm Seated trunk rotations yellow ball Lumbar traction 100# x 12 minutes.  03/29/22 Bike L3.5 x 6 minutes Lumbar stretch with physioball, forward, L, R. Lower trunk mobilization in sit- patient required mod TC and VC to isolate lower trunk. Seated rotational weight shifts, emphasizing lower trunk mobility. Mini squats over mat- 5 reps Lumbar traction 100# x 12 minutes.  03/22/22 Bike L4 x 6 minutes Assessed B calf strength. L medial calf with weak area. Heel raises on step and then on floor as patient does not have a good step at home, B and U on floor. Sit to stand with G tband resistance at knees and 4# weight in BUE to engage trunk, 10 reps Repeated while standing on Airex pad x 10 with OHP B side stepping with G tband resistance, 5 x 4 steps each direction. Traction-lumbar 90#, x 15 minutes  PATIENT EDUCATION:  Education details: HEP and POC Person educated: Patient Education method: Consulting civil engineer, Demonstration, Verbal cues, and Handouts Education comprehension: verbalized understanding   HOME EXERCISE PROGRAM: Access Code: [email or web address] URL: [email or web address] Date: 02/16/2022 Prepared by: Lum Babe  ASSESSMENT:  CLINICAL IMPRESSION: Pt enters feeling well. He continues to reports some L knee pain with activity and descending stairs. Low back pain has greatly improved. Increase ressitance  tolerated on leg press. Some instability with resisted side steps.   OBJECTIVE IMPAIRMENTS: Abnormal gait, decreased balance, decreased mobility, difficulty walking, decreased ROM, decreased strength, increased muscle spasms, impaired flexibility, improper body mechanics, and pain.   REHAB POTENTIAL: Good  CLINICAL DECISION MAKING: Stable/uncomplicated  EVALUATION COMPLEXITY: Low   GOALS: Goals reviewed with patient? Yes  SHORT TERM GOALS: Target date: 03/02/22  Independent with initial HEP Goal status: met   LONG TERM GOALS: Target date: 05/11/22  Understand posture and body mechanics Goal status: INITIAL  2.  Go up and down stairs step over step Goal status: ongoing  3.  Get up off the floor to play with grandkids Goal status: ongoing  04/12/22  grand kids has not been over   4.  Increase left LE stregnth to 4+/5 Goal status: INITIAL  5.  Squat to reach in the oven without difficulty Goal status: ongoing. 04/12/22 have not tried   PLAN: PT FREQUENCY: 1x/week  PT DURATION: 12 weeks  PLANNED INTERVENTIONS: Therapeutic exercises, Therapeutic activity, Neuromuscular re-education, Balance training, Gait training, Patient/Family education, Self Care, Joint mobilization, Stair training, Dry Needling, Electrical stimulation, Moist heat, Taping, Traction, and Manual therapy.  PLAN FOR NEXT SESSION: Update HEP with additional strengthening. He wants to practice Standing<> floor transfers.   Ethel Rana DPT 04/12/22 8:48 AM  04/12/22 8:48 AM

## 2022-04-13 DIAGNOSIS — G4733 Obstructive sleep apnea (adult) (pediatric): Secondary | ICD-10-CM | POA: Diagnosis not present

## 2022-04-19 ENCOUNTER — Ambulatory Visit: Payer: Medicare Other | Admitting: Physical Therapy

## 2022-04-19 ENCOUNTER — Encounter: Payer: Self-pay | Admitting: Physical Therapy

## 2022-04-19 DIAGNOSIS — M6281 Muscle weakness (generalized): Secondary | ICD-10-CM

## 2022-04-19 DIAGNOSIS — M79605 Pain in left leg: Secondary | ICD-10-CM | POA: Diagnosis not present

## 2022-04-19 DIAGNOSIS — M5459 Other low back pain: Secondary | ICD-10-CM

## 2022-04-19 NOTE — Therapy (Signed)
OUTPATIENT PHYSICAL THERAPY THORACOLUMBAR TREATMENT Progress Note Reporting Period 02/16/22 to 04/19/22  See note below for Objective Data and Assessment of Progress/Goals.      Patient Name: Phillip [name] MRN: [id number] DOB:01-May-1951, 71 y.o., male Today's Date: 04/19/2022   PT End of Session - 04/19/22 0855     Visit Number 10    Date for PT Re-Evaluation 05/19/22    Authorization Type BCBS medicare    PT Start Time 0845    PT Stop Time 0930    PT Time Calculation (min) 45 min    Activity Tolerance Patient tolerated treatment well    Behavior During Therapy [facility] for tasks assessed/performed              Past Medical History:  Diagnosis Date   Anemia    Arthritis    Diabetes mellitus    Gout    Hyperlipidemia    Hypertension    Localized osteoarthritis of left knee 11/20/2012   Migraine headache with aura    Neuromuscular disorder ([place])    " mild periferal neauropathy   Osteoarthritis of left hip 11/20/2012   PVC (premature ventricular contraction)    Sleep apnea    cpap   Past Surgical History:  Procedure Laterality Date   CHOLECYSTECTOMY  1997   CVA     CVA, occiptal   CVA     CVA, occipital   knee arthoscopy  2006   Left Knee   LEFT HEART CATH AND CORONARY ANGIOGRAPHY N/A 12/07/2017   Procedure: LEFT HEART CATH AND CORONARY ANGIOGRAPHY;  Surgeon: Troy Sine, MD;  Location: [place] [facility];  Service: Cardiovascular;  Laterality: N/A;   left total hip     left unicarpmental knee     LOOP RECORDER IMPLANT N/A 04/19/2013   Procedure: LOOP RECORDER IMPLANT;  Surgeon: Evans Lance, MD;  Location: [facility] CATH LAB;  Service: Cardiovascular;  Laterality: N/A;   PARTIAL KNEE ARTHROPLASTY Left 11/20/2012   Procedure: UNICOMPARTMENTAL KNEE;  Surgeon: Johnny Bridge, MD;  Location: [place];  Service: Orthopedics;  Laterality: Left;   REPLACEMENT UNICONDYLAR JOINT KNEE Left 11/20/2012   Dr Mardelle Matte   TEE WITHOUT CARDIOVERSION N/A 04/19/2013   Procedure:  TRANSESOPHAGEAL ECHOCARDIOGRAM (TEE);  Surgeon: Larey Dresser, MD;  Location: [place];  Service: Cardiovascular;  Laterality: N/A;   TONSILLECTOMY AND ADENOIDECTOMY     Childhood   TOTAL HIP ARTHROPLASTY Left 11/20/2012   Dr Mardelle Matte   TOTAL HIP ARTHROPLASTY Left 11/20/2012   Procedure: TOTAL HIP ARTHROPLASTY;  Surgeon: Johnny Bridge, MD;  Location: [place];  Service: Orthopedics;  Laterality: Left;   Patient Active Problem List   Diagnosis Date Noted   Palpitations 02/18/2022   Severe obstructive sleep apnea-hypopnea syndrome 07/01/2020   Dependence on CPAP ventilation 07/01/2020   Type 2 diabetes mellitus with diabetic neuropathy, without long-term current use of insulin ([place]) 09/02/2019   OSA and COPD overlap syndrome ([place]) 09/02/2019   Abnormal hemoglobin (HCC) 09/02/2019   OSA on CPAP 08/12/2015   Ventricular tachycardia ([place]) 11/10/2014   CAD (coronary artery disease), native coronary artery    Variants of migraine, not elsewhere classified, without mention of intractable migraine without mention of status migrainosus 08/14/2013   History of ischemic stroke without residual deficits 04/17/2013   ED (erectile dysfunction) 02/01/2012   Gout 09/05/2011   Hyperlipidemia 09/05/2011   Hypertriglyceridemia 09/05/2011   PVC (premature ventricular contraction) 09/05/2011   Type 2 diabetes, controlled, with neuropathy ([place]) 06/01/2011  Essential hypertension, benign 06/01/2011    PCP: [facility]  REFERRING PROVIDER: [place]  REFERRING DIAG: LBP, left leg pain and weakness  Rationale for Evaluation and Treatment Rehabilitation  THERAPY DIAG:  Other low back pain  Pain in left leg  Muscle weakness (generalized)  ONSET DATE: August 2023  SUBJECTIVE:                                                                                                                                                                                           SUBJECTIVE STATEMENT: Patient reports  that he had significant pain in the left PF area after stretching here last week, he also reports that he was moving a TV last week and is having some left arm pain  PERTINENT HISTORY:  See above  PAIN:  Are you having pain? Yes: NPRS scale: 4/10 Pain location: left leg to the knee Pain description: pain and stiffness Aggravating factors: early morning, squatting, get up after sitting pain up to 6-7/10 Relieving factors: rest, Tylenol, lie down, can be 1/10    FALLS:  Has patient fallen in last 6 months? No  LIVING ENVIRONMENT: Lives with: lives with their family Lives in: House/apartment Stairs: Yes: Internal: 16 steps; can reach both Has following equipment at home: None  OCCUPATION: some work with hospice, he is an MD, does travel over seas often  PLOF: Independent and golf 1xweek, some walking up to 2 miles a few times a week  PATIENT GOALS: get up and down from the floor, less pain, stronger, get up easier   OBJECTIVE:   DIAGNOSTIC FINDINGS:  X-rays scoliosis and degenerative changes  PATIENT SURVEYS:  FOTO 55  COGNITION:  Overall cognitive status: Within functional limits for tasks assessed     SENSATION: WFL  MUSCLE LENGTH: Very tight HS 45 degrees with pain mostly left posterior knee Tight calves, mild tightness in the piriformis  POSTURE: rounded shoulders and forward head  PALPATION: Tight in the lumbar area, non tender  LUMBAR ROM:   AROM eval  Flexion Decreased 50%  Extension 50%  Right lateral flexion Decreased 75%  Left lateral flexion Decreased 75 %  Right rotation   Left rotation    (Blank rows = not tested)  LOWER EXTREMITY ROM:      LOWER EXTREMITY MMT:    MMT Right eval Left eval  Hip flexion 4+ 4-  Hip extension    Hip abduction 4+ 4-  Hip adduction    Hip internal rotation    Hip external rotation    Knee flexion    Knee extension 4+ 4-  Ankle dorsiflexion    Ankle   plantarflexion    Ankle inversion    Ankle  eversion     (Blank rows = not tested)  FUNCTIONAL TESTS:  5 times sit to stand: 12 with decreased control Timed up and go (TUG): 12 seconds slight off balance with turn  GAIT: Distance walked: some ataxic LE motions, does have neuropathy Assistive device utilized: None Level of assistance: Complete Independence Comments: unsteady turns, does stairs one at a time up and down due to feeling like the left leg will not hold him    TODAY'S TREATMENT:  04/19/22 Nustep Level 5 x 6 minutes Pball roll outs Passive stretch of the LE's Feet on ball K2C, trunk rotation, small bridge and isometric abs Lumbar traction 80# static  04/01/22 Bike L 3.5 x 6 min Resisted gait 40lb 4 way x3 each  Calf stretch Leg Press 40lb 2x12 Stairs 6 in x10 two rails alternating pattern Lumbar traction 100# x 12 minutes.    04/06/22 NuStep L5 x 6 min Calf stretch  Hip Ext 5lb x10 Leg Press 20lb 2x10 Lumbar stretch with physioball, forward, L, R. S2S Elevated surface holding blue ball x10, without ball x 10 due to it bothering his sore arm Seated trunk rotations yellow ball Lumbar traction 100# x 12 minutes.  03/29/22 Bike L3.5 x 6 minutes Lumbar stretch with physioball, forward, L, R. Lower trunk mobilization in sit- patient required mod TC and VC to isolate lower trunk. Seated rotational weight shifts, emphasizing lower trunk mobility. Mini squats over mat- 5 reps Lumbar traction 100# x 12 minutes.  03/22/22 Bike L4 x 6 minutes Assessed B calf strength. L medial calf with weak area. Heel raises on step and then on floor as patient does not have a good step at home, B and U on floor. Sit to stand with G tband resistance at knees and 4# weight in BUE to engage trunk, 10 reps Repeated while standing on Airex pad x 10 with OHP B side stepping with G tband resistance, 5 x 4 steps each direction. Traction-lumbar 90#, x 15 minutes  PATIENT EDUCATION:  Education details: HEP and POC Person  educated: Patient Education method: Explanation, Demonstration, Verbal cues, and Handouts Education comprehension: verbalized understanding   HOME EXERCISE PROGRAM: Access Code: [email or web address] URL: [email or web address] Date: 02/16/2022 Prepared by: Michael Albright  ASSESSMENT:  CLINICAL IMPRESSION: Pt reports increased PF pain on the left after the calf stretches last time, he also reports lifting a TV and it slipped and is having some left arm pain, RC seems intact.  Seems to be more biceps issue.  He does report that the PT has helped his back,  He is very tight in the HS  OBJECTIVE IMPAIRMENTS: Abnormal gait, decreased balance, decreased mobility, difficulty walking, decreased ROM, decreased strength, increased muscle spasms, impaired flexibility, improper body mechanics, and pain.   REHAB POTENTIAL: Good  CLINICAL DECISION MAKING: Stable/uncomplicated  EVALUATION COMPLEXITY: Low   GOALS: Goals reviewed with patient? Yes  SHORT TERM GOALS: Target date: 03/02/22  Independent with initial HEP Goal status: met   LONG TERM GOALS: Target date: 05/11/22  Understand posture and body mechanics Goal status:partially met  2.  Go up and down stairs step over step Goal status: ongoing  3.  Get up off the floor to play with grandkids Goal status: ongoing  04/12/22  grand kids has not been over   4.  Increase left LE stregnth to 4+/5 Goal status:partially met  5.  Squat to reach in the oven without difficulty Goal   status: ongoing. 04/12/22 have not tried   PLAN: PT FREQUENCY: 1x/week  PT DURATION: 12 weeks  PLANNED INTERVENTIONS: Therapeutic exercises, Therapeutic activity, Neuromuscular re-education, Balance training, Gait training, Patient/Family education, Self Care, Joint mobilization, Stair training, Dry Needling, Electrical stimulation, Moist heat, Taping, Traction, and Manual therapy.  PLAN FOR NEXT SESSION:See how he is doing, he has family here for the  next month  Michael Albright, PT 04/19/22 8:56 AM  04/19/22 8:56 AM  

## 2022-04-22 DIAGNOSIS — I4719 Other supraventricular tachycardia: Secondary | ICD-10-CM | POA: Insufficient documentation

## 2022-04-26 ENCOUNTER — Ambulatory Visit: Payer: Medicare Other | Admitting: Physical Therapy

## 2022-04-27 ENCOUNTER — Ambulatory Visit: Payer: Medicare Other | Admitting: Physical Therapy

## 2022-04-27 ENCOUNTER — Encounter: Payer: Self-pay | Admitting: Physical Therapy

## 2022-04-27 DIAGNOSIS — M79605 Pain in left leg: Secondary | ICD-10-CM

## 2022-04-27 DIAGNOSIS — M6281 Muscle weakness (generalized): Secondary | ICD-10-CM | POA: Diagnosis not present

## 2022-04-27 DIAGNOSIS — M5459 Other low back pain: Secondary | ICD-10-CM

## 2022-04-27 NOTE — Therapy (Signed)
OUTPATIENT PHYSICAL THERAPY THORACOLUMBAR TREATMENT    Patient Name: Phillip [name] MRN: [id number] DOB:03/28/1951, 71 y.o., male Today's Date: 04/27/2022   PT End of Session - 04/27/22 1601     Visit Number 11    Date for PT Re-Evaluation 05/19/22    Authorization Type BCBS medicare    PT Start Time 1600    PT Stop Time 1645    PT Time Calculation (min) 45 min    Activity Tolerance Patient tolerated treatment well    Behavior During Therapy [facility] for tasks assessed/performed              Past Medical History:  Diagnosis Date   Anemia    Arthritis    Diabetes mellitus    Gout    Hyperlipidemia    Hypertension    Localized osteoarthritis of left knee 11/20/2012   Migraine headache with aura    Neuromuscular disorder ([place])    " mild periferal neauropathy   Osteoarthritis of left hip 11/20/2012   PVC (premature ventricular contraction)    Sleep apnea    cpap   Past Surgical History:  Procedure Laterality Date   CHOLECYSTECTOMY  1997   CVA     CVA, occiptal   CVA     CVA, occipital   knee arthoscopy  2006   Left Knee   LEFT HEART CATH AND CORONARY ANGIOGRAPHY N/A 12/07/2017   Procedure: LEFT HEART CATH AND CORONARY ANGIOGRAPHY;  Surgeon: Troy Sine, MD;  Location: [place] [facility];  Service: Cardiovascular;  Laterality: N/A;   left total hip     left unicarpmental knee     LOOP RECORDER IMPLANT N/A 04/19/2013   Procedure: LOOP RECORDER IMPLANT;  Surgeon: Evans Lance, MD;  Location: [facility] CATH LAB;  Service: Cardiovascular;  Laterality: N/A;   PARTIAL KNEE ARTHROPLASTY Left 11/20/2012   Procedure: UNICOMPARTMENTAL KNEE;  Surgeon: Johnny Bridge, MD;  Location: [place];  Service: Orthopedics;  Laterality: Left;   REPLACEMENT UNICONDYLAR JOINT KNEE Left 11/20/2012   Dr Mardelle Matte   TEE WITHOUT CARDIOVERSION N/A 04/19/2013   Procedure: TRANSESOPHAGEAL ECHOCARDIOGRAM (TEE);  Surgeon: Larey Dresser, MD;  Location: [place];  Service: Cardiovascular;  Laterality:  N/A;   TONSILLECTOMY AND ADENOIDECTOMY     Childhood   TOTAL HIP ARTHROPLASTY Left 11/20/2012   Dr Mardelle Matte   TOTAL HIP ARTHROPLASTY Left 11/20/2012   Procedure: TOTAL HIP ARTHROPLASTY;  Surgeon: Johnny Bridge, MD;  Location: [place];  Service: Orthopedics;  Laterality: Left;   Patient Active Problem List   Diagnosis Date Noted   Atrial tachycardia 04/22/2022   Palpitations 02/18/2022   Severe obstructive sleep apnea-hypopnea syndrome 07/01/2020   Dependence on CPAP ventilation 07/01/2020   Type 2 diabetes mellitus with diabetic neuropathy, without long-term current use of insulin ([place]) 09/02/2019   OSA and COPD overlap syndrome ([place]) 09/02/2019   Abnormal hemoglobin (HCC) 09/02/2019   OSA on CPAP 08/12/2015   Ventricular tachycardia ([place]) 11/10/2014   CAD (coronary artery disease), native coronary artery    Variants of migraine, not elsewhere classified, without mention of intractable migraine without mention of status migrainosus 08/14/2013   History of ischemic stroke without residual deficits 04/17/2013   ED (erectile dysfunction) 02/01/2012   Gout 09/05/2011   Hyperlipidemia 09/05/2011   Hypertriglyceridemia 09/05/2011   PVC (premature ventricular contraction) 09/05/2011   Type 2 diabetes, controlled, with neuropathy ([place]) 06/01/2011   Essential hypertension, benign 06/01/2011    PCP: [facility]  REFERRING PROVIDER: Mardelle Matte  REFERRING DIAG: LBP, left leg pain and weakness  Rationale for Evaluation and Treatment Rehabilitation  THERAPY DIAG:  Other low back pain  Pain in left leg  Muscle weakness (generalized)  ONSET DATE: August 2023  SUBJECTIVE:                                                                                                                                                                                           SUBJECTIVE STATEMENT: Went on a walk earlier 2 miles, felt pretty good  PERTINENT HISTORY:  See above  PAIN:  Are you having pain? Yes:  NPRS scale: 2/10 Pain location: left heel Pain description: pain and stiffness Aggravating factors: early morning, squatting, get up after sitting pain up to 6-7/10 Relieving factors: rest, Tylenol, lie down, can be 1/10    FALLS:  Has patient fallen in last 6 months? No  LIVING ENVIRONMENT: Lives with: lives with their family Lives in: House/apartment Stairs: Yes: Internal: 16 steps; can reach both Has following equipment at home: None  OCCUPATION: some work with hospice, he is an MD, does travel over seas often  PLOF: Independent and golf 1xweek, some walking up to 2 miles a few times a week  PATIENT GOALS: get up and down from the floor, less pain, stronger, get up easier   OBJECTIVE:   DIAGNOSTIC FINDINGS:  X-rays scoliosis and degenerative changes  PATIENT SURVEYS:  FOTO 55  COGNITION:  Overall cognitive status: Within functional limits for tasks assessed     SENSATION: WFL  MUSCLE LENGTH: Very tight HS 45 degrees with pain mostly left posterior knee Tight calves, mild tightness in the piriformis  POSTURE: rounded shoulders and forward head  PALPATION: Tight in the lumbar area, non tender  LUMBAR ROM:   AROM eval  Flexion Decreased 50%  Extension 50%  Right lateral flexion Decreased 75%  Left lateral flexion Decreased 75 %  Right rotation   Left rotation    (Blank rows = not tested)  LOWER EXTREMITY ROM:      LOWER EXTREMITY MMT:    MMT Right eval Left eval  Hip flexion 4+ 4-  Hip extension    Hip abduction 4+ 4-  Hip adduction    Hip internal rotation    Hip external rotation    Knee flexion    Knee extension 4+ 4-  Ankle dorsiflexion    Ankle plantarflexion    Ankle inversion    Ankle eversion     (Blank rows = not tested)  FUNCTIONAL TESTS:  5 times sit to stand: 12 with decreased control Timed up and go (TUG): 12 seconds slight off  balance with turn  GAIT: Distance walked: some ataxic LE motions, does have  neuropathy Assistive device utilized: None Level of assistance: Complete Independence Comments: unsteady turns, does stairs one at a time up and down due to feeling like the left leg will not hold him    TODAY'S TREATMENT:  04/27/22 NuStep L5 x 6 min Pball roll outs Passive stretch of the LE's Feet on ball K2C, trunk rotation, small bridge and isometric abs Lumbar traction 90# static   04/19/22 Nustep Level 5 x 6 minutes Pball roll outs Passive stretch of the LE's Feet on ball K2C, trunk rotation, small bridge and isometric abs Lumbar traction 80# static  04/01/22 Bike L 3.5 x 6 min Resisted gait 40lb 4 way x3 each  Calf stretch Leg Press 40lb 2x12 Stairs 6 in x10 two rails alternating pattern Lumbar traction 100# x 12 minutes.    04/06/22 NuStep L5 x 6 min Calf stretch  Hip Ext 5lb x10 Leg Press 20lb 2x10 Lumbar stretch with physioball, forward, L, R. S2S Elevated surface holding blue ball x10, without ball x 10 due to it bothering his sore arm Seated trunk rotations yellow ball Lumbar traction 100# x 12 minutes.  03/29/22 Bike L3.5 x 6 minutes Lumbar stretch with physioball, forward, L, R. Lower trunk mobilization in sit- patient required mod TC and VC to isolate lower trunk. Seated rotational weight shifts, emphasizing lower trunk mobility. Mini squats over mat- 5 reps Lumbar traction 100# x 12 minutes.  03/22/22 Bike L4 x 6 minutes Assessed B calf strength. L medial calf with weak area. Heel raises on step and then on floor as patient does not have a good step at home, B and U on floor. Sit to stand with G tband resistance at knees and 4# weight in BUE to engage trunk, 10 reps Repeated while standing on Airex pad x 10 with OHP B side stepping with G tband resistance, 5 x 4 steps each direction. Traction-lumbar 90#, x 15 minutes  PATIENT EDUCATION:  Education details: HEP and POC Person educated: Patient Education method: Consulting civil engineer, Demonstration,  Verbal cues, and Handouts Education comprehension: verbalized understanding   HOME EXERCISE PROGRAM: Access Code: [email or web address] URL: [email or web address] Date: 02/16/2022 Prepared by: Lum Babe  ASSESSMENT:  CLINICAL IMPRESSION: Pt continues to have some L heel pain, avoided standing activities. Continued with LE stretching and supine strengthening interventions.  He does report that the PT has helped his back,  He is very tight in the HS, required multiple cues to relax. Added more pull to traction  OBJECTIVE IMPAIRMENTS: Abnormal gait, decreased balance, decreased mobility, difficulty walking, decreased ROM, decreased strength, increased muscle spasms, impaired flexibility, improper body mechanics, and pain.   REHAB POTENTIAL: Good  CLINICAL DECISION MAKING: Stable/uncomplicated  EVALUATION COMPLEXITY: Low   GOALS: Goals reviewed with patient? Yes  SHORT TERM GOALS: Target date: 03/02/22  Independent with initial HEP Goal status: met   LONG TERM GOALS: Target date: 05/11/22  Understand posture and body mechanics Goal status:partially met  2.  Go up and down stairs step over step Goal status: ongoing  3.  Get up off the floor to play with grandkids Goal status: ongoing  04/12/22  grand kids has not been over   4.  Increase left LE stregnth to 4+/5 Goal status:partially met  5.  Squat to reach in the oven without difficulty Goal status: ongoing. 04/12/22 have not tried   PLAN: PT FREQUENCY: 1x/week  PT DURATION: 12 weeks  PLANNED INTERVENTIONS: Therapeutic exercises,  Therapeutic activity, Neuromuscular re-education, Balance training, Gait training, Patient/Family education, Self Care, Joint mobilization, Stair training, Dry Needling, Electrical stimulation, Moist heat, Taping, Traction, and Manual therapy.  PLAN FOR NEXT SESSION:See how he is doing, he has family here for the next month  Lum Babe, PT 04/27/22 4:01 PM  04/27/22 4:01  PM

## 2022-04-29 ENCOUNTER — Ambulatory Visit: Payer: Medicare Other | Attending: Internal Medicine | Admitting: Internal Medicine

## 2022-04-29 ENCOUNTER — Encounter: Payer: Self-pay | Admitting: Internal Medicine

## 2022-04-29 VITALS — BP 175/82 | HR 70 | Ht 72.0 in | Wt 226.0 lb

## 2022-04-29 DIAGNOSIS — R002 Palpitations: Secondary | ICD-10-CM

## 2022-04-29 DIAGNOSIS — I4719 Other supraventricular tachycardia: Secondary | ICD-10-CM | POA: Diagnosis not present

## 2022-04-29 DIAGNOSIS — I472 Ventricular tachycardia, unspecified: Secondary | ICD-10-CM

## 2022-04-29 DIAGNOSIS — I10 Essential (primary) hypertension: Secondary | ICD-10-CM

## 2022-04-29 MED ORDER — HYDROCHLOROTHIAZIDE 12.5 MG PO CAPS: 12.5000 mg | ORAL_CAPSULE | Freq: Every day | ORAL | 3 refills | Status: DC

## 2022-04-29 NOTE — Progress Notes (Signed)
Patient Care Team: Copland, Gay Filler, MD as PCP - General (Family Medicine)   HPI  Phillip [name] is a 71 y.o. male here for loop recorder removal.  Implanted 2014.  For cryptogenic stroke.  History of palpitations and nonsustained ventricular tachycardia.,  Monitor was reviewed with atrial and ventricular ectopy nonsustained atrial and ventricular tachycardia, it was recommended that we would continue the Bystolic.  Irregularities are palpated sometimes with or without palpitations  DATE TEST EF    7/14 Echo 60-65%    8/19 LHC    NonObstructive CAD  4/22 CaScore   75    Date Cr K Hgb  1/23 1.17 4.1 13.6(9/22)             Blood pressures at home have been 140+ in the evenings.  No chest pain.  No edema  Records and Results Reviewed   Past Medical History:  Diagnosis Date   Anemia    Arthritis    Diabetes mellitus    Gout    Hyperlipidemia    Hypertension    Localized osteoarthritis of left knee 11/20/2012   Migraine headache with aura    Neuromuscular disorder ([place])    " mild periferal neauropathy   Osteoarthritis of left hip 11/20/2012   PVC (premature ventricular contraction)    Sleep apnea    cpap    Past Surgical History:  Procedure Laterality Date   CHOLECYSTECTOMY  1997   CVA     CVA, occiptal   CVA     CVA, occipital   knee arthoscopy  2006   Left Knee   LEFT HEART CATH AND CORONARY ANGIOGRAPHY N/A 12/07/2017   Procedure: LEFT HEART CATH AND CORONARY ANGIOGRAPHY;  Surgeon: Troy Sine, MD;  Location: [facility];  Service: Cardiovascular;  Laterality: N/A;   left total hip     left unicarpmental knee     LOOP RECORDER IMPLANT N/A 04/19/2013   Procedure: LOOP RECORDER IMPLANT;  Surgeon: Evans Lance, MD;  Location: [facility];  Service: Cardiovascular;  Laterality: N/A;   PARTIAL KNEE ARTHROPLASTY Left 11/20/2012   Procedure: UNICOMPARTMENTAL KNEE;  Surgeon: Johnny Bridge, MD;  Location: [place];  Service: Orthopedics;   Laterality: Left;   REPLACEMENT UNICONDYLAR JOINT KNEE Left 11/20/2012   Dr Mardelle Matte   TEE WITHOUT CARDIOVERSION N/A 04/19/2013   Procedure: TRANSESOPHAGEAL ECHOCARDIOGRAM (TEE);  Surgeon: Larey Dresser, MD;  Location: [place];  Service: Cardiovascular;  Laterality: N/A;   TONSILLECTOMY AND ADENOIDECTOMY     Childhood   TOTAL HIP ARTHROPLASTY Left 11/20/2012   Dr Mardelle Matte   TOTAL HIP ARTHROPLASTY Left 11/20/2012   Procedure: TOTAL HIP ARTHROPLASTY;  Surgeon: Johnny Bridge, MD;  Location: [place];  Service: Orthopedics;  Laterality: Left;    Current Meds  Medication Sig   acetic acid 2 % otic solution Place 4 drops into both ears 4 (four) times daily.   Alcohol Swabs PADS Use to test up to 4 times daily   ALEVE 220 MG tablet Take 220-440 mg by mouth 2 (two) times daily as needed (for headaches).   allopurinol (ZYLOPRIM) 300 MG tablet TAKE 1 TABLET BY MOUTH DAILY   blood glucose meter kit and supplies KIT Pt needs One Touch Ultra glucometer.  Use up to BID to check blood sugars   clopidogrel (PLAVIX) 75 MG tablet Take 1 tablet (75 mg total) by mouth daily.   dapagliflozin propanediol (FARXIGA) 10 MG TABS  tablet Take 1 tablet (10 mg total) by mouth daily before breakfast.   diclofenac sodium (VOLTAREN) 1 % GEL Apply 2-4 g topically 2 (two) times daily as needed (for pain).   glucose blood (FREESTYLE LITE) test strip USE TO TEST UP TO 4 TIMES DAILY   insulin glargine (LANTUS SOLOSTAR) 100 UNIT/ML Solostar Pen ADMINISTER 30 UNITS UNDER THE SKIN DAILY   Insulin Pen Needle (PEN NEEDLES) 32G X 5 MM MISC 1 each by Does not apply route daily. Use for basal insulin injection   losartan (COZAAR) 100 MG tablet TAKE 1 TABLET(100 MG) BY MOUTH DAILY   metFORMIN (GLUCOPHAGE-XR) 500 MG 24 hr tablet TAKE 2 TABLETS(1000 MG) BY MOUTH TWICE DAILY   nebivolol (BYSTOLIC) 10 MG tablet TAKE 1 TABLET BY MOUTH EVERY EVENING.   rOPINIRole (REQUIP) 0.25 MG tablet START BY TAKING 1 TABLET BY MOUTH EVERY NIGHT AT  BEDTIME AS NEEDED. CAN INCREASE TO 2 TABLETS BY MOUTH AFTER 2 DAYS   rosuvastatin (CRESTOR) 5 MG tablet TAKE 1 TABLET BY MOUTH EVERY MONDAY, WEDNESDAY, AND FRIDAY   Semaglutide, 1 MG/DOSE, 4 MG/3ML SOPN Inject 1 mg as directed once a week.   silodosin (RAPAFLO) 8 MG CAPS capsule Take 8 mg by mouth daily.   SM LANCETS 33G MISC USE TO TEST BLOOD SUGAR TWICE DAILY   tadalafil (CIALIS) 5 MG tablet Take 5 mg by mouth daily as needed for erectile dysfunction (for urinary dx).    Allergies  Allergen Reactions   Sulfa Antibiotics Rash and Other (See Comments)    Reaction not confirmed, but a possibility (from childhood)      Review of Systems negative except from HPI and PMH  Physical Exam BP (!) 160/90   Pulse 70   Ht 6' (1.829 m)   Wt 226 lb (102.5 kg)   SpO2 97%   BMI 30.65 kg/m  Well developed and well nourished in no acute distress HENT normal E scleral and icterus clear Neck Supple JVP flat; carotids brisk and full Clear to ausculation Regular rate and rhythm, no murmurs gallops or rub Soft with active bowel sounds No clubbing cyanosis  Edema Alert and oriented, grossly normal motor and sensory function Skin Warm and Dry  ECG sinus at 70 Interval 21/11/39 Low amplitude Inferior Q waves  CrCl cannot be calculated (Patient's most recent lab result is older than the maximum 21 days allowed.).   Assessment and  Plan Palpitations  with atrial and ventricular ectopy, infrequent   VTNS   Atrial tach   Cryptogenic Stroke 2014   Implantable LINQ past EOS   HTN   DM   Coronary Artery calcifications  Loop recorder to be explanted  Blood pressure remains elevated  Will add hydrochlorothiazide 12.5 mg.  Previously tolerated.  He is to see his PCP next week, metabolic profile would be appropriate and have asked him to ask her to do this.   Phillip [name] [id number]  [phone]  Preop Dx:Cryptogenic Stroke Prev loop recorder  Postop Dx  same/  Procedure:explant loop recorder   Local anesthesia was introduced over the medial aspect of the device pocket.  Incision was made and carried down to the layer of the device, the device was explanted.  A benzoin Steri-Strip dressing was applied. Instructions given  Steven Klein, MD 04/29/2022 9:10 AM      Current medicines are reviewed at length with the patient today .  The patient does not  have concerns regarding medicines.   

## 2022-04-29 NOTE — Patient Instructions (Addendum)
Medication Instructions:  Your physician has recommended you make the following change in your medication:  1) START taking HCTZ 12.5 mg daily  Labwork: None ordered.  Testing/Procedures: None ordered.  Follow-Up:  Your physician wants you to follow-up in: one year with Dr. Caryl Comes.  You will receive a reminder letter in the mail two months in advance. If you don't receive a letter, please call our office to schedule the follow-up appointment.    Implantable Loop Recorder Removal, Care After This sheet gives you information about how to care for yourself after your procedure. Your health care provider may also give you more specific instructions. If you have problems or questions, contact your health care provider. What can I expect after the procedure? After the procedure, it is common to have: Soreness or discomfort near the incision. Some swelling or bruising near the incision.  Follow these instructions at home: Incision care  Monitor your cardiac device site for redness, swelling, and drainage. Call the device clinic at [phone] if you experience these symptoms or fever/chills.  Keep the large square bandage on your site for 24 hours and then you may remove it yourself. Keep the steri-strips underneath in place.   You may shower after 72 hours / 3 days from your procedure with the steri-strips in place. They will usually fall off on their own, or may be removed after 10 days. Pat dry.   Avoid lotions, ointments, or perfumes over your incision until it is well-healed.  Please do not submerge in water until your site is completely healed.   If your wound site starts to bleed apply pressure.       If you have any questions/concerns please call the device clinic at [phone].  Activity  Return to your normal activities.  Contact a health care provider if: You have redness, swelling, or pain around your incision. You have a fever.

## 2022-05-02 NOTE — Progress Notes (Unsigned)
[place] at [facility] [address] [place], [place] [address] [phone]  Date:  05/04/2022   Name:  Phillip [name]   DOB:  1950-07-06   MRN:  [id number]  PCP:  Darreld Mclean, MD    Chief Complaint: No chief complaint on file.   History of Present Illness:  LELON IKARD is a 72 y.o. very pleasant male patient who presents with the following:  Patient seen today for periodic follow-up Most recent visit with myself was in July History of diabetes, sleep apnea on CPAP, diabetic neuropathy, CAD, migraine headache, ischemic stroke in 2014 with no residual   He had COVID in September, treated with Paxlovid His previous cardiologist has retired, he recently established care with Dr. Trinda Pascal looks like they plan to get his loop recorder out Reviewed monitor -- symptomatic but more worry engendering ectopy.  Dont think the atrial ectopy burden is sufficient to be a causal relation to afib, although at his age Afib is 1/10 or so. Continue his Bystolic   The VT nonsustained is old and predates his prior evaluation, so its prognostic concern was then minimal.   It is probably reasonable to repeat the echo now almost 5 yrs later   Will plan loop removal    BP is elevated-- but was 136 on repeat  continue current meds   CAD on statin, and plavix  continue Cryptogenic Stroke continue above   He has been doing some PT for back pain We changed him from Bydureon to [place] over the summer Lab Results  Component Value Date   HGBA1C 7.4 (H) 09/02/2021   Shingrix Urine microalbumin is due Can get COVID booster anytime A1c/can update other labs today  Patient Active Problem List   Diagnosis Date Noted   Atrial tachycardia 04/22/2022   Palpitations 02/18/2022   Severe obstructive sleep apnea-hypopnea syndrome 07/01/2020   Dependence on CPAP ventilation 07/01/2020   Type 2 diabetes mellitus with diabetic neuropathy, without  long-term current use of insulin ([place]) 09/02/2019   OSA and COPD overlap syndrome ([place]) 09/02/2019   Abnormal hemoglobin ([place]) 09/02/2019   OSA on CPAP 08/12/2015   Ventricular tachycardia ([place]) 11/10/2014   CAD (coronary artery disease), native coronary artery    Variants of migraine, not elsewhere classified, without mention of intractable migraine without mention of status migrainosus 08/14/2013   History of ischemic stroke without residual deficits 04/17/2013   ED (erectile dysfunction) 02/01/2012   Gout 09/05/2011   Hyperlipidemia 09/05/2011   Hypertriglyceridemia 09/05/2011   PVC (premature ventricular contraction) 09/05/2011   Type 2 diabetes, controlled, with neuropathy ([place]) 06/01/2011   Essential hypertension, benign 06/01/2011    Past Medical History:  Diagnosis Date   Anemia    Arthritis    Diabetes mellitus    Gout    Hyperlipidemia    Hypertension    Localized osteoarthritis of left knee 11/20/2012   Migraine headache with aura    Neuromuscular disorder ([place])    " mild periferal neauropathy   Osteoarthritis of left hip 11/20/2012   PVC (premature ventricular contraction)    Sleep apnea    cpap    Past Surgical History:  Procedure Laterality Date   CHOLECYSTECTOMY  1997   CVA     CVA, occiptal   CVA     CVA, occipital   knee arthoscopy  2006   Left Knee   LEFT HEART CATH AND CORONARY ANGIOGRAPHY N/A 12/07/2017  Procedure: LEFT HEART CATH AND CORONARY ANGIOGRAPHY;  Surgeon: Troy Sine, MD;  Location: [place] [facility];  Service: Cardiovascular;  Laterality: N/A;   left total hip     left unicarpmental knee     LOOP RECORDER IMPLANT N/A 04/19/2013   Procedure: LOOP RECORDER IMPLANT;  Surgeon: Evans Lance, MD;  Location: [facility] CATH LAB;  Service: Cardiovascular;  Laterality: N/A;   PARTIAL KNEE ARTHROPLASTY Left 11/20/2012   Procedure: UNICOMPARTMENTAL KNEE;  Surgeon: Johnny Bridge, MD;  Location: [place];  Service: Orthopedics;  Laterality: Left;    REPLACEMENT UNICONDYLAR JOINT KNEE Left 11/20/2012   Dr Mardelle Matte   TEE WITHOUT CARDIOVERSION N/A 04/19/2013   Procedure: TRANSESOPHAGEAL ECHOCARDIOGRAM (TEE);  Surgeon: Larey Dresser, MD;  Location: [place];  Service: Cardiovascular;  Laterality: N/A;   TONSILLECTOMY AND ADENOIDECTOMY     Childhood   TOTAL HIP ARTHROPLASTY Left 11/20/2012   Dr Mardelle Matte   TOTAL HIP ARTHROPLASTY Left 11/20/2012   Procedure: TOTAL HIP ARTHROPLASTY;  Surgeon: Johnny Bridge, MD;  Location: [place];  Service: Orthopedics;  Laterality: Left;    Social History   Tobacco Use   Smoking status: Never   Smokeless tobacco: Never  Substance Use Topics   Alcohol use: No    Alcohol/week: 0.0 standard drinks of alcohol   Drug use: No    Family History  Problem Relation Age of Onset   Cancer Mother        Breast   Stroke Mother    Stroke Maternal Grandmother    Stroke Maternal Aunt    Stroke Maternal Uncle     Allergies  Allergen Reactions   Sulfa Antibiotics Rash and Other (See Comments)    Reaction not confirmed, but a possibility (from childhood)    Medication list has been reviewed and updated.  Current Outpatient Medications on File Prior to Visit  Medication Sig Dispense Refill   acetic acid 2 % otic solution Place 4 drops into both ears 4 (four) times daily. 15 mL 1   Alcohol Swabs PADS Use to test up to 4 times daily 100 each 2   ALEVE 220 MG tablet Take 220-440 mg by mouth 2 (two) times daily as needed (for headaches).     allopurinol (ZYLOPRIM) 300 MG tablet TAKE 1 TABLET BY MOUTH DAILY 90 tablet 3   blood glucose meter kit and supplies KIT Pt needs One Touch Ultra glucometer.  Use up to BID to check blood sugars 1 each 0   clopidogrel (PLAVIX) 75 MG tablet Take 1 tablet (75 mg total) by mouth daily. 90 tablet 3   dapagliflozin propanediol (FARXIGA) 10 MG TABS tablet Take 1 tablet (10 mg total) by mouth daily before breakfast. 90 tablet 3   diclofenac sodium (VOLTAREN) 1 % GEL Apply 2-4 g  topically 2 (two) times daily as needed (for pain).     glucose blood (FREESTYLE LITE) test strip USE TO TEST UP TO 4 TIMES DAILY 100 each 12   hydrochlorothiazide (MICROZIDE) 12.5 MG capsule Take 1 capsule (12.5 mg total) by mouth daily. 90 capsule 3   insulin glargine (LANTUS SOLOSTAR) 100 UNIT/ML Solostar Pen ADMINISTER 30 UNITS UNDER THE SKIN DAILY 15 mL 5   Insulin Pen Needle (PEN NEEDLES) 32G X 5 MM MISC 1 each by Does not apply route daily. Use for basal insulin injection 100 each 6   losartan (COZAAR) 100 MG tablet TAKE 1 TABLET(100 MG) BY MOUTH DAILY 90 tablet 3   metFORMIN (  GLUCOPHAGE-XR) 500 MG 24 hr tablet TAKE 2 TABLETS(1000 MG) BY MOUTH TWICE DAILY 360 tablet 3   nebivolol (BYSTOLIC) 10 MG tablet TAKE 1 TABLET BY MOUTH EVERY EVENING. 90 tablet 3   rOPINIRole (REQUIP) 0.25 MG tablet START BY TAKING 1 TABLET BY MOUTH EVERY NIGHT AT BEDTIME AS NEEDED. CAN INCREASE TO 2 TABLETS BY MOUTH AFTER 2 DAYS 60 tablet 3   rosuvastatin (CRESTOR) 5 MG tablet TAKE 1 TABLET BY MOUTH EVERY MONDAY, WEDNESDAY, AND FRIDAY 39 tablet 2   Semaglutide, 1 MG/DOSE, 4 MG/3ML SOPN Inject 1 mg as directed once a week. 3 mL 3   silodosin (RAPAFLO) 8 MG CAPS capsule Take 8 mg by mouth daily.     SM LANCETS 33G MISC USE TO TEST BLOOD SUGAR TWICE DAILY 400 each PRN   tadalafil (CIALIS) 5 MG tablet Take 5 mg by mouth daily as needed for erectile dysfunction (for urinary dx).     zolpidem (AMBIEN) 5 MG tablet Take 1 tablet (5 mg total) by mouth at bedtime as needed for sleep. 30 tablet 1   No current facility-administered medications on file prior to visit.    Review of Systems:  As per HPI- otherwise negative.   Physical Examination: There were no vitals filed for this visit. There were no vitals filed for this visit. There is no height or weight on file to calculate BMI. Ideal Body Weight:    GEN: no acute distress. HEENT: Atraumatic, Normocephalic.  Ears and Nose: No external deformity. CV: RRR, No  M/G/R. No JVD. No thrill. No extra heart sounds. PULM: CTA B, no wheezes, crackles, rhonchi. No retractions. No resp. distress. No accessory muscle use. ABD: S, NT, ND, +BS. No rebound. No HSM. EXTR: No c/c/e PSYCH: Normally interactive. Conversant.    Assessment and Plan: ***  Signed Lamar Blinks, MD

## 2022-05-03 ENCOUNTER — Encounter: Payer: Self-pay | Admitting: Family Medicine

## 2022-05-03 ENCOUNTER — Encounter: Payer: Self-pay | Admitting: Physical Therapy

## 2022-05-03 ENCOUNTER — Ambulatory Visit: Payer: Medicare Other | Attending: Family Medicine | Admitting: Physical Therapy

## 2022-05-03 DIAGNOSIS — M79605 Pain in left leg: Secondary | ICD-10-CM | POA: Diagnosis not present

## 2022-05-03 DIAGNOSIS — M6281 Muscle weakness (generalized): Secondary | ICD-10-CM | POA: Insufficient documentation

## 2022-05-03 DIAGNOSIS — M5459 Other low back pain: Secondary | ICD-10-CM | POA: Diagnosis not present

## 2022-05-03 NOTE — Therapy (Signed)
OUTPATIENT PHYSICAL THERAPY THORACOLUMBAR TREATMENT    Patient Name: Phillip [name] MRN: [id number] DOB:October 08, 1950, 72 y.o., male Today's Date: 05/03/2022   PT End of Session - 05/03/22 0802     Visit Number 12    Date for PT Re-Evaluation 05/19/22    Authorization Type [employer] medicare    PT Start Time 0800    PT Stop Time 0848    PT Time Calculation (min) 48 min    Activity Tolerance Patient tolerated treatment well    Behavior During Therapy [facility] for tasks assessed/performed              Past Medical History:  Diagnosis Date   Anemia    Arthritis    Diabetes mellitus    Gout    Hyperlipidemia    Hypertension    Localized osteoarthritis of left knee 11/20/2012   Migraine headache with aura    Neuromuscular disorder ([place])    " mild periferal neauropathy   Osteoarthritis of left hip 11/20/2012   PVC (premature ventricular contraction)    Sleep apnea    cpap   Past Surgical History:  Procedure Laterality Date   CHOLECYSTECTOMY  1997   CVA     CVA, occiptal   CVA     CVA, occipital   knee arthoscopy  2006   Left Knee   LEFT HEART CATH AND CORONARY ANGIOGRAPHY N/A 12/07/2017   Procedure: LEFT HEART CATH AND CORONARY ANGIOGRAPHY;  Surgeon: Troy Sine, MD;  Location: [place] CV LAB;  Service: Cardiovascular;  Laterality: N/A;   left total hip     left unicarpmental knee     LOOP RECORDER IMPLANT N/A 04/19/2013   Procedure: LOOP RECORDER IMPLANT;  Surgeon: Evans Lance, MD;  Location: [facility] CATH LAB;  Service: Cardiovascular;  Laterality: N/A;   PARTIAL KNEE ARTHROPLASTY Left 11/20/2012   Procedure: UNICOMPARTMENTAL KNEE;  Surgeon: Johnny Bridge, MD;  Location: [place];  Service: Orthopedics;  Laterality: Left;   REPLACEMENT UNICONDYLAR JOINT KNEE Left 11/20/2012   Dr Mardelle Matte   TEE WITHOUT CARDIOVERSION N/A 04/19/2013   Procedure: TRANSESOPHAGEAL ECHOCARDIOGRAM (TEE);  Surgeon: Larey Dresser, MD;  Location: [place];  Service: Cardiovascular;  Laterality:  N/A;   TONSILLECTOMY AND ADENOIDECTOMY     Childhood   TOTAL HIP ARTHROPLASTY Left 11/20/2012   Dr Mardelle Matte   TOTAL HIP ARTHROPLASTY Left 11/20/2012   Procedure: TOTAL HIP ARTHROPLASTY;  Surgeon: Johnny Bridge, MD;  Location: [place];  Service: Orthopedics;  Laterality: Left;   Patient Active Problem List   Diagnosis Date Noted   Atrial tachycardia 04/22/2022   Palpitations 02/18/2022   Severe obstructive sleep apnea-hypopnea syndrome 07/01/2020   Dependence on CPAP ventilation 07/01/2020   Type 2 diabetes mellitus with diabetic neuropathy, without long-term current use of insulin ([place]) 09/02/2019   OSA and COPD overlap syndrome ([place]) 09/02/2019   Abnormal hemoglobin (H[facility]) 09/02/2019   OSA on CPAP 08/12/2015   Ventricular tachycardia ([place]) 11/10/2014   CAD (coronary artery disease), native coronary artery    Variants of migraine, not elsewhere classified, without mention of intractable migraine without mention of status migrainosus 08/14/2013   History of ischemic stroke without residual deficits 04/17/2013   ED (erectile dysfunction) 02/01/2012   Gout 09/05/2011   Hyperlipidemia 09/05/2011   Hypertriglyceridemia 09/05/2011   PVC (premature ventricular contraction) 09/05/2011   Type 2 diabetes, controlled, with neuropathy ([place]) 06/01/2011   Essential hypertension, benign 06/01/2011    PCP: [place]  REFERRING PROVIDER: Mardelle Matte  REFERRING DIAG: LBP, left leg pain and weakness  Rationale for Evaluation and Treatment Rehabilitation  THERAPY DIAG:  Other low back pain  Pain in left leg  Muscle weakness (generalized)  ONSET DATE: August 2023  SUBJECTIVE:                                                                                                                                                                                           SUBJECTIVE STATEMENT: Stiff int he morning, arm and leg hurt a little, lifting grandbabies over the past week.  Has been able to lift  grandkids without much problem  PERTINENT HISTORY:  See above  PAIN:  Are you having pain? Yes: NPRS scale: 2/10 Pain location: left heel Pain description: pain and stiffness Aggravating factors: early morning, squatting, get up after sitting pain up to 6-7/10 Relieving factors: rest, Tylenol, lie down, can be 1/10    FALLS:  Has patient fallen in last 6 months? No  LIVING ENVIRONMENT: Lives with: lives with their family Lives in: House/apartment Stairs: Yes: Internal: 16 steps; can reach both Has following equipment at home: None  OCCUPATION: some work with hospice, he is an MD, does travel over seas often  PLOF: Independent and golf 1xweek, some walking up to 2 miles a few times a week  PATIENT GOALS: get up and down from the floor, less pain, stronger, get up easier   OBJECTIVE:   DIAGNOSTIC FINDINGS:  X-rays scoliosis and degenerative changes  PATIENT SURVEYS:  FOTO 55  COGNITION:  Overall cognitive status: Within functional limits for tasks assessed     SENSATION: WFL  MUSCLE LENGTH: Very tight HS 45 degrees with pain mostly left posterior knee Tight calves, mild tightness in the piriformis  POSTURE: rounded shoulders and forward head  PALPATION: Tight in the lumbar area, non tender  LUMBAR ROM:   AROM eval  Flexion Decreased 50%  Extension 50%  Right lateral flexion Decreased 75%  Left lateral flexion Decreased 75 %  Right rotation   Left rotation    (Blank rows = not tested)  LOWER EXTREMITY ROM:      LOWER EXTREMITY MMT:    MMT Right eval Left eval  Hip flexion 4+ 4-  Hip extension    Hip abduction 4+ 4-  Hip adduction    Hip internal rotation    Hip external rotation    Knee flexion    Knee extension 4+ 4-  Ankle dorsiflexion    Ankle plantarflexion    Ankle inversion    Ankle eversion     (Blank rows = not tested)  FUNCTIONAL TESTS:  5 times  sit to stand: 12 with decreased control Timed up and go (TUG): 12 seconds  slight off balance with turn  GAIT: Distance walked: some ataxic LE motions, does have neuropathy Assistive device utilized: None Level of assistance: Complete Independence Comments: unsteady turns, does stairs one at a time up and down due to feeling like the left leg will not hold him    TODAY'S TREATMENT:  05/03/22 Nustep level 5 x 6 minutes Seated rows 20# 2x10 Lats 20# 2x10 Pball roll outs Feet on ball K2C, trunk rotation posterior and abdominal isometrics Passive stretch LE's Left leg side step ups with 4" step and only heel touches Lumbar traction 90# static   04/27/22 NuStep L5 x 6 min Pball roll outs Passive stretch of the LE's Feet on ball K2C, trunk rotation, small bridge and isometric abs Lumbar traction 90# static   04/19/22 Nustep Level 5 x 6 minutes Pball roll outs Passive stretch of the LE's Feet on ball K2C, trunk rotation, small bridge and isometric abs Lumbar traction 80# static  04/01/22 Bike L 3.5 x 6 min Resisted gait 40lb 4 way x3 each  Calf stretch Leg Press 40lb 2x12 Stairs 6 in x10 two rails alternating pattern Lumbar traction 100# x 12 minutes.    04/06/22 NuStep L5 x 6 min Calf stretch  Hip Ext 5lb x10 Leg Press 20lb 2x10 Lumbar stretch with physioball, forward, L, R. S2S Elevated surface holding blue ball x10, without ball x 10 due to it bothering his sore arm Seated trunk rotations yellow ball Lumbar traction 100# x 12 minutes.  PATIENT EDUCATION:  Education details: HEP and POC Person educated: Patient Education method: Consulting civil engineer, Media planner, Verbal cues, and Handouts Education comprehension: verbalized understanding   HOME EXERCISE PROGRAM: Access Code: [email or web address] URL: [email or web address] Date: 02/16/2022 Prepared by: Lum Babe  ASSESSMENT:  CLINICAL IMPRESSION: Pt reports overall doing well, less back pain, still very stiff in the morning.  He does have some c/o weakness in the left leg when  going up and down stairs, , reports feels like the leg is not stable on the left OBJECTIVE IMPAIRMENTS: Abnormal gait, decreased balance, decreased mobility, difficulty walking, decreased ROM, decreased strength, increased muscle spasms, impaired flexibility, improper body mechanics, and pain.   REHAB POTENTIAL: Good  CLINICAL DECISION MAKING: Stable/uncomplicated  EVALUATION COMPLEXITY: Low   GOALS: Goals reviewed with patient? Yes  SHORT TERM GOALS: Target date: 03/02/22  Independent with initial HEP Goal status: met   LONG TERM GOALS: Target date: 05/11/22  Understand posture and body mechanics Goal status:partially met  2.  Go up and down stairs step over step Goal status: progressing  3.  Get up off the floor to play with grandkids Goal status: ongoing  04/12/22  grand kids has not been over   4.  Increase left LE stregnth to 4+/5 Goal status:partially met  5.  Squat to reach in the oven without difficulty Goal status: ongoing. 04/12/22 have not tried   PLAN: PT FREQUENCY: 1x/week  PT DURATION: 12 weeks  PLANNED INTERVENTIONS: Therapeutic exercises, Therapeutic activity, Neuromuscular re-education, Balance training, Gait training, Patient/Family education, Self Care, Joint mobilization, Stair training, Dry Needling, Electrical stimulation, Moist heat, Taping, Traction, and Manual therapy.  PLAN FOR NEXT SESSION:See how he is doing, he has family here for the next month  Lum Babe, PT 05/03/22 8:05 AM  05/03/22 8:05 AM

## 2022-05-04 ENCOUNTER — Ambulatory Visit (INDEPENDENT_AMBULATORY_CARE_PROVIDER_SITE_OTHER): Payer: Medicare Other | Admitting: Family Medicine

## 2022-05-04 VITALS — BP 124/72 | HR 76 | Temp 97.6°F | Resp 18 | Ht 72.0 in | Wt 223.0 lb

## 2022-05-04 DIAGNOSIS — I10 Essential (primary) hypertension: Secondary | ICD-10-CM

## 2022-05-04 DIAGNOSIS — M2550 Pain in unspecified joint: Secondary | ICD-10-CM | POA: Diagnosis not present

## 2022-05-04 DIAGNOSIS — Z125 Encounter for screening for malignant neoplasm of prostate: Secondary | ICD-10-CM

## 2022-05-04 DIAGNOSIS — I2510 Atherosclerotic heart disease of native coronary artery without angina pectoris: Secondary | ICD-10-CM

## 2022-05-04 DIAGNOSIS — E1140 Type 2 diabetes mellitus with diabetic neuropathy, unspecified: Secondary | ICD-10-CM | POA: Diagnosis not present

## 2022-05-04 DIAGNOSIS — G4733 Obstructive sleep apnea (adult) (pediatric): Secondary | ICD-10-CM | POA: Diagnosis not present

## 2022-05-04 DIAGNOSIS — Z8673 Personal history of transient ischemic attack (TIA), and cerebral infarction without residual deficits: Secondary | ICD-10-CM | POA: Diagnosis not present

## 2022-05-04 DIAGNOSIS — G4700 Insomnia, unspecified: Secondary | ICD-10-CM

## 2022-05-04 DIAGNOSIS — N400 Enlarged prostate without lower urinary tract symptoms: Secondary | ICD-10-CM

## 2022-05-04 MED ORDER — SEMAGLUTIDE (2 MG/DOSE) 8 MG/3ML ~~LOC~~ SOPN: 2.0000 mg | PEN_INJECTOR | SUBCUTANEOUS | 3 refills | Status: DC

## 2022-05-04 MED ORDER — ZOLPIDEM TARTRATE 5 MG PO TABS: 5.0000 mg | ORAL_TABLET | Freq: Every evening | ORAL | 1 refills | Status: DC | PRN

## 2022-05-04 MED ORDER — TADALAFIL 5 MG PO TABS: 2.5000 mg | ORAL_TABLET | Freq: Every day | ORAL | 3 refills | Status: DC | PRN

## 2022-05-05 ENCOUNTER — Encounter: Payer: Self-pay | Admitting: Family Medicine

## 2022-05-05 LAB — CBC
HCT: 43.5 % (ref 39.0–52.0)
Hemoglobin: 14.6 g/dL (ref 13.0–17.0)
MCHC: 33.6 g/dL (ref 30.0–36.0)
MCV: 92.7 fl (ref 78.0–100.0)
Platelets: 278 10*3/uL (ref 150.0–400.0)
RBC: 4.7 Mil/uL (ref 4.22–5.81)
RDW: 13.2 % (ref 11.5–15.5)
WBC: 6 10*3/uL (ref 4.0–10.5)

## 2022-05-05 LAB — COMPREHENSIVE METABOLIC PANEL
ALT: 18 U/L (ref 0–53)
AST: 14 U/L (ref 0–37)
Albumin: 4.4 g/dL (ref 3.5–5.2)
Alkaline Phosphatase: 65 U/L (ref 39–117)
BUN: 22 mg/dL (ref 6–23)
CO2: 28 mEq/L (ref 19–32)
Calcium: 10.1 mg/dL (ref 8.4–10.5)
Chloride: 97 mEq/L (ref 96–112)
Creatinine, Ser: 0.97 mg/dL (ref 0.40–1.50)
GFR: 77.56 mL/min (ref >60.00)
Glucose, Bld: 299 mg/dL — ABNORMAL HIGH (ref 70–99)
Potassium: 4.2 mEq/L (ref 3.5–5.1)
Sodium: 135 mEq/L (ref 135–145)
Total Bilirubin: 0.6 mg/dL (ref 0.2–1.2)
Total Protein: 6.6 g/dL (ref 6.0–8.3)

## 2022-05-05 LAB — SEDIMENTATION RATE: Sed Rate: 9 mm/hr (ref 0–20)

## 2022-05-05 LAB — MICROALBUMIN / CREATININE URINE RATIO
Creatinine,U: 41.2 mg/dL
Microalb Creat Ratio: 1.7 mg/g (ref 0.0–30.0)
Microalb, Ur: <0.7 mg/dL (ref 0.0–1.9)

## 2022-05-05 LAB — LIPID PANEL
Cholesterol: 126 mg/dL (ref 0–200)
HDL: 46.6 mg/dL (ref >39.00)
NonHDL: 79.52
Total CHOL/HDL Ratio: 3
Triglycerides: 294 mg/dL — ABNORMAL HIGH (ref 0.0–149.0)
VLDL: 58.8 mg/dL — ABNORMAL HIGH (ref 0.0–40.0)

## 2022-05-05 LAB — PSA: PSA: 2.45 ng/mL (ref 0.10–4.00)

## 2022-05-05 LAB — HEMOGLOBIN A1C: Hgb A1c MFr Bld: 8.1 % — ABNORMAL HIGH (ref 4.6–6.5)

## 2022-05-05 LAB — LDL CHOLESTEROL, DIRECT: Direct LDL: 48 mg/dL

## 2022-05-05 LAB — URIC ACID: Uric Acid, Serum: 4.1 mg/dL (ref 4.0–7.8)

## 2022-05-05 LAB — C-REACTIVE PROTEIN: CRP: <1 mg/dL (ref 0.5–20.0)

## 2022-05-06 LAB — RHEUMATOID FACTOR: Rheumatoid fact SerPl-aCnc: <14 IU/mL (ref <14)

## 2022-05-06 LAB — ANA: Anti Nuclear Antibody (ANA): NEGATIVE

## 2022-05-09 ENCOUNTER — Other Ambulatory Visit: Payer: Self-pay | Admitting: Family Medicine

## 2022-05-09 DIAGNOSIS — N400 Enlarged prostate without lower urinary tract symptoms: Secondary | ICD-10-CM

## 2022-05-09 MED ORDER — TADALAFIL 5 MG PO TABS: 2.5000 mg | ORAL_TABLET | Freq: Every day | ORAL | 3 refills | Status: AC | PRN

## 2022-05-10 ENCOUNTER — Ambulatory Visit: Payer: Medicare Other | Admitting: Physical Therapy

## 2022-05-10 ENCOUNTER — Encounter: Payer: Self-pay | Admitting: Physical Therapy

## 2022-05-10 DIAGNOSIS — M79605 Pain in left leg: Secondary | ICD-10-CM | POA: Diagnosis not present

## 2022-05-10 DIAGNOSIS — M6281 Muscle weakness (generalized): Secondary | ICD-10-CM

## 2022-05-10 DIAGNOSIS — M5459 Other low back pain: Secondary | ICD-10-CM | POA: Diagnosis not present

## 2022-05-10 NOTE — Therapy (Signed)
OUTPATIENT PHYSICAL THERAPY THORACOLUMBAR TREATMENT    Patient Name: Phillip [name] MRN: [id number] DOB:[birth year], 72 y.o., male Today's Date: 05/10/2022   PT End of Session - 05/10/22 0847     Visit Number 13    Date for PT Re-Evaluation 05/19/22    PT Start Time 0847    PT Stop Time 0930    PT Time Calculation (min) 43 min    Activity Tolerance Patient tolerated treatment well    Behavior During Therapy [facility] for tasks assessed/performed              Past Medical History:  Diagnosis Date   Anemia    Arthritis    Diabetes mellitus    Gout    Hyperlipidemia    Hypertension    Localized osteoarthritis of left knee 11/20/2012   Migraine headache with aura    Neuromuscular disorder ([place])    " mild periferal neauropathy   Osteoarthritis of left hip 11/20/2012   PVC (premature ventricular contraction)    Sleep apnea    cpap   Past Surgical History:  Procedure Laterality Date   CHOLECYSTECTOMY  1997   CVA     CVA, occiptal   CVA     CVA, occipital   knee arthoscopy  2006   Left Knee   LEFT HEART CATH AND CORONARY ANGIOGRAPHY N/A 12/07/2017   Procedure: LEFT HEART CATH AND CORONARY ANGIOGRAPHY;  Surgeon: Troy Sine, MD;  Location: [place] CV LAB;  Service: Cardiovascular;  Laterality: N/A;   left total hip     left unicarpmental knee     LOOP RECORDER IMPLANT N/A 04/19/2013   Procedure: LOOP RECORDER IMPLANT;  Surgeon: Evans Lance, MD;  Location: [facility] CATH LAB;  Service: Cardiovascular;  Laterality: N/A;   PARTIAL KNEE ARTHROPLASTY Left 11/20/2012   Procedure: UNICOMPARTMENTAL KNEE;  Surgeon: Johnny Bridge, MD;  Location: [place];  Service: Orthopedics;  Laterality: Left;   REPLACEMENT UNICONDYLAR JOINT KNEE Left 11/20/2012   Dr Mardelle Matte   TEE WITHOUT CARDIOVERSION N/A 04/19/2013   Procedure: TRANSESOPHAGEAL ECHOCARDIOGRAM (TEE);  Surgeon: Larey Dresser, MD;  Location: [place];  Service: Cardiovascular;  Laterality: N/A;   TONSILLECTOMY AND ADENOIDECTOMY      Childhood   TOTAL HIP ARTHROPLASTY Left 11/20/2012   Dr Mardelle Matte   TOTAL HIP ARTHROPLASTY Left 11/20/2012   Procedure: TOTAL HIP ARTHROPLASTY;  Surgeon: Johnny Bridge, MD;  Location: [place];  Service: Orthopedics;  Laterality: Left;   Patient Active Problem List   Diagnosis Date Noted   Atrial tachycardia 04/22/2022   Palpitations 02/18/2022   Severe obstructive sleep apnea-hypopnea syndrome 07/01/2020   Dependence on CPAP ventilation 07/01/2020   Type 2 diabetes mellitus with diabetic neuropathy, without long-term current use of insulin ([place]) 09/02/2019   OSA and COPD overlap syndrome ([place]) 09/02/2019   Abnormal hemoglobin ([facility]) 09/02/2019   OSA on CPAP 08/12/2015   Ventricular tachycardia ([place]) 11/10/2014   CAD (coronary artery disease), native coronary artery    Variants of migraine, not elsewhere classified, without mention of intractable migraine without mention of status migrainosus 08/14/2013   History of ischemic stroke without residual deficits 04/17/2013   ED (erectile dysfunction) 02/01/2012   Gout 09/05/2011   Hyperlipidemia 09/05/2011   Hypertriglyceridemia 09/05/2011   PVC (premature ventricular contraction) 09/05/2011   Type 2 diabetes, controlled, with neuropathy ([place]) 06/01/2011   Essential hypertension, benign 06/01/2011    PCP: [place]  REFERRING PROVIDER: Mardelle Matte  REFERRING DIAG: LBP, left leg pain and  weakness  Rationale for Evaluation and Treatment Rehabilitation  THERAPY DIAG:  Other low back pain  Muscle weakness (generalized)  Pain in left leg  ONSET DATE: August 2023  SUBJECTIVE:                                                                                                                                                                                           SUBJECTIVE STATEMENT: "Fair"  PERTINENT HISTORY:  See above  PAIN:  Are you having pain? Yes: NPRS scale: 2/10 Pain location: left heel Pain description: pain and  stiffness Aggravating factors: early morning, squatting, get up after sitting pain up to 6-7/10 Relieving factors: rest, Tylenol, lie down, can be 1/10    FALLS:  Has patient fallen in last 6 months? No  LIVING ENVIRONMENT: Lives with: lives with their family Lives in: House/apartment Stairs: Yes: Internal: 16 steps; can reach both Has following equipment at home: None  OCCUPATION: some work with hospice, he is an MD, does travel over seas often  PLOF: Independent and golf 1xweek, some walking up to 2 miles a few times a week  PATIENT GOALS: get up and down from the floor, less pain, stronger, get up easier   OBJECTIVE:   DIAGNOSTIC FINDINGS:  X-rays scoliosis and degenerative changes  PATIENT SURVEYS:  FOTO 55  COGNITION:  Overall cognitive status: Within functional limits for tasks assessed     SENSATION: WFL  MUSCLE LENGTH: Very tight HS 45 degrees with pain mostly left posterior knee Tight calves, mild tightness in the piriformis  POSTURE: rounded shoulders and forward head  PALPATION: Tight in the lumbar area, non tender  LUMBAR ROM:   AROM eval  Flexion Decreased 50%  Extension 50%  Right lateral flexion Decreased 75%  Left lateral flexion Decreased 75 %  Right rotation   Left rotation    (Blank rows = not tested)  LOWER EXTREMITY ROM:      LOWER EXTREMITY MMT:    MMT Right eval Left eval  Hip flexion 4+ 4-  Hip extension    Hip abduction 4+ 4-  Hip adduction    Hip internal rotation    Hip external rotation    Knee flexion    Knee extension 4+ 4-  Ankle dorsiflexion    Ankle plantarflexion    Ankle inversion    Ankle eversion     (Blank rows = not tested)  FUNCTIONAL TESTS:  5 times sit to stand: 12 with decreased control Timed up and go (TUG): 12 seconds slight off balance with turn  GAIT: Distance walked: some ataxic LE motions, does have neuropathy Assistive device  utilized: None Level of assistance: Complete  Independence Comments: unsteady turns, does stairs one at a time up and down due to feeling like the left leg will not hold him    TODAY'S TREATMENT:  05/10/22 NuStep L 5 x 6 min Lateral 4 in step ups  Seated rows 25# 2x10 Lats 20# 2x10 Shoulder Ext 10lb 2x10  Lumbar traction 90# static   05/03/22 Nustep level 5 x 6 minutes Seated rows 20# 2x10 Lats 20# 2x10 Pball roll outs Feet on ball K2C, trunk rotation posterior and abdominal isometrics Passive stretch LE's Left leg side step ups with 4" step and only heel touches Lumbar traction 90# static   04/27/22 NuStep L5 x 6 min Pball roll outs Passive stretch of the LE's Feet on ball K2C, trunk rotation, small bridge and isometric abs Lumbar traction 90# static   04/19/22 Nustep Level 5 x 6 minutes Pball roll outs Passive stretch of the LE's Feet on ball K2C, trunk rotation, small bridge and isometric abs Lumbar traction 80# static  04/01/22 Bike L 3.5 x 6 min Resisted gait 40lb 4 way x3 each  Calf stretch Leg Press 40lb 2x12 Stairs 6 in x10 two rails alternating pattern Lumbar traction 100# x 12 minutes.    04/06/22 NuStep L5 x 6 min Calf stretch  Hip Ext 5lb x10 Leg Press 20lb 2x10 Lumbar stretch with physioball, forward, L, R. S2S Elevated surface holding blue ball x10, without ball x 10 due to it bothering his sore arm Seated trunk rotations yellow ball Lumbar traction 100# x 12 minutes.  PATIENT EDUCATION:  Education details: HEP and POC Person educated: Patient Education method: Consulting civil engineer, Media planner, Verbal cues, and Handouts Education comprehension: verbalized understanding   HOME EXERCISE PROGRAM: Access Code: [email or web address] URL: [email or web address] Date: 02/16/2022 Prepared by: Lum Babe  ASSESSMENT:  CLINICAL IMPRESSION: Pt reports overall doing well, less back pain, still very stiff in the morning. He has been sleeping on a I mattress recently.  He does have some c/o  weakness in the left leg with side steps.  Continued with traction.   OBJECTIVE IMPAIRMENTS: Abnormal gait, decreased balance, decreased mobility, difficulty walking, decreased ROM, decreased strength, increased muscle spasms, impaired flexibility, improper body mechanics, and pain.   REHAB POTENTIAL: Good  CLINICAL DECISION MAKING: Stable/uncomplicated  EVALUATION COMPLEXITY: Low   GOALS: Goals reviewed with patient? Yes  SHORT TERM GOALS: Target date: 03/02/22  Independent with initial HEP Goal status: met   LONG TERM GOALS: Target date: 05/11/22  Understand posture and body mechanics Goal status:partially met  2.  Go up and down stairs step over step Goal status: progressing  3.  Get up off the floor to play with grandkids Goal status: ongoing  04/12/22  grand kids has not been over   4.  Increase left LE stregnth to 4+/5 Goal status:partially met  5.  Squat to reach in the oven without difficulty Goal status: ongoing. 04/12/22 have not tried   PLAN: PT FREQUENCY: 1x/week  PT DURATION: 12 weeks  PLANNED INTERVENTIONS: Therapeutic exercises, Therapeutic activity, Neuromuscular re-education, Balance training, Gait training, Patient/Family education, Self Care, Joint mobilization, Stair training, Dry Needling, Electrical stimulation, Moist heat, Taping, Traction, and Manual therapy.  PLAN FOR NEXT SESSION:See how he is doing, he has family here for the next month  Lum Babe, PT 05/10/22 8:47 AM  05/10/22 8:47 AM

## 2022-05-17 ENCOUNTER — Ambulatory Visit: Payer: Medicare Other | Admitting: Physical Therapy

## 2022-05-27 ENCOUNTER — Ambulatory Visit: Payer: Medicare Other | Admitting: Physical Therapy

## 2022-05-27 DIAGNOSIS — M79605 Pain in left leg: Secondary | ICD-10-CM

## 2022-05-27 DIAGNOSIS — M5459 Other low back pain: Secondary | ICD-10-CM

## 2022-05-27 DIAGNOSIS — M6281 Muscle weakness (generalized): Secondary | ICD-10-CM | POA: Diagnosis not present

## 2022-05-27 NOTE — Therapy (Signed)
OUTPATIENT PHYSICAL THERAPY THORACOLUMBAR DISCHARGE SUMMARY    Patient Name: Phillip [name] MRN: [id number] DOB:29-May-1950, 72 y.o., male Today's Date: 05/27/2022   PT End of Session - 05/27/22 1058     Visit Number 14    Date for PT Re-Evaluation 05/19/22    Authorization Type [employer] medicare    PT Start Time 1100    PT Stop Time 3662    PT Time Calculation (min) 45 min              Past Medical History:  Diagnosis Date   Anemia    Arthritis    Diabetes mellitus    Gout    Hyperlipidemia    Hypertension    Localized osteoarthritis of left knee 11/20/2012   Migraine headache with aura    Neuromuscular disorder ([place])    " mild periferal neauropathy   Osteoarthritis of left hip 11/20/2012   PVC (premature ventricular contraction)    Sleep apnea    cpap   Past Surgical History:  Procedure Laterality Date   CHOLECYSTECTOMY  1997   CVA     CVA, occiptal   CVA     CVA, occipital   knee arthoscopy  2006   Left Knee   LEFT HEART CATH AND CORONARY ANGIOGRAPHY N/A 12/07/2017   Procedure: LEFT HEART CATH AND CORONARY ANGIOGRAPHY;  Surgeon: Troy Sine, MD;  Location: [place] [facility];  Service: Cardiovascular;  Laterality: N/A;   left total hip     left unicarpmental knee     LOOP RECORDER IMPLANT N/A 04/19/2013   Procedure: LOOP RECORDER IMPLANT;  Surgeon: Evans Lance, MD;  Location: [facility];  Service: Cardiovascular;  Laterality: N/A;   PARTIAL KNEE ARTHROPLASTY Left 11/20/2012   Procedure: UNICOMPARTMENTAL KNEE;  Surgeon: Johnny Bridge, MD;  Location: [place];  Service: Orthopedics;  Laterality: Left;   REPLACEMENT UNICONDYLAR JOINT KNEE Left 11/20/2012   Dr Mardelle Matte   TEE WITHOUT CARDIOVERSION N/A 04/19/2013   Procedure: TRANSESOPHAGEAL ECHOCARDIOGRAM (TEE);  Surgeon: Larey Dresser, MD;  Location: [place];  Service: Cardiovascular;  Laterality: N/A;   TONSILLECTOMY AND ADENOIDECTOMY     Childhood   TOTAL HIP ARTHROPLASTY Left 11/20/2012   Dr Mardelle Matte    TOTAL HIP ARTHROPLASTY Left 11/20/2012   Procedure: TOTAL HIP ARTHROPLASTY;  Surgeon: Johnny Bridge, MD;  Location: [place];  Service: Orthopedics;  Laterality: Left;   Patient Active Problem List   Diagnosis Date Noted   Atrial tachycardia 04/22/2022   Palpitations 02/18/2022   Severe obstructive sleep apnea-hypopnea syndrome 07/01/2020   Dependence on CPAP ventilation 07/01/2020   Type 2 diabetes mellitus with diabetic neuropathy, without long-term current use of insulin ([place]) 09/02/2019   OSA and COPD overlap syndrome ([place]) 09/02/2019   Abnormal hemoglobin ([facility]) 09/02/2019   OSA on CPAP 08/12/2015   Ventricular tachycardia ([place]) 11/10/2014   CAD (coronary artery disease), native coronary artery    Variants of migraine, not elsewhere classified, without mention of intractable migraine without mention of status migrainosus 08/14/2013   History of ischemic stroke without residual deficits 04/17/2013   ED (erectile dysfunction) 02/01/2012   Gout 09/05/2011   Hyperlipidemia 09/05/2011   Hypertriglyceridemia 09/05/2011   PVC (premature ventricular contraction) 09/05/2011   Type 2 diabetes, controlled, with neuropathy ([place]) 06/01/2011   Essential hypertension, benign 06/01/2011    PCP: [place]  REFERRING PROVIDER: Mardelle Matte  REFERRING DIAG: LBP, left leg pain and weakness  Rationale for Evaluation and Treatment Rehabilitation  THERAPY DIAG:  Other low back pain  Muscle weakness (generalized)  Pain in left leg  ONSET DATE: August 2023  SUBJECTIVE:                                                                                                                                                                                           SUBJECTIVE STATEMENT: Overall back is good, knee is okay, plantar fascia is an issue. Joined Y and think I can work out on own  PERTINENT HISTORY:  See above  PAIN:  Are you having pain? Yes: NPRS scale: 2/10 Pain location: left heel Pain  description: pain and stiffness Aggravating factors: early morning, squatting, get up after sitting pain up to 6-7/10 Relieving factors: rest, Tylenol, lie down, can be 1/10    FALLS:  Has patient fallen in last 6 months? No  LIVING ENVIRONMENT: Lives with: lives with their family Lives in: House/apartment Stairs: Yes: Internal: 16 steps; can reach both Has following equipment at home: None  OCCUPATION: some work with hospice, he is an MD, does travel over seas often  PLOF: Independent and golf 1xweek, some walking up to 2 miles a few times a week  PATIENT GOALS: get up and down from the floor, less pain, stronger, get up easier   OBJECTIVE:   DIAGNOSTIC FINDINGS:  X-rays scoliosis and degenerative changes  PATIENT SURVEYS:  FOTO 55  COGNITION:  Overall cognitive status: Within functional limits for tasks assessed     SENSATION: WFL  MUSCLE LENGTH: Very tight HS 45 degrees with pain mostly left posterior knee Tight calves, mild tightness in the piriformis  POSTURE: rounded shoulders and forward head  PALPATION: Tight in the lumbar area, non tender  LUMBAR ROM:   AROM eval  Flexion Decreased 50%  Extension 50%  Right lateral flexion Decreased 75%  Left lateral flexion Decreased 75 %  Right rotation   Left rotation    (Blank rows = not tested)  LOWER EXTREMITY ROM:      LOWER EXTREMITY MMT:    MMT Right eval Left eval  Hip flexion 4+ 4-  Hip extension    Hip abduction 4+ 4-  Hip adduction    Hip internal rotation    Hip external rotation    Knee flexion    Knee extension 4+ 4-  Ankle dorsiflexion    Ankle plantarflexion    Ankle inversion    Ankle eversion     (Blank rows = not tested)  FUNCTIONAL TESTS:  5 times sit to stand: 12 with decreased control Timed up and go (TUG): 12 seconds slight off balance with turn  GAIT: Distance walked:  some ataxic LE motions, does have neuropathy Assistive device utilized: None Level of assistance:  Complete Independence Comments: unsteady turns, does stairs one at a time up and down due to feeling like the left leg will not hold him    TODAY'S TREATMENT:   05/27/22 Nustep L 5 74mo Seated row and lat pull down 25# 2 sets 10 Leg Press 40# 3 sets 10 feet 3 way for hip strength Trunk flex and ext black tband 20 x Knee ext  2 sets 10 15# HS curl 2 sets 10 25# Lumbar traction 90# static    05/10/22 NuStep L 5 x 6 min Lateral 4 in step ups  Seated rows 25# 2x10 Lats 20# 2x10 Shoulder Ext 10lb 2x10  Lumbar traction 90# static   05/03/22 Nustep level 5 x 6 minutes Seated rows 20# 2x10 Lats 20# 2x10 Pball roll outs Feet on ball K2C, trunk rotation posterior and abdominal isometrics Passive stretch LE's Left leg side step ups with 4" step and only heel touches Lumbar traction 90# static   04/27/22 NuStep L5 x 6 min Pball roll outs Passive stretch of the LE's Feet on ball K2C, trunk rotation, small bridge and isometric abs Lumbar traction 90# static   04/19/22 Nustep Level 5 x 6 minutes Pball roll outs Passive stretch of the LE's Feet on ball K2C, trunk rotation, small bridge and isometric abs Lumbar traction 80# static  04/01/22 Bike L 3.5 x 6 min Resisted gait 40lb 4 way x3 each  Calf stretch Leg Press 40lb 2x12 Stairs 6 in x10 two rails alternating pattern Lumbar traction 100# x 12 minutes.    04/06/22 NuStep L5 x 6 min Calf stretch  Hip Ext 5lb x10 Leg Press 20lb 2x10 Lumbar stretch with physioball, forward, L, R. S2S Elevated surface holding blue ball x10, without ball x 10 due to it bothering his sore arm Seated trunk rotations yellow ball Lumbar traction 100# x 12 minutes.  PATIENT EDUCATION:  Education details: HEP and POC Person educated: Patient Education method: EConsulting civil engineer DMedia planner Verbal cues, and Handouts Education comprehension: verbalized understanding   HOME EXERCISE PROGRAM: Access Code: [email or web address] URL:  [email or web address] Date: 02/16/2022 Prepared by: MLum Babe ASSESSMENT:  CLINICAL IMPRESSION: Pt states overall back is doing well. Left leg stronger but dealing with plantar fascitis. Pt states joined gym and will try the Y- educ on gym safety and progress OBJECTIVE IMPAIRMENTS: Abnormal gait, decreased balance, decreased mobility, difficulty walking, decreased ROM, decreased strength, increased muscle spasms, impaired flexibility, improper body mechanics, and pain.   REHAB POTENTIAL: Good  CLINICAL DECISION MAKING: Stable/uncomplicated  EVALUATION COMPLEXITY: Low   GOALS: Goals reviewed with patient? Yes  SHORT TERM GOALS: Target date: 03/02/22  Independent with initial HEP Goal status: met   LONG TERM GOALS: Target date: 05/11/22  Understand posture and body mechanics Goal status:partially met  2.  Go up and down stairs step over step Goal status: MET 05/27/22  3.  Get up off the floor to play with grandkids Goal status: ongoing  04/12/22  grand kids has not been over 05/27/22 still very difficult btwn back and left artifical knee  4.  Increase left LE stregnth to 4+/5 Goal status:partially met 05/27/22  5.  Squat to reach in the oven without difficulty Goal status: ongoing. 04/12/22 have not tried  MET 05/27/22   PLAN: PT FREQUENCY: 1x/week  PT DURATION: 12 weeks  PLANNED INTERVENTIONS: Therapeutic exercises, Therapeutic activity, Neuromuscular re-education, Balance training, Gait training, Patient/Family education, Self Care, Joint  mobilization, Stair training, Dry Needling, Electrical stimulation, Moist heat, Taping, Traction, and Manual therapy.  PLAN FOR NEXT SESSION:D/C  PHYSICAL THERAPY DISCHARGE SUMMARY   Patient agrees to discharge. Patient goals were partially met. Patient is being discharged due to being pleased with the current functional level.   Ethel Rana DPT 05/27/22 11:28 AM   Angie Catheryne Deford PTA 05/27/22 10:59 AM   05/27/22 10:59 AM [facility] [place]. [place], [place], [address] Phone: [phone]   Fax:  [phone]  Patient Details  Name: Phillip [name] MRN: [id number] Date of Birth: August 06, 1950 Referring Provider:  Darreld Mclean, MD  Encounter Date: 05/27/2022

## 2022-06-10 ENCOUNTER — Encounter: Payer: Self-pay | Admitting: Family Medicine

## 2022-06-13 ENCOUNTER — Other Ambulatory Visit: Payer: Self-pay | Admitting: Family Medicine

## 2022-06-13 DIAGNOSIS — G2581 Restless legs syndrome: Secondary | ICD-10-CM

## 2022-06-25 ENCOUNTER — Other Ambulatory Visit: Payer: Self-pay | Admitting: Family Medicine

## 2022-06-25 DIAGNOSIS — E119 Type 2 diabetes mellitus without complications: Secondary | ICD-10-CM

## 2022-07-09 ENCOUNTER — Encounter: Payer: Self-pay | Admitting: Family Medicine

## 2022-07-09 DIAGNOSIS — Z2989 Encounter for other specified prophylactic measures: Secondary | ICD-10-CM

## 2022-07-10 MED ORDER — DOXYCYCLINE HYCLATE 100 MG PO CAPS: 100.0000 mg | ORAL_CAPSULE | Freq: Every day | ORAL | 0 refills | Status: DC

## 2022-07-12 DIAGNOSIS — E119 Type 2 diabetes mellitus without complications: Secondary | ICD-10-CM | POA: Diagnosis not present

## 2022-07-12 LAB — HM DIABETES EYE EXAM

## 2022-07-13 DIAGNOSIS — H5203 Hypermetropia, bilateral: Secondary | ICD-10-CM | POA: Diagnosis not present

## 2022-08-15 ENCOUNTER — Other Ambulatory Visit: Payer: Self-pay | Admitting: Family Medicine

## 2022-09-12 NOTE — Patient Instructions (Incomplete)
Great to see you again today as always, I will be in touch with your labs soon as possible.  Assuming all is well lets check back in 4 to 6 months We will send you a Cologuard kit to do at home - if positive let's of course revisit getting a colonoscopy   Recommend Shingrix if not done already at pharmacy

## 2022-09-12 NOTE — Progress Notes (Unsigned)
[facility] [address] [place], [place] [address] [phone]  Date:  09/14/2022   Name:  Phillip [name]   DOB:  16-Dec-1950   MRN:  [id number]  PCP:  Pearline Cables, MD    Chief Complaint: No chief complaint on file.   History of Present Illness:  Phillip [name] is a 72 y.o. very pleasant male patient who presents with the following:  Patient seen today for follow-up.  I saw him most recently in January  History of diabetes, sleep apnea on CPAP, diabetic neuropathy, CAD, migraine headache, ischemic stroke in 2014 with no residual      Increased Ozempic to 2 mg in January-we hope to see improvement of A1c today He is also tried splitting his Lantus into 2 doses to see if that would help with late in the day hyperglycemia Lab Results  Component Value Date   HGBA1C 8.1 (H) 05/04/2022   Shingrix May need a COVID booster Eye exam Colon cancer screening appears to be due Can update foot exam  Patient Active Problem List   Diagnosis Date Noted   Atrial tachycardia 04/22/2022   Palpitations 02/18/2022   Severe obstructive sleep apnea-hypopnea syndrome 07/01/2020   Dependence on CPAP ventilation 07/01/2020   Type 2 diabetes mellitus with diabetic neuropathy, without long-term current use of insulin ([facility]) 09/02/2019   OSA and COPD overlap syndrome ([facility]) 09/02/2019   Abnormal hemoglobin ([facility]) 09/02/2019   OSA on CPAP 08/12/2015   Ventricular tachycardia ([facility]) 11/10/2014   CAD (coronary artery disease), native coronary artery    Variants of migraine, not elsewhere classified, without mention of intractable migraine without mention of status migrainosus 08/14/2013   History of ischemic stroke without residual deficits 04/17/2013   ED (erectile dysfunction) 02/01/2012   Gout 09/05/2011   Hyperlipidemia 09/05/2011   Hypertriglyceridemia 09/05/2011   PVC (premature ventricular contraction) 09/05/2011    Essential hypertension, benign 06/01/2011    Past Medical History:  Diagnosis Date   Anemia    Arthritis    Diabetes mellitus    Gout    Hyperlipidemia    Hypertension    Localized osteoarthritis of left knee 11/20/2012   Migraine headache with aura    Neuromuscular disorder ([facility])    " mild periferal neauropathy   Osteoarthritis of left hip 11/20/2012   PVC (premature ventricular contraction)    Sleep apnea    cpap    Past Surgical History:  Procedure Laterality Date   CHOLECYSTECTOMY  1997   CVA     CVA, occiptal   CVA     CVA, occipital   knee arthoscopy  2006   Left Knee   LEFT HEART CATH AND CORONARY ANGIOGRAPHY N/A 12/07/2017   Procedure: LEFT HEART CATH AND CORONARY ANGIOGRAPHY;  Surgeon: Lennette Bihari, MD;  Location: [facility];  Service: Cardiovascular;  Laterality: N/A;   left total hip     left unicarpmental knee     LOOP RECORDER IMPLANT N/A 04/19/2013   Procedure: LOOP RECORDER IMPLANT;  Surgeon: Marinus Maw, MD;  Location: [facility] CATH LAB;  Service: Cardiovascular;  Laterality: N/A;   PARTIAL KNEE ARTHROPLASTY Left 11/20/2012   Procedure: UNICOMPARTMENTAL KNEE;  Surgeon: Eulas Post, MD;  Location: [facility] OR;  Service: Orthopedics;  Laterality: Left;   REPLACEMENT UNICONDYLAR JOINT KNEE Left 11/20/2012   Dr Dion Saucier   TEE WITHOUT CARDIOVERSION N/A 04/19/2013   Procedure: TRANSESOPHAGEAL ECHOCARDIOGRAM (TEE);  Surgeon: Laurey Morale, MD;  Location: [facility] ENDOSCOPY;  Service: Cardiovascular;  Laterality: N/A;   TONSILLECTOMY AND ADENOIDECTOMY     Childhood   TOTAL HIP ARTHROPLASTY Left 11/20/2012   Dr Dion Saucier   TOTAL HIP ARTHROPLASTY Left 11/20/2012   Procedure: TOTAL HIP ARTHROPLASTY;  Surgeon: Eulas Post, MD;  Location: [facility];  Service: Orthopedics;  Laterality: Left;    Social History   Tobacco Use   Smoking status: Never   Smokeless tobacco: Never  Substance Use Topics   Alcohol use: No    Alcohol/week: 0.0 standard drinks of alcohol   Drug  use: No    Family History  Problem Relation Age of Onset   Cancer Mother        Breast   Stroke Mother    Stroke Maternal Grandmother    Stroke Maternal Aunt    Stroke Maternal Uncle     Allergies  Allergen Reactions   Sulfa Antibiotics Rash and Other (See Comments)    Reaction not confirmed, but a possibility (from childhood)    Medication list has been reviewed and updated.  Current Outpatient Medications on File Prior to Visit  Medication Sig Dispense Refill   doxycycline (VIBRAMYCIN) 100 MG capsule Take 1 capsule (100 mg total) by mouth daily. Start 2 days prior to exposure and continue for 4 weeks after exposure 60 capsule 0   rOPINIRole (REQUIP) 0.25 MG tablet Take 2 tablets (0.5 mg total) by mouth at bedtime as needed. 180 tablet 0   acetic acid 2 % otic solution Place 4 drops into both ears 4 (four) times daily. 15 mL 1   Alcohol Swabs PADS Use to test up to 4 times daily 100 each 2   ALEVE 220 MG tablet Take 220-440 mg by mouth 2 (two) times daily as needed (for headaches).     allopurinol (ZYLOPRIM) 300 MG tablet TAKE 1 TABLET BY MOUTH DAILY 90 tablet 3   blood glucose meter kit and supplies KIT Pt needs One Touch Ultra glucometer.  Use up to BID to check blood sugars 1 each 0   clopidogrel (PLAVIX) 75 MG tablet Take 1 tablet (75 mg total) by mouth daily. 90 tablet 3   dapagliflozin propanediol (FARXIGA) 10 MG TABS tablet TAKE 1 TABLET(10 MG) BY MOUTH DAILY BEFORE BREAKFAST 90 tablet 1   diclofenac sodium (VOLTAREN) 1 % GEL Apply 2-4 g topically 2 (two) times daily as needed (for pain).     glucose blood (ONETOUCH ULTRA) test strip USE TO TEST UP TO FOUR TIMES DAILY 200 strip 12   hydrochlorothiazide (MICROZIDE) 12.5 MG capsule Take 1 capsule (12.5 mg total) by mouth daily. 90 capsule 3   insulin glargine (LANTUS SOLOSTAR) 100 UNIT/ML Solostar Pen ADMINISTER 30 UNITS UNDER THE SKIN DAILY (Patient taking differently: 25 Units.) 15 mL 5   Insulin Pen Needle (PEN NEEDLES)  32G X 5 MM MISC 1 each by Does not apply route daily. Use for basal insulin injection 100 each 6   losartan (COZAAR) 100 MG tablet TAKE 1 TABLET(100 MG) BY MOUTH DAILY 90 tablet 3   metFORMIN (GLUCOPHAGE-XR) 500 MG 24 hr tablet TAKE 2 TABLETS(1000 MG) BY MOUTH TWICE DAILY 360 tablet 3   nebivolol (BYSTOLIC) 10 MG tablet TAKE 1 TABLET BY MOUTH EVERY EVENING. 90 tablet 3   rosuvastatin (CRESTOR) 5 MG tablet TAKE 1 TABLET BY MOUTH EVERY MONDAY, WEDNESDAY, AND FRIDAY 39 tablet 2   Semaglutide, 2 MG/DOSE, 8 MG/3ML SOPN Inject 2 mg as  directed once a week. 9 mL 3   silodosin (RAPAFLO) 8 MG CAPS capsule Take 8 mg by mouth daily.     SM LANCETS 33G MISC USE TO TEST BLOOD SUGAR TWICE DAILY 400 each PRN   tadalafil (CIALIS) 5 MG tablet Take 0.5 tablets (2.5 mg total) by mouth daily as needed for erectile dysfunction (for urinary dx). 45 tablet 3   zolpidem (AMBIEN) 5 MG tablet Take 1 tablet (5 mg total) by mouth at bedtime as needed for sleep. 30 tablet 1   No current facility-administered medications on file prior to visit.    Review of Systems:  As per HPI- otherwise negative.   Physical Examination: There were no vitals filed for this visit. There were no vitals filed for this visit. There is no height or weight on file to calculate BMI. Ideal Body Weight:    GEN: no acute distress. HEENT: Atraumatic, Normocephalic.  Ears and Nose: No external deformity. CV: RRR, No M/G/R. No JVD. No thrill. No extra heart sounds. PULM: CTA B, no wheezes, crackles, rhonchi. No retractions. No resp. distress. No accessory muscle use. ABD: S, NT, ND, +BS. No rebound. No HSM. EXTR: No c/c/e PSYCH: Normally interactive. Conversant.    Assessment and Plan: ***  Signed Abbe Amsterdam, MD

## 2022-09-14 ENCOUNTER — Other Ambulatory Visit: Payer: Self-pay | Admitting: Family Medicine

## 2022-09-14 ENCOUNTER — Ambulatory Visit (INDEPENDENT_AMBULATORY_CARE_PROVIDER_SITE_OTHER): Payer: Medicare Other | Admitting: Family Medicine

## 2022-09-14 VITALS — BP 138/80 | HR 75 | Temp 97.8°F | Resp 18 | Ht 72.0 in | Wt 223.4 lb

## 2022-09-14 DIAGNOSIS — Z7985 Long-term (current) use of injectable non-insulin antidiabetic drugs: Secondary | ICD-10-CM | POA: Diagnosis not present

## 2022-09-14 DIAGNOSIS — E1140 Type 2 diabetes mellitus with diabetic neuropathy, unspecified: Secondary | ICD-10-CM

## 2022-09-14 DIAGNOSIS — E782 Mixed hyperlipidemia: Secondary | ICD-10-CM

## 2022-09-14 DIAGNOSIS — M1000 Idiopathic gout, unspecified site: Secondary | ICD-10-CM | POA: Diagnosis not present

## 2022-09-14 DIAGNOSIS — I10 Essential (primary) hypertension: Secondary | ICD-10-CM

## 2022-09-14 DIAGNOSIS — Z1211 Encounter for screening for malignant neoplasm of colon: Secondary | ICD-10-CM

## 2022-09-14 DIAGNOSIS — G2581 Restless legs syndrome: Secondary | ICD-10-CM

## 2022-09-14 DIAGNOSIS — Z8673 Personal history of transient ischemic attack (TIA), and cerebral infarction without residual deficits: Secondary | ICD-10-CM

## 2022-09-14 MED ORDER — CLOPIDOGREL BISULFATE 75 MG PO TABS: 75.0000 mg | ORAL_TABLET | Freq: Every day | ORAL | 3 refills | Status: DC

## 2022-09-14 MED ORDER — ROPINIROLE HCL 0.25 MG PO TABS: 0.5000 mg | ORAL_TABLET | Freq: Every evening | ORAL | 0 refills | Status: DC | PRN

## 2022-09-14 MED ORDER — ALLOPURINOL 300 MG PO TABS: 150.0000 mg | ORAL_TABLET | Freq: Every day | ORAL | 3 refills | Status: DC

## 2022-09-15 ENCOUNTER — Encounter: Payer: Self-pay | Admitting: Family Medicine

## 2022-09-15 LAB — BASIC METABOLIC PANEL
BUN: 25 mg/dL — ABNORMAL HIGH (ref 6–23)
CO2: 24 mEq/L (ref 19–32)
Calcium: 9.6 mg/dL (ref 8.4–10.5)
Chloride: 103 mEq/L (ref 96–112)
Creatinine, Ser: 0.95 mg/dL (ref 0.40–1.50)
GFR: 79.32 mL/min (ref >60.00)
Glucose, Bld: 139 mg/dL — ABNORMAL HIGH (ref 70–99)
Potassium: 3.8 mEq/L (ref 3.5–5.1)
Sodium: 137 mEq/L (ref 135–145)

## 2022-09-15 LAB — HEMOGLOBIN A1C: Hgb A1c MFr Bld: 7.8 % — ABNORMAL HIGH (ref 4.6–6.5)

## 2022-09-23 DIAGNOSIS — Z1211 Encounter for screening for malignant neoplasm of colon: Secondary | ICD-10-CM | POA: Diagnosis not present

## 2022-09-30 LAB — COLOGUARD: COLOGUARD: NEGATIVE

## 2022-10-03 DIAGNOSIS — R972 Elevated prostate specific antigen [PSA]: Secondary | ICD-10-CM | POA: Diagnosis not present

## 2022-10-03 DIAGNOSIS — N401 Enlarged prostate with lower urinary tract symptoms: Secondary | ICD-10-CM | POA: Diagnosis not present

## 2022-10-03 DIAGNOSIS — R3914 Feeling of incomplete bladder emptying: Secondary | ICD-10-CM | POA: Diagnosis not present

## 2022-10-03 DIAGNOSIS — R3912 Poor urinary stream: Secondary | ICD-10-CM | POA: Diagnosis not present

## 2022-10-05 ENCOUNTER — Encounter: Payer: Self-pay | Admitting: Family Medicine

## 2022-10-05 MED ORDER — HYDROCORTISONE-ACETIC ACID 1-2 % OT SOLN: 3.0000 [drp] | Freq: Two times a day (BID) | OTIC | 3 refills | Status: AC

## 2022-10-06 ENCOUNTER — Telehealth: Payer: Self-pay | Admitting: Family Medicine

## 2022-10-06 NOTE — Telephone Encounter (Signed)
Copied from CRM [id number]. Topic: Medicare AWV >> Oct 06, 2022 10:41 AM Payton Doughty wrote: Reason for CRM: .LM 10/06/2022 to schedule AWV   Verlee Rossetti; Care Guide Ambulatory Clinical Support [place] [facility] Direct Dial: [phone]

## 2022-10-24 DIAGNOSIS — S7011XA Contusion of right thigh, initial encounter: Secondary | ICD-10-CM | POA: Diagnosis not present

## 2022-10-24 DIAGNOSIS — T8484XA Pain due to internal orthopedic prosthetic devices, implants and grafts, initial encounter: Secondary | ICD-10-CM | POA: Diagnosis not present

## 2022-10-27 ENCOUNTER — Telehealth: Payer: Self-pay | Admitting: Family Medicine

## 2022-10-27 ENCOUNTER — Encounter: Payer: Medicare Other | Admitting: Sports Medicine

## 2022-10-27 NOTE — Telephone Encounter (Signed)
Copied from CRM [id number]. Topic: Medicare AWV >> Oct 27, 2022  1:46 PM Payton Doughty wrote: Reason for CRM: LM 10/27/2022 to schedule AWV   Verlee Rossetti; Care Guide Ambulatory Clinical Support [place] [facility] Direct Dial: [phone]

## 2022-10-31 ENCOUNTER — Ambulatory Visit (INDEPENDENT_AMBULATORY_CARE_PROVIDER_SITE_OTHER): Payer: Medicare Other | Admitting: Family Medicine

## 2022-10-31 ENCOUNTER — Encounter: Payer: Self-pay | Admitting: Family Medicine

## 2022-10-31 ENCOUNTER — Other Ambulatory Visit: Payer: Self-pay | Admitting: Family Medicine

## 2022-10-31 ENCOUNTER — Ambulatory Visit (HOSPITAL_BASED_OUTPATIENT_CLINIC_OR_DEPARTMENT_OTHER)
Admission: RE | Admit: 2022-10-31 | Discharge: 2022-10-31 | Disposition: A | Discharge status: Home / Self Care | Payer: Medicare Other | Source: Ambulatory Visit | Attending: Family Medicine | Admitting: Family Medicine

## 2022-10-31 VITALS — BP 140/82 | HR 71 | Temp 98.2°F | Resp 18 | Ht 72.0 in | Wt 221.0 lb

## 2022-10-31 DIAGNOSIS — T148XXA Other injury of unspecified body region, initial encounter: Secondary | ICD-10-CM | POA: Insufficient documentation

## 2022-10-31 DIAGNOSIS — S8011XA Contusion of right lower leg, initial encounter: Secondary | ICD-10-CM

## 2022-10-31 DIAGNOSIS — R600 Localized edema: Secondary | ICD-10-CM | POA: Diagnosis not present

## 2022-10-31 NOTE — Patient Instructions (Signed)
It was good to see you again today!  I am sorry you got hurt  I will be in touch with your ultrasound report

## 2022-10-31 NOTE — Progress Notes (Signed)
[facility] [address], [address] [place], [place] [address] [phone]  Date:  10/31/2022   Name:  Phillip [name]   DOB:  [birth year]   MRN:  [id number]  PCP:  Phillip Cables, MD    Chief Complaint: hematoma (D/t fall in [place]: right posterior theigh just below the hip, still present from shin to foot. Swelling has jump weight 6+ pounds. DVT study scheduled. )   History of Present Illness:  Phillip [name] is a 72 y.o. very pleasant male patient who presents with the following:  Patient seen today with concern about recent fall and associated hematoma- rt leg I saw him about 6 weeks ago for routine follow-up History of diabetes, sleep apnea on CPAP, diabetic neuropathy, CAD, migraine headache, ischemic stroke in 2014 with no residual  Recent A1c showed some improvement, 7.8% down from 8.1%  He sent me a MyChart message this morning as follows: Over 2 weeks ago when in [place] I had a fall, landed on a rock and sustained a massive hematoma the size of a small cantelope of my right posterior theigh just below the hip.  I had to miserably sit on it 2 days later on the long flight home.  As it dissipated the bruising went from hip to toes, and is very slowly resolving.  The swelling jumped my weight 6+ pounds, but most of the edema is now gone, still present from mid shin to foot. The whole leg had become quite painful, is improving but hurts for a few minutes when I stand or if touched or bumped.  Phillip [name] xrayed it last Monday and all okay, thinks it is from the hematoma (I also spoke to Phillip [name] on the phone who agrees).  However, I was wanting to be seen or have a doppler ordered to be sure there is no clot. I am improved and think it is pain from the swelling.  You are full, but wondered if I could be squeezed in sometime for you or an associate, or if I could have a venous U/S ordered.    Phillip [name] notes he lost his balance and  fell backwards while visiting a historical site, hit the back of his right thigh on a rock This occurred about 17 days ago He got a hematoma on the back of his leg, it was quite large at Phillip.  The hematoma has now dissipated, but the blood collection and bruising has spread into his lower leg and foot.  He became concerned because his calf is now swollen and tender X-ray per orthopedics was negative for fracture  He is on plavix-no other blood thinner at this time Using tylenol and some aleve for pain control No history of DVT or PE No CP no shortness of breath  Wt Readings from Last 3 Encounters:  10/31/22 221 lb (100.2 kg)  09/14/22 223 lb 6.4 oz (101.3 kg)  05/04/22 223 lb (101.2 kg)   He got down to 219 recently, his goal is to get to about 210 pounds.  We do think he is retaining some fluid right now in his leg  Patient Active Problem List   Diagnosis Date Noted   Atrial tachycardia 04/22/2022   Palpitations 02/18/2022   Severe obstructive sleep apnea-hypopnea syndrome 07/01/2020   Dependence on CPAP ventilation 07/01/2020   Type 2 diabetes mellitus with diabetic neuropathy, without long-term current use of insulin (HCC) 09/02/2019   OSA  and COPD overlap syndrome (HCC) 09/02/2019   Abnormal hemoglobin (HCC) 09/02/2019   OSA on CPAP 08/12/2015   Ventricular tachycardia (HCC) 11/10/2014   CAD (coronary artery disease), native coronary artery    Variants of migraine, not elsewhere classified, without mention of intractable migraine without mention of status migrainosus 08/14/2013   History of ischemic stroke without residual deficits 04/17/2013   ED (erectile dysfunction) 02/01/2012   Gout 09/05/2011   Hyperlipidemia 09/05/2011   Hypertriglyceridemia 09/05/2011   PVC (premature ventricular contraction) 09/05/2011   Essential hypertension, benign 06/01/2011    Past Medical History:  Diagnosis Date   Anemia    Arthritis    Diabetes mellitus    Gout    Hyperlipidemia     Hypertension    Localized osteoarthritis of left knee 11/20/2012   Migraine headache with aura    Neuromuscular disorder (HCC)    " mild periferal neauropathy   Osteoarthritis of left hip 11/20/2012   PVC (premature ventricular contraction)    Sleep apnea    cpap    Past Surgical History:  Procedure Laterality Date   CHOLECYSTECTOMY  1997   CVA     CVA, occiptal   CVA     CVA, occipital   knee arthoscopy  2006   Left Knee   LEFT HEART CATH AND CORONARY ANGIOGRAPHY N/A 12/07/2017   Procedure: LEFT HEART CATH AND CORONARY ANGIOGRAPHY;  Surgeon: Phillip Bihari, MD;  [facility]: [facility] INVASIVE CV LAB;  Service: Cardiovascular;  Laterality: N/A;   left total hip     left unicarpmental knee     LOOP RECORDER IMPLANT N/A 04/19/2013   Procedure: LOOP RECORDER IMPLANT;  Surgeon: Phillip Maw, MD;  Location: [facility] CATH LAB;  Service: Cardiovascular;  Laterality: N/A;   PARTIAL KNEE ARTHROPLASTY Left 11/20/2012   Procedure: UNICOMPARTMENTAL KNEE;  Surgeon: Phillip Post, MD;  Location: [facility] OR;  Service: Orthopedics;  Laterality: Left;   REPLACEMENT UNICONDYLAR JOINT KNEE Left 11/20/2012   Dr Phillip [name]   TEE WITHOUT CARDIOVERSION N/A 04/19/2013   Procedure: TRANSESOPHAGEAL ECHOCARDIOGRAM (TEE);  Surgeon: Phillip Morale, MD;  Location: [facility] ENDOSCOPY;  Service: Cardiovascular;  Laterality: N/A;   TONSILLECTOMY AND ADENOIDECTOMY     Childhood   TOTAL HIP ARTHROPLASTY Left 11/20/2012   Dr Phillip [name]   TOTAL HIP ARTHROPLASTY Left 11/20/2012   Procedure: TOTAL HIP ARTHROPLASTY;  Surgeon: Phillip Post, MD;  Location: [facility] OR;  Service: Orthopedics;  Laterality: Left;    Social History   Tobacco Use   Smoking status: Never   Smokeless tobacco: Never  Substance Use Topics   Alcohol use: No    Alcohol/week: 0.0 standard drinks of alcohol   Drug use: No    Family History  Problem Relation Age of Onset   Cancer Mother        Breast   Stroke Mother    Stroke Maternal Grandmother    Stroke Maternal  Aunt    Stroke Maternal Uncle     Allergies  Allergen Reactions   Sulfa Antibiotics Rash and Other (See Comments)    Reaction not confirmed, but a possibility (from childhood)    Medication list has been reviewed and updated.  Current Outpatient Medications on File Prior to Visit  Medication Sig Dispense Refill   acetic acid 2 % otic solution Place 4 drops into both ears 4 (four) times daily. 15 mL 1   acetic acid-hydrocortisone (VOSOL-HC) OTIC solution Place 3 drops into both ears 2 (two) times daily. Use  as needed for ear itching 10 mL 3   Alcohol Swabs PADS Use to test up to 4 times daily 100 each 2   ALEVE 220 MG tablet Take 220-440 mg by mouth 2 (two) times daily as needed (for headaches).     allopurinol (ZYLOPRIM) 300 MG tablet Take 0.5 tablets (150 mg total) by mouth daily. 45 tablet 3   blood glucose meter kit and supplies KIT Pt needs One Touch Ultra glucometer.  Use up to BID to check blood sugars 1 each 0   clopidogrel (PLAVIX) 75 MG tablet Take 1 tablet (75 mg total) by mouth daily. 90 tablet 3   dapagliflozin propanediol (FARXIGA) 10 MG TABS tablet TAKE 1 TABLET(10 MG) BY MOUTH DAILY BEFORE BREAKFAST 90 tablet 1   diclofenac sodium (VOLTAREN) 1 % GEL Apply 2-4 g topically 2 (two) times daily as needed (for pain).     glucose blood (ONETOUCH ULTRA) test strip USE TO TEST UP TO FOUR TIMES DAILY 200 strip 12   hydrochlorothiazide (MICROZIDE) 12.5 MG capsule Take 1 capsule (12.5 mg total) by mouth daily. 90 capsule 3   insulin glargine (LANTUS SOLOSTAR) 100 UNIT/ML Solostar Pen ADMINISTER 30 UNITS UNDER THE SKIN DAILY (Patient taking differently: 25 Units.) 15 mL 5   Insulin Pen Needle (PEN NEEDLES) 32G X 5 MM MISC 1 each by Does not apply route daily. Use for basal insulin injection 100 each 6   losartan (COZAAR) 100 MG tablet TAKE 1 TABLET(100 MG) BY MOUTH DAILY 90 tablet 3   metFORMIN (GLUCOPHAGE-XR) 500 MG 24 hr tablet TAKE 2 TABLETS(1000 MG) BY MOUTH TWICE DAILY 360 tablet  3   nebivolol (BYSTOLIC) 10 MG tablet TAKE 1 TABLET BY MOUTH EVERY EVENING 90 tablet 3   rOPINIRole (REQUIP) 0.25 MG tablet Take 2 tablets (0.5 mg total) by mouth at bedtime as needed. 180 tablet 0   rosuvastatin (CRESTOR) 5 MG tablet TAKE 1 TABLET BY MOUTH EVERY MONDAY, WEDNESDAY, AND FRIDAY 39 tablet 2   Semaglutide, 2 MG/DOSE, 8 MG/3ML SOPN Inject 2 mg as directed once a week. 9 mL 3   SM LANCETS 33G MISC USE TO TEST BLOOD SUGAR TWICE DAILY 400 each PRN   tadalafil (CIALIS) 5 MG tablet Take 0.5 tablets (2.5 mg total) by mouth daily as needed for erectile dysfunction (for urinary dx). 45 tablet 3   zolpidem (AMBIEN) 5 MG tablet Take 1 tablet (5 mg total) by mouth at bedtime as needed for sleep. 30 tablet 1   No current facility-administered medications on file prior to visit.    Review of Systems:  .As per HPI- otherwise negative.   Physical Examination: Vitals:   10/31/22 1528 10/31/22 1549  BP: (!) 150/82 (!) 140/82  Pulse: 71   Resp: 18   Temp: 98.2 F (36.8 C)   SpO2: 97%    Vitals:   10/31/22 1528  Weight: 221 lb (100.2 kg)  Height: 6' (1.829 m)   Body mass index is 29.97 kg/m. Ideal Body Weight: Weight in (lb) to have BMI = 25: 183.9  GEN: no acute distress.  Overweight, looks well and his normal self HEENT: Atraumatic, Normocephalic.  Ears and Nose: No external deformity. CV: RRR, No M/G/R. No JVD. No thrill. No extra heart sounds. PULM: CTA B, no wheezes, crackles, rhonchi. No retractions. No resp. distress. No accessory muscle use. EXTR: No c/c/e PSYCH: Normally interactive. Conversant.  Hematoma is no longer visible on the back of the right thigh.  However, there is diffuse  1+ pitting edema in the right calf and some bruising visible in the ankle, foot and toes.  Most likely swelling is caused by blood from hematoma draining down into the lower leg secondary to gravity No skin breakdown of the leg Strong pulse in the right foot Swollen area of the calf is  tender BP Readings from Last 3 Encounters:  10/31/22 (!) 140/82  09/14/22 138/80  05/04/22 124/72    Assessment and Plan: Hematoma of right lower extremity, initial encounter  Patient seen today with concern of hematoma to the right lower extremity, he also has swelling and tenderness of the right lower leg.  This is likely simply due to blood from the hematoma draining down into the lower leg, but would like to rule out a DVT.  We will obtain an ultrasound later this evening  Signed Abbe Amsterdam, MD  Received ultrasound report, message to patient  US Venous Img Lower Unilateral Right  Result Date: 10/31/2022 CLINICAL DATA:  Right lower extremity edema EXAM: RIGHT LOWER EXTREMITY VENOUS DOPPLER ULTRASOUND TECHNIQUE: Gray-scale sonography with compression, as well as color and duplex ultrasound, were performed to evaluate the deep venous system(s) from the level of the common femoral vein through the popliteal and proximal calf veins. COMPARISON:  None Available. FINDINGS: VENOUS Normal compressibility of the common femoral, superficial femoral, and popliteal veins, as well as the visualized calf veins. Visualized portions of profunda femoral vein and great saphenous vein unremarkable. No filling defects to suggest DVT on grayscale or color Doppler imaging. Doppler waveforms show normal direction of venous flow, normal respiratory plasticity and response to augmentation. Limited views of the contralateral common femoral vein are unremarkable. OTHER None. Limitations: none IMPRESSION: Negative. Electronically Signed   By: Malachy Moan M.D.   On: 10/31/2022 17:10

## 2022-11-08 DIAGNOSIS — G4733 Obstructive sleep apnea (adult) (pediatric): Secondary | ICD-10-CM | POA: Diagnosis not present

## 2022-11-11 ENCOUNTER — Encounter: Payer: Self-pay | Admitting: Neurology

## 2022-11-16 ENCOUNTER — Encounter: Payer: Self-pay | Admitting: Family Medicine

## 2022-11-28 IMAGING — CT CT CARDIAC CORONARY ARTERY CALCIUM SCORE
2 series · 15 of 20 positions shown, 17 images · non-contrast
Comparison: Prior coronary CTA on 11/24/2017
COMPARISON: Prior coronary CTA on 11/24/2017

Addendum:
EXAM:
OVER-READ INTERPRETATION  CT CHEST

The following report is an over-read performed by radiologist Dr.
over-read does not include interpretation of cardiac or coronary
anatomy or pathology. The coronary calcium score interpretation by
the cardiologist is attached.
CLINICAL DATA: Cardiovascular Disease Risk stratification
Coronary Calcium Score
TECHNIQUE: A gated, non-contrast computed tomography scan of the heart was
performed using 3mm slice thickness. Axial images were analyzed on a
dedicated workstation. Calcium scoring of the coronary arteries was
performed using the Agatston method.

[Series 3: casc 3.0 i36f 2 71% · axial · 0.39mm/px · z∈[-253,-145]mm · 8 of 48 slices shown, 10 images]
[im 6/48  vessel]
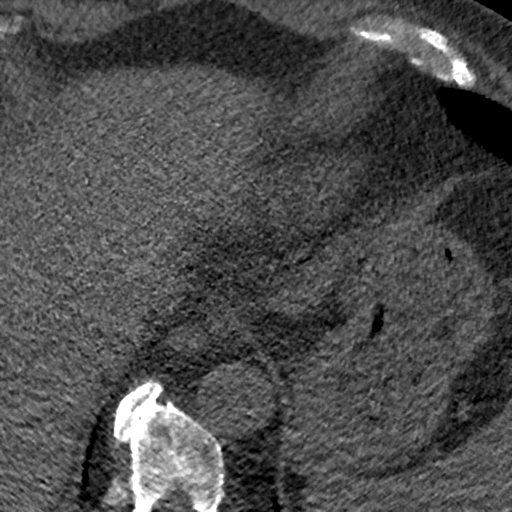
[im 6/48  lung]
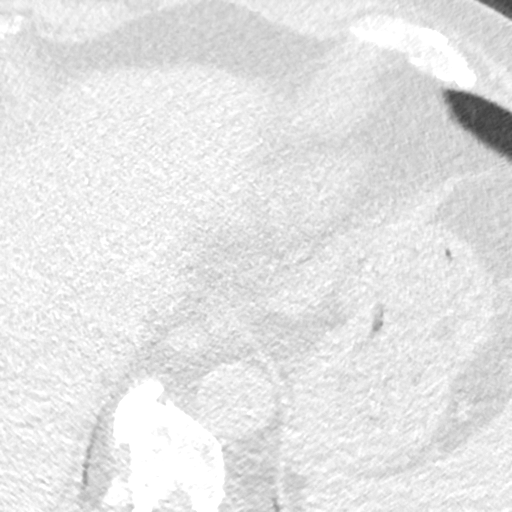
[im 11/48  vessel]
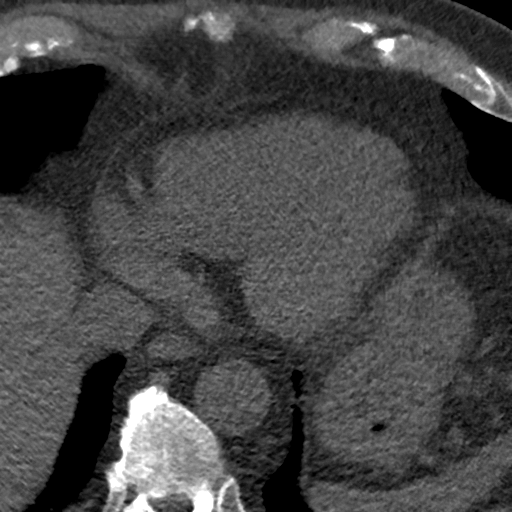
[im 16/48  vessel]
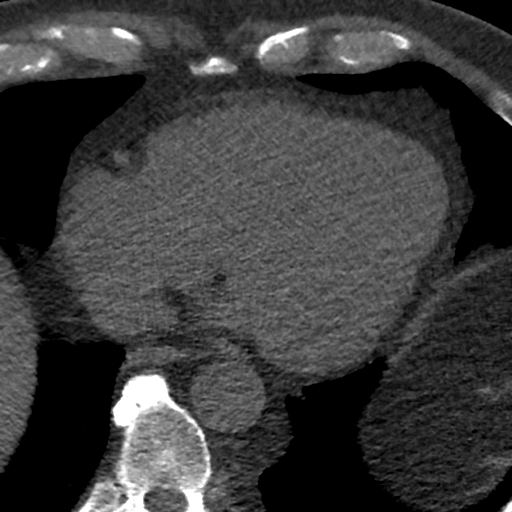
[im 21/48  vessel]
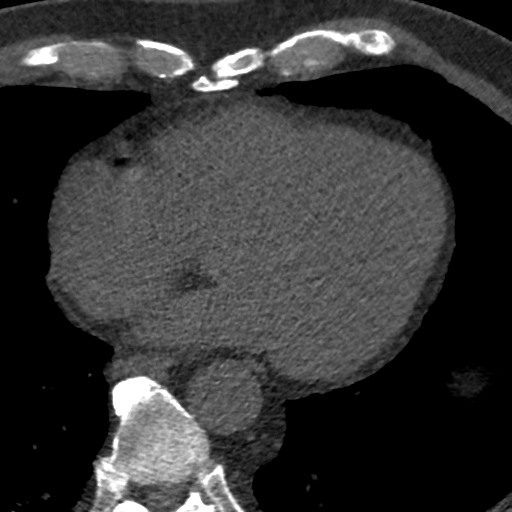
[im 27/48  vessel]
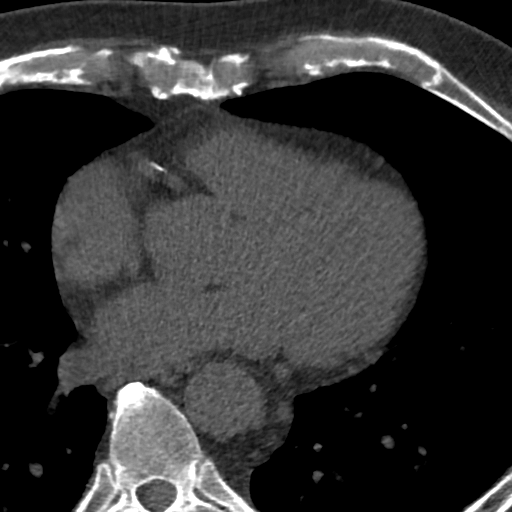
[im 27/48  lung]
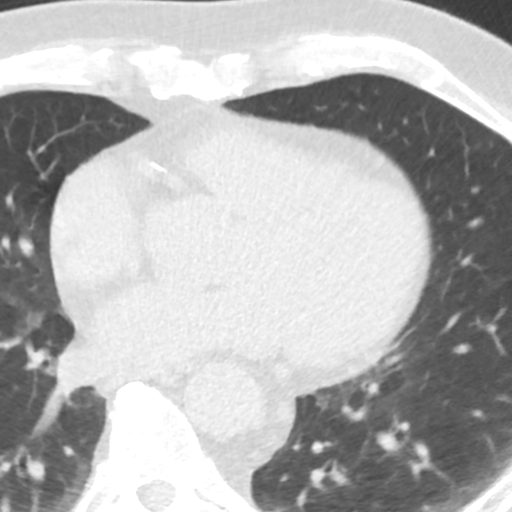
[im 32/48  vessel]
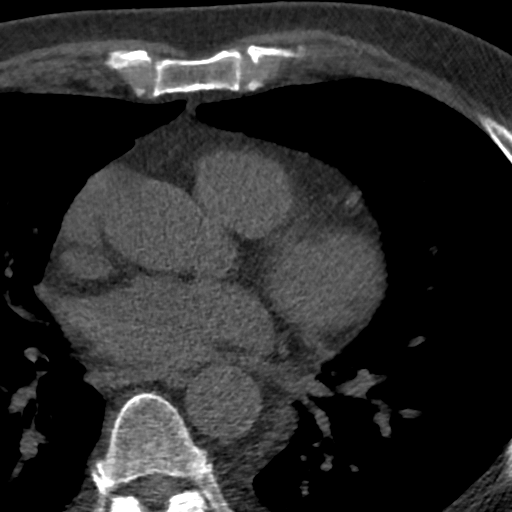
[im 37/48  vessel]
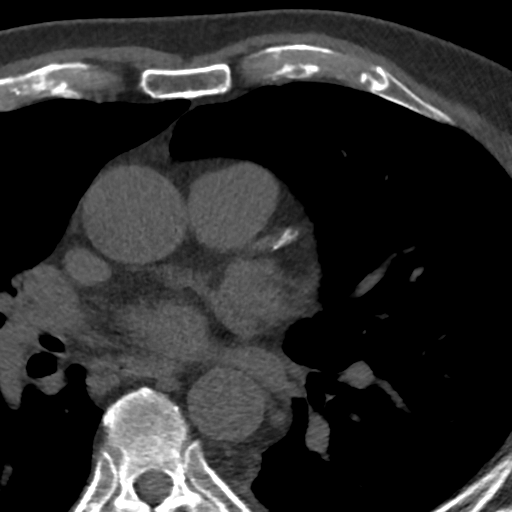
[im 42/48  vessel]
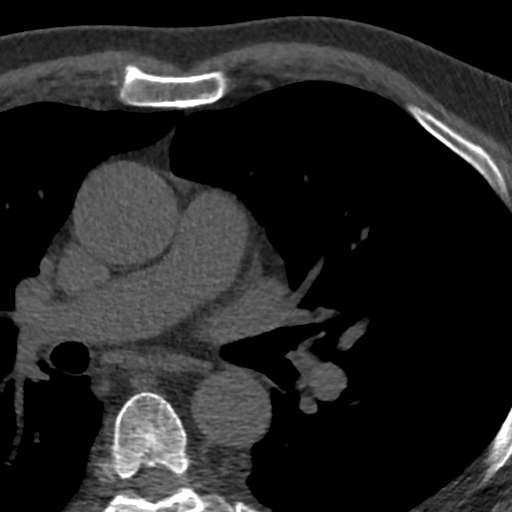

[Series 5: lung st 69 % · axial · 0.79mm/px · z∈[-253,-160]mm · 7 of 48 slices shown]
[im 6/48  lung]
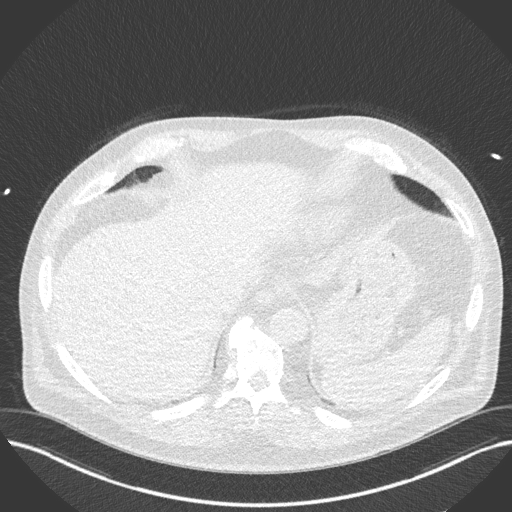
[im 11/48  lung]
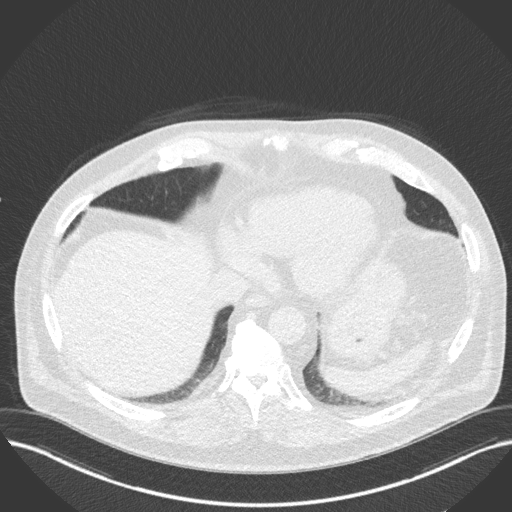
[im 16/48  lung]
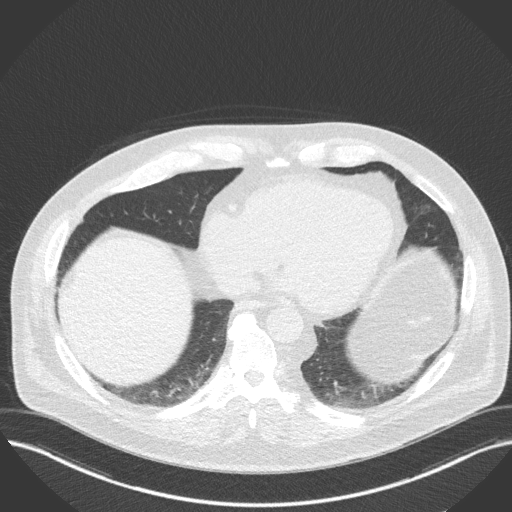
[im 21/48  lung]
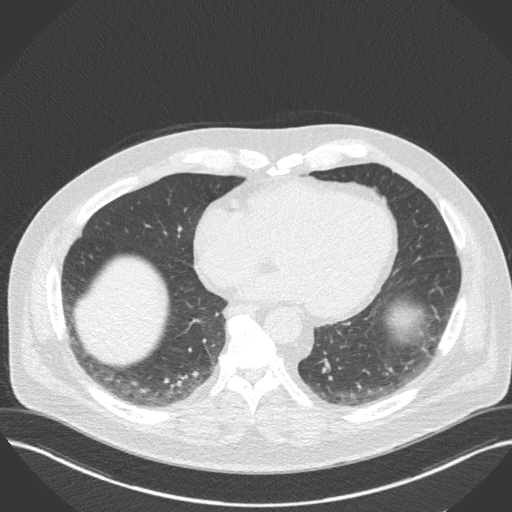
[im 27/48  lung]
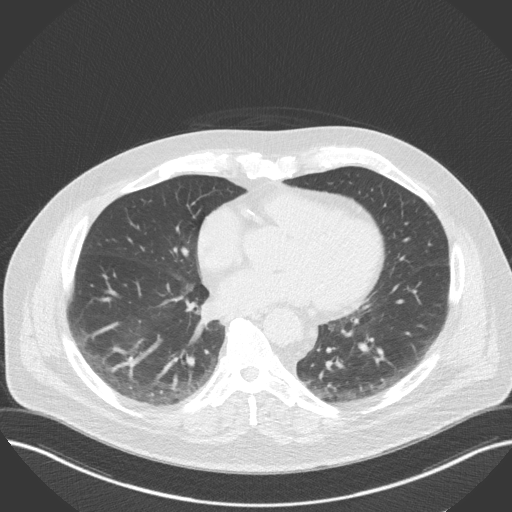
[im 32/48  lung]
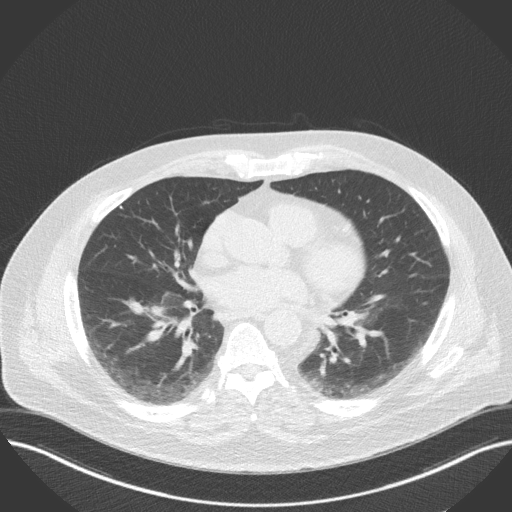
[im 37/48  lung]
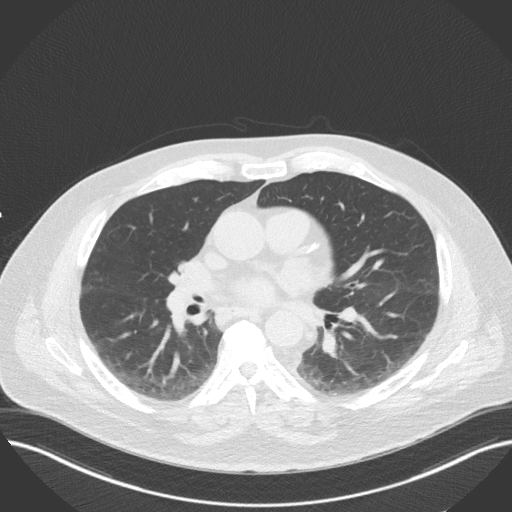

[15 of 20 positions shown; findings below may reference images not displayed]

FINDINGS: Vascular: Top-normal to mildly dilated ascending thoracic aorta
measuring approximately 3.9-4.0 cm by unenhanced CT.

Mediastinum/Nodes: Visualized mediastinum and hilar regions
demonstrate no lymphadenopathy or masses.

Lungs/Pleura: Stable tiny calcified granuloma of the right middle
lobe. Visualized lungs show no evidence of pulmonary edema,
consolidation, pneumothorax or pleural fluid.

Upper Abdomen: No acute abnormality.

Musculoskeletal: No chest wall mass or suspicious bone lesions
identified.
IMPRESSION: Top-normal to mildly dilated ascending thoracic aorta measuring
approximately 3.9-4.0 cm by unenhanced CT. Consider eventual
follow-up with contrast enhanced CTA.
FINDINGS: Coronary arteries: Normal origins.

Coronary Calcium Score:

Left main: 0

Left anterior descending artery:

Left circumflex artery: 0

Right coronary artery:

Total:

Percentile: 37 th

Pericardium: Normal.

Ascending Aorta: Dilated 4.1 cm

Non-cardiac: See separate report from [REDACTED].
IMPRESSION: Coronary calcium score of 75.6. This was 37 th percentile for age-,
race-, and sex-matched controls.



If CAC=0, it is reasonable to withhold statin therapy and reassess
in 5 to 10 years, as long as higher risk conditions are absent
(diabetes mellitus, family history of premature CHD in first degree
relatives (males <55 years; females <65 years), cigarette smoking,
or LDL >=190 mg/dL).

If CAC is 1 to 99, it is reasonable to initiate statin therapy for
patients >=55 years of age.

If CAC is >=100 or >=75th percentile, it is reasonable to initiate
statin therapy at any age.

Cardiology referral should be considered for patients with CAC
scores >=400 or >=75th percentile.

*2938 AHA/ACC/AACVPR/AAPA/ABC/WENCES/ZERARA/LORRAINE/Foust/WADE/SZANDIKA/HOHNER
Guideline on the Management of Blood Cholesterol: A Report of the
American College of Cardiology/American Heart Association Task Force
on Clinical Practice Guidelines. J Am Coll Cardiol.
1239;73(24):2409-2533.

*** End of Addendum ***
EXAM:
OVER-READ INTERPRETATION  CT CHEST

The following report is an over-read performed by radiologist Dr.
over-read does not include interpretation of cardiac or coronary
anatomy or pathology. The coronary calcium score interpretation by
the cardiologist is attached.
FINDINGS: Vascular: Top-normal to mildly dilated ascending thoracic aorta
measuring approximately 3.9-4.0 cm by unenhanced CT.

Mediastinum/Nodes: Visualized mediastinum and hilar regions
demonstrate no lymphadenopathy or masses.

Lungs/Pleura: Stable tiny calcified granuloma of the right middle
lobe. Visualized lungs show no evidence of pulmonary edema,
consolidation, pneumothorax or pleural fluid.

Upper Abdomen: No acute abnormality.

Musculoskeletal: No chest wall mass or suspicious bone lesions
identified.
IMPRESSION: Top-normal to mildly dilated ascending thoracic aorta measuring
approximately 3.9-4.0 cm by unenhanced CT. Consider eventual
follow-up with contrast enhanced CTA.

## 2022-12-09 DIAGNOSIS — G4733 Obstructive sleep apnea (adult) (pediatric): Secondary | ICD-10-CM | POA: Diagnosis not present

## 2022-12-10 ENCOUNTER — Other Ambulatory Visit: Payer: Self-pay | Admitting: Family Medicine

## 2022-12-10 DIAGNOSIS — E119 Type 2 diabetes mellitus without complications: Secondary | ICD-10-CM

## 2022-12-15 ENCOUNTER — Other Ambulatory Visit: Payer: Self-pay | Admitting: Family Medicine

## 2022-12-15 ENCOUNTER — Encounter (INDEPENDENT_AMBULATORY_CARE_PROVIDER_SITE_OTHER): Payer: Self-pay

## 2022-12-15 DIAGNOSIS — M1000 Idiopathic gout, unspecified site: Secondary | ICD-10-CM

## 2022-12-15 DIAGNOSIS — G2581 Restless legs syndrome: Secondary | ICD-10-CM

## 2022-12-15 DIAGNOSIS — E119 Type 2 diabetes mellitus without complications: Secondary | ICD-10-CM

## 2022-12-16 ENCOUNTER — Other Ambulatory Visit: Payer: Self-pay | Admitting: Oncology

## 2022-12-16 DIAGNOSIS — Z006 Encounter for examination for normal comparison and control in clinical research program: Secondary | ICD-10-CM

## 2022-12-28 DIAGNOSIS — D225 Melanocytic nevi of trunk: Secondary | ICD-10-CM | POA: Diagnosis not present

## 2022-12-28 DIAGNOSIS — D2261 Melanocytic nevi of right upper limb, including shoulder: Secondary | ICD-10-CM | POA: Diagnosis not present

## 2022-12-28 DIAGNOSIS — L814 Other melanin hyperpigmentation: Secondary | ICD-10-CM | POA: Diagnosis not present

## 2022-12-28 DIAGNOSIS — L570 Actinic keratosis: Secondary | ICD-10-CM | POA: Diagnosis not present

## 2022-12-28 DIAGNOSIS — D2272 Melanocytic nevi of left lower limb, including hip: Secondary | ICD-10-CM | POA: Diagnosis not present

## 2023-01-09 DIAGNOSIS — G4733 Obstructive sleep apnea (adult) (pediatric): Secondary | ICD-10-CM | POA: Diagnosis not present

## 2023-01-13 NOTE — Progress Notes (Unsigned)
[facility] at [facility] [address], [address] [place], [place] [address] [phone]  Date:  01/16/2023   Name:  Phillip [name]   DOB:  1951-02-23   MRN:  [id number]  PCP:  Pearline Cables, MD    Chief Complaint: No chief complaint on file.   History of Present Illness:  Phillip [name] is a 72 y.o. very pleasant male patient who presents with the following:  Patient seen today for periodic recheck Most recent visit with myself was in July-at that time he had recently fallen and landed on a rock, sustained a big hematoma on his left leg.  Ultrasound was negative for DVT History of diabetes, sleep apnea on CPAP, diabetic neuropathy, CAD, migraine headache, ischemic stroke in 2014 with no residual   Shingrix Foot exam can be updated Flu shot COVID booster Lab Results  Component Value Date   HGBA1C 7.8 (H) 09/14/2022   Can update A1c today if he would like-some labs done in May, BMP, A1c  Patient Active Problem List   Diagnosis Date Noted   Atrial tachycardia 04/22/2022   Palpitations 02/18/2022   Severe obstructive sleep apnea-hypopnea syndrome 07/01/2020   Dependence on CPAP ventilation 07/01/2020   Type 2 diabetes mellitus with diabetic neuropathy, without long-term current use of insulin (HCC) 09/02/2019   OSA and COPD overlap syndrome (HCC) 09/02/2019   Abnormal hemoglobin (HCC) 09/02/2019   OSA on CPAP 08/12/2015   Ventricular tachycardia (HCC) 11/10/2014   CAD (coronary artery disease), native coronary artery    Variants of migraine, not elsewhere classified, without mention of intractable migraine without mention of status migrainosus 08/14/2013   History of ischemic stroke without residual deficits 04/17/2013   ED (erectile dysfunction) 02/01/2012   Gout 09/05/2011   Hyperlipidemia 09/05/2011   Hypertriglyceridemia 09/05/2011   PVC (premature ventricular contraction) 09/05/2011   Essential hypertension, benign  06/01/2011    Past Medical History:  Diagnosis Date   Anemia    Arthritis    Diabetes mellitus    Gout    Hyperlipidemia    Hypertension    Localized osteoarthritis of left knee 11/20/2012   Migraine headache with aura    Neuromuscular disorder (HCC)    " mild periferal neauropathy   Osteoarthritis of left hip 11/20/2012   PVC (premature ventricular contraction)    Sleep apnea    cpap    Past Surgical History:  Procedure Laterality Date   CHOLECYSTECTOMY  1997   CVA     CVA, occiptal   CVA     CVA, occipital   knee arthoscopy  2006   Left Knee   LEFT HEART CATH AND CORONARY ANGIOGRAPHY N/A 12/07/2017   Procedure: LEFT HEART CATH AND CORONARY ANGIOGRAPHY;  Surgeon: Lennette Bihari, MD;  Location: [facility] INVASIVE CV LAB;  Service: Cardiovascular;  Laterality: N/A;   left total hip     left unicarpmental knee     LOOP RECORDER IMPLANT N/A 04/19/2013   Procedure: LOOP RECORDER IMPLANT;  Surgeon: Marinus Maw, MD;  Location: [facility] CATH LAB;  Service: Cardiovascular;  Laterality: N/A;   PARTIAL KNEE ARTHROPLASTY Left 11/20/2012   Procedure: UNICOMPARTMENTAL KNEE;  Surgeon: Eulas Post, MD;  Location: [facility] OR;  Service: Orthopedics;  Laterality: Left;   REPLACEMENT UNICONDYLAR JOINT KNEE Left 11/20/2012   Dr Dion Saucier   TEE WITHOUT CARDIOVERSION N/A 04/19/2013   Procedure: TRANSESOPHAGEAL ECHOCARDIOGRAM (TEE);  Surgeon: Laurey Morale, MD;  Location: [facility]  ENDOSCOPY;  Service: Cardiovascular;  Laterality: N/A;   TONSILLECTOMY AND ADENOIDECTOMY     Childhood   TOTAL HIP ARTHROPLASTY Left 11/20/2012   Dr Dion Saucier   TOTAL HIP ARTHROPLASTY Left 11/20/2012   Procedure: TOTAL HIP ARTHROPLASTY;  Surgeon: Eulas Post, MD;  Location: [facility] OR;  Service: Orthopedics;  Laterality: Left;    Social History   Tobacco Use   Smoking status: Never   Smokeless tobacco: Never  Substance Use Topics   Alcohol use: No    Alcohol/week: 0.0 standard drinks of alcohol   Drug use: No    Family History   Problem Relation Age of Onset   Cancer Mother        Breast   Stroke Mother    Stroke Maternal Grandmother    Stroke Maternal Aunt    Stroke Maternal Uncle     Allergies  Allergen Reactions   Sulfa Antibiotics Rash and Other (See Comments)    Reaction not confirmed, but a possibility (from childhood)    Medication list has been reviewed and updated.  Current Outpatient Medications on File Prior to Visit  Medication Sig Dispense Refill   acetic acid 2 % otic solution Place 4 drops into both ears 4 (four) times daily. 15 mL 1   acetic acid-hydrocortisone (VOSOL-HC) OTIC solution Place 3 drops into both ears 2 (two) times daily. Use as needed for ear itching 10 mL 3   Alcohol Swabs PADS Use to test up to 4 times daily 100 each 2   ALEVE 220 MG tablet Take 220-440 mg by mouth 2 (two) times daily as needed (for headaches).     allopurinol (ZYLOPRIM) 300 MG tablet TAKE 1 TABLET BY MOUTH DAILY 90 tablet 1   blood glucose meter kit and supplies KIT Pt needs One Touch Ultra glucometer.  Use up to BID to check blood sugars 1 each 0   clopidogrel (PLAVIX) 75 MG tablet Take 1 tablet (75 mg total) by mouth daily. 90 tablet 3   diclofenac sodium (VOLTAREN) 1 % GEL Apply 2-4 g topically 2 (two) times daily as needed (for pain).     FARXIGA 10 MG TABS tablet TAKE 1 TABLET(10 MG) BY MOUTH DAILY BEFORE BREAKFAST 90 tablet 1   glucose blood (ONETOUCH ULTRA) test strip USE TO TEST UP TO FOUR TIMES DAILY 200 strip 12   hydrochlorothiazide (MICROZIDE) 12.5 MG capsule Take 1 capsule (12.5 mg total) by mouth daily. 90 capsule 3   Insulin Pen Needle (PEN NEEDLES) 32G X 5 MM MISC 1 each by Does not apply route daily. Use for basal insulin injection 100 each 6   LANTUS SOLOSTAR 100 UNIT/ML Solostar Pen ADMINISTER 30 UNITS UNDER THE SKIN DAILY 15 mL 5   losartan (COZAAR) 100 MG tablet TAKE 1 TABLET(100 MG) BY MOUTH DAILY 90 tablet 3   metFORMIN (GLUCOPHAGE-XR) 500 MG 24 hr tablet TAKE 2 TABLETS(1000 MG) BY  MOUTH TWICE DAILY 360 tablet 3   nebivolol (BYSTOLIC) 10 MG tablet TAKE 1 TABLET BY MOUTH EVERY EVENING 90 tablet 3   rOPINIRole (REQUIP) 0.25 MG tablet TAKE 2 TABLETS(0.5 MG) BY MOUTH AT BEDTIME AS NEEDED 180 tablet 0   rosuvastatin (CRESTOR) 5 MG tablet TAKE 1 TABLET BY MOUTH EVERY MONDAY, WEDNESDAY AND FRIDAY 39 tablet 2   Semaglutide, 2 MG/DOSE, 8 MG/3ML SOPN Inject 2 mg as directed once a week. 9 mL 3   SM LANCETS 33G MISC USE TO TEST BLOOD SUGAR TWICE DAILY 400 each PRN  tadalafil (CIALIS) 5 MG tablet Take 0.5 tablets (2.5 mg total) by mouth daily as needed for erectile dysfunction (for urinary dx). 45 tablet 3   zolpidem (AMBIEN) 5 MG tablet Take 1 tablet (5 mg total) by mouth at bedtime as needed for sleep. 30 tablet 1   No current facility-administered medications on file prior to visit.    Review of Systems:  As per HPI- otherwise negative.   Physical Examination: There were no vitals filed for this visit. There were no vitals filed for this visit. There is no height or weight on file to calculate BMI. Ideal Body Weight:    GEN: no acute distress. HEENT: Atraumatic, Normocephalic.  Ears and Nose: No external deformity. CV: RRR, No M/G/R. No JVD. No thrill. No extra heart sounds. PULM: CTA B, no wheezes, crackles, rhonchi. No retractions. No resp. distress. No accessory muscle use. ABD: S, NT, ND, +BS. No rebound. No HSM. EXTR: No c/c/e PSYCH: Normally interactive. Conversant.    Assessment and Plan: ***  Signed Abbe Amsterdam, MD

## 2023-01-16 ENCOUNTER — Encounter: Payer: Self-pay | Admitting: Family Medicine

## 2023-01-16 ENCOUNTER — Ambulatory Visit (HOSPITAL_BASED_OUTPATIENT_CLINIC_OR_DEPARTMENT_OTHER)
Admission: RE | Admit: 2023-01-16 | Discharge: 2023-01-16 | Disposition: A | Discharge status: Home / Self Care | Payer: Medicare Other | Source: Ambulatory Visit | Attending: Family Medicine | Admitting: Family Medicine

## 2023-01-16 ENCOUNTER — Ambulatory Visit (INDEPENDENT_AMBULATORY_CARE_PROVIDER_SITE_OTHER): Payer: Medicare Other | Admitting: Family Medicine

## 2023-01-16 VITALS — BP 138/82 | HR 73 | Resp 19 | Ht 72.0 in | Wt 216.8 lb

## 2023-01-16 DIAGNOSIS — E119 Type 2 diabetes mellitus without complications: Secondary | ICD-10-CM

## 2023-01-16 DIAGNOSIS — G4700 Insomnia, unspecified: Secondary | ICD-10-CM

## 2023-01-16 DIAGNOSIS — Z7985 Long-term (current) use of injectable non-insulin antidiabetic drugs: Secondary | ICD-10-CM

## 2023-01-16 DIAGNOSIS — I10 Essential (primary) hypertension: Secondary | ICD-10-CM

## 2023-01-16 DIAGNOSIS — E782 Mixed hyperlipidemia: Secondary | ICD-10-CM

## 2023-01-16 DIAGNOSIS — E1140 Type 2 diabetes mellitus with diabetic neuropathy, unspecified: Secondary | ICD-10-CM | POA: Diagnosis not present

## 2023-01-16 DIAGNOSIS — G629 Polyneuropathy, unspecified: Secondary | ICD-10-CM

## 2023-01-16 DIAGNOSIS — M25552 Pain in left hip: Secondary | ICD-10-CM | POA: Diagnosis not present

## 2023-01-16 DIAGNOSIS — Z96642 Presence of left artificial hip joint: Secondary | ICD-10-CM | POA: Diagnosis not present

## 2023-01-16 MED ORDER — ZOLPIDEM TARTRATE 5 MG PO TABS
5.0000 mg | ORAL_TABLET | Freq: Every evening | ORAL | 1 refills | Status: DC | PRN
Start: 2023-01-16 — End: 2023-08-07

## 2023-01-16 MED ORDER — METFORMIN HCL ER 500 MG PO TB24
1000.0000 mg | ORAL_TABLET | Freq: Two times a day (BID) | ORAL | 3 refills | Status: DC
Start: 2023-01-16 — End: 2024-03-12

## 2023-01-16 MED ORDER — LOSARTAN POTASSIUM 100 MG PO TABS
100.0000 mg | ORAL_TABLET | Freq: Every day | ORAL | 3 refills | Status: DC
Start: 2023-01-16 — End: 2024-03-12

## 2023-01-16 MED ORDER — HYDROCHLOROTHIAZIDE 12.5 MG PO CAPS
12.5000 mg | ORAL_CAPSULE | Freq: Every day | ORAL | 3 refills | Status: DC
Start: 2023-01-16 — End: 2024-03-12

## 2023-01-16 NOTE — Patient Instructions (Addendum)
It was great to see you again as always!  Please let me know what happens with Phillip [name]- I hope you make progress with the sleep people over at [facility]  I ordered your x-[facility] to [facility]   I will be in touch with your labs asap

## 2023-01-17 ENCOUNTER — Encounter: Payer: Self-pay | Admitting: Family Medicine

## 2023-01-17 LAB — COMPREHENSIVE METABOLIC PANEL
ALT: 24 U/L (ref 0–53)
AST: 17 U/L (ref 0–37)
Albumin: 4 g/dL (ref 3.5–5.2)
Alkaline Phosphatase: 61 U/L (ref 39–117)
BUN: 18 mg/dL (ref 6–23)
CO2: 29 meq/L (ref 19–32)
Calcium: 10.1 mg/dL (ref 8.4–10.5)
Chloride: 101 meq/L (ref 96–112)
Creatinine, Ser: 0.93 mg/dL (ref 0.40–1.50)
GFR: 81.17 mL/min (ref >60.00)
Glucose, Bld: 147 mg/dL — ABNORMAL HIGH (ref 70–99)
Potassium: 4 meq/L (ref 3.5–5.1)
Sodium: 137 meq/L (ref 135–145)
Total Bilirubin: 0.3 mg/dL (ref 0.2–1.2)
Total Protein: 6.9 g/dL (ref 6.0–8.3)

## 2023-01-17 LAB — CBC
HCT: 41.8 % (ref 39.0–52.0)
Hemoglobin: 13.5 g/dL (ref 13.0–17.0)
MCHC: 32.2 g/dL (ref 30.0–36.0)
MCV: 91.8 fl (ref 78.0–100.0)
Platelets: 290 10*3/uL (ref 150.0–400.0)
RBC: 4.55 Mil/uL (ref 4.22–5.81)
RDW: 12.8 % (ref 11.5–15.5)
WBC: 5.3 10*3/uL (ref 4.0–10.5)

## 2023-01-17 LAB — HEMOGLOBIN A1C: Hgb A1c MFr Bld: 8.3 % — ABNORMAL HIGH (ref 4.6–6.5)

## 2023-01-17 LAB — VITAMIN B12: Vitamin B-12: 267 pg/mL (ref 211–911)

## 2023-01-31 ENCOUNTER — Encounter: Payer: Self-pay | Admitting: Family Medicine

## 2023-02-07 ENCOUNTER — Ambulatory Visit: Payer: Medicare Other | Admitting: Neurology

## 2023-02-14 ENCOUNTER — Ambulatory Visit (INDEPENDENT_AMBULATORY_CARE_PROVIDER_SITE_OTHER): Payer: Medicare Other | Admitting: *Deleted

## 2023-02-14 DIAGNOSIS — Z0000 Encounter for general adult medical examination without abnormal findings: Secondary | ICD-10-CM | POA: Diagnosis not present

## 2023-02-14 NOTE — Progress Notes (Signed)
Subjective:   Phillip [name] is a 72 y.o. male who presents for Medicare Annual/Subsequent preventive examination.  Visit Complete: Virtual I connected with  Phillip [name] on 02/14/23 by a audio enabled telemedicine application and verified that I am speaking with the correct person using two identifiers.  Patient Location: Home  Provider Location: Office/Clinic  I discussed the limitations of evaluation and management by telemedicine. The patient expressed understanding and agreed to proceed.  Vital Signs: Because this visit was a virtual/telehealth visit, some criteria may be missing or patient reported. Any vitals not documented were not able to be obtained and vitals that have been documented are patient reported.  Patient Medicare AWV questionnaire was completed by the patient on 02/10/23; I have confirmed that all information answered by patient is correct and no changes since this date.  Cardiac Risk Factors include: advanced age (>69men, >24 women);male gender;diabetes mellitus;dyslipidemia;hypertension     Objective:    There were no vitals filed for this visit. There is no height or weight on file to calculate BMI.     02/14/2023    4:05 PM 02/16/2022   11:11 AM 10/14/2021    3:05 PM 02/12/2021    9:28 AM 12/07/2017    8:55 AM 04/17/2013    6:00 PM 11/20/2012    4:05 PM  Advanced Directives  Does Patient Have a Medical Advance Directive? Yes Yes Yes No Yes Patient does not have advance directive;Patient would not like information Patient does not have advance directive  Type of Advance Directive Healthcare Power of [place];Living will  Healthcare Power of [place];Out of facility DNR (pink MOST or yellow form);Living will  Healthcare Power of [place];Living will    Does patient want to make changes to medical advance directive? No - Patient declined  No - Patient declined  No - Patient declined    Copy of Healthcare Power of Attorney in Chart? No - copy requested  No  - copy requested  No - copy requested    Would patient like information on creating a medical advance directive?   No - Patient declined      Pre-existing out of facility DNR order (yellow form or pink MOST form)      No     Current Medications (verified) Outpatient Encounter Medications as of 02/14/2023  Medication Sig   acetic acid-hydrocortisone (VOSOL-HC) OTIC solution Place 3 drops into both ears 2 (two) times daily. Use as needed for ear itching   Alcohol Swabs PADS Use to test up to 4 times daily   ALEVE 220 MG tablet Take 220-440 mg by mouth 2 (two) times daily as needed (for headaches).   allopurinol (ZYLOPRIM) 300 MG tablet Take 150 mg by mouth daily.   blood glucose meter kit and supplies KIT Pt needs One Touch Ultra glucometer.  Use up to BID to check blood sugars   clopidogrel (PLAVIX) 75 MG tablet Take 1 tablet (75 mg total) by mouth daily.   diclofenac sodium (VOLTAREN) 1 % GEL Apply 2-4 g topically 2 (two) times daily as needed (for pain).   FARXIGA 10 MG TABS tablet TAKE 1 TABLET(10 MG) BY MOUTH DAILY BEFORE BREAKFAST   glucose blood (ONETOUCH ULTRA) test strip USE TO TEST UP TO FOUR TIMES DAILY   hydrochlorothiazide (MICROZIDE) 12.5 MG capsule Take 1 capsule (12.5 mg total) by mouth daily.   Insulin Pen Needle (PEN NEEDLES) 32G X 5 MM MISC 1 each by Does not apply route daily. Use for basal  insulin injection   LANTUS SOLOSTAR 100 UNIT/ML Solostar Pen ADMINISTER 30 UNITS UNDER THE SKIN DAILY   losartan (COZAAR) 100 MG tablet Take 1 tablet (100 mg total) by mouth daily.   metFORMIN (GLUCOPHAGE-XR) 500 MG 24 hr tablet Take 2 tablets (1,000 mg total) by mouth 2 (two) times daily with a meal.   nebivolol (BYSTOLIC) 10 MG tablet TAKE 1 TABLET BY MOUTH EVERY EVENING   rOPINIRole (REQUIP) 0.25 MG tablet TAKE 2 TABLETS(0.5 MG) BY MOUTH AT BEDTIME AS NEEDED   rosuvastatin (CRESTOR) 5 MG tablet TAKE 1 TABLET BY MOUTH EVERY MONDAY, WEDNESDAY AND FRIDAY   Semaglutide, 2 MG/DOSE, 8  MG/3ML SOPN Inject 2 mg as directed once a week.   SM LANCETS 33G MISC USE TO TEST BLOOD SUGAR TWICE DAILY   tadalafil (CIALIS) 5 MG tablet Take 0.5 tablets (2.5 mg total) by mouth daily as needed for erectile dysfunction (for urinary dx). (Patient taking differently: Take 2.5 mg by mouth daily as needed (for urinary dx). BPH)   zolpidem (AMBIEN) 5 MG tablet Take 1 tablet (5 mg total) by mouth at bedtime as needed for sleep.   [DISCONTINUED] acetic acid 2 % otic solution Place 4 drops into both ears 4 (four) times daily.   [DISCONTINUED] allopurinol (ZYLOPRIM) 300 MG tablet TAKE 1 TABLET BY MOUTH DAILY (Patient taking differently: Take 150 mg by mouth daily.)   No facility-administered encounter medications on file as of 02/14/2023.    Allergies (verified) Sulfa antibiotics   History: Past Medical History:  Diagnosis Date   Allergy 1955   Sulfa   Anemia    Arthritis    Diabetes mellitus    Gout    Hyperlipidemia    Hypertension    Localized osteoarthritis of left knee 11/20/2012   Migraine headache with aura    Neuromuscular disorder (HCC)    " mild periferal neauropathy   Osteoarthritis of left hip 11/20/2012   PVC (premature ventricular contraction)    Sleep apnea    cpap   Stroke (HCC) 04/2013   Past Surgical History:  Procedure Laterality Date   CHOLECYSTECTOMY  05/03/1995   CVA     CVA, occiptal   CVA     CVA, occipital   JOINT REPLACEMENT  2014   knee arthoscopy  05/02/2004   Left Knee   LEFT HEART CATH AND CORONARY ANGIOGRAPHY N/A 12/07/2017   Procedure: LEFT HEART CATH AND CORONARY ANGIOGRAPHY;  Surgeon: Lennette Bihari, MD;  Location: [facility] INVASIVE CV LAB;  Service: Cardiovascular;  Laterality: N/A;   left total hip     left unicarpmental knee     LOOP RECORDER IMPLANT N/A 04/19/2013   Procedure: LOOP RECORDER IMPLANT;  Surgeon: Marinus Maw, MD;  Location: [facility] CATH LAB;  Service: Cardiovascular;  Laterality: N/A;   PARTIAL KNEE ARTHROPLASTY Left 11/20/2012    Procedure: UNICOMPARTMENTAL KNEE;  Surgeon: Eulas Post, MD;  Location: [facility] OR;  Service: Orthopedics;  Laterality: Left;   REPLACEMENT UNICONDYLAR JOINT KNEE Left 11/20/2012   Phillip Dion Saucier   TEE WITHOUT CARDIOVERSION N/A 04/19/2013   Procedure: TRANSESOPHAGEAL ECHOCARDIOGRAM (TEE);  Surgeon: Laurey Morale, MD;  Location: [facility] ENDOSCOPY;  Service: Cardiovascular;  Laterality: N/A;   TONSILLECTOMY AND ADENOIDECTOMY     Childhood   TOTAL HIP ARTHROPLASTY Left 11/20/2012   Phillip Dion Saucier   TOTAL HIP ARTHROPLASTY Left 11/20/2012   Procedure: TOTAL HIP ARTHROPLASTY;  Surgeon: Eulas Post, MD;  Location: [facility] OR;  Service: Orthopedics;  Laterality: Left;  Family History  Problem Relation Age of Onset   Cancer Mother        Breast   Stroke Mother    Diabetes Mother    Diabetes Father    Hearing loss Father    Stroke Maternal Grandmother    Stroke Maternal Aunt    Stroke Maternal Uncle    Diabetes Sister    Hypertension Sister    Diabetes Brother    Hearing loss Brother    Heart disease Brother    Hypertension Brother    Intellectual disability Daughter    Learning disabilities Daughter    Obesity Daughter    Varicose Veins Daughter    Miscarriages / [place] Daughter    Miscarriages / [place] Daughter    Social History   Socioeconomic History   Marital status: Married    Spouse name: Warden Fillers   Number of children: 4   Years of education: 23   Highest education level: Doctorate  Occupational History   Occupation: MD    Employer: [employer]  Tobacco Use   Smoking status: Never   Smokeless tobacco: Never  Substance and Sexual Activity   Alcohol use: No    Alcohol/week: 0.0 standard drinks of alcohol   Drug use: Never   Sexual activity: Yes    Birth control/protection: Post-menopausal    Comment: is it safe for me to try viagra?  Other Topics Concern   Not on file  Social History Narrative   Patient lives at home with family.   Caffeine Use: occasionally tea    Social Determinants of Health   Financial Resource Strain: Low Risk  (02/10/2023)   Overall Financial Resource Strain (CARDIA)    Difficulty of Paying Living Expenses: Not hard at all  Food Insecurity: No Food Insecurity (02/10/2023)   Hunger Vital Sign    Worried About Running Out of Food in the Last Year: Never true    Ran Out of Food in the Last Year: Never true  Transportation Needs: No Transportation Needs (10/31/2022)   PRAPARE - Administrator, Civil Service (Medical): No    Lack of Transportation (Non-Medical): No  Physical Activity: Sufficiently Active (02/10/2023)   Exercise Vital Sign    Days of Exercise per Week: 5 days    Minutes of Exercise per Session: 50 min  Stress: No Stress Concern Present (02/10/2023)   [employer] of Occupational Health - Occupational Stress Questionnaire    Feeling of Stress : Only a little  Social Connections: Socially Integrated (02/10/2023)   Social Connection and Isolation Panel [NHANES]    Frequency of Communication with Friends and Family: More than three times a week    Frequency of Social Gatherings with Friends and Family: More than three times a week    Attends Religious Services: More than 4 times per year    Active Member of [employer] or Organizations: Yes    Attends Engineer, structural: More than 4 times per year    Marital Status: Married    Tobacco Counseling Counseling given: Not Answered   Clinical Intake:  Pre-visit preparation completed: Yes  Pain : No/denies pain  Nutritional Risks: None Diabetes: Yes CBG done?: No Did pt. bring in CBG monitor from home?: No  How often do you need to have someone help you when you read instructions, pamphlets, or other written materials from your doctor or pharmacy?: 1 - Never  Interpreter Needed?: No  Information entered by :: Donne Anon, CMA   Activities  of Daily Living    02/10/2023   11:04 AM 01/17/2023    9:48 AM  In your present state of  health, do you have any difficulty performing the following activities:  Hearing? 1 0  Vision? 0 0  Difficulty concentrating or making decisions? 0 0  Walking or climbing stairs? 1 1  Comment left knee pain   Dressing or bathing? 0 0  Doing errands, shopping? 0 0  Preparing Food and eating ? N N  Using the Toilet? N N  In the past six months, have you accidently leaked urine? Y Y  Do you have problems with loss of bowel control? N N  Managing your Medications? N N  Managing your Finances? N N  Housekeeping or managing your Housekeeping? N N    Patient Care Team: Copland, Gwenlyn Found, MD as PCP - General (Family Medicine)  Indicate any recent Medical Services you may have received from other than [facility] providers in the past year (date may be approximate).     Assessment:   This is a routine wellness examination for [place].  Hearing/Vision screen No results found.   Goals Addressed   None    Depression Screen    02/14/2023    4:06 PM 10/14/2021    3:06 PM 05/19/2021    4:00 PM 04/27/2020    9:06 AM 01/16/2017    9:00 AM 10/30/2015    3:12 PM 07/21/2015    2:26 PM  PHQ 2/9 Scores  PHQ - 2 Score 0 0 0 0 0 0 0  Exception Documentation     Patient refusal      Fall Risk    02/10/2023   11:04 AM 01/17/2023    9:48 AM 10/14/2021    3:06 PM 05/19/2021    4:00 PM 04/27/2020    9:05 AM  Fall Risk   Falls in the past year? 1 1 0 0 0  Number falls in past yr: 1 1 0 0 0  Injury with Fall? 1 1 0 0 0  Risk for fall due to : History of fall(s);Impaired balance/gait  No Fall Risks    Follow up Falls evaluation completed  Falls evaluation completed  Falls evaluation completed    MEDICARE RISK AT HOME: Medicare Risk at Home Any stairs in or around the home?: Yes If so, are there any without handrails?: No Home free of loose throw rugs in walkways, pet beds, electrical cords, etc?: No Adequate lighting in your home to reduce risk of falls?: Yes Life alert?: No Use of a cane,  walker or w/c?: No Grab bars in the bathroom?: No Shower chair or bench in shower?: Yes Elevated toilet seat or a handicapped toilet?: No  TIMED UP AND GO:  Was the test performed?  No    Cognitive Function:        02/14/2023    4:18 PM 10/14/2021    3:15 PM  6CIT Screen  What Year? 0 points 0 points  What month? 0 points 0 points  What time? 0 points 0 points  Count back from 20 0 points 0 points  Months in reverse 0 points 0 points  Repeat phrase 0 points 0 points  Total Score 0 points 0 points    Immunizations Immunization History  Administered Date(s) Administered   Fluad Quad(high Dose 65+) 01/25/2019, 01/21/2020   Influenza Split 02/01/2012   Influenza, High Dose Seasonal PF 01/16/2017, 01/10/2023   Influenza-Unspecified 01/24/2014, 12/31/2020, 01/13/2022   PFIZER(Purple Top)SARS-COV-2 Vaccination  05/01/2019, 05/21/2019, 01/01/2020, 07/31/2020   PPD Test 01/31/2014   Pneumococcal Conjugate-13 07/25/2014   Pneumococcal Polysaccharide-23 04/20/2016   Tdap 04/20/2016   Typhoid Live 01/16/2017   Unspecified SARS-COV-2 Vaccination 01/10/2023   Zoster, Live 06/11/2010    TDAP status: Up to date  Flu Vaccine status: Up to date  Pneumococcal vaccine status: Up to date  Covid-19 vaccine status: Information provided on how to obtain vaccines.   Qualifies for Shingles Vaccine? Yes   Zostavax completed Yes   Shingrix Completed?: No.    Education has been provided regarding the importance of this vaccine. Patient has been advised to call insurance company to determine out of pocket expense if they have not yet received this vaccine. Advised may also receive vaccine at local pharmacy or Health Dept. Verbalized acceptance and understanding.  Screening Tests Health Maintenance  Topic Date Due   Zoster Vaccines- Shingrix (1 of 2) 01/14/1999   Medicare Annual Wellness (AWV)  10/15/2022   FOOT EXAM  11/18/2022   COVID-19 Vaccine (6 - 2023-24 season) 03/07/2023    Diabetic kidney evaluation - Urine ACR  05/05/2023   OPHTHALMOLOGY EXAM  07/12/2023   HEMOGLOBIN A1C  07/16/2023   Diabetic kidney evaluation - eGFR measurement  01/16/2024   Colonoscopy  10/19/2025   DTaP/Tdap/Td (2 - Td or Tdap) 04/20/2026   Pneumonia Vaccine 82+ Years old  Completed   INFLUENZA VACCINE  Completed   Hepatitis C Screening  Completed   HPV VACCINES  Aged Out    Health Maintenance  Health Maintenance Due  Topic Date Due   Zoster Vaccines- Shingrix (1 of 2) 01/14/1999   Medicare Annual Wellness (AWV)  10/15/2022   FOOT EXAM  11/18/2022    Colorectal cancer screening: Type of screening: Colonoscopy. Completed 10/20/15. Repeat every 10 years  Lung Cancer Screening: (Low Dose CT Chest recommended if Age 80-80 years, 20 pack-year currently smoking OR have quit w/in 15years.) does not qualify.   Additional Screening:  Hepatitis C Screening: does qualify; Completed 04/20/16  Vision Screening: Recommended annual ophthalmology exams for early detection of glaucoma and other disorders of the eye. Is the patient up to date with their annual eye exam?  Yes  Who is the provider or what is the name of the office in which the patient attends annual eye exams? [facility] If pt is not established with a provider, would they like to be referred to a provider to establish care? No .   Dental Screening: Recommended annual dental exams for proper oral hygiene  Diabetic Foot Exam: Diabetic Foot Exam: Overdue, Pt has been advised about the importance in completing this exam. Pt is scheduled for diabetic foot exam on N/a.  Community Resource Referral / Chronic Care Management: CRR required this visit?  No   CCM required this visit?  No     Plan:     I have personally reviewed and noted the following in the patient's chart:   Medical and social history Use of alcohol, tobacco or illicit drugs  Current medications and supplements including opioid prescriptions. Patient is  not currently taking opioid prescriptions. Functional ability and status Nutritional status Physical activity Advanced directives List of other physicians Hospitalizations, surgeries, and ER visits in previous 12 months Vitals Screenings to include cognitive, depression, and falls Referrals and appointments  In addition, I have reviewed and discussed with patient certain preventive protocols, quality metrics, and best practice recommendations. A written personalized care plan for preventive services as well as general preventive  health recommendations were provided to patient.     Donne Anon, CMA   02/14/2023   After Visit Summary: (MyChart) Due to this being a telephonic visit, the after visit summary with patients personalized plan was offered to patient via MyChart   Nurse Notes: None

## 2023-02-14 NOTE — Patient Instructions (Signed)
Phillip [name] , Thank you for taking time to come for your Medicare Wellness Visit. I appreciate your ongoing commitment to your health goals. Please review the following plan we discussed and let me know if I can assist you in the future.   These are the goals we discussed:  Goals   None     This is a list of the screening recommended for you and due dates:  Health Maintenance  Topic Date Due   Zoster (Shingles) Vaccine (1 of 2) 01/14/1999   Complete foot exam   11/18/2022   COVID-19 Vaccine (6 - 2023-24 season) 03/07/2023   Yearly kidney health urinalysis for diabetes  05/05/2023   Eye exam for diabetics  07/12/2023   Hemoglobin A1C  07/16/2023   Yearly kidney function blood test for diabetes  01/16/2024   Medicare Annual Wellness Visit  02/14/2024   Colon Cancer Screening  10/19/2025   DTaP/Tdap/Td vaccine (2 - Td or Tdap) 04/20/2026   Pneumonia Vaccine  Completed   Flu Shot  Completed   Hepatitis C Screening  Completed   HPV Vaccine  Aged Out     Next appointment: Follow up in one year for your annual wellness visit.   Preventive Care 75 Years and Older, Male Preventive care refers to lifestyle choices and visits with your health care provider that can promote health and wellness. What does preventive care include? A yearly physical exam. This is also called an annual well check. Dental exams once or twice a year. Routine eye exams. Ask your health care provider how often you should have your eyes checked. Personal lifestyle choices, including: Daily care of your teeth and gums. Regular physical activity. Eating a healthy diet. Avoiding tobacco and drug use. Limiting alcohol use. Practicing safe sex. Taking low doses of aspirin every day. Taking vitamin and mineral supplements as recommended by your health care provider. What happens during an annual well check? The services and screenings done by your health care provider during your annual well check will depend on  your age, overall health, lifestyle risk factors, and family history of disease. Counseling  Your health care provider may ask you questions about your: Alcohol use. Tobacco use. Drug use. Emotional well-being. Home and relationship well-being. Sexual activity. Eating habits. History of falls. Memory and ability to understand (cognition). Work and work Astronomer. Screening  You may have the following tests or measurements: Height, weight, and BMI. Blood pressure. Lipid and cholesterol levels. These may be checked every 5 years, or more frequently if you are over 63 years old. Skin check. Lung cancer screening. You may have this screening every year starting at age 4 if you have a 30-pack-year history of smoking and currently smoke or have quit within the past 15 years. Fecal occult blood test (FOBT) of the stool. You may have this test every year starting at age 16. Flexible sigmoidoscopy or colonoscopy. You may have a sigmoidoscopy every 5 years or a colonoscopy every 10 years starting at age 31. Prostate cancer screening. Recommendations will vary depending on your family history and other risks. Hepatitis C blood test. Hepatitis B blood test. Sexually transmitted disease (STD) testing. Diabetes screening. This is done by checking your blood sugar (glucose) after you have not eaten for a while (fasting). You may have this done every 1-3 years. Abdominal aortic aneurysm (AAA) screening. You may need this if you are a current or former smoker. Osteoporosis. You may be screened starting at age 16 if you are  at high risk. Talk with your health care provider about your test results, treatment options, and if necessary, the need for more tests. Vaccines  Your health care provider may recommend certain vaccines, such as: Influenza vaccine. This is recommended every year. Tetanus, diphtheria, and acellular pertussis (Tdap, Td) vaccine. You may need a Td booster every 10 years. Zoster  vaccine. You may need this after age 13. Pneumococcal 13-valent conjugate (PCV13) vaccine. One dose is recommended after age 99. Pneumococcal polysaccharide (PPSV23) vaccine. One dose is recommended after age 78. Talk to your health care provider about which screenings and vaccines you need and how often you need them. This information is not intended to replace advice given to you by your health care provider. Make sure you discuss any questions you have with your health care provider. Document Released: 05/15/2015 Document Revised: 01/06/2016 Document Reviewed: 02/17/2015 Elsevier Interactive Patient Education  2017 [employer].  Fall Prevention in the Home Falls can cause injuries. They can happen to people of all ages. There are many things you can do to make your home safe and to help prevent falls. What can I do on the outside of my home? Regularly fix the edges of walkways and driveways and fix any cracks. Remove anything that might make you trip as you walk through a door, such as a raised step or threshold. Trim any bushes or trees on the path to your home. Use bright outdoor lighting. Clear any walking paths of anything that might make someone trip, such as rocks or tools. Regularly check to see if handrails are loose or broken. Make sure that both sides of any steps have handrails. Any raised decks and porches should have guardrails on the edges. Have any leaves, snow, or ice cleared regularly. Use sand or salt on walking paths during winter. Clean up any spills in your garage right away. This includes oil or grease spills. What can I do in the bathroom? Use night lights. Install grab bars by the toilet and in the tub and shower. Do not use towel bars as grab bars. Use non-skid mats or decals in the tub or shower. If you need to sit down in the shower, use a plastic, non-slip stool. Keep the floor dry. Clean up any water that spills on the floor as soon as it happens. Remove  soap buildup in the tub or shower regularly. Attach bath mats securely with double-sided non-slip rug tape. Do not have throw rugs and other things on the floor that can make you trip. What can I do in the bedroom? Use night lights. Make sure that you have a light by your bed that is easy to reach. Do not use any sheets or blankets that are too big for your bed. They should not hang down onto the floor. Have a firm chair that has side arms. You can use this for support while you get dressed. Do not have throw rugs and other things on the floor that can make you trip. What can I do in the kitchen? Clean up any spills right away. Avoid walking on wet floors. Keep items that you use a lot in easy-to-reach places. If you need to reach something above you, use a strong step stool that has a grab bar. Keep electrical cords out of the way. Do not use floor polish or wax that makes floors slippery. If you must use wax, use non-skid floor wax. Do not have throw rugs and other things on the floor  that can make you trip. What can I do with my stairs? Do not leave any items on the stairs. Make sure that there are handrails on both sides of the stairs and use them. Fix handrails that are broken or loose. Make sure that handrails are as long as the stairways. Check any carpeting to make sure that it is firmly attached to the stairs. Fix any carpet that is loose or worn. Avoid having throw rugs at the top or bottom of the stairs. If you do have throw rugs, attach them to the floor with carpet tape. Make sure that you have a light switch at the top of the stairs and the bottom of the stairs. If you do not have them, ask someone to add them for you. What else can I do to help prevent falls? Wear shoes that: Do not have high heels. Have rubber bottoms. Are comfortable and fit you well. Are closed at the toe. Do not wear sandals. If you use a stepladder: Make sure that it is fully opened. Do not climb a  closed stepladder. Make sure that both sides of the stepladder are locked into place. Ask someone to hold it for you, if possible. Clearly mark and make sure that you can see: Any grab bars or handrails. First and last steps. Where the edge of each step is. Use tools that help you move around (mobility aids) if they are needed. These include: Canes. Walkers. Scooters. Crutches. Turn on the lights when you go into a dark area. Replace any light bulbs as soon as they burn out. Set up your furniture so you have a clear path. Avoid moving your furniture around. If any of your floors are uneven, fix them. If there are any pets around you, be aware of where they are. Review your medicines with your doctor. Some medicines can make you feel dizzy. This can increase your chance of falling. Ask your doctor what other things that you can do to help prevent falls. This information is not intended to replace advice given to you by your health care provider. Make sure you discuss any questions you have with your health care provider. Document Released: 02/12/2009 Document Revised: 09/24/2015 Document Reviewed: 05/23/2014 Elsevier Interactive Patient Education  2017 [employer].

## 2023-02-22 ENCOUNTER — Encounter: Payer: Self-pay | Admitting: Family Medicine

## 2023-03-15 ENCOUNTER — Other Ambulatory Visit: Payer: Self-pay | Admitting: Family Medicine

## 2023-03-15 DIAGNOSIS — G2581 Restless legs syndrome: Secondary | ICD-10-CM

## 2023-03-20 ENCOUNTER — Other Ambulatory Visit: Payer: Self-pay | Admitting: Family Medicine

## 2023-03-20 DIAGNOSIS — E1140 Type 2 diabetes mellitus with diabetic neuropathy, unspecified: Secondary | ICD-10-CM

## 2023-03-21 ENCOUNTER — Encounter: Payer: Self-pay | Admitting: Family Medicine

## 2023-03-21 DIAGNOSIS — E1140 Type 2 diabetes mellitus with diabetic neuropathy, unspecified: Secondary | ICD-10-CM

## 2023-03-21 MED ORDER — OZEMPIC (2 MG/DOSE) 8 MG/3ML ~~LOC~~ SOPN
2.0000 mg | PEN_INJECTOR | SUBCUTANEOUS | 3 refills | Status: DC
Start: 2023-03-21 — End: 2024-01-24

## 2023-03-21 NOTE — Telephone Encounter (Signed)
Looks like Rx was sent to [facility] yesterday 03/20/23: Not sure if this is the correct quantity though.   Dose: 2 mg Route: Subcutaneous Frequency: Weekly  Dispense Quantity: 9 mL Refills: 1        Sig: Inject 2 mg into the skin once a week.       Start Date: 03/20/23 End Date: --  Written Date: 03/20/23 Expiration Date: 03/19/24     Associated Diagnoses: Type 2 diabetes mellitus with diabetic neuropathy, without long-term current use of insulin (HCC) [E11.40]  Original Order: Semaglutide, 2 MG/DOSE, 8 MG/3ML SOPN [[id number]]  Providers  Ordering and Authorizing Provider: Pearline Cables, MD [address], [place] [address] Phone: [phone]   Fax: [phone] DEA #: [id number]   NPI: [id number]      Ordering User: Conrad Wixom, CMA      Pharmacy  [id number] - [place], [place] - [address] AT [facility] [address] [place] [address] Phone: [phone]  Fax: [phone] DEA #: [id number]  DAW Reason: --

## 2023-04-03 DIAGNOSIS — G4733 Obstructive sleep apnea (adult) (pediatric): Secondary | ICD-10-CM | POA: Diagnosis not present

## 2023-04-11 ENCOUNTER — Ambulatory Visit: Payer: Medicare Other | Admitting: Neurology

## 2023-04-11 ENCOUNTER — Encounter: Payer: Self-pay | Admitting: Neurology

## 2023-04-11 VITALS — BP 131/70 | HR 63 | Ht 72.0 in | Wt 225.2 lb

## 2023-04-11 DIAGNOSIS — R002 Palpitations: Secondary | ICD-10-CM | POA: Diagnosis not present

## 2023-04-11 DIAGNOSIS — G4733 Obstructive sleep apnea (adult) (pediatric): Secondary | ICD-10-CM | POA: Diagnosis not present

## 2023-04-11 DIAGNOSIS — G2581 Restless legs syndrome: Secondary | ICD-10-CM | POA: Diagnosis not present

## 2023-04-11 DIAGNOSIS — Z9989 Dependence on other enabling machines and devices: Secondary | ICD-10-CM | POA: Diagnosis not present

## 2023-04-11 NOTE — Patient Instructions (Signed)
Machine was issued in 2022 ( HST was that year ) . Needs yearly compliance visit.
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
## 2023-05-04 DIAGNOSIS — G4733 Obstructive sleep apnea (adult) (pediatric): Secondary | ICD-10-CM | POA: Diagnosis not present

## 2023-05-08 ENCOUNTER — Encounter: Payer: Self-pay | Admitting: Family Medicine

## 2023-05-08 DIAGNOSIS — Z2989 Encounter for other specified prophylactic measures: Secondary | ICD-10-CM

## 2023-05-08 DIAGNOSIS — I10 Essential (primary) hypertension: Secondary | ICD-10-CM

## 2023-05-08 DIAGNOSIS — Z23 Encounter for immunization: Secondary | ICD-10-CM

## 2023-05-08 MED ORDER — DOXYCYCLINE HYCLATE 100 MG PO CAPS
100.0000 mg | ORAL_CAPSULE | Freq: Every day | ORAL | 0 refills | Status: DC
Start: 2023-05-08 — End: 2023-09-20

## 2023-05-08 MED ORDER — TYPHOID VACCINE PO CPDR
1.0000 | DELAYED_RELEASE_CAPSULE | ORAL | 0 refills | Status: DC
Start: 2023-05-08 — End: 2023-07-19

## 2023-05-08 MED ORDER — NEBIVOLOL HCL 10 MG PO TABS: 10.0000 mg | ORAL_TABLET | Freq: Two times a day (BID) | ORAL | 3 refills | Status: AC

## 2023-05-16 DIAGNOSIS — K080 Exfoliation of teeth due to systemic causes: Secondary | ICD-10-CM | POA: Diagnosis not present

## 2023-05-21 NOTE — Progress Notes (Unsigned)
[name] [address] [place], [place] [address] [phone]  Date:  05/22/2023   Name:  Phillip [name]   DOB:  02-17-1951   MRN:  [id number]  PCP:  Pearline Cables, MD    Chief Complaint: No chief complaint on file.   History of Present Illness:  Phillip [name] is a 73 y.o. very pleasant male patient who presents with the following:  Patient seen today for periodic follow-up- History of diabetes, sleep apnea on CPAP, diabetic neuropathy, CAD, migraine headache, ischemic stroke in 2014 with no residual  Most recent visit with myself was in September-at that time he was doing pretty well.  He noted a bit more difficulty with balance and is started using walking poles when exercising on uneven ground  I got x-rays of his hip at last visit.  Status post left hip replacement, right hip degenerative changes  Lab Results  Component Value Date   HGBA1C 8.3 (H) 01/16/2023   Due for foot exam Can update urine micro Eye exam appears to be due in March  Plavix HCTZ 12.5 Losartan 100 Metformin 1000 twice daily Bystolic 10 mg twice daily Crestor Ozempic Farxiga 10    Patient Active Problem List   Diagnosis Date Noted   RLS (restless legs syndrome) 04/11/2023   Atrial tachycardia (HCC) 04/22/2022   Palpitations 02/18/2022   Severe obstructive sleep apnea-hypopnea syndrome 07/01/2020   Dependence on CPAP ventilation 07/01/2020   Type 2 diabetes mellitus with diabetic neuropathy, without long-term current use of insulin (HCC) 09/02/2019   OSA and COPD overlap syndrome (HCC) 09/02/2019   Abnormal hemoglobin (HCC) 09/02/2019   OSA on CPAP 08/12/2015   Ventricular tachycardia (HCC) 11/10/2014   CAD (coronary artery disease), native coronary artery    Migraine variant 08/14/2013   History of ischemic stroke without residual deficits 04/17/2013   ED (erectile dysfunction) 02/01/2012   Gout 09/05/2011    Hyperlipidemia 09/05/2011   Hypertriglyceridemia 09/05/2011   PVC (premature ventricular contraction) 09/05/2011   Essential hypertension, benign 06/01/2011    Past Medical History:  Diagnosis Date   Allergy 1955   Sulfa   Anemia    Arthritis    Diabetes mellitus    Gout    Hyperlipidemia    Hypertension    Localized osteoarthritis of left knee 11/20/2012   Migraine headache with aura    Neuromuscular disorder (HCC)    " mild periferal neauropathy   Osteoarthritis of left hip 11/20/2012   PVC (premature ventricular contraction)    Sleep apnea    cpap   Stroke (HCC) 04/2013    Past Surgical History:  Procedure Laterality Date   CHOLECYSTECTOMY  05/03/1995   CVA     CVA, occiptal   CVA     CVA, occipital   JOINT REPLACEMENT  2014   knee arthoscopy  05/02/2004   Left Knee   LEFT HEART CATH AND CORONARY ANGIOGRAPHY N/A 12/07/2017   Procedure: LEFT HEART CATH AND CORONARY ANGIOGRAPHY;  Surgeon: Lennette Bihari, MD;  Location: [facility] INVASIVE CV LAB;  Service: Cardiovascular;  Laterality: N/A;   left total hip     left unicarpmental knee     LOOP RECORDER IMPLANT N/A 04/19/2013   Procedure: LOOP RECORDER IMPLANT;  Surgeon: Marinus Maw, MD;  Location: [facility] CATH LAB;  Service: Cardiovascular;  Laterality: N/A;   PARTIAL KNEE ARTHROPLASTY Left 11/20/2012   Procedure: UNICOMPARTMENTAL KNEE;  Surgeon: Ivin Booty  Robie Ridge, MD;  Location: [facility] OR;  Service: Orthopedics;  Laterality: Left;   REPLACEMENT UNICONDYLAR JOINT KNEE Left 11/20/2012   Dr Dion Saucier   TEE WITHOUT CARDIOVERSION N/A 04/19/2013   Procedure: TRANSESOPHAGEAL ECHOCARDIOGRAM (TEE);  Surgeon: Laurey Morale, MD;  Location: [facility] ENDOSCOPY;  Service: Cardiovascular;  Laterality: N/A;   TONSILLECTOMY AND ADENOIDECTOMY     Childhood   TOTAL HIP ARTHROPLASTY Left 11/20/2012   Dr Dion Saucier   TOTAL HIP ARTHROPLASTY Left 11/20/2012   Procedure: TOTAL HIP ARTHROPLASTY;  Surgeon: Eulas Post, MD;  Location: [facility] OR;  Service:  Orthopedics;  Laterality: Left;    Social History   Tobacco Use   Smoking status: Never   Smokeless tobacco: Never  Substance Use Topics   Alcohol use: No    Alcohol/week: 0.0 standard drinks of alcohol   Drug use: Never    Family History  Problem Relation Age of Onset   Cancer Mother        Breast   Stroke Mother    Diabetes Mother    Diabetes Father    Hearing loss Father    Stroke Maternal Grandmother    Stroke Maternal Aunt    Stroke Maternal Uncle    Diabetes Sister    Hypertension Sister    Diabetes Brother    Hearing loss Brother    Heart disease Brother    Hypertension Brother    Intellectual disability Daughter    Learning disabilities Daughter    Obesity Daughter    Varicose Veins Daughter    Miscarriages / [place] Daughter    Miscarriages / [place] Daughter     Allergies  Allergen Reactions   Sulfa Antibiotics Rash and Other (See Comments)    Reaction not confirmed, but a possibility (from childhood)    Medication list has been reviewed and updated.  Current Outpatient Medications on File Prior to Visit  Medication Sig Dispense Refill   acetic acid-hydrocortisone (VOSOL-HC) OTIC solution Place 3 drops into both ears 2 (two) times daily. Use as needed for ear itching 10 mL 3   Alcohol Swabs PADS Use to test up to 4 times daily 100 each 2   ALEVE 220 MG tablet Take 220-440 mg by mouth 2 (two) times daily as needed (for headaches).     allopurinol (ZYLOPRIM) 300 MG tablet Take 150 mg by mouth daily.     blood glucose meter kit and supplies KIT Pt needs One Touch Ultra glucometer.  Use up to BID to check blood sugars 1 each 0   clopidogrel (PLAVIX) 75 MG tablet Take 1 tablet (75 mg total) by mouth daily. 90 tablet 3   diclofenac sodium (VOLTAREN) 1 % GEL Apply 2-4 g topically 2 (two) times daily as needed (for pain).     doxycycline (VIBRAMYCIN) 100 MG capsule Take 1 capsule (100 mg total) by mouth daily. Use for malaria prophylaxis- take one  daily during exposure and for 4 weeks after exposure 60 capsule 0   FARXIGA 10 MG TABS tablet TAKE 1 TABLET(10 MG) BY MOUTH DAILY BEFORE BREAKFAST 90 tablet 1   glucose blood (ONETOUCH ULTRA) test strip USE TO TEST UP TO FOUR TIMES DAILY 200 strip 12   hydrochlorothiazide (MICROZIDE) 12.5 MG capsule Take 1 capsule (12.5 mg total) by mouth daily. 90 capsule 3   Insulin Pen Needle (PEN NEEDLES) 32G X 5 MM MISC 1 each by Does not apply route daily. Use for basal insulin injection 100 each 6   LANTUS SOLOSTAR 100  UNIT/ML Solostar Pen ADMINISTER 30 UNITS UNDER THE SKIN DAILY 15 mL 5   losartan (COZAAR) 100 MG tablet Take 1 tablet (100 mg total) by mouth daily. 90 tablet 3   metFORMIN (GLUCOPHAGE-XR) 500 MG 24 hr tablet Take 2 tablets (1,000 mg total) by mouth 2 (two) times daily with a meal. 360 tablet 3   nebivolol (BYSTOLIC) 10 MG tablet Take 1 tablet (10 mg total) by mouth in the morning and at bedtime. 180 tablet 3   rOPINIRole (REQUIP) 0.25 MG tablet TAKE 2 TABLETS(0.5 MG) BY MOUTH AT BEDTIME AS NEEDED 180 tablet 0   rosuvastatin (CRESTOR) 5 MG tablet TAKE 1 TABLET BY MOUTH EVERY MONDAY, WEDNESDAY AND FRIDAY 39 tablet 2   Semaglutide, 2 MG/DOSE, (OZEMPIC, 2 MG/DOSE,) 8 MG/3ML SOPN Inject 2 mg into the skin once a week. 9 mL 3   SM LANCETS 33G MISC USE TO TEST BLOOD SUGAR TWICE DAILY 400 each PRN   tadalafil (CIALIS) 5 MG tablet Take 0.5 tablets (2.5 mg total) by mouth daily as needed for erectile dysfunction (for urinary dx). (Patient taking differently: Take 2.5 mg by mouth daily as needed (for urinary dx). BPH) 45 tablet 3   typhoid (VIVOTIF) DR capsule Take 1 capsule by mouth every other day. 4 capsule 0   zolpidem (AMBIEN) 5 MG tablet Take 1 tablet (5 mg total) by mouth at bedtime as needed for sleep. 30 tablet 1   No current facility-administered medications on file prior to visit.    Review of Systems:  As per HPI- otherwise negative.   Physical Examination: There were no vitals filed  for this visit. There were no vitals filed for this visit. There is no height or weight on file to calculate BMI. Ideal Body Weight:    GEN: no acute distress. HEENT: Atraumatic, Normocephalic.  Ears and Nose: No external deformity. CV: RRR, No M/G/R. No JVD. No thrill. No extra heart sounds. PULM: CTA B, no wheezes, crackles, rhonchi. No retractions. No resp. distress. No accessory muscle use. ABD: S, NT, ND, +BS. No rebound. No HSM. EXTR: No c/c/e PSYCH: Normally interactive. Conversant.    Assessment and Plan: ***  Signed Abbe Amsterdam, MD

## 2023-05-22 ENCOUNTER — Ambulatory Visit (INDEPENDENT_AMBULATORY_CARE_PROVIDER_SITE_OTHER): Payer: Medicare Other | Admitting: Family Medicine

## 2023-05-22 ENCOUNTER — Encounter: Payer: Self-pay | Admitting: Family Medicine

## 2023-05-22 VITALS — BP 145/90 | HR 68 | Ht 72.0 in | Wt 223.6 lb

## 2023-05-22 DIAGNOSIS — E119 Type 2 diabetes mellitus without complications: Secondary | ICD-10-CM

## 2023-05-22 DIAGNOSIS — G2581 Restless legs syndrome: Secondary | ICD-10-CM | POA: Diagnosis not present

## 2023-05-22 DIAGNOSIS — E1140 Type 2 diabetes mellitus with diabetic neuropathy, unspecified: Secondary | ICD-10-CM | POA: Diagnosis not present

## 2023-05-22 DIAGNOSIS — Z7985 Long-term (current) use of injectable non-insulin antidiabetic drugs: Secondary | ICD-10-CM | POA: Diagnosis not present

## 2023-05-22 DIAGNOSIS — I10 Essential (primary) hypertension: Secondary | ICD-10-CM

## 2023-05-22 MED ORDER — DAPAGLIFLOZIN PROPANEDIOL 10 MG PO TABS: 10.0000 mg | ORAL_TABLET | Freq: Every day | ORAL | 3 refills | Status: DC

## 2023-05-22 MED ORDER — GABAPENTIN 300 MG PO CAPS
300.0000 mg | ORAL_CAPSULE | Freq: Every day | ORAL | 3 refills | Status: DC
Start: 2023-05-22 — End: 2023-07-19

## 2023-05-23 ENCOUNTER — Encounter: Payer: Self-pay | Admitting: Family Medicine

## 2023-05-23 LAB — COMPREHENSIVE METABOLIC PANEL
ALT: 21 U/L (ref 0–53)
AST: 17 U/L (ref 0–37)
Albumin: 4.4 g/dL (ref 3.5–5.2)
Alkaline Phosphatase: 45 U/L (ref 39–117)
BUN: 20 mg/dL (ref 6–23)
CO2: 26 meq/L (ref 19–32)
Calcium: 10 mg/dL (ref 8.4–10.5)
Chloride: 101 meq/L (ref 96–112)
Creatinine, Ser: 1 mg/dL (ref 0.40–1.50)
GFR: 74.22 mL/min (ref >60.00)
Glucose, Bld: 171 mg/dL — ABNORMAL HIGH (ref 70–99)
Potassium: 3.7 meq/L (ref 3.5–5.1)
Sodium: 137 meq/L (ref 135–145)
Total Bilirubin: 0.5 mg/dL (ref 0.2–1.2)
Total Protein: 6.8 g/dL (ref 6.0–8.3)

## 2023-05-23 LAB — CBC
HCT: 41.5 % (ref 39.0–52.0)
Hemoglobin: 14 g/dL (ref 13.0–17.0)
MCHC: 33.7 g/dL (ref 30.0–36.0)
MCV: 92.8 fL (ref 78.0–100.0)
Platelets: 269 10*3/uL (ref 150.0–400.0)
RBC: 4.48 Mil/uL (ref 4.22–5.81)
RDW: 12.8 % (ref 11.5–15.5)
WBC: 6.4 10*3/uL (ref 4.0–10.5)

## 2023-05-23 LAB — LIPID PANEL
Cholesterol: 119 mg/dL (ref 0–200)
HDL: 48.8 mg/dL (ref >39.00)
LDL Cholesterol: 24 mg/dL (ref 0–99)
NonHDL: 70.35
Total CHOL/HDL Ratio: 2
Triglycerides: 233 mg/dL — ABNORMAL HIGH (ref 0.0–149.0)
VLDL: 46.6 mg/dL — ABNORMAL HIGH (ref 0.0–40.0)

## 2023-05-23 LAB — HEMOGLOBIN A1C: Hgb A1c MFr Bld: 7.9 % — ABNORMAL HIGH (ref 4.6–6.5)

## 2023-05-23 LAB — MICROALBUMIN / CREATININE URINE RATIO
Creatinine,U: 50.5 mg/dL
Microalb Creat Ratio: 1.4 mg/g (ref 0.0–30.0)
Microalb, Ur: <0.7 mg/dL (ref 0.0–1.9)

## 2023-06-04 DIAGNOSIS — G4733 Obstructive sleep apnea (adult) (pediatric): Secondary | ICD-10-CM | POA: Diagnosis not present

## 2023-06-12 ENCOUNTER — Other Ambulatory Visit: Payer: Self-pay | Admitting: Family Medicine

## 2023-06-12 DIAGNOSIS — G2581 Restless legs syndrome: Secondary | ICD-10-CM

## 2023-06-28 ENCOUNTER — Other Ambulatory Visit: Payer: Self-pay | Admitting: Gastroenterology

## 2023-06-28 ENCOUNTER — Telehealth: Payer: Self-pay | Admitting: Internal Medicine

## 2023-06-28 DIAGNOSIS — R0689 Other abnormalities of breathing: Secondary | ICD-10-CM | POA: Diagnosis not present

## 2023-06-28 DIAGNOSIS — Z1211 Encounter for screening for malignant neoplasm of colon: Secondary | ICD-10-CM | POA: Diagnosis not present

## 2023-06-28 DIAGNOSIS — K5730 Diverticulosis of large intestine without perforation or abscess without bleeding: Secondary | ICD-10-CM | POA: Diagnosis not present

## 2023-06-28 NOTE — Telephone Encounter (Signed)
   Name: Phillip [name]  DOB: 06-Sep-1950  MRN: [id number]  Primary Cardiologist: None  Chart reviewed as part of pre-operative protocol coverage. Because of [place] Dowdy's past medical history and time since last visit, he will require a follow-up in-office visit in order to better assess preoperative cardiovascular risk.  He has not been seen since 2023.  Previously followed by Dr. Graciela Husbands for management of loop recorder, this was removed in 2023.  Pre-op covering staff: - Please schedule appointment and call patient to inform them. If patient already had an upcoming appointment within acceptable timeframe, please add "pre-op clearance" to the appointment notes so provider is aware. - Please contact requesting surgeon's office via preferred method (i.e, phone, fax) to inform them of need for appointment prior to surgery.  Patient takes Plavix for history of CVA.  This is not managed by cardiology.  Recommendations for holding Plavix prior to procedure should come from managing provider (PCP/neurology).  Joylene Grapes, NP  06/28/2023, 10:57 AM

## 2023-06-28 NOTE — Telephone Encounter (Signed)
 Attempted to contact patient to schedule pre-op clearance app with [facility]/EP APP - no answer, [email or web address] & MyChart message sent as well.

## 2023-06-28 NOTE — Telephone Encounter (Signed)
 I will send message to EP schedulers to see notes pt needs appt in office for preop clearance. Pt can see EP APP if no appt with Dr. Graciela Husbands.

## 2023-06-28 NOTE — Telephone Encounter (Signed)
 I will update the surgeon office as the where we are currently with the preop clearance process.

## 2023-06-28 NOTE — Telephone Encounter (Signed)
 Patient returned my call regarding getting an appointment scheduled for pre-op clearance for his 4/18 procedure. He states he doesn't understand why Dr. Graciela Husbands can't mark this as done since he seen Dr. Graciela Husbands in December. Advised patient his last office visit with Dr. Graciela Husbands was 04/2022 and for pre-op clearance, the last visit needs to be within the year. He is disputing this stating that it was 2024 when he was last seen. Advised the patient that is not showing in our records. He seems to be looking through his records to confirm it was the end of 2024 when he was last seen. He states he will find the date that he came last year. Appointment was not made.

## 2023-06-28 NOTE — Telephone Encounter (Signed)
   Pre-operative Risk Assessment    Patient Name: Phillip [name]  DOB: [birth year] MRN: [id number]   Date of last office visit: 04/29/2022   Date of next office visit: none   Request for Surgical Clearance    Procedure:   colonoscopy  Date of Surgery:  Clearance 08/18/23                                Surgeon:  Dr. Iva Lento Group or Practice Name:  [facility] Phone number:  [phone] Fax number:  [phone]   Type of Clearance Requested:   - Medical  - Pharmacy:  Hold Clopidogrel (Plavix) not mentioned on clearance request   Type of Anesthesia:   Propofol   Additional requests/questions:    Sharen Hones   06/28/2023, 9:44 AM

## 2023-06-29 ENCOUNTER — Encounter: Payer: Self-pay | Admitting: Family Medicine

## 2023-06-29 NOTE — Telephone Encounter (Signed)
 Patient is scheduled on 3/13 with Francis Dowse for yearly follow up + pre-op clearance.

## 2023-07-13 ENCOUNTER — Ambulatory Visit: Payer: Medicare Other | Admitting: Physician Assistant

## 2023-07-16 NOTE — Patient Instructions (Incomplete)
 Always great to see you Recommend dose of RSV if not done already I will be in touch with your labs and we will get you set up for a CT of your abd/pelvis Let me know if you don't hear about this in the next day or so

## 2023-07-16 NOTE — Progress Notes (Unsigned)
 [facility] [address], [address] [place], [place] [address] [phone]  Date:  07/19/2023   Name:  Phillip [name]   DOB:  [birth year]   MRN:  [id number]  PCP:  Pearline Cables, MD    Chief Complaint: No chief complaint on file.   History of Present Illness:  Phillip [name] is a 73 y.o. 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 left lower quadrant pain, he wonders if he may have diverticulitis He does have a colonoscopy coming up soon Most recent visit with myself was in January  History of diabetes, sleep apnea on CPAP, diabetic neuropathy, CAD, migraine headache, ischemic stroke in 2014 with no residual   Lab Results  Component Value Date   HGBA1C 7.9 (H) 05/22/2023     Patient Active Problem List   Diagnosis Date Noted   RLS (restless legs syndrome) 04/11/2023   Atrial tachycardia (HCC) 04/22/2022   Palpitations 02/18/2022   Severe obstructive sleep apnea-hypopnea syndrome 07/01/2020   Dependence on CPAP ventilation 07/01/2020   Type 2 diabetes mellitus with diabetic neuropathy, without long-term current use of insulin (HCC) 09/02/2019   OSA and COPD overlap syndrome (HCC) 09/02/2019   Abnormal hemoglobin (HCC) 09/02/2019   OSA on CPAP 08/12/2015   Ventricular tachycardia (HCC) 11/10/2014   CAD (coronary artery disease), native coronary artery    Migraine variant 08/14/2013   History of ischemic stroke without residual deficits 04/17/2013   ED (erectile dysfunction) 02/01/2012   Gout 09/05/2011   Hyperlipidemia 09/05/2011   Hypertriglyceridemia 09/05/2011   PVC (premature ventricular contraction) 09/05/2011   Essential hypertension, benign 06/01/2011    Past Medical History:  Diagnosis Date   Allergy 1955   Sulfa   Anemia    Arthritis    Diabetes mellitus    Gout    Hyperlipidemia    Hypertension    Localized osteoarthritis of left knee  11/20/2012   Migraine headache with aura    Neuromuscular disorder (HCC)    " mild periferal neauropathy   Osteoarthritis of left hip 11/20/2012   PVC (premature ventricular contraction)    Sleep apnea    cpap   Stroke (HCC) 04/2013    Past Surgical History:  Procedure Laterality Date   CHOLECYSTECTOMY  05/03/1995   CVA     CVA, occiptal   CVA     CVA, occipital   JOINT REPLACEMENT  2014   knee arthoscopy  05/02/2004   Left Knee   LEFT HEART CATH AND CORONARY ANGIOGRAPHY N/A 12/07/2017   Procedure: LEFT HEART CATH AND CORONARY ANGIOGRAPHY;  Surgeon: Lennette Bihari, MD;  Location: [facility] INVASIVE CV LAB;  Service: Cardiovascular;  Laterality: N/A;   left total hip     left unicarpmental knee     LOOP RECORDER IMPLANT N/A 04/19/2013   Procedure: LOOP RECORDER IMPLANT;  Surgeon: Marinus Maw, MD;  Location: [facility] CATH LAB;  Service: Cardiovascular;  Laterality: N/A;   PARTIAL KNEE ARTHROPLASTY Left 11/20/2012   Procedure: UNICOMPARTMENTAL KNEE;  Surgeon: Eulas Post, MD;  Location: [facility] OR;  Service: Orthopedics;  Laterality: Left;   REPLACEMENT UNICONDYLAR JOINT KNEE Left 11/20/2012   Dr Dion Saucier   TEE WITHOUT CARDIOVERSION N/A 04/19/2013   Procedure: TRANSESOPHAGEAL ECHOCARDIOGRAM (TEE);  Surgeon: Laurey Morale, MD;  Location: [facility] ENDOSCOPY;  Service: Cardiovascular;  Laterality: N/A;   TONSILLECTOMY AND ADENOIDECTOMY  Childhood   TOTAL HIP ARTHROPLASTY Left 11/20/2012   Dr Dion Saucier   TOTAL HIP ARTHROPLASTY Left 11/20/2012   Procedure: TOTAL HIP ARTHROPLASTY;  Surgeon: Eulas Post, MD;  Location: [facility] OR;  Service: Orthopedics;  Laterality: Left;    Social History   Tobacco Use   Smoking status: Never   Smokeless tobacco: Never  Substance Use Topics   Alcohol use: No    Alcohol/week: 0.0 standard drinks of alcohol   Drug use: Never    Family History  Problem Relation Age of Onset   Cancer Mother        Breast   Stroke Mother    Diabetes Mother    Diabetes  Father    Hearing loss Father    Stroke Maternal Grandmother    Stroke Maternal Aunt    Stroke Maternal Uncle    Diabetes Sister    Hypertension Sister    Diabetes Brother    Hearing loss Brother    Heart disease Brother    Hypertension Brother    Intellectual disability Daughter    Learning disabilities Daughter    Obesity Daughter    Varicose Veins Daughter    Miscarriages / [place] Daughter    Miscarriages / [place] Daughter     Allergies  Allergen Reactions   Sulfa Antibiotics Rash and Other (See Comments)    Reaction not confirmed, but a possibility (from childhood)    Medication list has been reviewed and updated.  Current Outpatient Medications on File Prior to Visit  Medication Sig Dispense Refill   acetic acid-hydrocortisone (VOSOL-HC) OTIC solution Place 3 drops into both ears 2 (two) times daily. Use as needed for ear itching 10 mL 3   Alcohol Swabs PADS Use to test up to 4 times daily 100 each 2   ALEVE 220 MG tablet Take 220-440 mg by mouth 2 (two) times daily as needed (for headaches).     allopurinol (ZYLOPRIM) 300 MG tablet Take 150 mg by mouth daily.     blood glucose meter kit and supplies KIT Pt needs One Touch Ultra glucometer.  Use up to BID to check blood sugars 1 each 0   clopidogrel (PLAVIX) 75 MG tablet Take 1 tablet (75 mg total) by mouth daily. 90 tablet 3   dapagliflozin propanediol (FARXIGA) 10 MG TABS tablet Take 1 tablet (10 mg total) by mouth daily. 90 tablet 3   diclofenac sodium (VOLTAREN) 1 % GEL Apply 2-4 g topically 2 (two) times daily as needed (for pain).     doxycycline (VIBRAMYCIN) 100 MG capsule Take 1 capsule (100 mg total) by mouth daily. Use for malaria prophylaxis- take one daily during exposure and for 4 weeks after exposure 60 capsule 0   gabapentin (NEURONTIN) 300 MG capsule Take 1 capsule (300 mg total) by mouth at bedtime. 90 capsule 3   glucose blood (ONETOUCH ULTRA) test strip USE TO TEST UP TO FOUR TIMES DAILY 200  strip 12   hydrochlorothiazide (MICROZIDE) 12.5 MG capsule Take 1 capsule (12.5 mg total) by mouth daily. 90 capsule 3   Insulin Pen Needle (PEN NEEDLES) 32G X 5 MM MISC 1 each by Does not apply route daily. Use for basal insulin injection 100 each 6   LANTUS SOLOSTAR 100 UNIT/ML Solostar Pen ADMINISTER 30 UNITS UNDER THE SKIN DAILY 15 mL 5   losartan (COZAAR) 100 MG tablet Take 1 tablet (100 mg total) by mouth daily. 90 tablet 3   metFORMIN (GLUCOPHAGE-XR) 500 MG 24 hr tablet  Take 2 tablets (1,000 mg total) by mouth 2 (two) times daily with a meal. 360 tablet 3   nebivolol (BYSTOLIC) 10 MG tablet Take 1 tablet (10 mg total) by mouth in the morning and at bedtime. 180 tablet 3   rOPINIRole (REQUIP) 0.25 MG tablet TAKE 2 TABLETS(0.5 MG) BY MOUTH AT BEDTIME AS NEEDED 180 tablet 0   rosuvastatin (CRESTOR) 5 MG tablet TAKE 1 TABLET BY MOUTH EVERY MONDAY, WEDNESDAY AND FRIDAY 39 tablet 2   Semaglutide, 2 MG/DOSE, (OZEMPIC, 2 MG/DOSE,) 8 MG/3ML SOPN Inject 2 mg into the skin once a week. 9 mL 3   SM LANCETS 33G MISC USE TO TEST BLOOD SUGAR TWICE DAILY 400 each PRN   tadalafil (CIALIS) 5 MG tablet Take 0.5 tablets (2.5 mg total) by mouth daily as needed for erectile dysfunction (for urinary dx). (Patient taking differently: Take 2.5 mg by mouth daily as needed (for urinary dx). BPH) 45 tablet 3   typhoid (VIVOTIF) DR capsule Take 1 capsule by mouth every other day. 4 capsule 0   zolpidem (AMBIEN) 5 MG tablet Take 1 tablet (5 mg total) by mouth at bedtime as needed for sleep. 30 tablet 1   No current facility-administered medications on file prior to visit.    Review of Systems:  As per HPI- otherwise negative.   Physical Examination: There were no vitals filed for this visit. There were no vitals filed for this visit. There is no height or weight on file to calculate BMI. Ideal Body Weight:    GEN: no acute distress. HEENT: Atraumatic, Normocephalic.  Ears and Nose: No external  deformity. CV: RRR, No M/G/R. No JVD. No thrill. No extra heart sounds. PULM: CTA B, no wheezes, crackles, rhonchi. No retractions. No resp. distress. No accessory muscle use. ABD: S, NT, ND, +BS. No rebound. No HSM. EXTR: No c/c/e PSYCH: Normally interactive. Conversant.  Can update foot exam  Assessment and Plan: ***  Signed Abbe Amsterdam, MD

## 2023-07-17 NOTE — Progress Notes (Unsigned)
 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   Since last being seen in our clinic the patient reports doing well overall. He continues to walk 2-4 miles 4x per week. He carried a chair up stairs for his wife yesterday without difficulty.  He notes his physical limitations are due orthopedic issues and never due to dyspnea or chest pain.  He is only occasionally aware of PVC's > mostly at night when he is lying on his left side. He is compliant with CPAP.    He denies chest pain, palpitations, dyspnea, PND, orthopnea, nausea, vomiting, dizziness, syncope, edema, weight gain, or early satiety.   Review of systems complete and found to be negative unless listed in HPI.   EP Information / Studies Reviewed:    EKG is ordered today. Personal review as below.  EKG Interpretation Date/Time:  Tuesday July 18 2023 15:01:58 EDT Ventricular Rate:  67 PR Interval:  228 QRS Duration:  94 QT Interval:  384 QTC Calculation: 405 R Axis:   -31  Text Interpretation: Sinus rhythm with 1st degree A-V block Left axis deviation Incomplete right bundle branch block Confirmed by Phillip [name] ([id number]) on 07/18/2023 3:11:36 PM   Studies:  ECHO 10/2012 > LVEF 60-655 [facility] 11/2017 > non-obs CAD  CaScore 07/2020 > 75    Risk Assessment/Calculations:              Physical Exam:   VS:  BP 124/80   Pulse 69   Ht 6' (1.829 m)   Wt 223 lb 12.8 oz (101.5 kg)   SpO2 97%   BMI 30.35 kg/m    Wt Readings from Last 3 Encounters:  07/18/23 223 lb 12.8 oz (101.5 kg)  05/22/23 223 lb 9.6 oz (101.4 kg)  04/11/23 225 lb 3.2 oz (102.2  kg)     GEN: Well nourished, well developed in no acute distress NECK: No JVD; No carotid bruits CARDIAC: Regular rate and rhythm, no murmurs, rubs, gallops RESPIRATORY:  Clear to auscultation without rales, wheezing or rhonchi  ABDOMEN: Soft, non-tender, non-distended EXTREMITIES:  No edema; No deformity   ASSESSMENT AND PLAN:    Palpitations with Atrial / Ventricular Ectopy  NSVT  AT  Hx of LINQ, explanted 04/29/2022.  -asymptomatic in regards to PVC's / palpitations -continue bystolic 10 mg BID  -EKG with NSR    Hypertension  -well controlled on current regimen    Coronary Artery Calcifications  Non-Obs CAD by [facility]  -no anginal symptoms   OSA  -CPAP compliance encouraged   DM  -per primary  LLQ Abd Pain  Per pt he is pending colonoscopy but has noted left lower quadrant / lateral pain at night, has concerns about diverticulitis and if he should move forward with his colonoscopy.   -Defer timing of Colonoscopy to GI, +/- further imaging     Pre-Operative Cardiac Clearance  Procedure:  colonoscopy Date of Surgery: Clearance 08/18/23                              Surgeon:  Dr. Elnoria [name] Surgeon's Group or Practice Name:  [facility] Phone number:  [phone] Fax number:  [phone] Type of Clearance Requested:   - Medical  - Pharmacy:  Hold Clopidogrel (Plavix) not mentioned on clearance request > prescribed by Neurology, defer hold parameters to Neurology if GI has not already given patient instructions.    Type of Anesthesia:   Propofol      [name]'s perioperative risk of a major cardiac event is 6.6% according to the Revised Cardiac Risk Index (RCRI- insulin use and prior CVA).  Therefore, he is at high risk for perioperative complications.   His functional capacity is excellent at 9.89 METs according to the Duke Activity Status Index (DASI).  Recommendations: According to ACC/AHA guidelines, no further cardiovascular testing needed.  The patient may  proceed to surgery at acceptable risk.    Antiplatelet and/or Anticoagulation Recommendations: Defer antiplatelet hold to Neurology as prescribed for TIA.    Follow up with Dr. Graciela [name]  in July/August 2025   Signed, Phillip Brim, NP-C, AGACNP-BC  [facility] - Elect[facility]iology  07/18/2023, 3:31 PM

## 2023-07-18 ENCOUNTER — Encounter: Payer: Self-pay | Admitting: Pulmonary Disease

## 2023-07-18 ENCOUNTER — Ambulatory Visit: Attending: Pulmonary Disease | Admitting: Pulmonary Disease

## 2023-07-18 VITALS — BP 124/80 | HR 69 | Ht 72.0 in | Wt 223.8 lb

## 2023-07-18 DIAGNOSIS — Z01818 Encounter for other preprocedural examination: Secondary | ICD-10-CM

## 2023-07-18 DIAGNOSIS — I4729 Other ventricular tachycardia: Secondary | ICD-10-CM | POA: Diagnosis not present

## 2023-07-18 DIAGNOSIS — I4719 Other supraventricular tachycardia: Secondary | ICD-10-CM | POA: Diagnosis not present

## 2023-07-18 DIAGNOSIS — R002 Palpitations: Secondary | ICD-10-CM | POA: Diagnosis not present

## 2023-07-18 NOTE — Patient Instructions (Signed)
 Medication Instructions:  Your physician recommends that you continue on your current medications as directed. Please refer to the Current Medication list given to you today.  *If you need a refill on your cardiac medications before your next appointment, please call your pharmacy*  Lab Work: None ordered If you have labs (blood work) drawn today and your tests are completely normal, you will receive your results only by: MyChart Message (if you have MyChart) OR A paper copy in the mail If you have any lab test that is abnormal or we need to change your treatment, we will call you to review the results.  Follow-Up: At [facility], you and your health needs are our priority.  As part of our continuing mission to provide you with exceptional heart care, we have created designated Provider Care Teams.  These Care Teams include your primary Cardiologist (physician) and Advanced Practice Providers (APPs -  Physician Assistants and Nurse Practitioners) who all work together to provide you with the care you need, when you need it.  Your next appointment:   July/August 2025  Provider:   Sherryl Manges, MD only

## 2023-07-19 ENCOUNTER — Encounter: Payer: Self-pay | Admitting: Family Medicine

## 2023-07-19 ENCOUNTER — Other Ambulatory Visit: Payer: Self-pay | Admitting: Family Medicine

## 2023-07-19 ENCOUNTER — Ambulatory Visit (INDEPENDENT_AMBULATORY_CARE_PROVIDER_SITE_OTHER): Admitting: Family Medicine

## 2023-07-19 VITALS — BP 142/82 | HR 70 | Temp 98.2°F | Resp 18 | Ht 72.0 in | Wt 227.4 lb

## 2023-07-19 DIAGNOSIS — E1140 Type 2 diabetes mellitus with diabetic neuropathy, unspecified: Secondary | ICD-10-CM

## 2023-07-19 DIAGNOSIS — R1032 Left lower quadrant pain: Secondary | ICD-10-CM | POA: Diagnosis not present

## 2023-07-19 LAB — CBC
HCT: 39.9 % (ref 39.0–52.0)
Hemoglobin: 13.5 g/dL (ref 13.0–17.0)
MCHC: 33.7 g/dL (ref 30.0–36.0)
MCV: 92.6 fl (ref 78.0–100.0)
Platelets: 253 10*3/uL (ref 150.0–400.0)
RBC: 4.31 Mil/uL (ref 4.22–5.81)
RDW: 12.4 % (ref 11.5–15.5)
WBC: 4.4 10*3/uL (ref 4.0–10.5)

## 2023-07-19 LAB — BASIC METABOLIC PANEL
BUN: 21 mg/dL (ref 6–23)
CO2: 28 meq/L (ref 19–32)
Calcium: 9.8 mg/dL (ref 8.4–10.5)
Chloride: 103 meq/L (ref 96–112)
Creatinine, Ser: 0.98 mg/dL (ref 0.40–1.50)
GFR: 75.96 mL/min (ref >60.00)
Glucose, Bld: 182 mg/dL — ABNORMAL HIGH (ref 70–99)
Potassium: 4.1 meq/L (ref 3.5–5.1)
Sodium: 139 meq/L (ref 135–145)

## 2023-07-19 LAB — SEDIMENTATION RATE: Sed Rate: 11 mm/h (ref 0–20)

## 2023-07-19 LAB — LIPASE: Lipase: 75 U/L — ABNORMAL HIGH (ref 11.0–59.0)

## 2023-07-19 LAB — C-REACTIVE PROTEIN: CRP: <1 mg/dL (ref 0.5–20.0)

## 2023-07-19 MED ORDER — GABAPENTIN 300 MG PO CAPS
300.0000 mg | ORAL_CAPSULE | Freq: Two times a day (BID) | ORAL | 1 refills | Status: DC
Start: 2023-07-19 — End: 2023-12-25

## 2023-07-19 MED ORDER — GABAPENTIN 300 MG PO CAPS
300.0000 mg | ORAL_CAPSULE | Freq: Every day | ORAL | 1 refills | Status: DC
Start: 2023-07-19 — End: 2023-07-19

## 2023-07-19 MED ORDER — GABAPENTIN 300 MG PO CAPS
300.0000 mg | ORAL_CAPSULE | Freq: Two times a day (BID) | ORAL | 1 refills | Status: DC
Start: 2023-07-19 — End: 2023-07-19

## 2023-07-20 ENCOUNTER — Encounter (HOSPITAL_BASED_OUTPATIENT_CLINIC_OR_DEPARTMENT_OTHER): Payer: Self-pay

## 2023-07-20 ENCOUNTER — Ambulatory Visit (HOSPITAL_BASED_OUTPATIENT_CLINIC_OR_DEPARTMENT_OTHER)
Admission: RE | Admit: 2023-07-20 | Discharge: 2023-07-20 | Disposition: A | Discharge status: Home / Self Care | Source: Ambulatory Visit | Attending: Family Medicine | Admitting: Family Medicine

## 2023-07-20 ENCOUNTER — Encounter: Payer: Self-pay | Admitting: Family Medicine

## 2023-07-20 DIAGNOSIS — G4733 Obstructive sleep apnea (adult) (pediatric): Secondary | ICD-10-CM | POA: Diagnosis not present

## 2023-07-20 DIAGNOSIS — K7689 Other specified diseases of liver: Secondary | ICD-10-CM | POA: Diagnosis not present

## 2023-07-20 DIAGNOSIS — R1032 Left lower quadrant pain: Secondary | ICD-10-CM | POA: Diagnosis not present

## 2023-07-20 DIAGNOSIS — N400 Enlarged prostate without lower urinary tract symptoms: Secondary | ICD-10-CM | POA: Diagnosis not present

## 2023-07-20 MED ORDER — IOHEXOL 300 MG/ML  SOLN
100.0000 mL | Freq: Once | INTRAMUSCULAR | Status: AC | PRN
  Administered 2023-07-20: 100 mL via INTRAVENOUS

## 2023-07-27 DIAGNOSIS — E119 Type 2 diabetes mellitus without complications: Secondary | ICD-10-CM | POA: Diagnosis not present

## 2023-07-27 LAB — HM DIABETES EYE EXAM

## 2023-08-06 ENCOUNTER — Other Ambulatory Visit: Payer: Self-pay | Admitting: Family Medicine

## 2023-08-06 DIAGNOSIS — G4700 Insomnia, unspecified: Secondary | ICD-10-CM

## 2023-08-11 ENCOUNTER — Encounter (HOSPITAL_COMMUNITY): Payer: Self-pay | Admitting: Gastroenterology

## 2023-08-11 NOTE — Progress Notes (Signed)
 Attempted to obtain medical history for pre op call via telephone, unable to reach at this time. HIPAA compliant voicemail message left requesting return call to pre surgical testing department.

## 2023-08-15 ENCOUNTER — Other Ambulatory Visit: Payer: Self-pay

## 2023-08-15 ENCOUNTER — Encounter (HOSPITAL_COMMUNITY): Payer: Self-pay | Admitting: Gastroenterology

## 2023-08-15 NOTE — Progress Notes (Signed)
 PCP - Bobbette Burns, MD LOV 07-19-23 epic Cardiologist - Albertine Hugh, MD  Creighton Doffing, NP clearance 07-18-23 epic   PPM/ICD -  Device Orders -  Rep Notified -   Chest x-ray -  EKG - 07-18-23 epic Stress Test -  ECHO -  Cardiac Cath -  Coronary  CT-08-04-20 epic  Sleep Study -  CPAP - yes  Fasting Blood Sugar -  Checks Blood Sugar _____ times a day  Blood Thinner Instructions: Plavix  hold 5 days per pt. Aspirin Instructions:  Ozempic-last dose 08-10-23 Farxiga -Last dose 08-15-23 Lantus take 50% of usual dose night before surgery  Pt. Has bowel prep instructions  Activity--Able to climb a flight of stairs without Cp or SOB Anesthesia review: HTN, Stroke no residual, OSA  Patient denies shortness of breath, fever, cough and chest pain at PAT appointment   All instructions explained to the patient, with a verbal understanding of the material. Patient agrees to go over the instructions while at home for a better understanding. Patient also instructed to self quarantine after being tested for COVID-19. The opportunity to ask questions was provided.

## 2023-08-16 NOTE — Anesthesia Preprocedure Evaluation (Addendum)
 Anesthesia Evaluation  Patient identified by MRN, date of birth, ID band Patient awake    Reviewed: Allergy & Precautions, NPO status , Patient's Chart, lab work & pertinent test results, reviewed documented beta blocker date and time   History of Anesthesia Complications Negative for: history of anesthetic complications  Airway Mallampati: II  TM Distance: >3 FB Neck ROM: Full    Dental no notable dental hx.    Pulmonary sleep apnea and Continuous Positive Airway Pressure Ventilation , COPD   Pulmonary exam normal        Cardiovascular hypertension, Pt. on medications and Pt. on home beta blockers + CAD  Normal cardiovascular exam     Neuro/Psych  Headaches CVA (2014), No Residual Symptoms    GI/Hepatic negative GI ROS, Neg liver ROS,,,  Endo/Other  diabetes (on semaglutide ), Type 2, Oral Hypoglycemic Agents    Renal/GU negative Renal ROS     Musculoskeletal  (+) Arthritis ,    Abdominal   Peds  Hematology negative hematology ROS (+)   Anesthesia Other Findings Day of surgery medications reviewed with patient.  Reproductive/Obstetrics                              Anesthesia Physical Anesthesia Plan  ASA: 2  Anesthesia Plan: MAC   Post-op Pain Management: Minimal or no pain anticipated   Induction:   PONV Risk Score and Plan: 1 and Treatment may vary due to age or medical condition and Propofol  infusion  Airway Management Planned: Natural Airway and Simple Face Mask  Additional Equipment: None  Intra-op Plan:   Post-operative Plan:   Informed Consent: I have reviewed the patients History and Physical, chart, labs and discussed the procedure including the risks, benefits and alternatives for the proposed anesthesia with the patient or authorized representative who has indicated his/her understanding and acceptance.       Plan Discussed with: CRNA  Anesthesia Plan  Comments:         Anesthesia Quick Evaluation

## 2023-08-18 ENCOUNTER — Ambulatory Visit (HOSPITAL_COMMUNITY)
Admission: RE | Admit: 2023-08-18 | Discharge: 2023-08-18 | Disposition: A | Discharge status: Home / Self Care | Payer: Medicare Other | Attending: Gastroenterology | Admitting: Gastroenterology

## 2023-08-18 ENCOUNTER — Ambulatory Visit (HOSPITAL_COMMUNITY): Admitting: Anesthesiology

## 2023-08-18 ENCOUNTER — Other Ambulatory Visit: Payer: Self-pay

## 2023-08-18 ENCOUNTER — Encounter (HOSPITAL_COMMUNITY): Payer: Self-pay | Admitting: Gastroenterology

## 2023-08-18 ENCOUNTER — Encounter (HOSPITAL_COMMUNITY)
Admission: RE | Disposition: A | Discharge status: Home / Self Care | Payer: Self-pay | Source: Home / Self Care | Attending: Gastroenterology

## 2023-08-18 DIAGNOSIS — I10 Essential (primary) hypertension: Secondary | ICD-10-CM | POA: Insufficient documentation

## 2023-08-18 DIAGNOSIS — D123 Benign neoplasm of transverse colon: Secondary | ICD-10-CM | POA: Diagnosis not present

## 2023-08-18 DIAGNOSIS — Z794 Long term (current) use of insulin: Secondary | ICD-10-CM | POA: Insufficient documentation

## 2023-08-18 DIAGNOSIS — Z79899 Other long term (current) drug therapy: Secondary | ICD-10-CM | POA: Diagnosis not present

## 2023-08-18 DIAGNOSIS — E119 Type 2 diabetes mellitus without complications: Secondary | ICD-10-CM | POA: Diagnosis not present

## 2023-08-18 DIAGNOSIS — Z1211 Encounter for screening for malignant neoplasm of colon: Secondary | ICD-10-CM | POA: Insufficient documentation

## 2023-08-18 DIAGNOSIS — K5730 Diverticulosis of large intestine without perforation or abscess without bleeding: Secondary | ICD-10-CM

## 2023-08-18 DIAGNOSIS — Z7901 Long term (current) use of anticoagulants: Secondary | ICD-10-CM | POA: Insufficient documentation

## 2023-08-18 DIAGNOSIS — Z139 Encounter for screening, unspecified: Secondary | ICD-10-CM | POA: Diagnosis not present

## 2023-08-18 DIAGNOSIS — I2510 Atherosclerotic heart disease of native coronary artery without angina pectoris: Secondary | ICD-10-CM | POA: Diagnosis not present

## 2023-08-18 DIAGNOSIS — D122 Benign neoplasm of ascending colon: Secondary | ICD-10-CM | POA: Insufficient documentation

## 2023-08-18 DIAGNOSIS — Z7985 Long-term (current) use of injectable non-insulin antidiabetic drugs: Secondary | ICD-10-CM | POA: Diagnosis not present

## 2023-08-18 DIAGNOSIS — D124 Benign neoplasm of descending colon: Secondary | ICD-10-CM | POA: Insufficient documentation

## 2023-08-18 DIAGNOSIS — J449 Chronic obstructive pulmonary disease, unspecified: Secondary | ICD-10-CM | POA: Diagnosis not present

## 2023-08-18 DIAGNOSIS — Z8673 Personal history of transient ischemic attack (TIA), and cerebral infarction without residual deficits: Secondary | ICD-10-CM | POA: Insufficient documentation

## 2023-08-18 DIAGNOSIS — Z860100 Personal history of colon polyps, unspecified: Secondary | ICD-10-CM | POA: Diagnosis not present

## 2023-08-18 DIAGNOSIS — G4730 Sleep apnea, unspecified: Secondary | ICD-10-CM | POA: Insufficient documentation

## 2023-08-18 DIAGNOSIS — K635 Polyp of colon: Secondary | ICD-10-CM | POA: Diagnosis not present

## 2023-08-18 HISTORY — PX: COLONOSCOPY WITH PROPOFOL: SHX5780

## 2023-08-18 HISTORY — PX: POLYPECTOMY: SHX149

## 2023-08-18 HISTORY — DX: Pneumonia, unspecified organism: J18.9

## 2023-08-18 LAB — GLUCOSE, CAPILLARY: Glucose-Capillary: 131 mg/dL — ABNORMAL HIGH (ref 70–99)

## 2023-08-18 SURGERY — COLONOSCOPY WITH PROPOFOL
Anesthesia: Monitor Anesthesia Care

## 2023-08-18 MED ORDER — PROPOFOL 10 MG/ML IV BOLUS
INTRAVENOUS | Status: DC | PRN
  Administered 2023-08-18: 20 mg via INTRAVENOUS

## 2023-08-18 MED ORDER — PROPOFOL 500 MG/50ML IV EMUL
INTRAVENOUS | Status: DC | PRN
  Administered 2023-08-18: 100 ug/kg/min via INTRAVENOUS

## 2023-08-18 MED ORDER — SODIUM CHLORIDE 0.9 % IV SOLN
INTRAVENOUS | Status: AC | PRN
  Administered 2023-08-18: 500 mL via INTRAMUSCULAR

## 2023-08-18 MED ORDER — PROPOFOL 10 MG/ML IV BOLUS
INTRAVENOUS | Status: AC
  Filled 2023-08-18: qty 20

## 2023-08-18 MED ORDER — SODIUM CHLORIDE 0.9 % IV SOLN: INTRAVENOUS | Status: DC | PRN

## 2023-08-18 MED ORDER — PROPOFOL 500 MG/50ML IV EMUL
INTRAVENOUS | Status: AC
  Filled 2023-08-18: qty 50

## 2023-08-18 SURGICAL SUPPLY — 20 items

## 2023-08-18 NOTE — H&P (Signed)
 Arloa Lamas HPI: His last colonoscopy in 2017 only showed diverticula.  No adenomas were detected, however, in 2014 he had adenomas.  A 1.5 cm hepatic adenoma was removed in 2014.  Over the past several months he reports intermittent left lower back pain and LLQ discomfort.  There is a history of pandiverticulosis.  Dr. Geralyn Knee last year ordered a Cologuard and it was negative.  The original recommendation from 2017 was to have a follow up colonoscopy in one year.  He does not reports any issues with hematochezia, melena, diarrhea, chest pain, SOB, or MI.  His follow up with Dr. Rodolfo Clan, cardiology, last Fall did not yield any concerns.  He takes Plavix  for a history of an occipital CVA, but he does not have any residuals  Past Medical History:  Diagnosis Date   Allergy 1955   Sulfa   Anemia    pt. denies   Arthritis    Diabetes mellitus    type 2   Gout    Hyperlipidemia    Hypertension    Localized osteoarthritis of left knee 11/20/2012   Migraine headache with aura    rarely   Neuromuscular disorder (HCC)    " mild periferal neauropathy   Osteoarthritis of left hip 11/20/2012   Pneumonia    PVC (premature ventricular contraction)    Sleep apnea    cpap   Stroke (HCC) 04/2013   no residuals    Past Surgical History:  Procedure Laterality Date   CHOLECYSTECTOMY  05/03/1995   CVA     CVA, occiptal   CVA     CVA, occipital   knee arthoscopy  05/02/2004   Left Knee   LEFT HEART CATH AND CORONARY ANGIOGRAPHY N/A 12/07/2017   Procedure: LEFT HEART CATH AND CORONARY ANGIOGRAPHY;  Surgeon: Millicent Ally, MD;  Location: [facility] INVASIVE CV LAB;  Service: Cardiovascular;  Laterality: N/A;   LOOP RECORDER IMPLANT N/A 04/19/2013   Procedure: LOOP RECORDER IMPLANT;  Surgeon: Tammie Fall, MD;  Location: [facility] CATH LAB;  Service: Cardiovascular;  Laterality: N/A;   LOOP RECORDER REMOVAL     PARTIAL KNEE ARTHROPLASTY Left 11/20/2012   Procedure: UNICOMPARTMENTAL KNEE;  Surgeon: Neville Barbone, MD;  Location: [facility] OR;  Service: Orthopedics;  Laterality: Left;   REPLACEMENT UNICONDYLAR JOINT KNEE Left 11/20/2012   Dr Agatha Horsfall   TEE WITHOUT CARDIOVERSION N/A 04/19/2013   Procedure: TRANSESOPHAGEAL ECHOCARDIOGRAM (TEE);  Surgeon: Darlis Eisenmenger, MD;  Location: [facility] ENDOSCOPY;  Service: Cardiovascular;  Laterality: N/A;   TONSILLECTOMY AND ADENOIDECTOMY     Childhood   TOTAL HIP ARTHROPLASTY Left 11/20/2012   Procedure: TOTAL HIP ARTHROPLASTY;  Surgeon: Neville Barbone, MD;  Location: [facility] OR;  Service: Orthopedics;  Laterality: Left;   WISDOM TOOTH EXTRACTION      Family History  Problem Relation Age of Onset   Cancer Mother        Breast   Stroke Mother    Diabetes Mother    Diabetes Father    Hearing loss Father    Stroke Maternal Grandmother    Stroke Maternal Aunt    Stroke Maternal Uncle    Diabetes Sister    Hypertension Sister    Diabetes Brother    Hearing loss Brother    Heart disease Brother    Hypertension Brother    Intellectual disability Daughter    Learning disabilities Daughter    Obesity Daughter    Varicose Veins Daughter    Miscarriages / [place]  Daughter    Miscarriages / [place] Daughter     Social History:  reports that he has never smoked. He has never used smokeless tobacco. He reports that he does not drink alcohol  and does not use drugs.  Allergies:  Allergies  Allergen Reactions   Sulfa Antibiotics Rash and Other (See Comments)    Reaction not confirmed, but a possibility (from childhood)    Medications: Scheduled: Continuous:  sodium chloride  500 mL (08/18/23 0701)    Results for orders placed or performed during the hospital encounter of 08/18/23 (from the past 24 hours)  Glucose, capillary     Status: Abnormal   Collection Time: 08/18/23  7:00 AM  Result Value Ref Range   Glucose-Capillary 131 (H) 70 - 99 mg/dL     No results found.  ROS:  As stated above in the HPI otherwise negative.  Blood pressure (!)  180/95, pulse 68, temperature 98 F (36.7 C), temperature source Tympanic, resp. rate 12, height 6' (1.829 m), weight 103.1 kg, SpO2 97%.    PE: Gen: NAD, Alert and Oriented HEENT:  [place]/AT, EOMI Neck: Supple, no LAD Lungs: CTA Bilaterally CV: RRR without M/G/R ABD: Soft, NTND, +BS Ext: No C/C/E  Assessment/Plan: 1) Personal history of polyp - colonoscopy.  Taelor Moncada D 08/18/2023, 7:22 AM

## 2023-08-18 NOTE — Op Note (Signed)
 [facility] Patient Name: Phillip [name] Procedure Date: 08/18/2023 MRN: [id number] Attending MD: Alvis Jourdain , MD, [id number] Date of Birth: [birth year] CSN: [id number] Age: 73 Admit Type: Outpatient Procedure:                Colonoscopy Indications:              High risk colon cancer surveillance: Personal                            history of colonic polyps Providers:                Alvis Jourdain, MD, Lonzell Robin, RN, Rinda Cheers, Technician Referring MD:              Medicines:                Propofol  per Anesthesia Complications:            No immediate complications. Estimated Blood Loss:     Estimated blood loss: none. Procedure:                Pre-Anesthesia Assessment:                           - Prior to the procedure, a History and Physical                            was performed, and patient medications and                            allergies were reviewed. The patient's tolerance of                            previous anesthesia was also reviewed. The risks                            and benefits of the procedure and the sedation                            options and risks were discussed with the patient.                            All questions were answered, and informed consent                            was obtained. Prior Anticoagulants: The patient has                            taken Plavix  (clopidogrel ), last dose was 5 days                            prior to procedure. ASA Grade Assessment: III - A  patient with severe systemic disease. After                            reviewing the risks and benefits, the patient was                            deemed in satisfactory condition to undergo the                            procedure.                           - Sedation was administered by an anesthesia                            professional. Deep sedation was attained.                            After obtaining informed consent, the colonoscope                            was passed under direct vision. Throughout the                            procedure, the patient's blood pressure, pulse, and                            oxygen saturations were monitored continuously. The                            CF-HQ190L ([id number]) Olympus colonoscope was                            introduced through the anus and advanced to the the                            cecum, identified by appendiceal orifice and                            ileocecal valve. The colonoscopy was performed                            without difficulty. The patient tolerated the                            procedure well. The quality of the bowel                            preparation was evaluated using the BBPS [facility]                            Bowel Preparation Scale) with scores of: Right                            Colon =  3 (entire mucosa seen well with no residual                            staining, small fragments of stool or opaque                            liquid), Transverse Colon = 2 (minor amount of                            residual staining, small fragments of stool and/or                            opaque liquid, but mucosa seen well) and Left Colon                            = 2 (minor amount of residual staining, small                            fragments of stool and/or opaque liquid, but mucosa                            seen well). The total BBPS score equals 7. The                            quality of the bowel preparation was good. The                            ileocecal valve, appendiceal orifice, and rectum                            were photographed. Scope In: 7:38:27 AM Scope Out: 8:03:13 AM Scope Withdrawal Time: 0 hours 16 minutes 53 seconds  Total Procedure Duration: 0 hours 24 minutes 46 seconds  Findings:      Five sessile polyps were found in the descending colon, transverse colon        and ascending colon. The polyps were 3 to 4 mm in size. These polyps       were removed with a cold snare. Resection and retrieval were complete.      Scattered large-mouthed and medium-mouthed diverticula were found in the       entire colon. Impression:               - Five 3 to 4 mm polyps in the descending colon, in                            the transverse colon and in the ascending colon,                            removed with a cold snare. Resected and retrieved.                           - Diverticulosis in the entire examined colon. Moderate Sedation:      Not Applicable - Patient had care per Anesthesia.  Recommendation:           - Patient has a contact number available for                            emergencies. The signs and symptoms of potential                            delayed complications were discussed with the                            patient. Return to normal activities tomorrow.                            Written discharge instructions were provided to the                            patient.                           - Resume previous diet.                           - Continue present medications.                           - Await pathology results.                           - Repeat colonoscopy in 3 years for surveillance.                           - Restart Plavix . Procedure Code(s):        --- Professional ---                           [phone], Colonoscopy, flexible; with removal of                            tumor(s), polyp(s), or other lesion(s) by snare                            technique Diagnosis Code(s):        --- Professional ---                           Z86.010, Personal history of colonic polyps                           D12.4, Benign neoplasm of descending colon                           D12.3, Benign neoplasm of transverse colon (hepatic                            flexure or splenic flexure)  D12.2, Benign neoplasm  of ascending colon                           K57.30, Diverticulosis of large intestine without                            perforation or abscess without bleeding CPT copyright 2022 American Medical Association. All rights reserved. The codes documented in this report are preliminary and upon coder review may  be revised to meet current compliance requirements. Alvis Jourdain, MD Alvis Jourdain, MD 08/18/2023 8:12:52 AM This report has been signed electronically. Number of Addenda: 0

## 2023-08-18 NOTE — Transfer of Care (Signed)
 Immediate Anesthesia Transfer of Care Note  Patient: Phillip [name]  Procedure(s) Performed: COLONOSCOPY WITH PROPOFOL  POLYPECTOMY, INTESTINE  Patient Location: PACU  Anesthesia Type:MAC  Level of Consciousness: awake and drowsy  Airway & Oxygen Therapy: Patient Spontanous Breathing and Patient connected to face mask oxygen  Post-op Assessment: Report given to RN and Post -op Vital signs reviewed and stable  Post vital signs: Reviewed and stable  Last Vitals:  Vitals Value Taken Time  BP    Temp    Pulse    Resp    SpO2      Last Pain:  Vitals:   08/18/23 0639  TempSrc: Tympanic  PainSc: 0-No pain         Complications: No notable events documented.

## 2023-08-18 NOTE — Anesthesia Postprocedure Evaluation (Signed)
 Anesthesia Post Note  Patient: Phillip [name]  Procedure(s) Performed: COLONOSCOPY WITH PROPOFOL  POLYPECTOMY, INTESTINE     Patient location during evaluation: PACU Anesthesia Type: MAC Level of consciousness: awake and alert Pain management: pain level controlled Vital Signs Assessment: post-procedure vital signs reviewed and stable Respiratory status: spontaneous breathing, nonlabored ventilation and respiratory function stable Cardiovascular status: blood pressure returned to baseline Postop Assessment: no apparent nausea or vomiting Anesthetic complications: no   No notable events documented.  Last Vitals:  Vitals:   08/18/23 0820 08/18/23 0830  BP: 127/84 (!) 130/93  Pulse: 76 72  Resp: 15 12  Temp:    SpO2: 97% 95%    Last Pain:  Vitals:   08/18/23 0830  TempSrc:   PainSc: 0-No pain                 Vertell Row

## 2023-08-18 NOTE — Discharge Instructions (Signed)
 YOU HAD AN ENDOSCOPIC PROCEDURE TODAY: Refer to the procedure report and other information in the discharge instructions given to you for any specific questions about what was found during the examination. If this information does not answer your questions

## 2023-08-21 ENCOUNTER — Encounter (HOSPITAL_COMMUNITY): Payer: Self-pay | Admitting: Gastroenterology

## 2023-08-21 LAB — SURGICAL PATHOLOGY

## 2023-08-26 DIAGNOSIS — G4733 Obstructive sleep apnea (adult) (pediatric): Secondary | ICD-10-CM | POA: Diagnosis not present

## 2023-08-28 DIAGNOSIS — T8484XD Pain due to internal orthopedic prosthetic devices, implants and grafts, subsequent encounter: Secondary | ICD-10-CM | POA: Diagnosis not present

## 2023-09-10 ENCOUNTER — Other Ambulatory Visit: Payer: Self-pay | Admitting: Family Medicine

## 2023-09-10 DIAGNOSIS — G2581 Restless legs syndrome: Secondary | ICD-10-CM

## 2023-09-19 NOTE — Progress Notes (Addendum)
 [facility] [address], [address] [place], [place] [address] [phone]  Date:  09/20/2023   Name:  Phillip [name]   DOB:  1951/04/18   MRN:  [id number]  PCP:  Kaylee Partridge, MD    Chief Complaint: Medical Management of Chronic Issues   History of Present Illness:  Phillip [name] is a 73 y.o. very pleasant male patient who presents with the following:  Patient seen today for periodic follow-up of diabetes.  Most recent visit with myself was in March at which time he was concerned about a persistent left lower quadrant pain-present off and on for about 6 months History of diabetes, sleep apnea on CPAP, diabetic neuropathy, CAD, migraine headache, ischemic stroke in 2014 with no residual   To address the left lower quadrant pain we got a CT scan which was negative, he also had a colonoscopy about 1 month ago per Dr. Lu Rump removed several polyps noted diverticulosis I have not received the pathology report yet- all ok per pt report   Lab Results  Component Value Date   HGBA1C 7.9 (H) 05/22/2023   Current diabetes treatment: Farxiga  10, metformin  1000 twice daily, Lantus , Ozempic  2 mg Also Crestor , tadalafil , Bystolic , losartan , ropinirole , HCTZ, gabapentin , Plavix   Noted mild increase of lipase during our previous workup, can repeat this today-discussed with patient, given recent normal abdominal CT and lack of symptoms he would prefer not to repeat a lipase  About a month ago he was pulling a heavy bucket in the yard and felt pain in his left hip which is an artifical joint.  He could walk but it continue to hurt- he saw his ortho Dr Agatha Horsfall- all ok on films Still hurting some now although getting better- he is not walking as much recently due to the pain  He notes he tends to be quite sensitive to carbs-  He will see urology - Dr Inga Manges in a few weeks.  Wonders if we can go ahead and draw his PSA today  He already  did his RSV;  Never did get Shingrix although he had Zostavax  BP Readings from Last 3 Encounters:  09/20/23 136/80  08/18/23 (!) 143/91  07/19/23 (!) 142/82   Wt Readings from Last 3 Encounters:  09/20/23 226 lb (102.5 kg)  08/18/23 227 lb 4.7 oz (103.1 kg)  07/19/23 227 lb 6.4 oz (103.1 kg)     Patient Active Problem List   Diagnosis Date Noted   RLS (restless legs syndrome) 04/11/2023   Atrial tachycardia (HCC) 04/22/2022   Palpitations 02/18/2022   Severe obstructive sleep apnea-hypopnea syndrome 07/01/2020   Dependence on CPAP ventilation 07/01/2020   Type 2 diabetes mellitus with diabetic neuropathy, without long-term current use of insulin  (HCC) 09/02/2019   OSA and COPD overlap syndrome (HCC) 09/02/2019   Abnormal hemoglobin (HCC) 09/02/2019   OSA on CPAP 08/12/2015   Ventricular tachycardia (HCC) 11/10/2014   CAD (coronary artery disease), native coronary artery    Migraine variant 08/14/2013   History of ischemic stroke without residual deficits 04/17/2013   ED (erectile dysfunction) 02/01/2012   Gout 09/05/2011   Hyperlipidemia 09/05/2011   Hypertriglyceridemia 09/05/2011   PVC (premature ventricular contraction) 09/05/2011   Essential hypertension, benign 06/01/2011    Past Medical History:  Diagnosis Date   Allergy 1955   Sulfa   Anemia    pt. denies   Arthritis    Diabetes mellitus  type 2   Gout    Hyperlipidemia    Hypertension    Localized osteoarthritis of left knee 11/20/2012   Migraine headache with aura    rarely   Neuromuscular disorder (HCC)    " mild periferal neauropathy   Osteoarthritis of left hip 11/20/2012   Pneumonia    PVC (premature ventricular contraction)    Sleep apnea    cpap   Stroke (HCC) 04/2013   no residuals    Past Surgical History:  Procedure Laterality Date   CHOLECYSTECTOMY  05/03/1995   COLONOSCOPY WITH PROPOFOL  N/A 08/18/2023   Procedure: COLONOSCOPY WITH PROPOFOL ;  Surgeon: Alvis Jourdain, MD;   Location: [facility] ENDOSCOPY;  Service: Gastroenterology;  Laterality: N/A;   CVA     CVA, occiptal   CVA     CVA, occipital   knee arthoscopy  05/02/2004   Left Knee   LEFT HEART CATH AND CORONARY ANGIOGRAPHY N/A 12/07/2017   Procedure: LEFT HEART CATH AND CORONARY ANGIOGRAPHY;  Surgeon: Millicent Ally, MD;  Location: [facility] INVASIVE CV LAB;  Service: Cardiovascular;  Laterality: N/A;   LOOP RECORDER IMPLANT N/A 04/19/2013   Procedure: LOOP RECORDER IMPLANT;  Surgeon: Tammie Fall, MD;  Location: [facility] CATH LAB;  Service: Cardiovascular;  Laterality: N/A;   LOOP RECORDER REMOVAL     PARTIAL KNEE ARTHROPLASTY Left 11/20/2012   Procedure: UNICOMPARTMENTAL KNEE;  Surgeon: Neville Barbone, MD;  Location: [facility] OR;  Service: Orthopedics;  Laterality: Left;   POLYPECTOMY  08/18/2023   Procedure: POLYPECTOMY, INTESTINE;  Surgeon: Alvis Jourdain, MD;  Location: [facility] ENDOS[facility];  Service: Gastroenterology;;   REPLACEMENT UNICONDYLAR JOINT KNEE Left 11/20/2012   Dr Agatha Horsfall   TEE WITHOUT CARDIOVERSION N/A 04/19/2013   Procedure: TRANSESOPHAGEAL ECHOCARDIOGRAM (TEE);  Surgeon: Darlis Eisenmenger, MD;  Location: [facility] ENDOSCOPY;  Service: Cardiovascular;  Laterality: N/A;   TONSILLECTOMY AND ADENOIDECTOMY     Childhood   TOTAL HIP ARTHROPLASTY Left 11/20/2012   Procedure: TOTAL HIP ARTHROPLASTY;  Surgeon: Neville Barbone, MD;  Location: [facility] OR;  Service: Orthopedics;  Laterality: Left;   WISDOM TOOTH EXTRACTION      Social History   Tobacco Use   Smoking status: Never   Smokeless tobacco: Never  Vaping Use   Vaping status: Never Used  Substance Use Topics   Alcohol  use: No    Alcohol /week: 0.0 standard drinks of alcohol    Drug use: Never    Family History  Problem Relation Age of Onset   Cancer Mother        Breast   Stroke Mother    Diabetes Mother    Diabetes Father    Hearing loss Father    Stroke Maternal Grandmother    Stroke Maternal Aunt    Stroke Maternal Uncle    Diabetes Sister     Hypertension Sister    Diabetes Brother    Hearing loss Brother    Heart disease Brother    Hypertension Brother    Intellectual disability Daughter    Learning disabilities Daughter    Obesity Daughter    Varicose Veins Daughter    Miscarriages / [place] Daughter    Miscarriages / [place] Daughter     Allergies  Allergen Reactions   Sulfa Antibiotics Rash and Other (See Comments)    Reaction not confirmed, but a possibility (from childhood)    Medication list has been reviewed and updated.  Current Outpatient Medications on File Prior to Visit  Medication Sig Dispense Refill   acetic acid -hydrocortisone  (  VOSOL -HC) OTIC solution Place 3 drops into both ears 2 (two) times daily. Use as needed for ear itching 10 mL 3   Alcohol  Swabs  PADS Use to test up to 4 times daily 100 each 2   ALEVE 220 MG tablet Take 220-440 mg by mouth 2 (two) times daily as needed (for headaches).     allopurinol  (ZYLOPRIM ) 300 MG tablet Take 150 mg by mouth daily.     blood glucose meter kit and supplies KIT Pt needs One Touch Ultra glucometer.  Use up to BID to check blood sugars 1 each 0   clopidogrel  (PLAVIX ) 75 MG tablet Take 1 tablet (75 mg total) by mouth daily. 90 tablet 3   dapagliflozin  propanediol (FARXIGA ) 10 MG TABS tablet Take 1 tablet (10 mg total) by mouth daily. 90 tablet 3   diclofenac sodium (VOLTAREN) 1 % GEL Apply 2-4 g topically 2 (two) times daily as needed (for pain).     gabapentin  (NEURONTIN ) 300 MG capsule Take 1 capsule (300 mg total) by mouth 2 (two) times daily. 180 capsule 1   glucose blood (ONETOUCH ULTRA) test strip USE TO TEST UP TO FOUR TIMES DAILY 200 strip 12   hydrochlorothiazide  (MICROZIDE ) 12.5 MG capsule Take 1 capsule (12.5 mg total) by mouth daily. 90 capsule 3   Insulin  Pen Needle (PEN NEEDLES) 32G X 5 MM MISC 1 each by Does not apply route daily. Use for basal insulin  injection 100 each 6   LANTUS  SOLOSTAR 100 UNIT/ML Solostar Pen ADMINISTER 30 UNITS UNDER  THE SKIN DAILY 15 mL 5   losartan  (COZAAR ) 100 MG tablet Take 1 tablet (100 mg total) by mouth daily. 90 tablet 3   metFORMIN  (GLUCOPHAGE -XR) 500 MG 24 hr tablet Take 2 tablets (1,000 mg total) by mouth 2 (two) times daily with a meal. 360 tablet 3   nebivolol  (BYSTOLIC ) 10 MG tablet Take 1 tablet (10 mg total) by mouth in the morning and at bedtime. 180 tablet 3   rOPINIRole  (REQUIP ) 0.25 MG tablet TAKE 2 TABLETS(0.5 MG) BY MOUTH AT BEDTIME AS NEEDED (Patient taking differently: Take 0.25 mg by mouth at bedtime.) 180 tablet 0   rosuvastatin  (CRESTOR ) 5 MG tablet TAKE 1 TABLET BY MOUTH EVERY MONDAY, WEDNESDAY AND FRIDAY 39 tablet 2   Semaglutide , 2 MG/DOSE, (OZEMPIC , 2 MG/DOSE,) 8 MG/3ML SOPN Inject 2 mg into the skin once a week. 9 mL 3   SM LANCETS 33G MISC USE TO TEST BLOOD SUGAR TWICE DAILY 400 each PRN   tadalafil  (CIALIS ) 5 MG tablet Take 0.5 tablets (2.5 mg total) by mouth daily as needed for erectile dysfunction (for urinary dx). (Patient taking differently: Take 2.5 mg by mouth daily as needed (for urinary dx). BPH) 45 tablet 3   zolpidem  (AMBIEN ) 5 MG tablet TAKE 1 TABLET(5 MG) BY MOUTH AT BEDTIME AS NEEDED FOR SLEEP 30 tablet 1   No current facility-administered medications on file prior to visit.    Review of Systems:  As per HPI- otherwise negative. He just got hearing aids   Physical Examination: Vitals:   09/20/23 1539  BP: 136/80  Pulse: 68  SpO2: 96%   Vitals:   09/20/23 1539  Weight: 226 lb (102.5 kg)  Height: 6' (1.829 m)   Body mass index is 30.65 kg/m. Ideal Body Weight: Weight in (lb) to have BMI = 25: 183.9  GEN: no acute distress. Overweight, looks well  HEENT: Atraumatic, Normocephalic.  Ears and Nose: No external deformity. CV: RRR, No M/G/R.  No JVD. No thrill. No extra heart sounds. PULM: CTA B, no wheezes, crackles, rhonchi. No retractions. No resp. distress. No accessory muscle use. EXTR: No c/c/e PSYCH: Normally interactive. Conversant.     Assessment and Plan: Type 2 diabetes mellitus with diabetic neuropathy, without long-term current use of insulin  (HCC) - Plan: Hemoglobin A1c  Essential hypertension, benign  Screening for prostate cancer - Plan: PSA  Patient seen today for follow-up of diabetes.  His A1c has been reasonable although a bit higher than ideal.  We discussed possibly switching him from Ozempic  to Mounjaro; we will look at his A1c and then decide We also discussed using some short acting insulin  with evening meal which is when his glucose tends to spike.  However he would prefer to avoid adding insulin  if possible  Blood pressure under good control  Will check PSA today  Will plan further follow- up pending labs.  Signed Gates Kasal, MD  Received labs as below, 5/22.  Message to patient  Results for orders placed or performed in visit on 09/20/23  Hemoglobin A1c   Collection Time: 09/20/23  4:10 PM  Result Value Ref Range   Hgb A1c MFr Bld 7.8 (H) 4.6 - 6.5 %  PSA   Collection Time: 09/20/23  4:10 PM  Result Value Ref Range   PSA 2.83 0.10 - 4.00 ng/mL

## 2023-09-20 ENCOUNTER — Ambulatory Visit (INDEPENDENT_AMBULATORY_CARE_PROVIDER_SITE_OTHER): Payer: Medicare Other | Admitting: Family Medicine

## 2023-09-20 VITALS — BP 136/80 | HR 68 | Ht 72.0 in | Wt 226.0 lb

## 2023-09-20 DIAGNOSIS — Z7985 Long-term (current) use of injectable non-insulin antidiabetic drugs: Secondary | ICD-10-CM

## 2023-09-20 DIAGNOSIS — I10 Essential (primary) hypertension: Secondary | ICD-10-CM

## 2023-09-20 DIAGNOSIS — E1140 Type 2 diabetes mellitus with diabetic neuropathy, unspecified: Secondary | ICD-10-CM | POA: Diagnosis not present

## 2023-09-20 DIAGNOSIS — Z125 Encounter for screening for malignant neoplasm of prostate: Secondary | ICD-10-CM

## 2023-09-20 DIAGNOSIS — Z7984 Long term (current) use of oral hypoglycemic drugs: Secondary | ICD-10-CM

## 2023-09-20 NOTE — Patient Instructions (Signed)
 Great to see you again today!  I will be in touch with your labs

## 2023-09-21 ENCOUNTER — Encounter: Payer: Self-pay | Admitting: Family Medicine

## 2023-09-21 LAB — PSA: PSA: 2.83 ng/mL (ref 0.10–4.00)

## 2023-09-21 LAB — HEMOGLOBIN A1C: Hgb A1c MFr Bld: 7.8 % — ABNORMAL HIGH (ref 4.6–6.5)

## 2023-09-26 ENCOUNTER — Encounter: Payer: Self-pay | Admitting: Family Medicine

## 2023-09-27 ENCOUNTER — Ambulatory Visit (INDEPENDENT_AMBULATORY_CARE_PROVIDER_SITE_OTHER): Admitting: Family Medicine

## 2023-09-27 ENCOUNTER — Encounter: Payer: Self-pay | Admitting: Family Medicine

## 2023-09-27 ENCOUNTER — Ambulatory Visit (HOSPITAL_BASED_OUTPATIENT_CLINIC_OR_DEPARTMENT_OTHER)
Admission: RE | Admit: 2023-09-27 | Discharge: 2023-09-27 | Disposition: A | Discharge status: Home / Self Care | Source: Ambulatory Visit | Attending: Family Medicine | Admitting: Family Medicine

## 2023-09-27 VITALS — BP 130/70 | HR 82 | Temp 98.5°F | Ht 72.0 in | Wt 223.0 lb

## 2023-09-27 DIAGNOSIS — R051 Acute cough: Secondary | ICD-10-CM

## 2023-09-27 DIAGNOSIS — R509 Fever, unspecified: Secondary | ICD-10-CM | POA: Diagnosis not present

## 2023-09-27 DIAGNOSIS — R059 Cough, unspecified: Secondary | ICD-10-CM | POA: Diagnosis not present

## 2023-09-27 LAB — CBC
HCT: 40.7 % (ref 39.0–52.0)
Hemoglobin: 13.7 g/dL (ref 13.0–17.0)
MCHC: 33.6 g/dL (ref 30.0–36.0)
MCV: 92 fl (ref 78.0–100.0)
Platelets: 245 10*3/uL (ref 150.0–400.0)
RBC: 4.43 Mil/uL (ref 4.22–5.81)
RDW: 13.1 % (ref 11.5–15.5)
WBC: 10.3 10*3/uL (ref 4.0–10.5)

## 2023-09-27 LAB — BASIC METABOLIC PANEL WITH GFR
BUN: 26 mg/dL — ABNORMAL HIGH (ref 6–23)
CO2: 29 meq/L (ref 19–32)
Calcium: 10.1 mg/dL (ref 8.4–10.5)
Chloride: 97 meq/L (ref 96–112)
Creatinine, Ser: 1.16 mg/dL (ref 0.40–1.50)
GFR: 61.96 mL/min (ref >60.00)
Glucose, Bld: 256 mg/dL — ABNORMAL HIGH (ref 70–99)
Potassium: 3.9 meq/L (ref 3.5–5.1)
Sodium: 135 meq/L (ref 135–145)

## 2023-09-27 MED ORDER — BENZONATATE 200 MG PO CAPS: 200.0000 mg | ORAL_CAPSULE | Freq: Two times a day (BID) | ORAL | 0 refills | Status: DC | PRN

## 2023-09-27 NOTE — Progress Notes (Signed)
 [facility] [address], [address] [place], [place] [address] [phone]  Date:  09/27/2023   Name:  Phillip [name]   DOB:  1951-04-06   MRN:  [id number]  PCP:  Kaylee Partridge, MD    Chief Complaint: Cough (Started 5 days ago but worsen in the last 2 days; lots of congestion; flu/covid at home test was NEG yesterday; 101 temp yesterday; body chills)   History of Present Illness:  Phillip [name] is a 73 y.o. very pleasant male patient who presents with the following:  Seen today with acute illness- cough and fever Tested for flu and covid at home- negative  He got sick about 5 days ago-started with cough, mild ST, but did not feel especially bad at first Getting worse the last 2 days Yesterday he had a lot of mucus in his throat, temp to 101 yesterday He tested for covid and flu at home- both negative.  He actually tested for COVID twice He was having chills He tried some albuterol but did not really help his cough No fever reducers yet today-last dose of tylenol  about 9 hours ago  Feeling a touch better this am compared with yesterday No GI symptoms  His wife is not ill  Vaccinated against COVID, flu, RSV  Patient Active Problem List   Diagnosis Date Noted   RLS (restless legs syndrome) 04/11/2023   Atrial tachycardia (HCC) 04/22/2022   Palpitations 02/18/2022   Severe obstructive sleep apnea-hypopnea syndrome 07/01/2020   Dependence on CPAP ventilation 07/01/2020   Type 2 diabetes mellitus with diabetic neuropathy, without long-term current use of insulin  (HCC) 09/02/2019   OSA and COPD overlap syndrome (HCC) 09/02/2019   Abnormal hemoglobin (HCC) 09/02/2019   OSA on CPAP 08/12/2015   Ventricular tachycardia (HCC) 11/10/2014   CAD (coronary artery disease), native coronary artery    Migraine variant 08/14/2013   History of ischemic stroke without residual deficits 04/17/2013   ED (erectile dysfunction)  02/01/2012   Gout 09/05/2011   Hyperlipidemia 09/05/2011   Hypertriglyceridemia 09/05/2011   PVC (premature ventricular contraction) 09/05/2011   Essential hypertension, benign 06/01/2011    Past Medical History:  Diagnosis Date   Allergy 1955   Sulfa   Anemia    pt. denies   Arthritis    Diabetes mellitus    type 2   Gout    Hyperlipidemia    Hypertension    Localized osteoarthritis of left knee 11/20/2012   Migraine headache with aura    rarely   Neuromuscular disorder (HCC)    " mild periferal neauropathy   Osteoarthritis of left hip 11/20/2012   Pneumonia    PVC (premature ventricular contraction)    Sleep apnea    cpap   Stroke (HCC) 04/2013   no residuals    Past Surgical History:  Procedure Laterality Date   CHOLECYSTECTOMY  05/03/1995   COLONOSCOPY WITH PROPOFOL  N/A 08/18/2023   Procedure: COLONOSCOPY WITH PROPOFOL ;  Surgeon: Alvis Jourdain, MD;  Location: [facility] ENDOSCOPY;  Service: Gastroenterology;  Laterality: N/A;   CVA     CVA, occiptal   CVA     CVA, occipital   knee arthoscopy  05/02/2004   Left Knee   LEFT HEART CATH AND CORONARY ANGIOGRAPHY N/A 12/07/2017   Procedure: LEFT HEART CATH AND CORONARY ANGIOGRAPHY;  Surgeon: Millicent Ally, MD;  Location: [facility] INVASIVE CV LAB;  Service: Cardiovascular;  Laterality: N/A;   LOOP RECORDER  IMPLANT N/A 04/19/2013   Procedure: LOOP RECORDER IMPLANT;  Surgeon: Tammie Fall, MD;  Location: [facility] CATH LAB;  Service: Cardiovascular;  Laterality: N/A;   LOOP RECORDER REMOVAL     PARTIAL KNEE ARTHROPLASTY Left 11/20/2012   Procedure: UNICOMPARTMENTAL KNEE;  Surgeon: Neville Barbone, MD;  Location: [facility] OR;  Service: Orthopedics;  Laterality: Left;   POLYPECTOMY  08/18/2023   Procedure: POLYPECTOMY, INTESTINE;  Surgeon: Alvis Jourdain, MD;  Location: [facility] ENDOSCOPY;  Service: Gastroenterology;;   REPLACEMENT UNICONDYLAR JOINT KNEE Left 11/20/2012   Dr Agatha Horsfall   TEE WITHOUT CARDIOVERSION N/A 04/19/2013   Procedure:  TRANSESOPHAGEAL ECHOCARDIOGRAM (TEE);  Surgeon: Darlis Eisenmenger, MD;  Location: [facility] ENDOSCOPY;  Service: Cardiovascular;  Laterality: N/A;   TONSILLECTOMY AND ADENOIDECTOMY     Childhood   TOTAL HIP ARTHROPLASTY Left 11/20/2012   Procedure: TOTAL HIP ARTHROPLASTY;  Surgeon: Neville Barbone, MD;  Location: [facility] OR;  Service: Orthopedics;  Laterality: Left;   WISDOM TOOTH EXTRACTION      Social History   Tobacco Use   Smoking status: Never   Smokeless tobacco: Never  Vaping Use   Vaping status: Never Used  Substance Use Topics   Alcohol  use: No    Alcohol /week: 0.0 standard drinks of alcohol    Drug use: Never    Family History  Problem Relation Age of Onset   Cancer Mother        Breast   Stroke Mother    Diabetes Mother    Diabetes Father    Hearing loss Father    Stroke Maternal Grandmother    Stroke Maternal Aunt    Stroke Maternal Uncle    Diabetes Sister    Hypertension Sister    Diabetes Brother    Hearing loss Brother    Heart disease Brother    Hypertension Brother    Intellectual disability Daughter    Learning disabilities Daughter    Obesity Daughter    Varicose Veins Daughter    Miscarriages / [place] Daughter    Miscarriages / [place] Daughter     Allergies  Allergen Reactions   Sulfa Antibiotics Rash and Other (See Comments)    Reaction not confirmed, but a possibility (from childhood)    Medication list has been reviewed and updated.  Current Outpatient Medications on File Prior to Visit  Medication Sig Dispense Refill   acetic acid -hydrocortisone  (VOSOL -HC) OTIC solution Place 3 drops into both ears 2 (two) times daily. Use as needed for ear itching 10 mL 3   Alcohol  Swabs  PADS Use to test up to 4 times daily 100 each 2   ALEVE 220 MG tablet Take 220-440 mg by mouth 2 (two) times daily as needed (for headaches).     allopurinol  (ZYLOPRIM ) 300 MG tablet Take 150 mg by mouth daily.     blood glucose meter kit and supplies KIT Pt needs One  Touch Ultra glucometer.  Use up to BID to check blood sugars 1 each 0   clopidogrel  (PLAVIX ) 75 MG tablet Take 1 tablet (75 mg total) by mouth daily. 90 tablet 3   dapagliflozin  propanediol (FARXIGA ) 10 MG TABS tablet Take 1 tablet (10 mg total) by mouth daily. 90 tablet 3   diclofenac sodium (VOLTAREN) 1 % GEL Apply 2-4 g topically 2 (two) times daily as needed (for pain).     gabapentin  (NEURONTIN ) 300 MG capsule Take 1 capsule (300 mg total) by mouth 2 (two) times daily. 180 capsule 1   glucose blood (ONETOUCH ULTRA)  test strip USE TO TEST UP TO FOUR TIMES DAILY 200 strip 12   hydrochlorothiazide  (MICROZIDE ) 12.5 MG capsule Take 1 capsule (12.5 mg total) by mouth daily. 90 capsule 3   Insulin  Pen Needle (PEN NEEDLES) 32G X 5 MM MISC 1 each by Does not apply route daily. Use for basal insulin  injection 100 each 6   LANTUS  SOLOSTAR 100 UNIT/ML Solostar Pen ADMINISTER 30 UNITS UNDER THE SKIN DAILY 15 mL 5   losartan  (COZAAR ) 100 MG tablet Take 1 tablet (100 mg total) by mouth daily. 90 tablet 3   metFORMIN  (GLUCOPHAGE -XR) 500 MG 24 hr tablet Take 2 tablets (1,000 mg total) by mouth 2 (two) times daily with a meal. 360 tablet 3   nebivolol  (BYSTOLIC ) 10 MG tablet Take 1 tablet (10 mg total) by mouth in the morning and at bedtime. 180 tablet 3   rOPINIRole  (REQUIP ) 0.25 MG tablet TAKE 2 TABLETS(0.5 MG) BY MOUTH AT BEDTIME AS NEEDED (Patient taking differently: Take 0.25 mg by mouth at bedtime.) 180 tablet 0   rosuvastatin  (CRESTOR ) 5 MG tablet TAKE 1 TABLET BY MOUTH EVERY MONDAY, WEDNESDAY AND FRIDAY 39 tablet 2   Semaglutide , 2 MG/DOSE, (OZEMPIC , 2 MG/DOSE,) 8 MG/3ML SOPN Inject 2 mg into the skin once a week. 9 mL 3   SM LANCETS 33G MISC USE TO TEST BLOOD SUGAR TWICE DAILY 400 each PRN   tadalafil  (CIALIS ) 5 MG tablet Take 0.5 tablets (2.5 mg total) by mouth daily as needed for erectile dysfunction (for urinary dx). (Patient taking differently: Take 2.5 mg by mouth daily as needed (for urinary dx).  BPH) 45 tablet 3   zolpidem  (AMBIEN ) 5 MG tablet TAKE 1 TABLET(5 MG) BY MOUTH AT BEDTIME AS NEEDED FOR SLEEP 30 tablet 1   No current facility-administered medications on file prior to visit.    Review of Systems:  As per HPI- otherwise negative.   Physical Examination: Vitals:   09/27/23 1004  BP: 130/70  Pulse: 82  Temp: 98.5 F (36.9 C)  SpO2: 96%   Vitals:   09/27/23 1004  Weight: 223 lb (101.2 kg)  Height: 6' (1.829 m)   Body mass index is 30.24 kg/m. Ideal Body Weight: Weight in (lb) to have BMI = 25: 183.9  GEN: no acute distress.  Nontoxic but does not appear to feel well HEENT: Atraumatic, Normocephalic.  Bilateral TM wnl, oropharynx normal.  PEERL,EOMI.   Ears and Nose: No external deformity. CV: RRR, No M/G/R. No JVD. No thrill. No extra heart sounds. PULM: CTA B, no wheezes or crackles. No retractions. No resp. distress. No accessory muscle use.  Mild rhonchi in left lower lobe which resolved with cough ABD: S, NT, ND EXTR: No c/c/e PSYCH: Normally interactive. Conversant.   Results for orders placed or performed in visit on 09/27/23  Basic metabolic panel with GFR   Collection Time: 09/27/23 10:35 AM  Result Value Ref Range   Sodium 135 135 - 145 mEq/L   Potassium 3.9 3.5 - 5.1 mEq/L   Chloride 97 96 - 112 mEq/L   CO2 29 19 - 32 mEq/L   Glucose, Bld 256 (H) 70 - 99 mg/dL   BUN 26 (H) 6 - 23 mg/dL   Creatinine, Ser 1.61 0.40 - 1.50 mg/dL   GFR 09.60 >45.40 mL/min   Calcium  10.1 8.4 - 10.5 mg/dL  CBC   Collection Time: 09/27/23 10:35 AM  Result Value Ref Range   WBC 10.3 4.0 - 10.5 K/uL   RBC 4.43  4.22 - 5.81 Mil/uL   Platelets 245.0 150.0 - 400.0 K/uL   Hemoglobin 13.7 13.0 - 17.0 g/dL   HCT 40.9 81.1 - 91.4 %   MCV 92.0 78.0 - 100.0 fl   MCHC 33.6 30.0 - 36.0 g/dL   RDW 78.2 95.6 - 21.3 %    Assessment and Plan: Fever, unspecified - Plan: DG Chest 2 View  Acute cough - Plan: Basic metabolic panel with GFR, CBC Patient seen today with  illness for 5 days, worse the last 2 days.  Negative for COVID and flu.  Cough, body aches, fever.  We decided to obtain labs and a chest x-ray and I will touch base with him when the results come in He would like some Tessalon Perles but declines other cough prescription   Signed Gates Kasal, MD  Called patient around 2 PM to go over results.  Chest x-ray is normal Labs are also reassuring He has doxycycline  at home with her from a previous prescription, will start on 100 mg twice daily for the next 7 to 10 days I asked him to please let me know if he is getting worse or not improving in the next couple of days  DG Chest 2 View Result Date: 09/27/2023 CLINICAL DATA:  Fever and cough. EXAM: CHEST - 2 VIEW COMPARISON:  Chest elongated 02/12/2021. FINDINGS: No focal consolidation, pleural effusion or pneumothorax. The cardiac silhouette is within normal limits. No acute osseous pathology. IMPRESSION: No active cardiopulmonary disease. Electronically Signed   By: Angus Bark M.D.   On: 09/27/2023 12:16

## 2023-09-27 NOTE — Patient Instructions (Signed)
 I will be in touch with your labs and chest film asap

## 2023-09-28 ENCOUNTER — Ambulatory Visit: Admitting: Family Medicine

## 2023-09-29 ENCOUNTER — Other Ambulatory Visit: Payer: Self-pay | Admitting: Family Medicine

## 2023-09-29 DIAGNOSIS — S7002XA Contusion of left hip, initial encounter: Secondary | ICD-10-CM | POA: Diagnosis not present

## 2023-09-29 DIAGNOSIS — E119 Type 2 diabetes mellitus without complications: Secondary | ICD-10-CM

## 2023-09-29 DIAGNOSIS — M898X5 Other specified disorders of bone, thigh: Secondary | ICD-10-CM | POA: Diagnosis not present

## 2023-10-13 ENCOUNTER — Other Ambulatory Visit: Payer: Self-pay | Admitting: Family Medicine

## 2023-10-13 DIAGNOSIS — Z8673 Personal history of transient ischemic attack (TIA), and cerebral infarction without residual deficits: Secondary | ICD-10-CM

## 2023-10-26 NOTE — Progress Notes (Signed)
 [facility] Quality Team Note  Name: Phillip [name] Date of Birth: 1951/04/02 MRN: [id number] Date: 10/26/2023  [facility] Quality Team has reviewed this patient's chart, please see recommendations below:  [facility] Quality Other; (CHART REVIEWED FOR CONTROLLING BLOOD PRESSURE AND KIDNEY HEALTH EVALUATION IN DIABETICS. ABSTRACTED DOCUMENTATION FOR BOTH MEASURES.)

## 2023-10-30 DIAGNOSIS — R351 Nocturia: Secondary | ICD-10-CM | POA: Diagnosis not present

## 2023-10-30 DIAGNOSIS — N401 Enlarged prostate with lower urinary tract symptoms: Secondary | ICD-10-CM | POA: Diagnosis not present

## 2023-10-30 DIAGNOSIS — R3914 Feeling of incomplete bladder emptying: Secondary | ICD-10-CM | POA: Diagnosis not present

## 2023-10-30 DIAGNOSIS — N5201 Erectile dysfunction due to arterial insufficiency: Secondary | ICD-10-CM | POA: Diagnosis not present

## 2023-11-23 DIAGNOSIS — K080 Exfoliation of teeth due to systemic causes: Secondary | ICD-10-CM | POA: Diagnosis not present

## 2023-11-27 DIAGNOSIS — D485 Neoplasm of uncertain behavior of skin: Secondary | ICD-10-CM | POA: Diagnosis not present

## 2023-11-27 DIAGNOSIS — D2272 Melanocytic nevi of left lower limb, including hip: Secondary | ICD-10-CM | POA: Diagnosis not present

## 2023-11-27 DIAGNOSIS — L820 Inflamed seborrheic keratosis: Secondary | ICD-10-CM | POA: Diagnosis not present

## 2023-11-27 DIAGNOSIS — L570 Actinic keratosis: Secondary | ICD-10-CM | POA: Diagnosis not present

## 2023-11-27 DIAGNOSIS — D2261 Melanocytic nevi of right upper limb, including shoulder: Secondary | ICD-10-CM | POA: Diagnosis not present

## 2023-11-27 DIAGNOSIS — L821 Other seborrheic keratosis: Secondary | ICD-10-CM | POA: Diagnosis not present

## 2023-11-30 DIAGNOSIS — G4733 Obstructive sleep apnea (adult) (pediatric): Secondary | ICD-10-CM | POA: Diagnosis not present

## 2023-12-24 ENCOUNTER — Encounter: Payer: Self-pay | Admitting: Family Medicine

## 2023-12-24 DIAGNOSIS — E1140 Type 2 diabetes mellitus with diabetic neuropathy, unspecified: Secondary | ICD-10-CM

## 2023-12-25 MED ORDER — ROSUVASTATIN CALCIUM 5 MG PO TABS: ORAL_TABLET | ORAL | 2 refills | Status: AC

## 2023-12-25 MED ORDER — GABAPENTIN 300 MG PO CAPS: 300.0000 mg | ORAL_CAPSULE | Freq: Three times a day (TID) | ORAL | 1 refills | Status: DC

## 2024-01-02 ENCOUNTER — Encounter: Payer: Self-pay | Admitting: Sports Medicine

## 2024-01-21 NOTE — Progress Notes (Unsigned)
 [facility] [address] [place], [place] [address] [phone]  Date:  01/22/2024   Name:  Phillip [name]   DOB:  [birth year]   MRN:  [id number]  PCP:  Watt Harlene BROCKS, MD    Chief Complaint: No chief complaint on file.   History of Present Illness:  Phillip [name] is a 73 y.o. very pleasant male patient who presents with the following:  Patient seen today for periodic follow-up.  I saw him most recently in May for an acute illness History of diabetes, sleep apnea on CPAP, diabetic neuropathy, CAD, migraine headache, ischemic stroke in 2014 with no residual  He saw his urologist, Dr. Watt in June  Current diabetes treatment: Farxiga  10, metformin  1000 twice daily, Lantus , Ozempic  2 mg Also Crestor , tadalafil , Bystolic , losartan , ropinirole , HCTZ, gabapentin , Plavix   Lab Results  Component Value Date   HGBA1C 7.8 (H) 09/20/2023     Patient Active Problem List   Diagnosis Date Noted   RLS (restless legs syndrome) 04/11/2023   Atrial tachycardia (HCC) 04/22/2022   Palpitations 02/18/2022   Severe obstructive sleep apnea-hypopnea syndrome 07/01/2020   Dependence on CPAP ventilation 07/01/2020   Type 2 diabetes mellitus with diabetic neuropathy, without long-term current use of insulin  (HCC) 09/02/2019   OSA and COPD overlap syndrome (HCC) 09/02/2019   Abnormal hemoglobin (HCC) 09/02/2019   OSA on CPAP 08/12/2015   Ventricular tachycardia (HCC) 11/10/2014   CAD (coronary artery disease), native coronary artery    Migraine variant 08/14/2013   History of ischemic stroke without residual deficits 04/17/2013   ED (erectile dysfunction) 02/01/2012   Gout 09/05/2011   Hyperlipidemia 09/05/2011   Hypertriglyceridemia 09/05/2011   PVC (premature ventricular contraction) 09/05/2011   Essential hypertension, benign 06/01/2011    Past Medical History:  Diagnosis Date   Allergy 1955   Sulfa   Anemia    pt.  denies   Arthritis    Diabetes mellitus    type 2   Gout    Hyperlipidemia    Hypertension    Localized osteoarthritis of left knee 11/20/2012   Migraine headache with aura    rarely   Neuromuscular disorder (HCC)     mild periferal neauropathy   Osteoarthritis of left hip 11/20/2012   Pneumonia    PVC (premature ventricular contraction)    Sleep apnea    cpap   Stroke (HCC) 04/2013   no residuals    Past Surgical History:  Procedure Laterality Date   CHOLECYSTECTOMY  05/03/1995   COLONOSCOPY WITH PROPOFOL  N/A 08/18/2023   Procedure: COLONOSCOPY WITH PROPOFOL ;  Surgeon: Rollin Dover, MD;  Location: [facility];  Service: Gastroenterology;  Laterality: N/A;   CVA     CVA, occiptal   CVA     CVA, occipital   knee arthoscopy  05/02/2004   Left Knee   LEFT HEART CATH AND CORONARY ANGIOGRAPHY N/A 12/07/2017   Procedure: LEFT HEART CATH AND CORONARY ANGIOGRAPHY;  Surgeon: Burnard Debby LABOR, MD;  Location: [facility] INVASIVE CV LAB;  Service: Cardiovascular;  Laterality: N/A;   LOOP RECORDER IMPLANT N/A 04/19/2013   Procedure: LOOP RECORDER IMPLANT;  Surgeon: Danelle LELON Birmingham, MD;  Location: [facility] CATH [facility];  Service: Cardiovascular;  Laterality: N/A;   LOOP RECORDER REMOVAL     PARTIAL KNEE ARTHROPLASTY Left 11/20/2012   Procedure: UNICOMPARTMENTAL KNEE;  Surgeon: Fonda SHAUNNA Olmsted, MD;  Location: [facility] OR;  Service: Orthopedics;  Laterality: Left;  POLYPECTOMY  08/18/2023   Procedure: POLYPECTOMY, INTESTINE;  Surgeon: Rollin Dover, MD;  Location: [facility];  Service: Gastroenterology;;   REPLACEMENT UNICONDYLAR JOINT KNEE Left 11/20/2012   Dr Josefina   TEE WITHOUT CARDIOVERSION N/A 04/19/2013   Procedure: TRANSESOPHAGEAL ECHOCARDIOGRAM (TEE);  Surgeon: Ezra GORMAN Shuck, MD;  Location: [facility] ENDOSCOPY;  Service: Cardiovascular;  Laterality: N/A;   TONSILLECTOMY AND ADENOIDECTOMY     Childhood   TOTAL HIP ARTHROPLASTY Left 11/20/2012   Procedure: TOTAL HIP ARTHROPLASTY;  Surgeon: Fonda SHAUNNA Josefina, MD;  Location: [facility] OR;  Service: Orthopedics;  Laterality: Left;   WISDOM TOOTH EXTRACTION      Social History   Tobacco Use   Smoking status: Never   Smokeless tobacco: Never  Vaping Use   Vaping status: Never Used  Substance Use Topics   Alcohol  use: No    Alcohol /week: 0.0 standard drinks of alcohol    Drug use: Never    Family History  Problem Relation Age of Onset   Cancer Mother        Breast   Stroke Mother    Diabetes Mother    Diabetes Father    Hearing loss Father    Stroke Maternal Grandmother    Stroke Maternal Aunt    Stroke Maternal Uncle    Diabetes Sister    Hypertension Sister    Diabetes Brother    Hearing loss Brother    Heart disease Brother    Hypertension Brother    Intellectual disability Daughter    Learning disabilities Daughter    Obesity Daughter    Varicose Veins Daughter    Miscarriages / [place] Daughter    Miscarriages / [place] Daughter     Allergies  Allergen Reactions   Sulfa Antibiotics Rash and Other (See Comments)    Reaction not confirmed, but a possibility (from childhood)    Medication list has been reviewed and updated.  Current Outpatient Medications on File Prior to Visit  Medication Sig Dispense Refill   acetic acid -hydrocortisone  (VOSOL -HC) OTIC solution Place 3 drops into both ears 2 (two) times daily. Use as needed for ear itching 10 mL 3   Alcohol  Swabs  PADS Use to test up to 4 times daily 100 each 2   ALEVE 220 MG tablet Take 220-440 mg by mouth 2 (two) times daily as needed (for headaches).     allopurinol  (ZYLOPRIM ) 300 MG tablet Take 150 mg by mouth daily.     benzonatate  (TESSALON ) 200 MG capsule Take 1 capsule (200 mg total) by mouth 2 (two) times daily as needed for cough. 60 capsule 0   blood glucose meter kit and supplies KIT Pt needs One Touch Ultra glucometer.  Use up to BID to check blood sugars 1 each 0   clopidogrel  (PLAVIX ) 75 MG tablet Take 1 tablet (75 mg total) by mouth daily. 90  tablet 1   dapagliflozin  propanediol (FARXIGA ) 10 MG TABS tablet Take 1 tablet (10 mg total) by mouth daily. 90 tablet 3   diclofenac sodium (VOLTAREN) 1 % GEL Apply 2-4 g topically 2 (two) times daily as needed (for pain).     gabapentin  (NEURONTIN ) 300 MG capsule Take 1 capsule (300 mg total) by mouth 3 (three) times daily. 270 capsule 1   glucose blood (ONETOUCH ULTRA) test strip USE TO TEST UP TO FOUR TIMES DAILY 200 strip 12   hydrochlorothiazide  (MICROZIDE ) 12.5 MG capsule Take 1 capsule (12.5 mg total) by mouth daily. 90 capsule 3   insulin  glargine (LANTUS   SOLOSTAR) 100 UNIT/ML Solostar Pen Inject 30 Units into the skin daily. 15 mL 3   Insulin  Pen Needle (PEN NEEDLES) 32G X 5 MM MISC 1 each by Does not apply route daily. Use for basal insulin  injection 100 each 6   losartan  (COZAAR ) 100 MG tablet Take 1 tablet (100 mg total) by mouth daily. 90 tablet 3   metFORMIN  (GLUCOPHAGE -XR) 500 MG 24 hr tablet Take 2 tablets (1,000 mg total) by mouth 2 (two) times daily with a meal. 360 tablet 3   nebivolol  (BYSTOLIC ) 10 MG tablet Take 1 tablet (10 mg total) by mouth in the morning and at bedtime. 180 tablet 3   rOPINIRole  (REQUIP ) 0.25 MG tablet TAKE 2 TABLETS(0.5 MG) BY MOUTH AT BEDTIME AS NEEDED (Patient taking differently: Take 0.25 mg by mouth at bedtime.) 180 tablet 0   rosuvastatin  (CRESTOR ) 5 MG tablet TAKE 1 TABLET BY MOUTH EVERY MONDAY, WEDNESDAY AND FRIDAY 39 tablet 2   Semaglutide , 2 MG/DOSE, (OZEMPIC , 2 MG/DOSE,) 8 MG/3ML SOPN Inject 2 mg into the skin once a week. 9 mL 3   SM LANCETS 33G MISC USE TO TEST BLOOD SUGAR TWICE DAILY 400 each PRN   tadalafil  (CIALIS ) 5 MG tablet Take 0.5 tablets (2.5 mg total) by mouth daily as needed for erectile dysfunction (for urinary dx). (Patient taking differently: Take 2.5 mg by mouth daily as needed (for urinary dx). BPH) 45 tablet 3   zolpidem  (AMBIEN ) 5 MG tablet TAKE 1 TABLET(5 MG) BY MOUTH AT BEDTIME AS NEEDED FOR SLEEP 30 tablet 1   No current  facility-administered medications on file prior to visit.    Review of Systems:  As per HPI- otherwise negative.   Physical Examination: There were no vitals filed for this visit. There were no vitals filed for this visit. There is no height or weight on file to calculate BMI. Ideal Body Weight:    GEN: no acute distress. HEENT: Atraumatic, Normocephalic.  Ears and Nose: No external deformity. CV: RRR, No M/G/R. No JVD. No thrill. No extra heart sounds. PULM: CTA B, no wheezes, crackles, rhonchi. No retractions. No resp. distress. No accessory muscle use. ABD: S, NT, ND, +BS. No rebound. No HSM. EXTR: No c/c/e PSYCH: Normally interactive. Conversant.    Assessment and Plan: No diagnosis found.  Assessment & Plan   Signed Harlene Schroeder, MD

## 2024-01-22 ENCOUNTER — Ambulatory Visit: Admitting: Family Medicine

## 2024-01-22 ENCOUNTER — Encounter: Payer: Self-pay | Admitting: Family Medicine

## 2024-01-22 VITALS — BP 142/84 | HR 62 | Ht 72.0 in | Wt 227.0 lb

## 2024-01-22 DIAGNOSIS — Z8739 Personal history of other diseases of the musculoskeletal system and connective tissue: Secondary | ICD-10-CM

## 2024-01-22 DIAGNOSIS — E1140 Type 2 diabetes mellitus with diabetic neuropathy, unspecified: Secondary | ICD-10-CM

## 2024-01-22 DIAGNOSIS — Z7184 Encounter for health counseling related to travel: Secondary | ICD-10-CM | POA: Diagnosis not present

## 2024-01-22 DIAGNOSIS — Z23 Encounter for immunization: Secondary | ICD-10-CM

## 2024-01-22 MED ORDER — DOXYCYCLINE HYCLATE 100 MG PO CAPS: ORAL_CAPSULE | ORAL | 0 refills | Status: DC

## 2024-01-22 NOTE — Patient Instructions (Signed)
 Great to see you today! Meningitis booster Covid booster at your convenience I will be in touch with your labs We can change over from ozempic  to mounjaro  if needed

## 2024-01-23 ENCOUNTER — Encounter: Payer: Self-pay | Admitting: Family Medicine

## 2024-01-23 ENCOUNTER — Ambulatory Visit: Payer: Self-pay | Admitting: Family Medicine

## 2024-01-23 DIAGNOSIS — E1140 Type 2 diabetes mellitus with diabetic neuropathy, unspecified: Secondary | ICD-10-CM

## 2024-01-23 LAB — URIC ACID: Uric Acid, Serum: 4 mg/dL (ref 4.0–7.8)

## 2024-01-23 LAB — COMPREHENSIVE METABOLIC PANEL WITH GFR
ALT: 19 U/L (ref 0–53)
AST: 14 U/L (ref 0–37)
Albumin: 4.4 g/dL (ref 3.5–5.2)
Alkaline Phosphatase: 56 U/L (ref 39–117)
BUN: 22 mg/dL (ref 6–23)
CO2: 29 meq/L (ref 19–32)
Calcium: 10.5 mg/dL (ref 8.4–10.5)
Chloride: 103 meq/L (ref 96–112)
Creatinine, Ser: 1.01 mg/dL (ref 0.40–1.50)
GFR: 73 mL/min (ref >60.00)
Glucose, Bld: 171 mg/dL — ABNORMAL HIGH (ref 70–99)
Potassium: 4.3 meq/L (ref 3.5–5.1)
Sodium: 139 meq/L (ref 135–145)
Total Bilirubin: 0.4 mg/dL (ref 0.2–1.2)
Total Protein: 6.8 g/dL (ref 6.0–8.3)

## 2024-01-23 LAB — MEASLES/MUMPS/RUBELLA IMMUNITY
Mumps IgG: >300 [AU]/ml
Rubella: 10.7 {index}
Rubeola IgG: >300 [AU]/ml

## 2024-01-23 LAB — HEMOGLOBIN A1C: Hgb A1c MFr Bld: 8.9 % — ABNORMAL HIGH (ref 4.6–6.5)

## 2024-01-24 MED ORDER — TIRZEPATIDE 7.5 MG/0.5ML ~~LOC~~ SOAJ: 7.5000 mg | SUBCUTANEOUS | 1 refills | Status: DC

## 2024-02-19 ENCOUNTER — Encounter: Payer: Self-pay | Admitting: Family Medicine

## 2024-02-19 DIAGNOSIS — R053 Chronic cough: Secondary | ICD-10-CM | POA: Diagnosis not present

## 2024-02-19 DIAGNOSIS — H6502 Acute serous otitis media, left ear: Secondary | ICD-10-CM | POA: Diagnosis not present

## 2024-02-19 DIAGNOSIS — H6992 Unspecified Eustachian tube disorder, left ear: Secondary | ICD-10-CM | POA: Diagnosis not present

## 2024-02-19 DIAGNOSIS — J300 Vasomotor rhinitis: Secondary | ICD-10-CM | POA: Diagnosis not present

## 2024-02-19 MED ORDER — TIRZEPATIDE 10 MG/0.5ML ~~LOC~~ SOAJ: 10.0000 mg | SUBCUTANEOUS | 1 refills | Status: AC

## 2024-02-20 LAB — PROTEIN / CREATININE RATIO, URINE: Creatinine, Urine: 0.66

## 2024-02-20 LAB — COMPREHENSIVE METABOLIC PANEL WITH GFR: eGFR: 90

## 2024-02-26 ENCOUNTER — Other Ambulatory Visit: Payer: Self-pay | Admitting: Medical Genetics

## 2024-02-26 DIAGNOSIS — S8012XA Contusion of left lower leg, initial encounter: Secondary | ICD-10-CM | POA: Diagnosis not present

## 2024-02-26 DIAGNOSIS — M898X6 Other specified disorders of bone, lower leg: Secondary | ICD-10-CM | POA: Diagnosis not present

## 2024-02-26 DIAGNOSIS — Z006 Encounter for examination for normal comparison and control in clinical research program: Secondary | ICD-10-CM

## 2024-03-12 ENCOUNTER — Other Ambulatory Visit: Payer: Self-pay | Admitting: Family Medicine

## 2024-03-12 DIAGNOSIS — I10 Essential (primary) hypertension: Secondary | ICD-10-CM

## 2024-03-12 DIAGNOSIS — E119 Type 2 diabetes mellitus without complications: Secondary | ICD-10-CM

## 2024-03-18 ENCOUNTER — Telehealth: Payer: Self-pay | Admitting: Family Medicine

## 2024-03-18 NOTE — Telephone Encounter (Signed)
 Copied from CRM #[id number]. Topic: Medicare AWV >> Mar 18, 2024  3:02 PM Nathanel DEL wrote: Called LVM 03/18/2024 to sched AWV. Please schedule in office or virtual visit.   Nathanel Paschal; Care Guide Ambulatory Clinical Support [place] [facility] Direct Dial: [phone]

## 2024-03-21 ENCOUNTER — Other Ambulatory Visit: Payer: Self-pay | Admitting: Family Medicine

## 2024-03-21 DIAGNOSIS — Z8673 Personal history of transient ischemic attack (TIA), and cerebral infarction without residual deficits: Secondary | ICD-10-CM

## 2024-03-29 ENCOUNTER — Other Ambulatory Visit: Payer: Self-pay | Admitting: Family Medicine

## 2024-03-29 DIAGNOSIS — E119 Type 2 diabetes mellitus without complications: Secondary | ICD-10-CM

## 2024-04-03 ENCOUNTER — Telehealth: Payer: Self-pay | Admitting: Neurology

## 2024-04-03 NOTE — Telephone Encounter (Signed)
 Request to reschedule appointment Due to conflict at work

## 2024-04-04 ENCOUNTER — Ambulatory Visit

## 2024-04-04 ENCOUNTER — Telehealth: Payer: Self-pay

## 2024-04-04 NOTE — Telephone Encounter (Signed)
 Unsuccessful attempts to reach patient on preferred number listed in notes for scheduled AWV. Left message on voicemail okay to reschedule.

## 2024-04-10 ENCOUNTER — Ambulatory Visit: Payer: Medicare Other | Admitting: Neurology

## 2024-04-29 ENCOUNTER — Telehealth: Payer: Self-pay | Admitting: Family Medicine

## 2024-04-29 NOTE — Telephone Encounter (Signed)
 Copied from CRM #[id number]. Topic: Medicare AWV >> Apr 29, 2024 11:08 AM Nathanel DEL wrote: Called LVM 04/29/2024 to sched AWVI. Please schedule AWVI in office.   Nathanel Paschal; Care Guide Ambulatory Clinical Support [place] [facility] Direct Dial: [phone]

## 2024-05-08 ENCOUNTER — Encounter: Payer: Self-pay | Admitting: Family Medicine

## 2024-05-08 MED ORDER — OSELTAMIVIR PHOSPHATE 75 MG PO CAPS: 75.0000 mg | ORAL_CAPSULE | Freq: Every day | ORAL | 0 refills | Status: DC

## 2024-05-22 NOTE — Progress Notes (Unsigned)
 SABRA

## 2024-05-23 ENCOUNTER — Ambulatory Visit: Payer: Medicare (Managed Care) | Admitting: Neurology

## 2024-05-23 ENCOUNTER — Encounter: Payer: Self-pay | Admitting: Neurology

## 2024-05-23 VITALS — BP 151/81 | HR 64 | Ht 72.0 in | Wt 227.0 lb

## 2024-05-23 DIAGNOSIS — Z7984 Long term (current) use of oral hypoglycemic drugs: Secondary | ICD-10-CM | POA: Diagnosis not present

## 2024-05-23 DIAGNOSIS — I4729 Other ventricular tachycardia: Secondary | ICD-10-CM

## 2024-05-23 DIAGNOSIS — E1140 Type 2 diabetes mellitus with diabetic neuropathy, unspecified: Secondary | ICD-10-CM

## 2024-05-23 DIAGNOSIS — G4733 Obstructive sleep apnea (adult) (pediatric): Secondary | ICD-10-CM | POA: Diagnosis not present

## 2024-05-23 DIAGNOSIS — J449 Chronic obstructive pulmonary disease, unspecified: Secondary | ICD-10-CM
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
## 2024-05-27 ENCOUNTER — Ambulatory Visit: Admitting: Family Medicine

## 2024-05-28 NOTE — Progress Notes (Unsigned)
 [facility] [address], [address] [place], [place] [address] [phone]  Date:  05/29/2024   Name:  Phillip [name]   DOB:  09/14/1950   MRN:  [id number]  PCP:  Phillip Harlene BROCKS, MD    Chief Complaint: No chief complaint on file.   History of Present Illness:  Phillip [name] is a 74 y.o. very pleasant male patient who presents with the following:  Patient seen today for periodic follow-up.  I saw him most recently in September History of diabetes, sleep apnea on CPAP, diabetic neuropathy, CAD, migraine headache, ischemic stroke in 2014 with no residual deficit. He saw his urologist, Dr. Watt in June to review PSA that we drew in May   Current diabetes treatment: Farxiga  10, metformin  1000 twice daily, Lantus , Ozempic  2 mg Also Crestor , tadalafil , Bystolic , losartan , ropinirole , HCTZ, gabapentin , Plavix   Most recent A1c checked in September was 8.9-we increased his dose of Mounjaro  at that time  Due for urine micro Can update A1c Can update foot exam if he would like  Discussed the use of AI scribe software for clinical note transcription with the patient, who gave verbal consent to proceed.  History of Present Illness     Patient Active Problem List   Diagnosis Date Noted   RLS (restless legs syndrome) 04/11/2023   Atrial tachycardia 04/22/2022   Palpitations 02/18/2022   Dependence on CPAP ventilation 07/01/2020   Type 2 diabetes mellitus with diabetic neuropathy, without long-term current use of insulin  (HCC) 09/02/2019   OSA and COPD overlap syndrome (HCC) 09/02/2019   Abnormal hemoglobin 09/02/2019   OSA on CPAP 08/12/2015   Ventricular tachycardia (HCC) 11/10/2014   CAD (coronary artery disease), native coronary artery    Migraine variant 08/14/2013   History of ischemic stroke without residual deficits 04/17/2013   ED (erectile dysfunction) 02/01/2012   Gout 09/05/2011   Hyperlipidemia 09/05/2011    Hypertriglyceridemia 09/05/2011   PVC (premature ventricular contraction) 09/05/2011   Essential hypertension, benign 06/01/2011    Past Medical History:  Diagnosis Date   Allergy 1955   Sulfa   Anemia    pt. denies   Arthritis    Diabetes mellitus    type 2   Gout    Hyperlipidemia    Hypertension    Localized osteoarthritis of left knee 11/20/2012   Migraine headache with aura    rarely   Neuromuscular disorder (HCC)     mild periferal neauropathy   Osteoarthritis of left hip 11/20/2012   Pneumonia    PVC (premature ventricular contraction)    Sleep apnea    cpap   Stroke (HCC) 04/2013   no residuals    Past Surgical History:  Procedure Laterality Date   CHOLECYSTECTOMY  05/03/1995   COLONOSCOPY WITH PROPOFOL  N/A 08/18/2023   Procedure: COLONOSCOPY WITH PROPOFOL ;  Surgeon: Rollin Dover, MD;  Location: [facility];  Service: Gastroenterology;  Laterality: N/A;   CVA     CVA, occiptal   CVA     CVA, occipital   knee arthoscopy  05/02/2004   Left Knee   LEFT HEART CATH AND CORONARY ANGIOGRAPHY N/A 12/07/2017   Procedure: LEFT HEART CATH AND CORONARY ANGIOGRAPHY;  Surgeon: Burnard Debby LABOR, MD;  Location: [facility];  Service: Cardiovascular;  Laterality: N/A;   LOOP RECORDER IMPLANT N/A 04/19/2013   Procedure: LOOP RECORDER IMPLANT;  Surgeon: Danelle LELON Birmingham, MD;  Location: [facility] CATH LAB;  Service: Cardiovascular;  Laterality: N/A;   LOOP RECORDER REMOVAL     PARTIAL KNEE ARTHROPLASTY Left 11/20/2012   Procedure: UNICOMPARTMENTAL KNEE;  Surgeon: Fonda SHAUNNA Olmsted, MD;  Location: [facility] OR;  Service: Orthopedics;  Laterality: Left;   POLYPECTOMY  08/18/2023   Procedure: POLYPECTOMY, INTESTINE;  Surgeon: Rollin Dover, MD;  Location: [facility];  Service: Gastroenterology;;   REPLACEMENT UNICONDYLAR JOINT KNEE Left 11/20/2012   Dr Olmsted   TEE WITHOUT CARDIOVERSION N/A 04/19/2013   Procedure: TRANSESOPHAGEAL ECHOCARDIOGRAM (TEE);  Surgeon: Ezra GORMAN Shuck, MD;   Location: [facility] ENDOSCOPY;  Service: Cardiovascular;  Laterality: N/A;   TONSILLECTOMY AND ADENOIDECTOMY     Childhood   TOTAL HIP ARTHROPLASTY Left 11/20/2012   Procedure: TOTAL HIP ARTHROPLASTY;  Surgeon: Fonda SHAUNNA Olmsted, MD;  Location: [facility] OR;  Service: Orthopedics;  Laterality: Left;   WISDOM TOOTH EXTRACTION      Social History[1]  Family History  Problem Relation Age of Onset   Cancer Mother        Breast   Stroke Mother    Diabetes Mother    Diabetes Father    Hearing loss Father    Stroke Maternal Grandmother    Stroke Maternal Aunt    Stroke Maternal Uncle    Diabetes Sister    Hypertension Sister    Diabetes Brother    Hearing loss Brother    Heart disease Brother    Hypertension Brother    Intellectual disability Daughter    Learning disabilities Daughter    Obesity Daughter    Varicose Veins Daughter    Miscarriages / Stillbirths Daughter    Miscarriages / Stillbirths Daughter     Allergies[2]  Medication list has been reviewed and updated.  Medications Ordered Prior to Encounter[3]  Review of Systems:  As per HPI- otherwise negative.   Physical Examination: There were no vitals filed for this visit. There were no vitals filed for this visit. There is no height or weight on file to calculate BMI. Ideal Body Weight:    GEN: no acute distress. HEENT: Atraumatic, Normocephalic.  Ears and Nose: No external deformity. CV: RRR, No M/G/R. No JVD. No thrill. No extra heart sounds. PULM: CTA B, no wheezes, crackles, rhonchi. No retractions. No resp. distress. No accessory muscle use. ABD: S, NT, ND, +BS. No rebound. No HSM. EXTR: No c/c/e PSYCH: Normally interactive. Conversant.    Assessment and Plan: No diagnosis found.  Assessment & Plan   Signed Harlene Schroeder, MD    [1]  Social History Tobacco Use   Smoking status: Never   Smokeless tobacco: Never  Vaping Use   Vaping status: Never Used  Substance Use Topics   Alcohol  use: No     Alcohol /week: 0.0 standard drinks of alcohol    Drug use: Never  [2]  Allergies Allergen Reactions   Sulfa Antibiotics Rash and Other (See Comments)    Reaction not confirmed, but a possibility (from childhood)  [3]  Current Outpatient Medications on File Prior to Visit  Medication Sig Dispense Refill   acetic acid -hydrocortisone  (VOSOL -HC) OTIC solution Place 3 drops into both ears 2 (two) times daily. Use as needed for ear itching 10 mL 3   Alcohol  Swabs  PADS Use to test up to 4 times daily 100 each 2   ALEVE 220 MG tablet Take 220-440 mg by mouth 2 (two) times daily as needed (for headaches).     allopurinol  (ZYLOPRIM ) 300 MG tablet Take 150 mg by mouth daily.  blood glucose meter kit and supplies KIT Pt needs One Touch Ultra glucometer.  Use up to BID to check blood sugars 1 each 0   clopidogrel  (PLAVIX ) 75 MG tablet TAKE 1 TABLET(75 MG) BY MOUTH DAILY 90 tablet 1   dapagliflozin  propanediol (FARXIGA ) 10 MG TABS tablet Take 1 tablet (10 mg total) by mouth daily. 90 tablet 3   diclofenac sodium (VOLTAREN) 1 % GEL Apply 2-4 g topically 2 (two) times daily as needed (for pain).     gabapentin  (NEURONTIN ) 300 MG capsule Take 1 capsule (300 mg total) by mouth 3 (three) times daily. 270 capsule 1   glucose blood (ONETOUCH ULTRA) test strip USE TO TEST UP TO FOUR TIMES DAILY (Patient taking differently: in the morning, at noon, in the evening, and at bedtime.) 200 strip 12   hydrochlorothiazide  (MICROZIDE ) 12.5 MG capsule Take 1 capsule (12.5 mg total) by mouth daily. 90 capsule 1   Insulin  Pen Needle (PEN NEEDLES) 32G X 5 MM MISC 1 each by Does not apply route daily. Use for basal insulin  injection 100 each 6   LANTUS  SOLOSTAR 100 UNIT/ML Solostar Pen ADMINISTER 30 UNITS UNDER THE SKIN DAILY 15 mL 3   losartan  (COZAAR ) 100 MG tablet Take 1 tablet (100 mg total) by mouth daily. 90 tablet 1   metFORMIN  (GLUCOPHAGE -XR) 500 MG 24 hr tablet Take 2 tablets (1,000 mg total) by mouth 2 (two) times  daily with a meal. 360 tablet 1   nebivolol  (BYSTOLIC ) 10 MG tablet Take 1 tablet (10 mg total) by mouth in the morning and at bedtime. 180 tablet 3   rOPINIRole  (REQUIP ) 0.25 MG tablet TAKE 2 TABLETS(0.5 MG) BY MOUTH AT BEDTIME AS NEEDED (Patient taking differently: Take 0.25 mg by mouth at bedtime.) 180 tablet 0   rosuvastatin  (CRESTOR ) 5 MG tablet TAKE 1 TABLET BY MOUTH EVERY MONDAY, WEDNESDAY AND FRIDAY 39 tablet 2   SM LANCETS 33G MISC USE TO TEST BLOOD SUGAR TWICE DAILY 400 each PRN   tadalafil  (CIALIS ) 5 MG tablet Take 0.5 tablets (2.5 mg total) by mouth daily as needed for erectile dysfunction (for urinary dx). (Patient taking differently: Take 2.5 mg by mouth daily as needed (for urinary dx). BPH) 45 tablet 3   tirzepatide  (MOUNJARO ) 10 MG/0.5ML Pen Inject 10 mg into the skin once a week. 6 mL 1   zolpidem  (AMBIEN ) 5 MG tablet TAKE 1 TABLET(5 MG) BY MOUTH AT BEDTIME AS NEEDED FOR SLEEP 30 tablet 1   No current facility-administered medications on file prior to visit.   "

## 2024-05-28 NOTE — Patient Instructions (Incomplete)
 It was great to see you today, I will be in touch with your labs.  Please see me in about 6 months We can adjust the mounjaro  if needed based on your A1c Let me know is the thumb is not continuing to heal up ok  Let's try the hydrochlorothiazide  25 mg for your BP and minor swelling - can go back down to 12.5 if you start to feel lightheaded

## 2024-05-29 ENCOUNTER — Encounter: Payer: Self-pay | Admitting: Pharmacist

## 2024-05-29 ENCOUNTER — Ambulatory Visit (INDEPENDENT_AMBULATORY_CARE_PROVIDER_SITE_OTHER): Payer: Medicare (Managed Care) | Admitting: Family Medicine

## 2024-05-29 ENCOUNTER — Encounter: Payer: Self-pay | Admitting: Family Medicine

## 2024-05-29 VITALS — BP 138/80 | HR 68 | Ht 73.0 in | Wt 228.4 lb

## 2024-05-29 DIAGNOSIS — G8929 Other chronic pain: Secondary | ICD-10-CM | POA: Diagnosis not present

## 2024-05-29 DIAGNOSIS — Z7985 Long-term (current) use of injectable non-insulin antidiabetic drugs: Secondary | ICD-10-CM

## 2024-05-29 DIAGNOSIS — I10 Essential (primary) hypertension: Secondary | ICD-10-CM | POA: Diagnosis not present

## 2024-05-29 DIAGNOSIS — M533 Sacrococcygeal disorders, not elsewhere classified: Secondary | ICD-10-CM

## 2024-05-29 DIAGNOSIS — E119 Type 2 diabetes mellitus without complications: Secondary | ICD-10-CM

## 2024-05-29 DIAGNOSIS — Z7984 Long term (current) use of oral hypoglycemic drugs: Secondary | ICD-10-CM

## 2024-05-29 DIAGNOSIS — E782 Mixed hyperlipidemia: Secondary | ICD-10-CM

## 2024-05-29 MED ORDER — HYDROCHLOROTHIAZIDE 25 MG PO TABS: 25.0000 mg | ORAL_TABLET | Freq: Every day | ORAL | 3 refills | Status: AC

## 2024-05-29 NOTE — Progress Notes (Signed)
 Pharmacy Quality Measure Review  This patient is appearing on the insurance-provided list for being at risk of failing the adherence measure for Statin Use in Persons with Diabetes (SUPD) medications this calendar year.  Reviewed patient's med list and he has rosuvastatin  on his list.  Reviewed refill history and he filled rosuvastatin  twice in 2025 and filled on time.   Dispenses   Dispensed Days Supply Quantity Provider Pharmacy  ROSUVASTATIN  5MG  TABLETS 03/22/2024 90 39 each Copland, Harlene BROCKS, MD [facility]...  ROSUVASTATIN  5MG  TABLETS 12/25/2023 90 39 each Copland, Harlene BROCKS, MD [facility] #...      I called [facility] and verified his copay 03/22/2024 was $0 and patient did pick up Rx. I was run thru his [employer] plan.   Really not sure why patient is showing as failed SUPD in 2025 but will continue to follow in 2026. He does have new Medicare plan with [employer] for 2026.   Madelin Ray, PharmD Clinical Pharmacist [facility]

## 2024-05-30 ENCOUNTER — Encounter: Payer: Self-pay | Admitting: Family Medicine

## 2024-05-30 LAB — LIPID PANEL
Cholesterol: 119 mg/dL (ref 28–200)
HDL: 44.3 mg/dL
LDL Cholesterol: 30 mg/dL (ref 10–99)
NonHDL: 74.61
Total CHOL/HDL Ratio: 3
Triglycerides: 221 mg/dL — ABNORMAL HIGH (ref 10.0–149.0)
VLDL: 44.2 mg/dL — ABNORMAL HIGH (ref 0.0–40.0)

## 2024-05-30 LAB — BASIC METABOLIC PANEL WITH GFR
BUN: 24 mg/dL — ABNORMAL HIGH (ref 6–23)
CO2: 27 meq/L (ref 19–32)
Calcium: 9.8 mg/dL (ref 8.4–10.5)
Chloride: 103 meq/L (ref 96–112)
Creatinine, Ser: 1.03 mg/dL (ref 0.40–1.50)
GFR: 71.13 mL/min
Glucose, Bld: 182 mg/dL — ABNORMAL HIGH (ref 70–99)
Potassium: 3.6 meq/L (ref 3.5–5.1)
Sodium: 136 meq/L (ref 135–145)

## 2024-05-30 LAB — MICROALBUMIN / CREATININE URINE RATIO
Creatinine,U: 37.6 mg/dL
Microalb Creat Ratio: UNDETERMINED mg/g (ref 0.0–30.0)
Microalb, Ur: <0.7 mg/dL

## 2024-05-30 LAB — HEMOGLOBIN A1C: Hgb A1c MFr Bld: 7.4 % — ABNORMAL HIGH (ref 4.6–6.5)

## 2024-06-03 ENCOUNTER — Ambulatory Visit: Payer: Medicare (Managed Care)

## 2024-06-03 ENCOUNTER — Telehealth: Payer: Self-pay | Admitting: Family Medicine

## 2024-06-03 NOTE — Telephone Encounter (Signed)
 Copied from CRM [id number]. Topic: Medicare AWV >> Jun 03, 2024  8:18 AM Nathanel DEL wrote: LVM 06/03/2024 change appt time due to inclement weather. New AWV appt 06/05/2024. Please confirm date change.  Nathanel Paschal; Care Guide Ambulatory Clinical Support [place] [facility] Direct Dial: [phone]

## 2024-06-04 ENCOUNTER — Other Ambulatory Visit: Payer: Self-pay | Admitting: Family Medicine

## 2024-06-04 DIAGNOSIS — E1140 Type 2 diabetes mellitus with diabetic neuropathy, unspecified: Secondary | ICD-10-CM

## 2024-06-05 ENCOUNTER — Ambulatory Visit: Payer: Medicare (Managed Care)

## 2024-06-05 ENCOUNTER — Encounter: Payer: Self-pay | Admitting: Family Medicine

## 2024-06-05 DIAGNOSIS — E1140 Type 2 diabetes mellitus with diabetic neuropathy, unspecified: Secondary | ICD-10-CM

## 2024-06-05 MED ORDER — GABAPENTIN 300 MG PO CAPS: ORAL_CAPSULE | ORAL | 3 refills | Status: AC

## 2024-06-07 ENCOUNTER — Other Ambulatory Visit: Payer: Self-pay | Admitting: Family Medicine

## 2024-06-07 DIAGNOSIS — E119 Type 2 diabetes mellitus without complications: Secondary | ICD-10-CM

## 2024-06-07 NOTE — Progress Notes (Signed)
 Phillip [name]                                          MRN: [id number]   06/07/2024   The [facility] Quality Team Specialist reviewed this patient medical record for the purposes of chart review for care gap closure. The following were reviewed: chart review for care gap closure-controlling blood pressure.    [facility] Quality Team

## 2024-06-12 ENCOUNTER — Ambulatory Visit: Payer: Medicare (Managed Care)

## 2024-09-26 ENCOUNTER — Ambulatory Visit: Payer: Medicare (Managed Care) | Admitting: Family Medicine

## 2025-05-26 ENCOUNTER — Ambulatory Visit: Payer: Medicare (Managed Care) | Admitting: Neurology
